# Patient Record
Sex: Male | Born: 1955 | Race: White | Hispanic: No | Smoking: Former smoker
Health system: Southern US, Community
[De-identification: ages and names within clinical notes are randomized; demographics above are authoritative.]

## PROBLEM LIST (undated history)

## (undated) DIAGNOSIS — I5022 Chronic systolic (congestive) heart failure: Secondary | ICD-10-CM

## (undated) DIAGNOSIS — J45909 Unspecified asthma, uncomplicated: Secondary | ICD-10-CM

## (undated) DIAGNOSIS — J449 Chronic obstructive pulmonary disease, unspecified: Secondary | ICD-10-CM

## (undated) DIAGNOSIS — I10 Essential (primary) hypertension: Secondary | ICD-10-CM

## (undated) DIAGNOSIS — I110 Hypertensive heart disease with heart failure: Secondary | ICD-10-CM

## (undated) DIAGNOSIS — I214 Non-ST elevation (NSTEMI) myocardial infarction: Secondary | ICD-10-CM

## (undated) HISTORY — PX: CATARACT EXTRACTION, BILATERAL: SHX1313

## (undated) HISTORY — DX: Chronic systolic (congestive) heart failure: I50.22

## (undated) HISTORY — DX: Hypertensive heart disease with heart failure: I11.0

---

## 2005-06-26 ENCOUNTER — Other Ambulatory Visit: Payer: Self-pay

## 2005-06-27 ENCOUNTER — Observation Stay: Payer: Self-pay

## 2015-06-21 ENCOUNTER — Inpatient Hospital Stay: Payer: Self-pay | Admitting: Anesthesiology

## 2015-06-21 ENCOUNTER — Encounter: Payer: Self-pay | Admitting: Intensive Care

## 2015-06-21 ENCOUNTER — Inpatient Hospital Stay
Admission: EM | Admit: 2015-06-21 | Discharge: 2015-06-23 | DRG: 378 | Disposition: A | Discharge status: Home / Self Care | Payer: Self-pay | Attending: Internal Medicine | Admitting: Internal Medicine

## 2015-06-21 ENCOUNTER — Encounter
Admission: EM | Disposition: A | Discharge status: Home / Self Care | Payer: Self-pay | Source: Home / Self Care | Attending: Internal Medicine

## 2015-06-21 ENCOUNTER — Emergency Department: Payer: Self-pay

## 2015-06-21 DIAGNOSIS — D6959 Other secondary thrombocytopenia: Secondary | ICD-10-CM | POA: Diagnosis present

## 2015-06-21 DIAGNOSIS — F1010 Alcohol abuse, uncomplicated: Secondary | ICD-10-CM

## 2015-06-21 DIAGNOSIS — K922 Gastrointestinal hemorrhage, unspecified: Secondary | ICD-10-CM | POA: Diagnosis present

## 2015-06-21 DIAGNOSIS — K2100 Gastro-esophageal reflux disease with esophagitis, without bleeding: Secondary | ICD-10-CM

## 2015-06-21 DIAGNOSIS — Z7952 Long term (current) use of systemic steroids: Secondary | ICD-10-CM

## 2015-06-21 DIAGNOSIS — D62 Acute posthemorrhagic anemia: Secondary | ICD-10-CM

## 2015-06-21 DIAGNOSIS — I10 Essential (primary) hypertension: Secondary | ICD-10-CM | POA: Diagnosis present

## 2015-06-21 DIAGNOSIS — Z87891 Personal history of nicotine dependence: Secondary | ICD-10-CM

## 2015-06-21 DIAGNOSIS — D696 Thrombocytopenia, unspecified: Secondary | ICD-10-CM

## 2015-06-21 DIAGNOSIS — Z885 Allergy status to narcotic agent status: Secondary | ICD-10-CM

## 2015-06-21 DIAGNOSIS — D500 Iron deficiency anemia secondary to blood loss (chronic): Secondary | ICD-10-CM | POA: Diagnosis present

## 2015-06-21 DIAGNOSIS — Z720 Tobacco use: Secondary | ICD-10-CM

## 2015-06-21 DIAGNOSIS — K2970 Gastritis, unspecified, without bleeding: Secondary | ICD-10-CM

## 2015-06-21 DIAGNOSIS — Z79899 Other long term (current) drug therapy: Secondary | ICD-10-CM

## 2015-06-21 DIAGNOSIS — K2971 Gastritis, unspecified, with bleeding: Secondary | ICD-10-CM | POA: Diagnosis present

## 2015-06-21 DIAGNOSIS — K264 Chronic or unspecified duodenal ulcer with hemorrhage: Principal | ICD-10-CM | POA: Diagnosis present

## 2015-06-21 DIAGNOSIS — J45909 Unspecified asthma, uncomplicated: Secondary | ICD-10-CM | POA: Diagnosis present

## 2015-06-21 DIAGNOSIS — K269 Duodenal ulcer, unspecified as acute or chronic, without hemorrhage or perforation: Secondary | ICD-10-CM

## 2015-06-21 DIAGNOSIS — D72829 Elevated white blood cell count, unspecified: Secondary | ICD-10-CM

## 2015-06-21 HISTORY — PX: ESOPHAGOGASTRODUODENOSCOPY (EGD) WITH PROPOFOL: SHX5813

## 2015-06-21 HISTORY — DX: Essential (primary) hypertension: I10

## 2015-06-21 HISTORY — DX: Unspecified asthma, uncomplicated: J45.909

## 2015-06-21 LAB — CBC
HEMATOCRIT: 37 % — AB (ref 40.0–52.0)
HEMATOCRIT: 32.7 % — AB (ref 40.0–52.0)
HEMOGLOBIN: 12.2 g/dL — AB (ref 13.0–18.0)
HEMOGLOBIN: 11 g/dL — AB (ref 13.0–18.0)
MCH: 31.1 pg (ref 26.0–34.0)
MCH: 31.4 pg (ref 26.0–34.0)
MCHC: 33.1 g/dL (ref 32.0–36.0)
MCHC: 33.6 g/dL (ref 32.0–36.0)
MCV: 94 fL (ref 80.0–100.0)
MCV: 93.4 fL (ref 80.0–100.0)
Platelets: 141 10*3/uL — ABNORMAL LOW (ref 150–440)
Platelets: 137 10*3/uL — ABNORMAL LOW (ref 150–440)
RBC: 3.94 MIL/uL — AB (ref 4.40–5.90)
RBC: 3.5 MIL/uL — ABNORMAL LOW (ref 4.40–5.90)
RDW: 14 % (ref 11.5–14.5)
RDW: 13.9 % (ref 11.5–14.5)
WBC: 10.4 10*3/uL (ref 3.8–10.6)
WBC: 8.6 10*3/uL (ref 3.8–10.6)

## 2015-06-21 LAB — COMPREHENSIVE METABOLIC PANEL
ALT: 26 U/L (ref 17–63)
AST: 50 U/L — ABNORMAL HIGH (ref 15–41)
Albumin: 3.3 g/dL — ABNORMAL LOW (ref 3.5–5.0)
Alkaline Phosphatase: 74 U/L (ref 38–126)
Anion gap: 16 — ABNORMAL HIGH (ref 5–15)
BUN: 23 mg/dL — AB (ref 6–20)
CHLORIDE: 95 mmol/L — AB (ref 101–111)
CO2: 24 mmol/L (ref 22–32)
Calcium: 8.4 mg/dL — ABNORMAL LOW (ref 8.9–10.3)
Creatinine, Ser: 0.65 mg/dL (ref 0.61–1.24)
GFR calc Af Amer: >60 mL/min (ref >60)
GFR calc non Af Amer: >60 mL/min (ref >60)
GLUCOSE: 128 mg/dL — AB (ref 65–99)
Potassium: 4.9 mmol/L (ref 3.5–5.1)
Sodium: 135 mmol/L (ref 135–145)
Total Bilirubin: 1 mg/dL (ref 0.3–1.2)
Total Protein: 6.5 g/dL (ref 6.5–8.1)

## 2015-06-21 LAB — TYPE AND SCREEN
ABO/RH(D): A NEG
ANTIBODY SCREEN: NEGATIVE

## 2015-06-21 LAB — URINALYSIS COMPLETE WITH MICROSCOPIC (ARMC ONLY)
BACTERIA UA: NONE SEEN
Bilirubin Urine: NEGATIVE
GLUCOSE, UA: NEGATIVE mg/dL
LEUKOCYTES UA: NEGATIVE
Nitrite: NEGATIVE
PH: 6 (ref 5.0–8.0)
Protein, ur: NEGATIVE mg/dL
SPECIFIC GRAVITY, URINE: 1.012 (ref 1.005–1.030)
SQUAMOUS EPITHELIAL / LPF: NONE SEEN

## 2015-06-21 LAB — PROTIME-INR
INR: 1.17
Prothrombin Time: 15.1 seconds — ABNORMAL HIGH (ref 11.4–15.0)

## 2015-06-21 LAB — CBC WITH DIFFERENTIAL/PLATELET
BASOS PCT: 0 %
Basophils Absolute: 0 10*3/uL (ref 0–0.1)
Basophils Absolute: 0 10*3/uL (ref 0–0.1)
Basophils Relative: 0 %
EOS ABS: 0 10*3/uL (ref 0–0.7)
EOS PCT: 0 %
Eosinophils Absolute: 0 10*3/uL (ref 0–0.7)
Eosinophils Relative: 0 %
HCT: 41.6 % (ref 40.0–52.0)
HEMATOCRIT: 36.6 % — AB (ref 40.0–52.0)
HEMOGLOBIN: 12.3 g/dL — AB (ref 13.0–18.0)
Hemoglobin: 13.8 g/dL (ref 13.0–18.0)
Lymphocytes Relative: 10 %
Lymphocytes Relative: 3 %
Lymphs Abs: 1.5 10*3/uL (ref 1.0–3.6)
Lymphs Abs: 0.4 10*3/uL — ABNORMAL LOW (ref 1.0–3.6)
MCH: 31.4 pg (ref 26.0–34.0)
MCH: 31.4 pg (ref 26.0–34.0)
MCHC: 33.2 g/dL (ref 32.0–36.0)
MCHC: 33.7 g/dL (ref 32.0–36.0)
MCV: 94.5 fL (ref 80.0–100.0)
MCV: 93.3 fL (ref 80.0–100.0)
MONOS PCT: 1 %
Monocytes Absolute: 0.7 10*3/uL (ref 0.2–1.0)
Monocytes Absolute: 0.2 10*3/uL (ref 0.2–1.0)
Monocytes Relative: 5 %
NEUTROS ABS: 12.2 10*3/uL — AB (ref 1.4–6.5)
NEUTROS PCT: 96 %
Neutro Abs: 12.5 10*3/uL — ABNORMAL HIGH (ref 1.4–6.5)
Neutrophils Relative %: 85 %
PLATELETS: 210 10*3/uL (ref 150–440)
Platelets: 155 10*3/uL (ref 150–440)
RBC: 4.41 MIL/uL (ref 4.40–5.90)
RBC: 3.93 MIL/uL — AB (ref 4.40–5.90)
RDW: 14.1 % (ref 11.5–14.5)
RDW: 14.2 % (ref 11.5–14.5)
WBC: 14.7 10*3/uL — ABNORMAL HIGH (ref 3.8–10.6)
WBC: 12.8 10*3/uL — AB (ref 3.8–10.6)

## 2015-06-21 LAB — MRSA PCR SCREENING: MRSA by PCR: NEGATIVE

## 2015-06-21 LAB — ETHANOL: ALCOHOL ETHYL (B): 82 mg/dL — AB (ref <5)

## 2015-06-21 LAB — ABO/RH: ABO/RH(D): A NEG

## 2015-06-21 LAB — LIPASE, BLOOD: LIPASE: 33 U/L (ref 11–51)

## 2015-06-21 LAB — GLUCOSE, CAPILLARY: GLUCOSE-CAPILLARY: 100 mg/dL — AB (ref 65–99)

## 2015-06-21 LAB — APTT: APTT: 30 s (ref 24–36)

## 2015-06-21 SURGERY — ESOPHAGOGASTRODUODENOSCOPY (EGD) WITH PROPOFOL
Anesthesia: General

## 2015-06-21 MED ORDER — ONDANSETRON HCL 4 MG/2ML IJ SOLN
INTRAMUSCULAR | Status: AC
  Administered 2015-06-21: 4 mg via INTRAVENOUS
  Filled 2015-06-21: qty 2

## 2015-06-21 MED ORDER — ALBUTEROL SULFATE (2.5 MG/3ML) 0.083% IN NEBU
3.0000 mL | INHALATION_SOLUTION | Freq: Two times a day (BID) | RESPIRATORY_TRACT | Status: DC
  Administered 2015-06-21 – 2015-06-22 (×4): 3 mL via RESPIRATORY_TRACT
  Filled 2015-06-21 (×5): qty 3

## 2015-06-21 MED ORDER — PANTOPRAZOLE SODIUM 40 MG IV SOLR
8.0000 mg/h | INTRAVENOUS | Status: DC
  Administered 2015-06-21 – 2015-06-22 (×2): 8 mg/h via INTRAVENOUS
  Filled 2015-06-21 (×3): qty 80

## 2015-06-21 MED ORDER — ONDANSETRON HCL 4 MG/2ML IJ SOLN
4.0000 mg | Freq: Once | INTRAMUSCULAR | Status: AC
  Administered 2015-06-21: 4 mg via INTRAVENOUS

## 2015-06-21 MED ORDER — SODIUM CHLORIDE 0.9 % IV BOLUS (SEPSIS)
1000.0000 mL | Freq: Once | INTRAVENOUS | Status: AC
  Administered 2015-06-21: 1000 mL via INTRAVENOUS

## 2015-06-21 MED ORDER — SODIUM CHLORIDE 0.9 % IV SOLN
8.0000 mg/h | INTRAVENOUS | Status: DC
  Filled 2015-06-21: qty 80

## 2015-06-21 MED ORDER — SODIUM CHLORIDE 0.9 % IV SOLN
80.0000 mg | Freq: Once | INTRAVENOUS | Status: AC
  Administered 2015-06-21: 80 mg via INTRAVENOUS
  Filled 2015-06-21: qty 80

## 2015-06-21 MED ORDER — FOLIC ACID 1 MG PO TABS
1.0000 mg | ORAL_TABLET | Freq: Two times a day (BID) | ORAL | Status: DC
  Administered 2015-06-21 – 2015-06-23 (×4): 1 mg via ORAL
  Filled 2015-06-21 (×4): qty 1

## 2015-06-21 MED ORDER — SODIUM CHLORIDE 0.9 % IV SOLN
INTRAVENOUS | Status: DC
  Administered 2015-06-21 – 2015-06-22 (×3): via INTRAVENOUS
  Administered 2015-06-22: 1000 mL via INTRAVENOUS
  Administered 2015-06-23: 06:00:00 via INTRAVENOUS

## 2015-06-21 MED ORDER — ONDANSETRON HCL 4 MG/2ML IJ SOLN
4.0000 mg | Freq: Once | INTRAMUSCULAR | Status: AC
  Administered 2015-06-21: 4 mg via INTRAVENOUS
  Filled 2015-06-21: qty 2

## 2015-06-21 MED ORDER — PROPOFOL 10 MG/ML IV BOLUS
INTRAVENOUS | Status: DC | PRN
  Administered 2015-06-21: 100 mg via INTRAVENOUS

## 2015-06-21 NOTE — ED Notes (Signed)
Patient [place]. Patient c/o vomiting up blood and black stools. Patient A&O X4. Patient also states chest hurts when coughing

## 2015-06-21 NOTE — ED Notes (Signed)
MD at bedside for rectal exam. Patient positive hemoccult

## 2015-06-21 NOTE — Op Note (Signed)
[facility] Gastroenterology Patient Name: Caleb Carpenter Procedure Date: 06/21/2015 1:19 PM MRN: [id number] Account #: [id number] Date of Birth: 03/07/1956 Admit Type: Inpatient Age: 60 Room: [facility] ENDO ROOM 4 Gender: Male Note Status: Finalized Procedure:            Upper GI endoscopy Indications:          Hematochezia Providers:            Midge Minium, MD Referring MD:         No Local Md, MD (Referring MD) Medicines:            Propofol per Anesthesia Complications:        No immediate complications. Procedure:            Pre-Anesthesia Assessment:                       - Prior to the procedure, a History and Physical was                        performed, and patient medications and allergies were                        reviewed. The patient's tolerance of previous                        anesthesia was also reviewed. The risks and benefits of                        the procedure and the sedation options and risks were                        discussed with the patient. All questions were                        answered, and informed consent was obtained. Prior                        Anticoagulants: The patient has taken no previous                        anticoagulant or antiplatelet agents. ASA Grade                        Assessment: II - A patient with mild systemic disease.                        After reviewing the risks and benefits, the patient was                        deemed in satisfactory condition to undergo the                        procedure.                       After obtaining informed consent, the endoscope was                        passed under direct vision. Throughout the procedure,  the patient's blood pressure, pulse, and oxygen                        saturations were monitored continuously. The Endoscope                        was introduced through the mouth, and advanced to the                        second part of  duodenum. The upper GI endoscopy was                        accomplished without difficulty. The patient tolerated                        the procedure well. Findings:      LA Grade D (one or more mucosal breaks involving at least 75% of       esophageal circumference) esophagitis with no bleeding was found in the       lower third of the esophagus.      Localized moderate inflammation characterized by erythema was found in       the gastric antrum. Biopsies were taken with a cold forceps for       histology.      Two non-bleeding cratered duodenal ulcers with no stigmata of bleeding       were found in the first portion of the duodenum. Impression:           - LA Grade D reflux esophagitis.                       - Gastritis. Biopsied.                       - Multiple non-bleeding duodenal ulcers with no                        stigmata of bleeding. Recommendation:       - Await pathology results. Procedure Code(s):    --- Professional ---                       [phone], Esophagogastroduodenoscopy, flexible, transoral;                        with biopsy, single or multiple Diagnosis Code(s):    --- Professional ---                       K92.1, Melena (includes Hematochezia)                       K21.0, Gastro-esophageal reflux disease with esophagitis                       K29.70, Gastritis, unspecified, without bleeding                       K26.9, Duodenal ulcer, unspecified as acute or chronic,                        without hemorrhage or perforation CPT copyright 2016 American Medical Association. All rights reserved. The codes documented in this  report are preliminary and upon coder review may  be revised to meet current compliance requirements. Midge Minium, MD 06/21/2015 1:33:16 PM This report has been signed electronically. Number of Addenda: 0 Note Initiated On: 06/21/2015 1:19 PM      [facility]

## 2015-06-21 NOTE — Transfer of Care (Signed)
Immediate Anesthesia Transfer of Care Note  Patient: Caleb Carpenter  Procedure(s) Performed: Procedure(s): ESOPHAGOGASTRODUODENOSCOPY (EGD) WITH PROPOFOL (N/A)  Patient Location: PACU and Endoscopy Unit  Anesthesia Type:General  Level of Consciousness: patient cooperative and lethargic  Airway & Oxygen Therapy: Patient Spontanous Breathing and Patient connected to nasal cannula oxygen  Post-op Assessment: Report given to RN and Post -op Vital signs reviewed and stable  Post vital signs: Reviewed  Last Vitals:  Filed Vitals:   06/21/15 1337 06/21/15 1338  BP: 100/65 100/65  Pulse: 125   Temp: 36.9 C 36.8 C  Resp: 20 22    Complications: No apparent anesthesia complications

## 2015-06-21 NOTE — Anesthesia Preprocedure Evaluation (Addendum)
Anesthesia Evaluation  Patient identified by MRN, date of birth, ID band Patient awake    Reviewed: Allergy & Precautions, NPO status , Patient's Chart, lab work & pertinent test results, reviewed documented beta blocker date and time   Airway Mallampati: II  TM Distance: >3 FB     Dental  (+) Chipped, Edentulous Upper, Edentulous Lower   Pulmonary asthma , former smoker,           Cardiovascular hypertension, Pt. on medications and Pt. on home beta blockers      Neuro/Psych    GI/Hepatic   Endo/Other    Renal/GU      Musculoskeletal   Abdominal   Peds  Hematology   Anesthesia Other Findings CXR shows COPD. EKG poor R wave progression. OK.  Reproductive/Obstetrics                         Anesthesia Physical Anesthesia Plan  ASA: III  Anesthesia Plan: General   Post-op Pain Management:    Induction: Intravenous  Airway Management Planned: Nasal Cannula  Additional Equipment:   Intra-op Plan:   Post-operative Plan:   Informed Consent: I have reviewed the patients History and Physical, chart, labs and discussed the procedure including the risks, benefits and alternatives for the proposed anesthesia with the patient or authorized representative who has indicated his/her understanding and acceptance.     Plan Discussed with: CRNA  Anesthesia Plan Comments:         Anesthesia Quick Evaluation

## 2015-06-21 NOTE — ED Provider Notes (Signed)
[facility] Emergency Department Provider Note  ____________________________________________  Time seen: Approximately 7:27 AM  I have reviewed the triage vital signs and the nursing notes.   HISTORY  Chief Complaint Emesis    HPI Caleb Carpenter is a 60 y.o. male hypertension and asthma who presents for evaluation of bright red hematemesis and black tarry stools today, sudden onset, constant since onset, currently moderate. Patient reports that twice this morning he has vomited up bright red blood. He denies any abdominal pain but has some chest pain with cough. He reports he has been coughing "for a long time". No fevers. No shortness of breath. He is not chronically anticoagulated. No history of GI bleed. He does drink alcohol daily but denies any history of complicated withdrawals. He last drink alcohol at dinner last night.   Past Medical History  Diagnosis Date  . Hypertension   . Asthma     There are no active problems to display for this patient.   History reviewed. No pertinent past surgical history.  No current outpatient prescriptions on file.  Allergies Codeine  History reviewed. No pertinent family history.  Social History Social History  Substance Use Topics  . Smoking status: Former Smoker    Types: Cigarettes  . Smokeless tobacco: Current User    Types: Chew  . Alcohol Use: 1.2 oz/week    2 Cans of beer per week     Comment: per day    Review of Systems Constitutional: No fever/chills Eyes: No visual changes. ENT: No sore throat. Cardiovascular: + chest pain with cough. Respiratory: Denies shortness of breath. Gastrointestinal: No abdominal pain.  No nausea, no vomiting.  No diarrhea.  No constipation. Genitourinary: Negative for dysuria. Musculoskeletal: Negative for back pain. Skin: Negative for rash. Neurological: Negative for headaches, focal weakness or numbness.  10-point ROS otherwise  negative.  ____________________________________________   PHYSICAL EXAM:  Filed Vitals:   06/21/15 0730 06/21/15 0747 06/21/15 0800 06/21/15 0830  BP: 105/93 105/93 123/91 132/93  Pulse: 137 126  113  Temp:      TempSrc:      Resp: Weight:      SpO2: 94%   96%     Constitutional: Alert and oriented. Nontoxic appearing and in no acute distress. Eyes: Conjunctivae are normal. PERRL. EOMI. Head: Atraumatic. Nose: No congestion/rhinnorhea. Mouth/Throat: Mucous membranes are moist.  Oropharynx non-erythematous. Neck: No stridor. Supple without meningismus. Cardiovascular: Tachycardic rate, regular rhythm. Grossly normal heart sounds.  Good peripheral circulation. Respiratory: Normal respiratory effort.  No retractions. Lungs CTAB. Gastrointestinal: Soft and nontender. No distention.  No CVA tenderness. Rectal: Melena in the rectal vault is strongly guaiac positive. Musculoskeletal: No lower extremity tenderness nor edema.  No joint effusions. Neurologic:  Normal speech and language. No gross focal neurologic deficits are appreciated.  Skin:  Skin is warm, dry and intact. No rash noted. Psychiatric: Mood and affect are normal. Speech and behavior are normal.  ____________________________________________   LABS (all labs ordered are listed, but only abnormal results are displayed)  Labs Reviewed  CBC WITH DIFFERENTIAL/PLATELET - Abnormal; Notable for the following:    WBC 14.7 (*)    Neutro Abs 12.5 (*)    All other components within normal limits  COMPREHENSIVE METABOLIC PANEL - Abnormal; Notable for the following:    Chloride 95 (*)    Glucose, Bld 128 (*)    BUN 23 (*)    Calcium 8.4 (*)    Albumin 3.3 (*)  AST 50 (*)    Anion gap 16 (*)    All other components within normal limits  PROTIME-INR - Abnormal; Notable for the following:    Prothrombin Time 15.1 (*)    All other components within normal limits  ETHANOL - Abnormal; Notable for the following:     Alcohol, Ethyl (B) 82 (*)    All other components within normal limits  LIPASE, BLOOD  APTT  URINALYSIS COMPLETEWITH MICROSCOPIC ([facility] ONLY)  TYPE AND SCREEN  ABO/RH   ____________________________________________  EKG  ED ECG REPORT I, Gayla Doss, the attending physician, personally viewed and interpreted this ECG.   Date: 06/21/2015  EKG Time: 07:36  Rate: 129  Rhythm: sinus tachycardia  Axis: normal  Intervals:none  ST&T Change: No acute ST elevation. Q-wave in anteroseptal leads.  ____________________________________________  RADIOLOGY  CXR IMPRESSION: COPD. No active disease.  ____________________________________________   PROCEDURES  Procedure(s) performed: None  Critical Care performed: No  ____________________________________________   INITIAL IMPRESSION / ASSESSMENT AND PLAN / ED COURSE  Pertinent labs & imaging results that were available during my care of the patient were reviewed by me and considered in my medical decision making (see chart for details).  [name] is a 60 y.o. male hypertension and asthma who presents for evaluation of bright red hematemesis and black tarry stools today. On exam, he is nontoxic appearing. Vital signs are markable for tachycardia with initial heart rate of 142 bpm. Maintaining adequate blood pressure. Afebrile. Abdomen benign. He does have melena in the rectal vault which is guaiac positive. Suspect upper GI bleed. We'll give IV fluids, antiemetics, Protonix, obtain screening labs, chest x-ray and anticipate admission.  ----------------------------------------- 8:52 AM on 06/21/2015 ----------------------------------------- Labs reviewed. White blood cell count elevated at 14.7. Hemoglobin stable at 13.8 however the patient has vomited 3-400 cc of bright red blood with clots in my presence concerning for ongoing upper GI bleed. CMP with slightly widened anion gap however suspect that may be due to  dehydration, we'll give fluids. Normal lipase. Ethanol level 82. Chest x-ray clear. Heart rate improved to 110 bpm at this time. Case discussed with the hospitalist, Dr. Hilton Sinclair, for admission.  ____________________________________________   FINAL CLINICAL IMPRESSION(S) / ED DIAGNOSES  Final diagnoses:  Acute upper GI bleed      Gayla Doss, MD 06/21/15 [phone]

## 2015-06-21 NOTE — Progress Notes (Signed)
LCSW met with patient and had a theraputic discussion. He reports he is a daily drinker but lacks the insight to understand that over 45 years of continuous drinking, the body develops weak spots in his stomack lining and that not eating also contributes. It was discussed that the patient has lived in [place] and [place] and [place]. He reports he doesn't do much eating on a regular day and may only have beer once a day. [place] with [place] so it wasn't that bad. He reports that his Grandmother was native and he is part native as well. He doesn't care to watch TV or electronics, and doesn't eat a lot.  LCSW completed assessment Elener Custodio Pauletta Browns   [phone] He is agreeable to have a list of resources and boarding house handouts. He received SSDI

## 2015-06-21 NOTE — OR Nursing (Signed)
500 cc's ns 0n arrival@100cc 'ser hour

## 2015-06-21 NOTE — Clinical Social Work Note (Signed)
Brother Clinical Social Work Assessment  Patient Details  Name: Caleb Carpenter MRN: [id number] Date of Birth: 1955/12/10  Date of referral:  06/21/15               Reason for consult:  Housing Concerns/Homelessness, Emotional/Coping/Adjustment to Illness, Discharge Planning, [employer], Substance Use/ETOH Abuse                Permission sought to share information with:  Family Supports Permission granted to share information::  Yes, Verbal Permission Granted  Name::     Breckin Savannah and Ferdinand Lango wife  Agency::  No  Relationship::  Can speak to his sister in Sports coach and brother Presenter, broadcasting Information:  Yes Pt unable to remember number  Housing/Transportation Living arrangements for the past 2 months:  No permanent address Source of Information:  Patient Patient Interpreter Needed:  None Criminal Activity/Legal Involvement Pertinent to Current Situation/Hospitalization:  No - Comment as needed Significant Relationships:  Siblings Lives with:   Tyrone Apple and sister in [place] Do you feel safe going back to the place where you live?  Yes Need for family participation in patient care:  No (Coment) patient is self reliant and reports he doesn't need anyone  Care giving concerns: Patient appears to be in denial about his ETOH issues,even though he has upper GI bleed. He is agreeable to a soft food diet and indicated he was hungry.   Social Worker assessment / plan:  LCSW met with patient. He reported he was staying with his sister until she passed away and that they were close, he said she died recently but couldn't remember when but that his niece drove him from [facility] to his brothers house . He has 4 sisters and 1 brother, other siblings have died. Before staying with sister [place] and had a [place]. Patient reports he is not worried about where he will stay as he received SSI 730.00 month. He reports he is not incontinent and  can walk but lately his knee is a little grumpy. He stated he is not homicidal or suicidal. He denies having a drinking problem and lacks insight about alcohol issues. He is agreeable to receive information on rental and boarding houses and [facility]. LCSW will engage with patient tomorrow and ensure that this patients basic life needs will be met once discharged medication, food, shelter and self care. Employment status:  Disabled (Comment on whether or not currently receiving Disability) (SSI- 730.00 month) Insurance information:  Medicaid In [place] PT Recommendations:  Not assessed at this time Information / Referral to community resources:  Other (Comment Required) (Housing and day program out patient treamtnet center)  Patient/Family's Response to care:  No contact  Patient/Family's Understanding of and Emotional Response to Diagnosis, Current Treatment, and Prognosis:  No insight but agreeable to resources  Emotional Assessment Appearance:  Appears older than stated age, Disheveled Attitude/Demeanor/Rapport:  Avoidant, Apprehensive Affect (typically observed):  Guarded, Calm, Pleasant, Quiet, Accepting, Adaptable Orientation:  Oriented to Self, Oriented to Place, Oriented to  Time, Oriented to Situation, Fluctuating Orientation (Suspected and/or reported Sundowners) Alcohol / Substance use:  Alcohol Use Psych involvement (Current and /or in the community):  No (Comment)  Discharge Needs  Concerns to be addressed:  Substance Abuse Concerns, Denies Needs/Concerns at this time, Compliance Issues Concerns, Homelessness, Basic Needs, Discharge Planning Concerns (Patient isnt concerned he may not be able to return  to his brothers house, he doesnt seem to work and repots he gets by with SSI check 730.00 month) Readmission within the last 30 days:  No Current discharge risk:  Substance Abuse Barriers to Discharge:  Active Substance Use   Joana Reamer, LCSW 06/21/2015, 5:15  PM

## 2015-06-21 NOTE — Anesthesia Postprocedure Evaluation (Signed)
Anesthesia Post Note  Patient: Caleb Carpenter  Procedure(s) Performed: Procedure(s) (LRB): ESOPHAGOGASTRODUODENOSCOPY (EGD) WITH PROPOFOL (N/A)  Patient location during evaluation: Endoscopy Anesthesia Type: General Level of consciousness: awake and alert Pain management: pain level controlled Vital Signs Assessment: post-procedure vital signs reviewed and stable Respiratory status: spontaneous breathing, nonlabored ventilation, respiratory function stable and patient connected to nasal cannula oxygen Cardiovascular status: blood pressure returned to baseline and stable Postop Assessment: no signs of nausea or vomiting Anesthetic complications: no    Last Vitals:  Filed Vitals:   06/21/15 1350 06/21/15 1400  BP: 131/76 125/80  Pulse: 120 103  Temp:    Resp: 22 14    Last Pain:  Filed Vitals:   06/21/15 1402  PainSc: 1                  [name]

## 2015-06-21 NOTE — Progress Notes (Signed)
CRITICAL VALUE ALERT  Critical value received:  5 beat run VT from central telemetry monitoring tech  Date of notification:  06/21/2015  Time of notification:  2153  Critical value read back: yes  Nurse who received alert:  Carma Lair RN  MD notified (1st page): n/a  Time of first page:  2206  MD notified (2nd page): Dr. Anne Hahn  Time of second page:  2218  Responding MD:  Dr. Anne Hahn     Time MD responded:  2221 Notified that central tele monitoring tech called to report pt had a 5 beat run of VT. Pt has been SR-ST (80s to 110s ) on the monitor. Has been coughing (nonproductive, and occasionally restless), pt is asymptomatic.  MD asked to continue monitoring patient.

## 2015-06-21 NOTE — H&P (Signed)
[facility] - [place] at [facility]   PATIENT NAME: Caleb Carpenter    MR#:  [id number]  DATE OF BIRTH:  12-10-1955  DATE OF ADMISSION:  06/21/2015  PRIMARY CARE PHYSICIAN: No PCP Per Patient   REQUESTING/REFERRING PHYSICIAN: DR/Eryka Inocencio Homes  CHIEF COMPLAINT:   Chief Complaint  Patient presents with  . Emesis    HISTORY OF PRESENT ILLNESS:  Caleb Carpenter  is a 60 y.o. male with a known history of  Htn,asthma,Alcohol abuse came in with  coffee-ground vomiting started yesterday. No epigastric pain. Denies any use  of NSAID is on regular basis. Vomited about  100 mL of coffee-ground vomiting in the ER 2 times since the this morning at 7:30. No globin stable at 13.8 but he is tachycardic up to heart rate 1 20 bpm. Some Cough present. No shortness of breath..no dizziness.  PAST MEDICAL HISTORY:   Past Medical History  Diagnosis Date  . Hypertension   . Asthma     PAST SURGICAL HISTOIRY:  History reviewed. No pertinent past surgical history. No h/o surgeries SOCIAL HISTORY:   Social History  Substance Use Topics  . Smoking status: Former Smoker    Types: Cigarettes  . Smokeless tobacco: Current User    Types: Chew  . Alcohol Use: 1.2 oz/week    2 Cans of beer per week     Comment: per day    FAMILY HISTORY:  History reviewed. No pertinent family history.  suster has diabetes, hypertension.  DRUG ALLERGIES:   Allergies  Allergen Reactions  . Codeine    codeine gives nausea, REVIEW OF SYSTEMS:  CONSTITUTIONAL: No fever, fatigue or weakness. unkempt. EYES: No blurred or double vision.  EARS, NOSE, AND THROAT: No tinnitus or ear pain.  RESPIRATORY: No cough, shortness of breath, wheezing or hemoptysis.  CARDIOVASCULAR: No chest pain, orthopnea, edema.  GASTROINTESTINAL: No nausea, is been having coffee-ground vomiting since yesterday. No epigastric pain. GENITOURINARY: No dysuria, hematuria.  ENDOCRINE: No polyuria, nocturia,  HEMATOLOGY: No  anemia, easy bruising or bleeding SKIN: No rash or lesion. MUSCULOSKELETAL: No joint pain or arthritis.   NEUROLOGIC: No tingling, numbness, weakness.  PSYCHIATRY: No anxiety or depression.   MEDICATIONS AT HOME:   Prior to Admission medications   Medication Sig Start Date End Date Taking? Authorizing Provider  albuterol (PROVENTIL HFA;VENTOLIN HFA) 108 (90 Base) MCG/ACT inhaler Inhale 2 puffs into the lungs 2 (two) times daily.   Yes Historical Provider, MD  folic acid (FOLVITE) 1 MG tablet Take 1 mg by mouth 2 (two) times daily.   Yes Historical Provider, MD  lisinopril (PRINIVIL,ZESTRIL) 20 MG tablet Take 20 mg by mouth daily.   Yes Historical Provider, MD  metoprolol tartrate (LOPRESSOR) 25 MG tablet Take 25 mg by mouth 2 (two) times daily.   Yes Historical Provider, MD      VITAL SIGNS:  Blood pressure 141/85, pulse 127, temperature 98.1 F (36.7 C), temperature source Oral, resp. rate 12, weight 43.1 kg (95 lb 0.3 oz), SpO2 97 %.  PHYSICAL EXAMINATION:  GENERAL:  60 y.o.-year-old patient lying in the bed with no acute distress. Poorly groomed and unkempt.  EYES: Pupils equal, round, reactive to light and accommodation. No scleral icterus. Extraocular muscles intact.  HEENT: Head atraumatic, normocephalic. Oropharynx and nasopharynx clear.  NECK:  Supple, no jugular venous distention. No thyroid enlargement, no tenderness.  LUNGS: Normal breath sounds bilaterally, no wheezing, rales,rhonchi or crepitation. No use of accessory muscles of respiration.  CARDIOVASCULAR: S1, S2  normal. No murmurs, rubs, or gallops.  ABDOMEN: Soft, nontender, nondistended. Bowel sounds present. No organomegaly or mass.  EXTREMITIES: No pedal edema, cyanosis, or clubbing.  NEUROLOGIC: Cranial nerves II through XII are intact. Muscle strength 5/5 in all extremities. Sensation intact. Gait not checked.  PSYCHIATRIC: The patient is alert and oriented x 3.  SKIN: No obvious rash, lesion, or ulcer.    LABORATORY PANEL:   CBC  Recent Labs Lab 06/21/15 0959  WBC 12.8*  HGB 12.3*  HCT 36.6*  PLT 155   ------------------------------------------------------------------------------------------------------------------  Chemistries   Recent Labs Lab 06/21/15 0724  NA 135  K 4.9  CL 95*  CO2 24  GLUCOSE 128*  BUN 23*  CREATININE 0.65  CALCIUM 8.4*  AST 50*  ALT 26  ALKPHOS 74  BILITOT 1.0   ------------------------------------------------------------------------------------------------------------------  Cardiac Enzymes No results for input(s): TROPONINI in the last 168 hours. ------------------------------------------------------------------------------------------------------------------  RADIOLOGY:  Dg Chest Portable 1 View  06/21/2015  CLINICAL DATA:  Vomiting of blood since this morning.  Chest pain. EXAM: PORTABLE CHEST 1 VIEW COMPARISON:  06/26/2005 FINDINGS: There is hyperinflation of the lungs compatible with COPD. Lucency over the left lung base, likely moderate hiatal hernia. Heart is normal size. No confluent airspace opacities or effusions. No acute bony abnormality. IMPRESSION: COPD.  No active disease. Electronically Signed   By: Charlett Nose M.D.   On: 06/21/2015 08:23    EKG:   Orders placed or performed during the hospital encounter of 06/21/15  . ED EKG  . ED EKG  . EKG 12-Lead  . EKG 12-Lead   tachycardic with heart rate 1 20 bpm no ST-T changes.  IMPRESSION AND PLAN:  74. 60 year old male patient with alcohol abuse comes in because of coffee-ground vomiting, melena. Likely source is upper GI bleed. Admit him to ICU stepdown, start him on Protonix drip, check CBC every 6 hours, continue aggressive hydration, GI consult.  I spoke with Dr. Darlina Rumpf. Keep him nothing by mouth. For possible upper endoscopy today.  #2 history of asthma: Patient right now has no wheezing. Use inhalers. #3 hypertension: Patient blood pressure  is soft .Because of GI  bleed hold BP medications. 4. leukocytosis likely stress induced: Monitor closely.  All the records are reviewed and case discussed with ED provider. Management plans discussed with the patient, family and they are in agreement.  CODE STATUS: full  TOTAL TIME TAKING CARE OF THIS PATIENT: 55 minutes.    Katha Hamming M.D on 06/21/2015 at 10:23 AM  Between 7am to 6pm - Pager - [phone]  After 6pm go to [email or web address] - password [email or web address] [facility]  [place] [facility]  Office  [phone]  CC: Primary care physician; No PCP Per Patient  Note: This dictation was prepared with Dragon dictation along with smaller phrase technology. Any transcriptional errors that result from this process are unintentional.

## 2015-06-22 LAB — HEMOGLOBIN: Hemoglobin: 10.4 g/dL — ABNORMAL LOW (ref 13.0–18.0)

## 2015-06-22 LAB — CBC
HEMATOCRIT: 32.2 % — AB (ref 40.0–52.0)
HEMOGLOBIN: 10.8 g/dL — AB (ref 13.0–18.0)
MCH: 31.4 pg (ref 26.0–34.0)
MCHC: 33.5 g/dL (ref 32.0–36.0)
MCV: 93.6 fL (ref 80.0–100.0)
Platelets: 118 10*3/uL — ABNORMAL LOW (ref 150–440)
RBC: 3.44 MIL/uL — AB (ref 4.40–5.90)
RDW: 14.2 % (ref 11.5–14.5)
WBC: 6.7 10*3/uL (ref 3.8–10.6)

## 2015-06-22 LAB — MAGNESIUM: Magnesium: 1.3 mg/dL — ABNORMAL LOW (ref 1.7–2.4)

## 2015-06-22 LAB — SURGICAL PATHOLOGY

## 2015-06-22 MED ORDER — PANTOPRAZOLE SODIUM 40 MG IV SOLR
40.0000 mg | Freq: Two times a day (BID) | INTRAVENOUS | Status: DC
  Administered 2015-06-22 – 2015-06-23 (×2): 40 mg via INTRAVENOUS
  Filled 2015-06-22 (×2): qty 40

## 2015-06-22 MED ORDER — ALBUTEROL SULFATE (2.5 MG/3ML) 0.083% IN NEBU: 3.0000 mL | INHALATION_SOLUTION | Freq: Two times a day (BID) | RESPIRATORY_TRACT | Status: DC

## 2015-06-22 MED ORDER — MAGNESIUM SULFATE 4 GM/100ML IV SOLN
4.0000 g | Freq: Two times a day (BID) | INTRAVENOUS | Status: AC
  Administered 2015-06-22: 4 g via INTRAVENOUS
  Filled 2015-06-22 (×2): qty 100

## 2015-06-22 NOTE — Progress Notes (Signed)
LCSW met with patient and reviewed supports and shelters and rooms/boarding houses and provided several treatment options both inpatient and out patient treatment for substance abuse ( separate envelope) Patient  relayed he can see but CANT read. He gave this worker verbal consent to speak to his brother Collier [place] or his sister in [employer]. [employer] LCSW [phone]

## 2015-06-22 NOTE — Progress Notes (Signed)
LCSW went to greet patient after his room change. He was told that I couldn't reach his brother. He reported he has kept all the information I gave him and would be fine. LCSW wishes him well and encouraged him to think and access the [facility] program once released. No further needs identified  [employer] LCSW  [phone]

## 2015-06-22 NOTE — Progress Notes (Signed)
LCSW called number [phone] and not able to get through due to network difficulties.  [employer] LCSW [phone]

## 2015-06-22 NOTE — Progress Notes (Signed)
Telephone report called to Clydie Braun, Charity fundraiser. Patient assigned to room 202.

## 2015-06-22 NOTE — Progress Notes (Signed)
Received patient from CCU, patient is alert and oriented times three, denies pain at this time. Patient has telemetry on and heart rate is within normal limits sinus rhythm. Central monitoring called and aware that patient has been transferred from the unit.

## 2015-06-22 NOTE — Progress Notes (Signed)
Central tele monitoring tech called to obtain order for tele monitoring. Called Dr. Anne Hahn to get order. Order in EPIC.

## 2015-06-22 NOTE — Progress Notes (Signed)
LCSW has created a Manufacturing engineer for patient and will deliver it shortly.  [employer] LCSW [phone]

## 2015-06-22 NOTE — Progress Notes (Signed)
[facility] - [place] at [facility]   PATIENT NAME: Caleb Carpenter    MR#:  [id number]  DATE OF BIRTH:  11/18/1955  SUBJECTIVE:  CHIEF COMPLAINT:   Chief Complaint  Patient presents with  . Emesis   Patient is 60 year old Caucasian male with past medical history significant history of alcohol abuse who presents to the hospital with complaints of melanotic stool, coffee-ground emesis. Unable to the hospital patient was noted to be tachycardic. His hemoglobin level was found to be 12.3, decreased to 10.8 by today. Continues to have black looking stool. Hemoglobin remained stable. Developed an episode of a 5 beat tachycardia, resolved now. Magnesium level was found to be low. Patient had the EGD yesterday revealing gastritis and duodenal ulcers, no bleeding. Continues to have upper abdominal discomfort, especially on palpation Review of Systems  Constitutional: Negative for fever, chills and weight loss.  HENT: Negative for congestion.   Eyes: Negative for blurred vision and double vision.  Respiratory: Negative for cough, sputum production, shortness of breath and wheezing.   Cardiovascular: Negative for chest pain, palpitations, orthopnea, leg swelling and PND.  Gastrointestinal: Positive for abdominal pain. Negative for nausea, vomiting, diarrhea, constipation and blood in stool.  Genitourinary: Negative for dysuria, urgency, frequency and hematuria.  Musculoskeletal: Negative for falls.  Neurological: Negative for dizziness, tremors, focal weakness and headaches.  Endo/Heme/Allergies: Does not bruise/bleed easily.  Psychiatric/Behavioral: Negative for depression. The patient does not have insomnia.     VITAL SIGNS: Blood pressure 129/72, pulse 76, temperature 98.9 F (37.2 C), temperature source Oral, resp. rate 18, height  (1.651 m), weight 43.1 kg (95 lb 0.3 oz), SpO2 98 %.  PHYSICAL EXAMINATION:   GENERAL:  60 y.o.-year-old patient lying in the bed with  no acute distress.  EYES: Pupils equal, round, reactive to light and accommodation. No scleral icterus. Extraocular muscles intact.  HEENT: Head atraumatic, normocephalic. Oropharynx and nasopharynx clear.  NECK:  Supple, no jugular venous distention. No thyroid enlargement, no tenderness.  LUNGS: Normal breath sounds bilaterally, no wheezing, rales,rhonchi or crepitation. No use of accessory muscles of respiration.  CARDIOVASCULAR: S1, S2 normal. No murmurs, rubs, or gallops.  ABDOMEN: Soft, tender in upper abdomen, voluntary guarding, no rebound, nondistended. Bowel sounds present. No organomegaly or mass.  EXTREMITIES: No pedal edema, cyanosis, or clubbing.  NEUROLOGIC: Cranial nerves II through XII are intact. Muscle strength 5/5 in all extremities. Sensation intact. Gait not checked.  PSYCHIATRIC: The patient is alert and oriented x 3.  SKIN: No obvious rash, lesion, or ulcer.   ORDERS/RESULTS REVIEWED:   CBC  Recent Labs Lab 06/21/15 0724 06/21/15 0959 06/21/15 1615 06/21/15 2123 06/22/15 0414  WBC 14.7* 12.8* 10.4 8.6 6.7  HGB 13.8 12.3* 12.2* 11.0* 10.8*  HCT 41.6 36.6* 37.0* 32.7* 32.2*  PLT 210 155 141* 137* 118*  MCV 94.5 93.3 94.0 93.4 93.6  MCH 31.4 31.4 31.1 31.4 31.4  MCHC 33.2 33.7 33.1 33.6 33.5  RDW 14.1 14.2 14.0 13.9 14.2  LYMPHSABS 1.5 0.4*  --   --   --   MONOABS 0.7 0.2  --   --   --   EOSABS 0.0 0.0  --   --   --   BASOSABS 0.0 0.0  --   --   --    ------------------------------------------------------------------------------------------------------------------  Chemistries   Recent Labs Lab 06/21/15 0724 06/22/15 0414  NA 135  --   K 4.9  --   CL 95*  --   CO2  24  --   GLUCOSE 128*  --   BUN 23*  --   CREATININE 0.65  --   CALCIUM 8.4*  --   MG  --  1.3*  AST 50*  --   ALT 26  --   ALKPHOS 74  --   BILITOT 1.0  --     ------------------------------------------------------------------------------------------------------------------ estimated creatinine clearance is 59.1 mL/min (by C-G formula based on Cr of 0.65). ------------------------------------------------------------------------------------------------------------------ No results for input(s): TSH, T4TOTAL, T3FREE, THYROIDAB in the last 72 hours.  Invalid input(s): FREET3  Cardiac Enzymes No results for input(s): CKMB, TROPONINI, MYOGLOBIN in the last 168 hours.  Invalid input(s): CK ------------------------------------------------------------------------------------------------------------------ Invalid input(s): POCBNP ---------------------------------------------------------------------------------------------------------------  RADIOLOGY: Dg Chest Portable 1 View  06/21/2015  CLINICAL DATA:  Vomiting of blood since this morning.  Chest pain. EXAM: PORTABLE CHEST 1 VIEW COMPARISON:  06/26/2005 FINDINGS: There is hyperinflation of the lungs compatible with COPD. Lucency over the left lung base, likely moderate hiatal hernia. Heart is normal size. No confluent airspace opacities or effusions. No acute bony abnormality. IMPRESSION: COPD.  No active disease. Electronically Signed   By: Charlett Nose M.D.   On: 06/21/2015 08:23    EKG:  Orders placed or performed during the hospital encounter of 06/21/15  . ED EKG  . ED EKG  . EKG 12-Lead  . EKG 12-Lead    ASSESSMENT AND PLAN:  Active Problems:   Upper GI bleed   Reflux esophagitis   Gastritis   Duodenal ulceration  #1. Upper GI bleed, due to gastritis and duodenal ulcers, seems to be resolved, although patient continues to have black stool, check a hemoglobin level again, continue IV fluids, nothing by mouth, PPI infusion, appreciate gastroenterology's input #2. Gastritis, duodenal ulcers, questionable alcohol-related, no nonsteroidal anti-inflammatory medications reported per patient,  continue PPIs, get H. pylori testing in stool #3 posthemorrhagic anemia, acute, continue IV fluids, follow hemoglobin level closely, transfuse as needed #4. Hypomagnesemia, supplementing it intravenously #5. Leukocytosis, resolved with therapy. #6, thrombocytopenia, likely consumption, versus related to alcohol abuse. Follow in the morning. #7, alcohol abuse, whach for withdrawal   Management plans discussed with the patient, family and they are in agreement.   DRUG ALLERGIES:  Allergies  Allergen Reactions  . Codeine     CODE STATUS:     Code Status Orders        Start     Ordered   06/21/15 0900  Full code   Continuous     06/21/15 0901    Code Status History    Date Active Date Inactive Code Status Order ID Comments User Context   This patient has a current code status but no historical code status.      TOTAL TIME TAKING CARE OF THIS PATIENT: 40 minutes.    Katharina Caper M.D on 06/22/2015 at 2:10 PM  Between 7am to 6pm - Pager - [phone]  After 6pm go to [email or web address] - password [email or web address] [facility] [place]  [place]  [facility]  Office  [phone]  CC: Primary care physician; No PCP Per Patient

## 2015-06-23 ENCOUNTER — Encounter: Payer: Self-pay | Admitting: Gastroenterology

## 2015-06-23 DIAGNOSIS — D62 Acute posthemorrhagic anemia: Secondary | ICD-10-CM

## 2015-06-23 DIAGNOSIS — D72829 Elevated white blood cell count, unspecified: Secondary | ICD-10-CM

## 2015-06-23 DIAGNOSIS — F1010 Alcohol abuse, uncomplicated: Secondary | ICD-10-CM

## 2015-06-23 DIAGNOSIS — D696 Thrombocytopenia, unspecified: Secondary | ICD-10-CM

## 2015-06-23 LAB — MAGNESIUM: Magnesium: 2 mg/dL (ref 1.7–2.4)

## 2015-06-23 MED ORDER — PANTOPRAZOLE SODIUM 40 MG PO TBEC: 40.0000 mg | DELAYED_RELEASE_TABLET | Freq: Two times a day (BID) | ORAL | Status: DC

## 2015-06-23 MED ORDER — METOPROLOL TARTRATE 25 MG PO TABS: 25.0000 mg | ORAL_TABLET | Freq: Two times a day (BID) | ORAL | Status: DC

## 2015-06-23 MED ORDER — LISINOPRIL 20 MG PO TABS: 20.0000 mg | ORAL_TABLET | Freq: Every day | ORAL | Status: DC

## 2015-06-23 MED ORDER — ALBUTEROL SULFATE (2.5 MG/3ML) 0.083% IN NEBU: 3.0000 mL | INHALATION_SOLUTION | Freq: Four times a day (QID) | RESPIRATORY_TRACT | Status: DC | PRN

## 2015-06-23 NOTE — Discharge Instructions (Signed)
Gastrointestinal Bleeding °Gastrointestinal bleeding is bleeding somewhere along the path that food travels through the body (digestive tract). This path is anywhere between the mouth and the opening of the butt (anus). You may have blood in your throw up (vomit) or in your poop (stools). If there is a lot of bleeding, you may need to stay in the hospital. °HOME CARE °· Only take medicine as told by your doctor. °· Eat foods with fiber such as whole grains, fruits, and vegetables. You can also try eating 1 to 3 prunes a day. °· Drink enough fluids to keep your pee (urine) clear or pale yellow. °GET HELP RIGHT AWAY IF:  °· Your bleeding gets worse. °· You feel dizzy, weak, or you pass out (faint). °· You have bad cramps in your back or belly (abdomen). °· You have large blood clumps (clots) in your poop. °· Your problems are getting worse. °MAKE SURE YOU:  °· Understand these instructions. °· Will watch your condition. °· Will get help right away if you are not doing well or get worse. °  °This information is not intended to replace advice given to you by your health care provider. Make sure you discuss any questions you have with your health care provider. °  °Document Released: 01/09/2008 Document Revised: 03/18/2012 Document Reviewed: 09/19/2014 °Elsevier Interactive Patient Education ©2016 [employer] ° °

## 2015-06-23 NOTE — Discharge Summary (Signed)
[facility] [place] [facility]   PATIENT NAME: Caleb Carpenter    MR#:  [id number]  DATE OF BIRTH:  Dec 06, 1955  DATE OF ADMISSION:  06/21/2015 ADMITTING PHYSICIAN: Katha Hamming, MD  DATE OF DISCHARGE: No discharge date for patient encounter.  PRIMARY CARE PHYSICIAN: No PCP Per Patient     ADMISSION DIAGNOSIS:  Acute upper GI bleed [K92.2]  DISCHARGE DIAGNOSIS:  Principal Problem:   Upper GI bleed Active Problems:   Gastritis   Duodenal ulceration   Reflux esophagitis   Acute posthemorrhagic anemia   Alcohol abuse   Thrombocytopenia (HCC)   Hypomagnesemia   Leukocytosis   SECONDARY DIAGNOSIS:   Past Medical History  Diagnosis Date  . Hypertension   . Asthma     .pro HOSPITAL COURSE:   Patient is 60 year old Caucasian male with past medical history significant history of alcohol abuse who presents to the hospital with complaints of melanotic stool, coffee-ground emesis. In ER the patient was noted to be tachycardic. His hemoglobin level was found to be 12.3, decreased to 10.4 with hydration. Bleeding stopped.  Hemoglobin remained stable, did not require transfusion. Patient had a an episode of a 5 beat tachycardia Magnesium level was found to be low, supplemented. Patient had the EGD March 8th, 2017, revealing gastritis and duodenal ulcers, no bleeding. Patient was initiated on diet which was advanced and he did well with no significant discomfort or recurrent bleeding. He was felt to be stable to be discharged home. Discussion by problem #1. Upper GI bleed, due to gastritis and duodenal ulcers,  resolved with conservative therapy, hemoglobin level remained stable and was at the lowest at 10.4 by the day of discharge with hydration, patient was advised to continue PPI , follow up with gastroenterology as outpatient in about 2 weeks #2. Gastritis, duodenal ulcers, questionable alcohol-related, no nonsteroidal anti-inflammatory medications  reported per patient, continue PPIs, H. pylori testing in stool was performed, results are pending #3 posthemorrhagic anemia, acute, stable hemoglobin level , no transfusion was indicated  #4. Hypomagnesemia, supplemented intravenously #5. Leukocytosis, resolved with therapy. #6, thrombocytopenia, likely consumption, versus related to alcohol abuse. Follow as outpatient #7, alcohol abuse, no signs or symptoms of alcohol withdrawal was noted while in the hospital DISCHARGE CONDITIONS:   Stable  CONSULTS OBTAINED:  Treatment Team:  Midge Minium, MD  DRUG ALLERGIES:   Allergies  Allergen Reactions  . Codeine     DISCHARGE MEDICATIONS:   Current Discharge Medication List    START taking these medications   Details  pantoprazole (PROTONIX) 40 MG tablet Take 1 tablet (40 mg total) by mouth 2 (two) times daily. Qty: 60 tablet, Refills: 5      CONTINUE these medications which have CHANGED   Details  lisinopril (PRINIVIL,ZESTRIL) 20 MG tablet Take 1 tablet (20 mg total) by mouth daily. Qty: 30 tablet, Refills: 5    metoprolol tartrate (LOPRESSOR) 25 MG tablet Take 1 tablet (25 mg total) by mouth 2 (two) times daily. Qty: 60 tablet, Refills: 6      CONTINUE these medications which have NOT CHANGED   Details  albuterol (PROVENTIL HFA;VENTOLIN HFA) 108 (90 Base) MCG/ACT inhaler Inhale 2 puffs into the lungs 2 (two) times daily.    folic acid (FOLVITE) 1 MG tablet Take 1 mg by mouth 2 (two) times daily.         DISCHARGE INSTRUCTIONS:    Patient is to follow-up with primary care physician, gastroenterologist as outpatient  If you experience  worsening of your admission symptoms, develop shortness of breath, life threatening emergency, suicidal or homicidal thoughts you must seek medical attention immediately by calling 911 or calling your MD immediately  if symptoms less severe.  You Must read complete instructions/literature along with all the possible adverse  reactions/side effects for all the Medicines you take and that have been prescribed to you. Take any new Medicines after you have completely understood and accept all the possible adverse reactions/side effects.   Please note  You were cared for by a hospitalist during your hospital stay. If you have any questions about your discharge medications or the care you received while you were in the hospital after you are discharged, you can call the unit and asked to speak with the hospitalist on call if the hospitalist that took care of you is not available. Once you are discharged, your primary care physician will handle any further medical issues. Please note that NO REFILLS for any discharge medications will be authorized once you are discharged, as it is imperative that you return to your primary care physician (or establish a relationship with a primary care physician if you do not have one) for your aftercare needs so that they can reassess your need for medications and monitor your lab values.    Today   CHIEF COMPLAINT:   Chief Complaint  Patient presents with  . Emesis    HISTORY OF PRESENT ILLNESS:  Caleb Carpenter  is a 60 y.o. male with a known history of alcohol abuse who presents to the hospital with complaints of melanotic stool, coffee-ground emesis. In ER the patient was noted to be tachycardic. His hemoglobin level was found to be 12.3, decreased to 10.4 with hydration. Bleeding stopped.  Hemoglobin remained stable, did not require transfusion. Patient had a an episode of a 5 beat tachycardia Magnesium level was found to be low, supplemented. Patient had the EGD March 8th, 2017, revealing gastritis and duodenal ulcers, no bleeding. Patient was initiated on diet which was advanced and he did well with no significant discomfort or recurrent bleeding. He was felt to be stable to be discharged home. Discussion by problem #1. Upper GI bleed, due to gastritis and duodenal ulcers,  resolved with  conservative therapy, hemoglobin level remained stable and was at the lowest at 10.4 by the day of discharge with hydration, patient was advised to continue PPI , follow up with gastroenterology as outpatient in about 2 weeks #2. Gastritis, duodenal ulcers, questionable alcohol-related, no nonsteroidal anti-inflammatory medications reported per patient, continue PPIs, H. pylori testing in stool was performed, results are pending #3 posthemorrhagic anemia, acute, stable hemoglobin level , no transfusion was indicated  #4. Hypomagnesemia, supplemented intravenously #5. Leukocytosis, resolved with therapy. #6, thrombocytopenia, likely consumption, versus related to alcohol abuse. Follow as outpatient #7, alcohol abuse, no signs or symptoms of alcohol withdrawal was noted while in the hospital    VITAL SIGNS:  Blood pressure 122/68, pulse 88, temperature 98.6 F (37 C), temperature source Oral, resp. rate 17, height  (1.651 m), weight 43.1 kg (95 lb 0.3 oz), SpO2 100 %.  I/O:   Intake/Output Summary (Last 24 hours) at 06/23/15 1514 Last data filed at 06/23/15 1207  Gross per 24 hour  Intake   3493 ml  Output   2125 ml  Net   1368 ml    PHYSICAL EXAMINATION:  GENERAL:  60 y.o.-year-old patient lying in the bed with no acute distress.  EYES: Pupils equal, round, reactive to  light and accommodation. No scleral icterus. Extraocular muscles intact.  HEENT: Head atraumatic, normocephalic. Oropharynx and nasopharynx clear.  NECK:  Supple, no jugular venous distention. No thyroid enlargement, no tenderness.  LUNGS: Normal breath sounds bilaterally, no wheezing, rales,rhonchi or crepitation. No use of accessory muscles of respiration.  CARDIOVASCULAR: S1, S2 normal. No murmurs, rubs, or gallops.  ABDOMEN: Soft, non-tender, non-distended. Bowel sounds present. No organomegaly or mass.  EXTREMITIES: No pedal edema, cyanosis, or clubbing.  NEUROLOGIC: Cranial nerves II through XII are intact.  Muscle strength 5/5 in all extremities. Sensation intact. Gait not checked.  PSYCHIATRIC: The patient is alert and oriented x 3.  SKIN: No obvious rash, lesion, or ulcer.   DATA REVIEW:   CBC  Recent Labs Lab 06/22/15 0414 06/22/15 1416  WBC 6.7  --   HGB 10.8* 10.4*  HCT 32.2*  --   PLT 118*  --     Chemistries   Recent Labs Lab 06/21/15 0724  06/23/15 0529  NA 135  --   --   K 4.9  --   --   CL 95*  --   --   CO2 24  --   --   GLUCOSE 128*  --   --   BUN 23*  --   --   CREATININE 0.65  --   --   CALCIUM 8.4*  --   --   MG  --   < > 2.0  AST 50*  --   --   ALT 26  --   --   ALKPHOS 74  --   --   BILITOT 1.0  --   --   < > = values in this interval not displayed.  Cardiac Enzymes No results for input(s): TROPONINI in the last 168 hours.  Microbiology Results  Results for orders placed or performed during the hospital encounter of 06/21/15  MRSA PCR Screening     Status: None   Collection Time: 06/21/15 11:28 AM  Result Value Ref Range Status   MRSA by PCR NEGATIVE NEGATIVE Final    Comment:        The GeneXpert MRSA Assay (FDA approved for NASAL specimens only), is one component of a comprehensive MRSA colonization surveillance program. It is not intended to diagnose MRSA infection nor to guide or monitor treatment for MRSA infections.     RADIOLOGY:  No results found.  EKG:   Orders placed or performed during the hospital encounter of 06/21/15  . ED EKG  . ED EKG  . EKG 12-Lead  . EKG 12-Lead      Management plans discussed with the patient, family and they are in agreement.  CODE STATUS:     Code Status Orders        Start     Ordered   06/21/15 0900  Full code   Continuous     06/21/15 0901    Code Status History    Date Active Date Inactive Code Status Order ID Comments User Context   This patient has a current code status but no historical code status.      TOTAL TIME TAKING CARE OF THIS PATIENT: 40 minutes.     Katharina Caper M.D on 06/23/2015 at 3:14 PM  Between 7am to 6pm - Pager - [phone]  After 6pm go to [email or web address] - password [email or web address]  [place] [facility]  Office  [phone]  CC: Primary care physician; No PCP Per Patient

## 2015-06-23 NOTE — Evaluation (Signed)
Physical Therapy Evaluation Patient Details Name: MADOC HOLQUIN MRN: [id number] DOB: 18-Mar-1956 Today's Date: 06/23/2015   History of Present Illness  Pt here wtih upper GI bleed, has ulcers - endoscopy reveals no fresh blood.  Clinical Impression  60 y/o male who previously lived in [place], unsure where he will be going upon discharge.  He did well with PT showing no safety issues, losses of balance or other concerns related to PT.  He has very little fatigue, has functional strength and ultimately is at or near his baseline.      Follow Up Recommendations No PT follow up    Equipment Recommendations       Recommendations for Other Services       Precautions / Restrictions Precautions Precautions: Fall (mod fall) Restrictions Weight Bearing Restrictions: No      Mobility  Bed Mobility Overal bed mobility: Independent                Transfers Overall transfer level: Independent               General transfer comment: Pt able to rise and maintain balance w/o issue  Ambulation/Gait Ambulation/Gait assistance: Independent Ambulation Distance (Feet): 200 Feet Assistive device: None       General Gait Details: Pt walks with consistent and appropriate cadence. He has no LOBs or safety concerns and overall reports to be near his baseline.   Stairs            Wheelchair Mobility    Modified Rankin (Stroke Patients Only)       Balance Overall balance assessment: No apparent balance deficits (not formally assessed)                                           Pertinent Vitals/Pain Pain Assessment: No/denies pain    Home Living Family/patient expects to be discharged to::  (pt unsure where he will go)                      Prior Function Level of Independence: Independent               Hand Dominance        Extremity/Trunk Assessment   Upper Extremity Assessment: Overall WFL for tasks  assessed;Generalized weakness (pt lacks b/l shoulder elevation >100)           Lower Extremity Assessment: Overall WFL for tasks assessed;Generalized weakness (pt lacks full knee extension on the L)         Communication   Communication: No difficulties  Cognition Arousal/Alertness: Awake/alert Behavior During Therapy: WFL for tasks assessed/performed Overall Cognitive Status: Within Functional Limits for tasks assessed                      General Comments      Exercises        Assessment/Plan    PT Assessment Patent does not need any further PT services  PT Diagnosis Difficulty walking;Generalized weakness   PT Problem List    PT Treatment Interventions     PT Goals (Current goals can be found in the Care Plan section) Acute Rehab PT Goals Patient Stated Goal: "I need to figure out where I'm going to go."    Frequency     Barriers to discharge        Co-evaluation  End of Session Equipment Utilized During Treatment: Gait belt Activity Tolerance: Patient tolerated treatment well Patient left: in chair           Time: 8016-5537 PT Time Calculation (min) (ACUTE ONLY): 15 min   Charges:   PT Evaluation $PT Eval Low Complexity: 1 Procedure     PT G Codes:       Loran Senters, PT, DPT [phone]  Malachi Pro 06/23/2015, 11:33 AM

## 2015-06-23 NOTE — Progress Notes (Signed)
MD ordered patient to be discharged home.  Discharge instructions were reviewed with the patient and he voiced understanding.  The care manager was able to help the patient with his medications.  Follow-up appointment was made.  IV was removed with catheter intact.  Patient was given a taxi voucher to go to the homeless shelter.  Patient left via wheelchair escorted by nursing.

## 2015-06-23 NOTE — Care Management (Signed)
Patient to be provided with 1 month of protonix, metoprolol, and lisinopril.  Medication will be picked up from Medication Management and delivered to patient prior to discharge.  Patient states that he does not shoes to wear at the time of discharge.  Shoes were provided to the patient from the [employer].

## 2015-06-23 NOTE — Progress Notes (Signed)
Patient is being discharged.  He ask that the bed alarm be turned off since he is discharged.  I educated him on the need for it but he refused.

## 2015-06-23 NOTE — Progress Notes (Addendum)
  [facility]  [address], [address] [place], [place] [address] Phone: [phone] Fax : [phone]   Subjective: Upper G.I. Bleed with a upper endoscopy showing non bleeding duodenal ulcers. There is no old or fresh blood in the stomach or small intestines that was examined during the EGD. The patient has had continued  Drop in his hemoglobin but reports that he is having less black stools.   Objective: Vital signs in last 24 hours: Filed Vitals:   06/22/15 0745 06/22/15 1157 06/22/15 1942 06/22/15 2036  BP:  129/72  132/77  Pulse:  76  97  Temp:  98.9 F (37.2 C)  98.4 F (36.9 C)  TempSrc:  Oral  Oral  Resp:  18  16  Height:      Weight:      SpO2: 97% 98% 97% 100%   Weight change:   Intake/Output Summary (Last 24 hours) at 06/23/15 0645 Last data filed at 06/23/15 0542  Gross per 24 hour  Intake   2230 ml  Output   1225 ml  Net   1005 ml     Exam: Heart:: Regular rate and rhythm Lungs: normal Abdomen: soft, nontender, normal bowel sounds   Lab Results: @LABTEST2 @ Micro Results: Recent Results (from the past 240 hour(s))  MRSA PCR Screening     Status: None   Collection Time: 06/21/15 11:28 AM  Result Value Ref Range Status   MRSA by PCR NEGATIVE NEGATIVE Final    Comment:        The GeneXpert MRSA Assay (FDA approved for NASAL specimens only), is one component of a comprehensive MRSA colonization surveillance program. It is not intended to diagnose MRSA infection nor to guide or monitor treatment for MRSA infections.    Studies/Results: Dg Chest Portable 1 View  06/21/2015  CLINICAL DATA:  Vomiting of blood since this morning.  Chest pain. EXAM: PORTABLE CHEST 1 VIEW COMPARISON:  06/26/2005 FINDINGS: There is hyperinflation of the lungs compatible with COPD. Lucency over the left lung base, likely moderate hiatal hernia. Heart is normal size. No confluent airspace opacities or effusions. No acute bony abnormality. IMPRESSION: COPD.   No active disease. Electronically Signed   By: Charlett Nose M.D.   On: 06/21/2015 08:23   Medications: I have reviewed the patient's current medications. Scheduled Meds: . albuterol  3 mL Inhalation BID  . folic acid  1 mg Oral BID  . magnesium sulfate 1 - 4 g bolus IVPB  4 g Intravenous BID  . pantoprazole (PROTONIX) IV  40 mg Intravenous Q12H   Continuous Infusions: . sodium chloride 100 mL/hr at 06/23/15 0542   PRN Meds:.   Assessment: Active Problems:   Upper GI bleed   Reflux esophagitis   Gastritis   Duodenal ulceration    Plan: Status post upper [name] bleed with an EGD showing healing  Duodenal ulcers. The patient's drop in hemoglobin is likely due to equilibration and hydration. Nothing further to do from a G.I. point of view at this time.   LOS: 2 days   Caleb Carpenter 06/23/2015, 6:45 AM

## 2015-07-06 ENCOUNTER — Encounter: Payer: Self-pay | Admitting: Gastroenterology

## 2015-07-06 ENCOUNTER — Ambulatory Visit (INDEPENDENT_AMBULATORY_CARE_PROVIDER_SITE_OTHER): Payer: Medicaid - Out of State | Admitting: Gastroenterology

## 2015-07-06 VITALS — BP 134/74 | HR 80 | Temp 99.3°F | Ht 65.0 in | Wt 107.0 lb

## 2015-07-06 DIAGNOSIS — K922 Gastrointestinal hemorrhage, unspecified: Secondary | ICD-10-CM | POA: Diagnosis not present

## 2015-07-06 NOTE — Progress Notes (Signed)
Primary Care Physician: No PCP Per Patient  Primary Gastroenterologist:  Dr. Midge Minium  Chief Complaint  Patient presents with  . Hospitalization Follow-up  . GI Bleeding    HPI: Caleb Carpenter is a 60 y.o. male here for follow-up after being discharged from the hospital with an upper GI bleed. The patient has had no further black stools or bloody stools. He also denies any vomiting up of any blood. He is not feeling weak or tired. He was found to have esophagitis on his upper endoscopy and duodenal ulcers. He states he has been doing well since his discharge.  Current Outpatient Prescriptions  Medication Sig Dispense Refill  . albuterol (PROVENTIL HFA;VENTOLIN HFA) 108 (90 Base) MCG/ACT inhaler Inhale 2 puffs into the lungs 2 (two) times daily.    . folic acid (FOLVITE) 1 MG tablet Take 1 mg by mouth 2 (two) times daily.    Marland Kitchen lisinopril (PRINIVIL,ZESTRIL) 20 MG tablet Take 1 tablet (20 mg total) by mouth daily. 30 tablet 5  . metoprolol tartrate (LOPRESSOR) 25 MG tablet Take 1 tablet (25 mg total) by mouth 2 (two) times daily. 60 tablet 6  . pantoprazole (PROTONIX) 40 MG tablet Take 1 tablet (40 mg total) by mouth 2 (two) times daily. 60 tablet 5   No current facility-administered medications for this visit.    Allergies as of 07/06/2015 - Review Complete 07/06/2015  Allergen Reaction Noted  . Codeine  06/21/2015    ROS:  General: Negative for anorexia, weight loss, fever, chills, fatigue, weakness. ENT: Negative for hoarseness, difficulty swallowing , nasal congestion. CV: Negative for chest pain, angina, palpitations, dyspnea on exertion, peripheral edema.  Respiratory: Negative for dyspnea at rest, dyspnea on exertion, cough, sputum, wheezing.  GI: See history of present illness. GU:  Negative for dysuria, hematuria, urinary incontinence, urinary frequency, nocturnal urination.  Endo: Negative for unusual weight change.    Physical Examination:   BP 134/74 mmHg  Pulse  80  Temp(Src) 99.3 F (37.4 C) (Oral)  Ht 5\' 5"  (1.651 m)  Wt 107 lb (48.535 kg)  BMI 17.81 kg/m2  General: Well-nourished, well-developed in no acute distress.  Eyes: No icterus. Conjunctivae pink. Mouth: Oropharyngeal mucosa moist and pink , no lesions erythema or exudate. Lungs: Clear to auscultation bilaterally. Non-labored. Heart: Regular rate and rhythm, no murmurs rubs or gallops.  Abdomen: Bowel sounds are normal, nontender, nondistended, no hepatosplenomegaly or masses, no abdominal bruits or hernia , no rebound or guarding.   Extremities: No lower extremity edema. No clubbing or deformities. Neuro: Alert and oriented x 3.  Grossly intact. Skin: Warm and dry, no jaundice.   Psych: Alert and cooperative, normal mood and affect.  Labs:    Imaging Studies: Dg Chest Portable 1 View  06/21/2015  CLINICAL DATA:  Vomiting of blood since this morning.  Chest pain. EXAM: PORTABLE CHEST 1 VIEW COMPARISON:  06/26/2005 FINDINGS: There is hyperinflation of the lungs compatible with COPD. Lucency over the left lung base, likely moderate hiatal hernia. Heart is normal size. No confluent airspace opacities or effusions. No acute bony abnormality. IMPRESSION: COPD.  No active disease. Electronically Signed   By: Charlett Nose M.D.   On: 06/21/2015 08:23    Assessment and Plan:   Caleb Carpenter is a 60 y.o. y/o male who comes for follow-up after being discharged from the hospital for an upper GI bleed. The patient has been doing well and will have his blood checked for a CBC today. The  patient has been encouraged to find a primary care provider since he has moved here from [place]. The patient will follow-up with me as needed.   Note: This dictation was prepared with Dragon dictation along with smaller phrase technology. Any transcriptional errors that result from this process are unintentional.

## 2015-07-13 ENCOUNTER — Encounter: Payer: Self-pay | Admitting: Gastroenterology

## 2015-07-19 ENCOUNTER — Telehealth: Payer: Self-pay

## 2015-07-19 NOTE — Telephone Encounter (Signed)
-----   Message from Midge Minium, MD sent at 07/18/2015  8:01 AM EDT ----- The patient know that his hemoglobin has come back up to his near normal now.

## 2015-07-19 NOTE — Telephone Encounter (Signed)
Pt's brother has been notified of pt's lab results.

## 2015-08-03 ENCOUNTER — Emergency Department
Admission: EM | Admit: 2015-08-03 | Discharge: 2015-08-04 | Disposition: A | Discharge status: Home / Self Care | Payer: Medicaid - Out of State | Attending: Emergency Medicine | Admitting: Emergency Medicine

## 2015-08-03 ENCOUNTER — Emergency Department: Payer: Medicaid - Out of State

## 2015-08-03 DIAGNOSIS — Y929 Unspecified place or not applicable: Secondary | ICD-10-CM | POA: Diagnosis not present

## 2015-08-03 DIAGNOSIS — W109XXA Fall (on) (from) unspecified stairs and steps, initial encounter: Secondary | ICD-10-CM | POA: Diagnosis not present

## 2015-08-03 DIAGNOSIS — Z87891 Personal history of nicotine dependence: Secondary | ICD-10-CM | POA: Diagnosis not present

## 2015-08-03 DIAGNOSIS — Y908 Blood alcohol level of 240 mg/100 ml or more: Secondary | ICD-10-CM | POA: Diagnosis not present

## 2015-08-03 DIAGNOSIS — S42291A Other displaced fracture of upper end of right humerus, initial encounter for closed fracture: Secondary | ICD-10-CM | POA: Insufficient documentation

## 2015-08-03 DIAGNOSIS — S42201A Unspecified fracture of upper end of right humerus, initial encounter for closed fracture: Secondary | ICD-10-CM

## 2015-08-03 DIAGNOSIS — J45909 Unspecified asthma, uncomplicated: Secondary | ICD-10-CM | POA: Diagnosis not present

## 2015-08-03 DIAGNOSIS — Z79899 Other long term (current) drug therapy: Secondary | ICD-10-CM | POA: Insufficient documentation

## 2015-08-03 DIAGNOSIS — Y999 Unspecified external cause status: Secondary | ICD-10-CM | POA: Diagnosis not present

## 2015-08-03 DIAGNOSIS — F10920 Alcohol use, unspecified with intoxication, uncomplicated: Secondary | ICD-10-CM

## 2015-08-03 DIAGNOSIS — F10129 Alcohol abuse with intoxication, unspecified: Secondary | ICD-10-CM | POA: Insufficient documentation

## 2015-08-03 DIAGNOSIS — M25511 Pain in right shoulder: Secondary | ICD-10-CM | POA: Diagnosis present

## 2015-08-03 DIAGNOSIS — I10 Essential (primary) hypertension: Secondary | ICD-10-CM | POA: Diagnosis not present

## 2015-08-03 DIAGNOSIS — Y939 Activity, unspecified: Secondary | ICD-10-CM | POA: Insufficient documentation

## 2015-08-03 LAB — CBC WITH DIFFERENTIAL/PLATELET
Basophils Absolute: 0.1 10*3/uL (ref 0–0.1)
Basophils Relative: 1 %
EOS ABS: 0.1 10*3/uL (ref 0–0.7)
Eosinophils Relative: 1 %
HCT: 38.3 % — ABNORMAL LOW (ref 40.0–52.0)
HEMOGLOBIN: 12.7 g/dL — AB (ref 13.0–18.0)
LYMPHS ABS: 3.1 10*3/uL (ref 1.0–3.6)
LYMPHS PCT: 27 %
MCH: 30.8 pg (ref 26.0–34.0)
MCHC: 33.1 g/dL (ref 32.0–36.0)
MCV: 93.1 fL (ref 80.0–100.0)
MONOS PCT: 4 %
Monocytes Absolute: 0.5 10*3/uL (ref 0.2–1.0)
NEUTROS PCT: 67 %
Neutro Abs: 7.7 10*3/uL — ABNORMAL HIGH (ref 1.4–6.5)
Platelets: 403 10*3/uL (ref 150–440)
RBC: 4.11 MIL/uL — ABNORMAL LOW (ref 4.40–5.90)
RDW: 16.2 % — ABNORMAL HIGH (ref 11.5–14.5)
WBC: 11.5 10*3/uL — ABNORMAL HIGH (ref 3.8–10.6)

## 2015-08-03 LAB — COMPREHENSIVE METABOLIC PANEL
ALK PHOS: 99 U/L (ref 38–126)
ALT: 16 U/L — AB (ref 17–63)
AST: 28 U/L (ref 15–41)
Albumin: 3.8 g/dL (ref 3.5–5.0)
Anion gap: 12 (ref 5–15)
BILIRUBIN TOTAL: 0.5 mg/dL (ref 0.3–1.2)
CALCIUM: 8.4 mg/dL — AB (ref 8.9–10.3)
CHLORIDE: 101 mmol/L (ref 101–111)
CO2: 24 mmol/L (ref 22–32)
CREATININE: 0.62 mg/dL (ref 0.61–1.24)
Glucose, Bld: 92 mg/dL (ref 65–99)
Potassium: 3.9 mmol/L (ref 3.5–5.1)
Sodium: 137 mmol/L (ref 135–145)
TOTAL PROTEIN: 7.2 g/dL (ref 6.5–8.1)

## 2015-08-03 LAB — ETHANOL: ALCOHOL ETHYL (B): 358 mg/dL — AB (ref <5)

## 2015-08-03 LAB — TROPONIN I

## 2015-08-03 MED ORDER — SODIUM CHLORIDE 0.9 % IV BOLUS (SEPSIS)
1000.0000 mL | Freq: Once | INTRAVENOUS | Status: AC
  Administered 2015-08-03: 1000 mL via INTRAVENOUS

## 2015-08-03 MED ORDER — IPRATROPIUM-ALBUTEROL 0.5-2.5 (3) MG/3ML IN SOLN
3.0000 mL | Freq: Four times a day (QID) | RESPIRATORY_TRACT | Status: DC
  Administered 2015-08-03 – 2015-08-04 (×4): 3 mL via RESPIRATORY_TRACT
  Filled 2015-08-03 (×4): qty 3

## 2015-08-03 NOTE — ED Provider Notes (Signed)
CSN: [id number]     Arrival date & time 08/03/15  2102 History   First MD Initiated Contact with Patient 08/03/15 2103     Chief Complaint  Patient presents with  . Fall     (Consider location/radiation/quality/duration/timing/severity/associated sxs/prior Treatment) The history is provided by the patient.  CRISTIAN DAVITT is a 60 y.o. male history hypertension, asthma here presenting with fall. Patient was drinking some alcohol and was on his porch and fell and hit the right shoulder. Denies any head injury or other injury. He was noted to be hypoxic 89% on room air as per EMS. He has hx of COPD and is a former smoker.    Past Medical History  Diagnosis Date  . Hypertension   . Asthma    Past Surgical History  Procedure Laterality Date  . Esophagogastroduodenoscopy (egd) with propofol N/A 06/21/2015    Procedure: ESOPHAGOGASTRODUODENOSCOPY (EGD) WITH PROPOFOL;  Surgeon: Midge Minium, MD;  Location: [facility] ENDOSCOPY;  Service: Endoscopy;  Laterality: N/A;   No family history on file. Social History  Substance Use Topics  . Smoking status: Former Smoker    Types: Cigarettes    Quit date: 06/21/2014  . Smokeless tobacco: Current User    Types: Chew  . Alcohol Use: 1.2 oz/week    2 Cans of beer per week     Comment: per day    Review of Systems  Musculoskeletal:       R arm pain   All other systems reviewed and are negative.     Allergies  Codeine  Home Medications   Prior to Admission medications   Medication Sig Start Date End Date Taking? Authorizing Provider  albuterol (PROVENTIL HFA;VENTOLIN HFA) 108 (90 Base) MCG/ACT inhaler Inhale 2 puffs into the lungs 2 (two) times daily.    Historical Provider, MD  folic acid (FOLVITE) 1 MG tablet Take 1 mg by mouth 2 (two) times daily.    Historical Provider, MD  lisinopril (PRINIVIL,ZESTRIL) 20 MG tablet Take 1 tablet (20 mg total) by mouth daily. 06/23/15   Katharina Caper, MD  metoprolol tartrate (LOPRESSOR) 25 MG tablet Take 1  tablet (25 mg total) by mouth 2 (two) times daily. 06/23/15   Katharina Caper, MD  pantoprazole (PROTONIX) 40 MG tablet Take 1 tablet (40 mg total) by mouth 2 (two) times daily. 06/23/15   Katharina Caper, MD   BP 102/66 mmHg  Pulse 95  Temp(Src) 98 F (36.7 C)  Resp 12  Ht  (1.651 m)  Wt 113 lb 15.7 oz (51.7 kg)  BMI 18.97 kg/m2  SpO2 100% Physical Exam  Constitutional: He is oriented to person, place, and time.  Intoxicated   HENT:  Head: Normocephalic.  Mouth/Throat: Oropharynx is clear and moist.  Eyes: Conjunctivae are normal. Pupils are equal, round, and reactive to light.  Neck: Normal range of motion. Neck supple.  Cardiovascular: Normal rate, regular rhythm and normal heart sounds.   Pulmonary/Chest: Effort normal.  Minimal wheezing throughout   Abdominal: Soft. Bowel sounds are normal. He exhibits no distension. There is no tenderness. There is no rebound.  Musculoskeletal:  Tenderness R proximal humerus, R shoulder doesn't appear dislocated. Pelvis stable, nl ROM bilateral hips   Neurological: He is alert and oriented to person, place, and time.  Skin: Skin is dry.  Psychiatric: He has a normal mood and affect. His behavior is normal. Thought content normal.  Nursing note and vitals reviewed.   ED Course  Procedures (including critical care time)  Labs Review Labs Reviewed  CBC WITH DIFFERENTIAL/PLATELET - Abnormal; Notable for the following:    WBC 11.5 (*)    RBC 4.11 (*)    Hemoglobin 12.7 (*)    HCT 38.3 (*)    RDW 16.2 (*)    Neutro Abs 7.7 (*)    All other components within normal limits  ETHANOL - Abnormal; Notable for the following:    Alcohol, Ethyl (B) 358 (*)    All other components within normal limits  COMPREHENSIVE METABOLIC PANEL  TROPONIN I    Imaging Review Dg Chest 1 View  08/03/2015  CLINICAL DATA:  Fall.  Asthma.  Hypertension. EXAM: CHEST 1 VIEW COMPARISON:  06/21/2015 FINDINGS: Prominent emphysema. Numerous old bilateral rib  fractures. Atherosclerotic aortic arch. No pneumothorax or pleural effusion. Heart size within normal limits. IMPRESSION: 1. Prominent emphysema. 2. Numerous bilateral old rib fractures. Electronically Signed   By: Gaylyn Rong M.D.   On: 08/03/2015 21:39   Dg Humerus Right  08/03/2015  CLINICAL DATA:  Fall down stairs.  Right shoulder pain. EXAM: RIGHT HUMERUS - 2+ VIEW COMPARISON:  None. FINDINGS: Comminuted and displaced fracture the right proximal humeral metadiaphysis. Vascular calcification. Bony demineralization. Old right upper rib fractures. The comminuted fractures may extend as high as the surgical neck. IMPRESSION: 1. Comminuted and displaced proximal humeral meta diaphyseal fractures, potentially involving the surgical neck. 2. Bony demineralization. Electronically Signed   By: Gaylyn Rong M.D.   On: 08/03/2015 21:37   I have personally reviewed and evaluated these images and lab results as part of my medical decision-making.   EKG Interpretation None      MDM   Final diagnoses:  None    LONNY BARTCH is a 60 y.o. male here with alcohol intoxication, fall. Likely has proximal humerus fracture. Has good radial pulses and neurovascular intact RUE. Will get xrays, labs, ETOH level.   11:07 PM Xray showed R proximal humerus fracture. Neurovascular intact. Consulted Dr. Martha Clan, who recommend sling and outpatient follow up. ETOH 358. Family at bedside. They state that patient doesn't live with them and will likely go home and drink more alcohol. He is willing to consider detox. Chemistry pending. Signed out to Dr. Cyril Loosen in the ED. Likely discharge or go to detox when sober.     Richardean Canal, MD 08/03/15 [phone]

## 2015-08-03 NOTE — ED Notes (Signed)
Pt from home via EMS, reports he fell down a few steps and now having shoulder pain, pt also intoxicated, reports he drank "4 or so beers" family reports he drinks about 12 a day. Pt was 89% on RA upon EMS arrival. Pt on 2L now

## 2015-08-04 MED ORDER — LORAZEPAM 2 MG PO TABS
ORAL_TABLET | ORAL | Status: AC
  Administered 2015-08-04: 2 mg via ORAL
  Filled 2015-08-04: qty 1

## 2015-08-04 MED ORDER — LORAZEPAM 2 MG PO TABS
2.0000 mg | ORAL_TABLET | Freq: Once | ORAL | Status: AC
  Administered 2015-08-04: 2 mg via ORAL

## 2015-08-04 MED ORDER — SODIUM CHLORIDE 0.9 % IV BOLUS (SEPSIS)
1000.0000 mL | Freq: Once | INTRAVENOUS | Status: AC
Start: 2015-08-04 — End: 2015-08-04
  Administered 2015-08-04: 1000 mL via INTRAVENOUS

## 2015-08-04 NOTE — BH Assessment (Signed)
Writer talked with patient about his housing options upon discharged. Patient stated he didn't know where he would go and he wasn't concerned about it. Writer discussed with him his alcohol use and options for inpatient detox and SA Treatment. Patient states he didn't have a problem with his alcohol use and wasn't interested in treatment.  Patient denies SI/HI and AV/H/  Writer informed ER MD (Dr. Shaune Pollack).

## 2015-08-04 NOTE — ED Notes (Signed)
Declined meal

## 2015-08-04 NOTE — ED Notes (Signed)
Sister-in-law, Myriam Jacobson called with concerned with patient's discharge disposition.  She states he cannot take care of himself and lost his billfold and is not eating/drinking.  She also stated patient cannot read. She would like social work to call her in regards to his disposition.  Printice Resendis can be reached at [phone].

## 2015-08-04 NOTE — ED Provider Notes (Signed)
Patient with some tremors and elevated heart rate, concerning for alcohol withdrawal. Patient was given 2 L by mouth Ativan.  Nurse notes that the patient's family member, I think this is a sister-in-law, had called requesting to speak with social work about disposition.  From what I understand, patient was not interested in alcohol detox and was waiting until sober this morning for discharge home. Social worker will help with disposition.  Governor Rooks, MD 08/04/15 [phone]

## 2015-08-04 NOTE — ED Notes (Signed)
Patient was discharged to the lobby to wait for cab. Patient will be discharged to homeless shelter as he is not interested in any treatment facilities or getting extra help.  Patient is able to ambulate, slowly. Sent with him a tray of food.  Myriam Jacobson is aware patient is being discharged to homeless shelter.  Patient does have his billfold and cell phone.

## 2015-08-04 NOTE — BHH Counselor (Signed)
Pt presenting to the ED with concerns of injuries received after a fall from his porch. Patient was drinking some alcohol and was on his porch and fell and hit the right shoulder.  Pt reportedly drinks on daily basis.  Pt's family not available to answer questions.  Pt has requested assisted with detox from alcohol.  Pt has out of state Medicaid which prevents him from receiving services with [facility].  Pt was provided with the following resources for follow up:  [facility] [place]) [facility]) [facility] [place]) [facility])

## 2015-08-04 NOTE — ED Notes (Signed)
Spoke with sister-in-law Myriam Jacobson re: patient's disposition.  Explained to her after reading the note from TTS and speaking with SW that since patient has out of state medicaid placement is not an option at this time especially since patient is not willing to go for treatment.  Explained that patient can be discharged to a homeless shelter.  Sister in law states she and no other family members are willing to take care of him due to his continued drinking.  She also reports that the man he was living with dropped off his belongings at her house after he kicked him out.

## 2015-08-04 NOTE — ED Notes (Addendum)
Informed Dr. Shaune Pollack that patient continues to be tachycardic and is not eating and drinking adequately and could possibly be dehydrated.  Patient's tremors have subsided since receiving PO Ativan. TTS helping with discharge disposition instead of SW.

## 2015-08-04 NOTE — ED Notes (Signed)
Report from Keo, RN, plan to hold pt until morning for sober discharge

## 2015-08-04 NOTE — ED Notes (Signed)
Per verbal order from Dr. Shaune Pollack, patient may discharge.

## 2015-08-04 NOTE — Discharge Instructions (Signed)
Return to emergency room for any worsening condition including any altered mental status, seizure, chest pain, weakness or numbness, or any other symptoms concerning to you. Recommend follow-up with orthopedic for your arm fracture. You may wear a sling as needed. Limited to no lifting of that arm until healed, follow-up with orthopedics in about one week.  You're also referred for primary care physician follow-up at [facility], as well as Behavioral Health/substance abuse evaluation at [facility].   Alcohol Intoxication Alcohol intoxication occurs when you drink enough alcohol that it affects your ability to function. It can be mild or very severe. Drinking a lot of alcohol in a short time is called binge drinking. This can be very harmful. Drinking alcohol can also be more dangerous if you are taking medicines or other drugs. Some of the effects caused by alcohol may include:  Loss of coordination.  Changes in mood and behavior.  Unclear thinking.  Trouble talking (slurred speech).  Throwing up (vomiting).  Confusion.  Slowed breathing.  Twitching and shaking (seizures).  Loss of consciousness. HOME CARE  Do not drive after drinking alcohol.  Drink enough water and fluids to keep your pee (urine) clear or pale yellow. Avoid caffeine.  Only take medicine as told by your doctor. GET HELP IF:  You throw up (vomit) many times.  You do not feel better after a few days.  You frequently have alcohol intoxication. Your doctor can help decide if you should see a substance use treatment counselor. GET HELP RIGHT AWAY IF:  You become shaky when you stop drinking.  You have twitching and shaking.  You throw up blood. It may look bright red or like coffee grounds.  You notice blood in your poop (bowel movements).  You become lightheaded or pass out (faint). MAKE SURE YOU:   Understand these instructions.  Will watch your condition.  Will get help right away if you are not  doing well or get worse.   This information is not intended to replace advice given to you by your health care provider. Make sure you discuss any questions you have with your health care provider.   Document Released: 09/18/2007 Document Revised: 12/02/2012 Document Reviewed: 09/04/2012 Elsevier Interactive Patient Education 2016 Elsevier Inc.   Humerus Fracture Treated With Immobilization The humerus is the large bone in the upper arm. A broken (fractured) humerus is often treated by wearing a cast, splint, or sling (immobilization). This holds the broken pieces in place so they can heal.  HOME CARE  Put ice on the injured area.  Put ice in a plastic bag.  Place a towel between your skin and the bag.  Leave the ice on for 15-20 minutes, 03-04 times a day.  If you are given a cast:  Do not scratch the skin under the cast.  Check the skin around the cast every day. You may put lotion on any red or sore areas.  Keep the cast dry and clean.  If you are given a splint:  Wear the splint as told.  Keep the splint clean and dry.  Loosen the elastic around the splint if your fingers become numb, cold, tingle, or turn blue.  If you are given a sling:  Wear the sling as told.  Do not put pressure on any part of the cast or splint until it is fully hardened.  The cast or splint must be protected with a plastic bag during bathing. Do not lower the cast or splint into water.  Only take medicine as told by your doctor.  Do exercises as told by your doctor.  Follow up as told by your doctor. GET HELP RIGHT AWAY IF:   Your skin or fingernails turn blue or gray.  Your arm feels cold or numb.  You have very bad pain in the injured arm.  You are having problems with the medicines you were given. MAKE SURE YOU:   Understand these instructions.  Will watch your condition.  Will get help right away if you are not doing well or get worse.   This information is not  intended to replace advice given to you by your health care provider. Make sure you discuss any questions you have with your health care provider.   Document Released: 09/18/2007 Document Revised: 04/22/2014 Document Reviewed: 08/24/2014 Elsevier Interactive Patient Education [employer]

## 2015-08-04 NOTE — ED Provider Notes (Signed)
Patient seen by Jerilynn Som with psychiatry, no additional placement opportunities available as patient does not want any help with his drinking.  His family does not want to be involved in terms of caring for him or picking him up. Patient will be discharged on his own.  He was given Ativan here for some signs of mild alcohol withdrawal. His heart rate is down to 105, he is not having tremors now.    Governor Rooks, MD 08/04/15 [phone]

## 2015-08-04 NOTE — ED Notes (Signed)
Patient resting quietly with eyes closed.  Patient given neb tx. Patient states he is not hungry at this time and states he hasn't been able to get much sleep today.  No tremors noticed at this time.

## 2015-09-02 ENCOUNTER — Emergency Department: Payer: Medicaid - Out of State

## 2015-09-02 ENCOUNTER — Inpatient Hospital Stay
Admission: EM | Admit: 2015-09-02 | Discharge: 2015-09-04 | DRG: 641 | Disposition: A | Discharge status: Home / Self Care | Payer: Medicaid - Out of State | Attending: Internal Medicine | Admitting: Internal Medicine

## 2015-09-02 ENCOUNTER — Encounter: Payer: Self-pay | Admitting: Emergency Medicine

## 2015-09-02 DIAGNOSIS — Z833 Family history of diabetes mellitus: Secondary | ICD-10-CM

## 2015-09-02 DIAGNOSIS — E876 Hypokalemia: Secondary | ICD-10-CM | POA: Diagnosis present

## 2015-09-02 DIAGNOSIS — E871 Hypo-osmolality and hyponatremia: Secondary | ICD-10-CM | POA: Diagnosis not present

## 2015-09-02 DIAGNOSIS — E860 Dehydration: Secondary | ICD-10-CM | POA: Diagnosis not present

## 2015-09-02 DIAGNOSIS — I10 Essential (primary) hypertension: Secondary | ICD-10-CM | POA: Diagnosis present

## 2015-09-02 DIAGNOSIS — Z8249 Family history of ischemic heart disease and other diseases of the circulatory system: Secondary | ICD-10-CM | POA: Diagnosis not present

## 2015-09-02 DIAGNOSIS — F1010 Alcohol abuse, uncomplicated: Secondary | ICD-10-CM | POA: Diagnosis present

## 2015-09-02 DIAGNOSIS — Z886 Allergy status to analgesic agent status: Secondary | ICD-10-CM | POA: Diagnosis not present

## 2015-09-02 DIAGNOSIS — F329 Major depressive disorder, single episode, unspecified: Secondary | ICD-10-CM | POA: Diagnosis present

## 2015-09-02 DIAGNOSIS — F17210 Nicotine dependence, cigarettes, uncomplicated: Secondary | ICD-10-CM | POA: Diagnosis present

## 2015-09-02 DIAGNOSIS — J45909 Unspecified asthma, uncomplicated: Secondary | ICD-10-CM | POA: Diagnosis present

## 2015-09-02 DIAGNOSIS — Z9889 Other specified postprocedural states: Secondary | ICD-10-CM | POA: Diagnosis not present

## 2015-09-02 DIAGNOSIS — Z79899 Other long term (current) drug therapy: Secondary | ICD-10-CM

## 2015-09-02 DIAGNOSIS — K219 Gastro-esophageal reflux disease without esophagitis: Secondary | ICD-10-CM | POA: Diagnosis present

## 2015-09-02 DIAGNOSIS — R531 Weakness: Secondary | ICD-10-CM

## 2015-09-02 DIAGNOSIS — J449 Chronic obstructive pulmonary disease, unspecified: Secondary | ICD-10-CM | POA: Diagnosis present

## 2015-09-02 HISTORY — DX: Chronic obstructive pulmonary disease, unspecified: J44.9

## 2015-09-02 LAB — BASIC METABOLIC PANEL
ANION GAP: 10 (ref 5–15)
BUN: 6 mg/dL (ref 6–20)
CALCIUM: 8.7 mg/dL — AB (ref 8.9–10.3)
CO2: 27 mmol/L (ref 22–32)
CREATININE: 0.75 mg/dL (ref 0.61–1.24)
Chloride: 91 mmol/L — ABNORMAL LOW (ref 101–111)
Glucose, Bld: 103 mg/dL — ABNORMAL HIGH (ref 65–99)
Potassium: 3.3 mmol/L — ABNORMAL LOW (ref 3.5–5.1)
SODIUM: 128 mmol/L — AB (ref 135–145)

## 2015-09-02 LAB — CBC
HCT: 36.7 % — ABNORMAL LOW (ref 40.0–52.0)
Hemoglobin: 12.2 g/dL — ABNORMAL LOW (ref 13.0–18.0)
MCH: 30.7 pg (ref 26.0–34.0)
MCHC: 33.4 g/dL (ref 32.0–36.0)
MCV: 92 fL (ref 80.0–100.0)
PLATELETS: 279 10*3/uL (ref 150–440)
RBC: 3.99 MIL/uL — AB (ref 4.40–5.90)
RDW: 14.7 % — ABNORMAL HIGH (ref 11.5–14.5)
WBC: 7.8 10*3/uL (ref 3.8–10.6)

## 2015-09-02 LAB — TROPONIN I

## 2015-09-02 MED ORDER — SODIUM CHLORIDE 0.9 % IV BOLUS (SEPSIS)
1000.0000 mL | Freq: Once | INTRAVENOUS | Status: AC
  Administered 2015-09-02: 1000 mL via INTRAVENOUS

## 2015-09-02 MED ORDER — IPRATROPIUM-ALBUTEROL 0.5-2.5 (3) MG/3ML IN SOLN
3.0000 mL | Freq: Once | RESPIRATORY_TRACT | Status: AC
  Administered 2015-09-02: 3 mL via RESPIRATORY_TRACT
  Filled 2015-09-02: qty 3

## 2015-09-02 NOTE — ED Notes (Signed)
Pt. Ambulated down hallway, O2 sats stayed above 96 %.

## 2015-09-02 NOTE — ED Notes (Addendum)
Pt c/o generalized weakness for about 2 weeks; "I feel like I'm going to die"; "I'm hoping y'all keep me"; pt says his legs feel shaky and weak when walking; says he can walk down to the store but doesn't have enough energy to get back; intermittent chest pain over the last 2 weeks, towards left side; pt unable to describe chest pain other than to say "it comes and goes"; pt also c/o right arm/shoulder pain; seen here on 08/03/15 following a fall while being intoxicated; xray states "Comminuted and displaced proximal humeral meta diaphyseal fractures, potentially involving the surgical neck"; pt says he was not aware he had a broken arm, "I just know it hurts"; pt does not read or write and will need explicit verbal details on any follow up; pt lives with his brother in [place] but has not help from him-was told today he would need to find a new place to live; pt says never followed up after his last visit as he has no ride anywhere;

## 2015-09-02 NOTE — ED Notes (Signed)
Rt. Arm sling put on pt. For previous injury.

## 2015-09-02 NOTE — ED Notes (Signed)
Ride information Brother Cindee Lame [phone] and [place] [phone]

## 2015-09-02 NOTE — ED Notes (Signed)
At the end of the triage process, when asked about thoughts of hurting himself, pt says sometimes he does but that was over a year ago; currently feeling depressed;

## 2015-09-02 NOTE — ED Provider Notes (Signed)
[facility] Emergency Department Provider Note    ____________________________________________  Time seen: ~2235  I have reviewed the triage vital signs and the nursing notes.   HISTORY  Chief Complaint Weakness and Depression   History limited by: Not Limited   HPI Caleb Carpenter is a 60 y.o. male who presents to the emergency department with somewhat vague complaints. It seems that the main complaint today is difficulty with walking. He says that he will walk a couple of blocks to the store by the time he gets back and feels like his legs are giving out beneath him. He also complains of hip pain. It is unclear quite how much the hip pain is playing a role of his legs going out. He additionally states that he feels like he is become much weaker recently. Furthermore he does live with his family although it sounds like that is not an ideal living situation for him. He does have a history of depression although denies any current thoughts of wanting to hurt himself. He says that he is drinking but not every day. Today he had one beer.    Past Medical History  Diagnosis Date  . Hypertension   . Asthma   . COPD (chronic obstructive pulmonary disease) [facility])     Patient Active Problem List   Diagnosis Date Noted  . Acute posthemorrhagic anemia 06/23/2015  . Hypomagnesemia 06/23/2015  . Leukocytosis 06/23/2015  . Alcohol abuse 06/23/2015  . Thrombocytopenia (HCC) 06/23/2015  . Upper GI bleed 06/21/2015  . Reflux esophagitis   . Gastritis   . Duodenal ulceration     Past Surgical History  Procedure Laterality Date  . Esophagogastroduodenoscopy (egd) with propofol N/A 06/21/2015    Procedure: ESOPHAGOGASTRODUODENOSCOPY (EGD) WITH PROPOFOL;  Surgeon: Midge Minium, MD;  Location: [facility] ENDOSCOPY;  Service: Endoscopy;  Laterality: N/A;    Current Outpatient Rx  Name  Route  Sig  Dispense  Refill  . albuterol (PROVENTIL HFA;VENTOLIN HFA) 108 (90 Base) MCG/ACT  inhaler   Inhalation   Inhale 2 puffs into the lungs 2 (two) times daily.         . folic acid (FOLVITE) 1 MG tablet   Oral   Take 1 mg by mouth 2 (two) times daily.         Marland Kitchen lisinopril (PRINIVIL,ZESTRIL) 20 MG tablet   Oral   Take 1 tablet (20 mg total) by mouth daily.   30 tablet   5   . metoprolol tartrate (LOPRESSOR) 25 MG tablet   Oral   Take 1 tablet (25 mg total) by mouth 2 (two) times daily.   60 tablet   6   . pantoprazole (PROTONIX) 40 MG tablet   Oral   Take 1 tablet (40 mg total) by mouth 2 (two) times daily.   60 tablet   5     Allergies Codeine  History reviewed. No pertinent family history.  Social History Social History  Substance Use Topics  . Smoking status: Current Some Day Smoker    Types: Cigarettes    Last Attempt to Quit: 06/21/2014  . Smokeless tobacco: Current User    Types: Chew  . Alcohol Use: 1.2 oz/week    2 Cans of beer per week     Comment: per day    Review of Systems  Constitutional: Negative for fever. Cardiovascular: Negative for chest pain. Respiratory: Negative for shortness of breath. Gastrointestinal: Negative for abdominal pain, vomiting and diarrhea. Neurological: Negative for headaches, focal  weakness or numbness.  10-point ROS otherwise negative.  ____________________________________________   PHYSICAL EXAM:  VITAL SIGNS: ED Triage Vitals  Enc Vitals Group     BP 09/02/15 2058 117/96 mmHg     Pulse Rate 09/02/15 2058 83     Resp 09/02/15 2058 18     Temp 09/02/15 2058 98.2 F (36.8 C)     Temp Source 09/02/15 2058 Oral     SpO2 09/02/15 2058 96 %     Weight 09/02/15 2058 103 lb (46.72 kg)     Height 09/02/15 2058 5\' 5"  (1.651 m)   Constitutional: Alert and oriented. Cachectic. Chronically ill appearing. Eyes: Conjunctivae are normal. PERRL. Normal extraocular movements. ENT   Head: Normocephalic and atraumatic.   Nose: No congestion/rhinnorhea.   Mouth/Throat: Mucous membranes are  moist.   Neck: No stridor. Hematological/Lymphatic/Immunilogical: No cervical lymphadenopathy. Cardiovascular: Normal rate, regular rhythm.  Diffuse expiratory wheezing. Respiratory: Normal respiratory effort without tachypnea nor retractions. Breath sounds are clear and equal bilaterally. No wheezes/rales/rhonchi. Gastrointestinal: Soft and nontender. No distention. Genitourinary: Deferred Musculoskeletal: Normal range of motion in all extremities. No joint effusions.  No lower extremity tenderness nor edema. Neurologic:  Normal speech and language. No gross focal neurologic deficits are appreciated.  Skin:  Skin is warm, dry and intact. No rash noted. Psychiatric: Mood and affect are normal. Speech and behavior are normal. Patient exhibits appropriate insight and judgment.  ____________________________________________    LABS (pertinent positives/negatives)  Labs Reviewed  BASIC METABOLIC PANEL - Abnormal; Notable for the following:    Sodium 128 (*)    Potassium 3.3 (*)    Chloride 91 (*)    Glucose, Bld 103 (*)    Calcium 8.7 (*)    All other components within normal limits  CBC - Abnormal; Notable for the following:    RBC 3.99 (*)    Hemoglobin 12.2 (*)    HCT 36.7 (*)    RDW 14.7 (*)    All other components within normal limits  TROPONIN I     ____________________________________________   EKG  I, Phineas Semen, attending physician, personally viewed and interpreted this EKG  EKG Time: 2053 Rate: 83 Rhythm: normal sinus rhythm Axis: normal Intervals: qtc 458 QRS: narrow, q waves V1-V3 ST changes: no st elevation Impression: abnormal ekg   ____________________________________________    RADIOLOGY  CXR  IMPRESSION: Emphysema and hyperinflation. Right proximal humeral fracture first seen in April 2017. No other acute abnormalities.   ____________________________________________   PROCEDURES  Procedure(s) performed: None  Critical Care  performed: No  ____________________________________________   INITIAL IMPRESSION / ASSESSMENT AND PLAN / ED COURSE  Pertinent labs & imaging results that were available during my care of the patient were reviewed by me and considered in my medical decision making (see chart for details).  Patient presented to the emergency department today because of somewhat vague complaints however primary complaint is weakness with his legs went out from under him. On exam patient is chronically ill-appearing and looks much older than his stated age. He did have diffuse wheezing and states he does have a history of COPD. Blood work shows a slight hyponatremia. Will plan on giving IV fluids. Will give DuoNeb treatment. Furthermore the patient states that he was unaware that he broke his right arm. Per previous visit notes there was a plan to put the arm in the sling.  ____________________________________________   FINAL CLINICAL IMPRESSION(S) / ED DIAGNOSES  Final diagnoses:  Hyponatremia  Weakness  Note: This dictation was prepared with Dragon dictation. Any transcriptional errors that result from this process are unintentional    Phineas Semen, MD 09/02/15 2324

## 2015-09-02 NOTE — ED Notes (Signed)
Pt. Presents into room #5 with cough and complaint of weakness.  Pt. Is not good historian on home care.  Pt. States he has had a cough for "awhile".  Pt states weakness with exhaustion.  Pt. States injury to rt. Arm about a month ago that has not improved. Pt. Brother came back to room to visit patient and states he can be called for a ride for patient.

## 2015-09-03 DIAGNOSIS — R531 Weakness: Secondary | ICD-10-CM | POA: Insufficient documentation

## 2015-09-03 LAB — BASIC METABOLIC PANEL
Anion gap: 7 (ref 5–15)
BUN: <5 mg/dL — ABNORMAL LOW (ref 6–20)
CALCIUM: 7.9 mg/dL — AB (ref 8.9–10.3)
CHLORIDE: 100 mmol/L — AB (ref 101–111)
CO2: 24 mmol/L (ref 22–32)
CREATININE: 0.47 mg/dL — AB (ref 0.61–1.24)
Glucose, Bld: 81 mg/dL (ref 65–99)
Potassium: 3.2 mmol/L — ABNORMAL LOW (ref 3.5–5.1)
SODIUM: 131 mmol/L — AB (ref 135–145)

## 2015-09-03 LAB — CBC
HCT: 31.1 % — ABNORMAL LOW (ref 40.0–52.0)
Hemoglobin: 10.7 g/dL — ABNORMAL LOW (ref 13.0–18.0)
MCH: 31.9 pg (ref 26.0–34.0)
MCHC: 34.3 g/dL (ref 32.0–36.0)
MCV: 93.2 fL (ref 80.0–100.0)
PLATELETS: 211 10*3/uL (ref 150–440)
RBC: 3.34 MIL/uL — AB (ref 4.40–5.90)
RDW: 14.7 % — ABNORMAL HIGH (ref 11.5–14.5)
WBC: 4.7 10*3/uL (ref 3.8–10.6)

## 2015-09-03 MED ORDER — FOLIC ACID 1 MG PO TABS
1.0000 mg | ORAL_TABLET | Freq: Every day | ORAL | Status: DC
  Administered 2015-09-03 – 2015-09-04 (×2): 1 mg via ORAL
  Filled 2015-09-03 (×2): qty 1

## 2015-09-03 MED ORDER — ACETAMINOPHEN 325 MG PO TABS
650.0000 mg | ORAL_TABLET | Freq: Four times a day (QID) | ORAL | Status: DC | PRN
  Administered 2015-09-03 – 2015-09-04 (×3): 650 mg via ORAL
  Filled 2015-09-03 (×4): qty 2

## 2015-09-03 MED ORDER — THIAMINE HCL 100 MG/ML IJ SOLN
100.0000 mg | Freq: Every day | INTRAMUSCULAR | Status: DC
  Filled 2015-09-03: qty 2

## 2015-09-03 MED ORDER — ADULT MULTIVITAMIN W/MINERALS CH
1.0000 | ORAL_TABLET | Freq: Every day | ORAL | Status: DC
  Administered 2015-09-03 – 2015-09-04 (×2): 1 via ORAL
  Filled 2015-09-03: qty 1

## 2015-09-03 MED ORDER — SODIUM CHLORIDE 0.9% FLUSH
3.0000 mL | Freq: Two times a day (BID) | INTRAVENOUS | Status: DC
  Administered 2015-09-04: 3 mL via INTRAVENOUS

## 2015-09-03 MED ORDER — LORAZEPAM 1 MG PO TABS: 1.0000 mg | ORAL_TABLET | Freq: Four times a day (QID) | ORAL | Status: DC | PRN

## 2015-09-03 MED ORDER — POTASSIUM CHLORIDE CRYS ER 20 MEQ PO TBCR
40.0000 meq | EXTENDED_RELEASE_TABLET | Freq: Once | ORAL | Status: AC
  Administered 2015-09-03: 40 meq via ORAL
  Filled 2015-09-03: qty 2

## 2015-09-03 MED ORDER — ONDANSETRON HCL 4 MG/2ML IJ SOLN: 4.0000 mg | Freq: Four times a day (QID) | INTRAMUSCULAR | Status: DC | PRN

## 2015-09-03 MED ORDER — VITAMIN B-1 100 MG PO TABS
100.0000 mg | ORAL_TABLET | Freq: Every day | ORAL | Status: DC
  Administered 2015-09-03 – 2015-09-04 (×2): 100 mg via ORAL
  Filled 2015-09-03 (×3): qty 1

## 2015-09-03 MED ORDER — SODIUM CHLORIDE 0.9 % IV SOLN
INTRAVENOUS | Status: DC
  Administered 2015-09-03: 02:00:00 via INTRAVENOUS

## 2015-09-03 MED ORDER — PANTOPRAZOLE SODIUM 40 MG PO TBEC
40.0000 mg | DELAYED_RELEASE_TABLET | Freq: Two times a day (BID) | ORAL | Status: DC
  Administered 2015-09-03 – 2015-09-04 (×3): 40 mg via ORAL
  Filled 2015-09-03 (×3): qty 1

## 2015-09-03 MED ORDER — ENOXAPARIN SODIUM 40 MG/0.4ML ~~LOC~~ SOLN
40.0000 mg | SUBCUTANEOUS | Status: DC
  Administered 2015-09-03: 40 mg via SUBCUTANEOUS
  Filled 2015-09-03: qty 0.4

## 2015-09-03 MED ORDER — LORAZEPAM 2 MG/ML IJ SOLN: 1.0000 mg | Freq: Four times a day (QID) | INTRAMUSCULAR | Status: DC | PRN

## 2015-09-03 MED ORDER — LORAZEPAM 2 MG PO TABS: 0.0000 mg | ORAL_TABLET | Freq: Two times a day (BID) | ORAL | Status: DC

## 2015-09-03 MED ORDER — ACETAMINOPHEN 650 MG RE SUPP
650.0000 mg | Freq: Four times a day (QID) | RECTAL | Status: DC | PRN
Start: 2015-09-03 — End: 2015-09-04

## 2015-09-03 MED ORDER — ONDANSETRON HCL 4 MG PO TABS: 4.0000 mg | ORAL_TABLET | Freq: Four times a day (QID) | ORAL | Status: DC | PRN

## 2015-09-03 MED ORDER — LORAZEPAM 2 MG PO TABS
0.0000 mg | ORAL_TABLET | Freq: Four times a day (QID) | ORAL | Status: DC
Start: 2015-09-03 — End: 2015-09-04
  Administered 2015-09-04 (×2): 2 mg via ORAL
  Filled 2015-09-03 (×2): qty 1

## 2015-09-03 MED ORDER — POTASSIUM CHLORIDE IN NACL 20-0.9 MEQ/L-% IV SOLN
INTRAVENOUS | Status: DC
  Administered 2015-09-03 (×2): via INTRAVENOUS
  Filled 2015-09-03 (×4): qty 1000

## 2015-09-03 MED ORDER — MIRTAZAPINE 15 MG PO TABS
15.0000 mg | ORAL_TABLET | Freq: Every day | ORAL | Status: DC
  Administered 2015-09-03: 15 mg via ORAL
  Filled 2015-09-03: qty 1

## 2015-09-03 NOTE — H&P (Signed)
[facility] - [place] at [facility]   PATIENT NAME: Caleb Carpenter    MR#:  [id number]  DATE OF BIRTH:  1956-02-19  DATE OF ADMISSION:  09/02/2015  PRIMARY CARE PHYSICIAN: No PCP Per Patient   REQUESTING/REFERRING PHYSICIAN: Derrill Kay, MD  CHIEF COMPLAINT:   Chief Complaint  Patient presents with  . Weakness  . Depression    HISTORY OF PRESENT ILLNESS:  Caleb Carpenter  is a 60 y.o. male who presents with Generalized weakness. Patient states that this is being present for about the past week, getting slowly but progressively worse. On evaluation in the ED today is found to be hyponatremic with a sodium of 128, down from 137 about a month ago. No other significant abnormality elicited on initial workup. Hospitalists called for admission. Of note, patient does have a significant history of alcohol use, though he states he has not been drinking much lately.  PAST MEDICAL HISTORY:   Past Medical History  Diagnosis Date  . Hypertension   . Asthma   . COPD (chronic obstructive pulmonary disease) (HCC)     PAST SURGICAL HISTORY:   Past Surgical History  Procedure Laterality Date  . Esophagogastroduodenoscopy (egd) with propofol N/A 06/21/2015    Procedure: ESOPHAGOGASTRODUODENOSCOPY (EGD) WITH PROPOFOL;  Surgeon: Midge Minium, MD;  Location: [facility] ENDOSCOPY;  Service: Endoscopy;  Laterality: N/A;    SOCIAL HISTORY:   Social History  Substance Use Topics  . Smoking status: Current Some Day Smoker    Types: Cigarettes    Last Attempt to Quit: 06/21/2014  . Smokeless tobacco: Current User    Types: Chew  . Alcohol Use: 1.2 oz/week    2 Cans of beer per week     Comment: per day    FAMILY HISTORY:   Family History  Problem Relation Age of Onset  . Diabetes    . Hypertension      DRUG ALLERGIES:   Allergies  Allergen Reactions  . Codeine     MEDICATIONS AT HOME:   Prior to Admission medications   Medication Sig Start Date End Date Taking?  Authorizing Provider  albuterol (PROVENTIL HFA;VENTOLIN HFA) 108 (90 Base) MCG/ACT inhaler Inhale 2 puffs into the lungs 2 (two) times daily.   Yes Historical Provider, MD  folic acid (FOLVITE) 1 MG tablet Take 1 mg by mouth 2 (two) times daily.   Yes Historical Provider, MD  lisinopril (PRINIVIL,ZESTRIL) 20 MG tablet Take 1 tablet (20 mg total) by mouth daily. 06/23/15  Yes Katharina Caper, MD  metoprolol tartrate (LOPRESSOR) 25 MG tablet Take 1 tablet (25 mg total) by mouth 2 (two) times daily. 06/23/15  Yes Katharina Caper, MD  pantoprazole (PROTONIX) 40 MG tablet Take 1 tablet (40 mg total) by mouth 2 (two) times daily. 06/23/15   Katharina Caper, MD    REVIEW OF SYSTEMS:  Review of Systems  Constitutional: Positive for malaise/fatigue. Negative for fever, chills and weight loss.  HENT: Negative for ear pain, hearing loss and tinnitus.   Eyes: Negative for blurred vision, double vision, pain and redness.  Respiratory: Negative for cough, hemoptysis and shortness of breath.   Cardiovascular: Negative for chest pain, palpitations, orthopnea and leg swelling.  Gastrointestinal: Negative for nausea, vomiting, abdominal pain, diarrhea and constipation.  Genitourinary: Negative for dysuria, frequency and hematuria.  Musculoskeletal: Negative for back pain, joint pain and neck pain.  Skin:       No acne, rash, or lesions  Neurological: Positive for weakness. Negative for dizziness, tremors  and focal weakness.  Endo/Heme/Allergies: Negative for polydipsia. Does not bruise/bleed easily.  Psychiatric/Behavioral: Negative for depression. The patient is not nervous/anxious and does not have insomnia.      VITAL SIGNS:   Filed Vitals:   09/02/15 2058  BP: 117/96  Pulse: 83  Temp: 98.2 F (36.8 C)  TempSrc: Oral  Resp: 18  Height:  (1.651 m)  Weight: 46.72 kg (103 lb)  SpO2: 96%   Wt Readings from Last 3 Encounters:  09/02/15 46.72 kg (103 lb)  08/03/15 51.7 kg (113 lb 15.7 oz)  07/06/15  48.535 kg (107 lb)    PHYSICAL EXAMINATION:  Physical Exam  Vitals reviewed. Constitutional: He is oriented to person, place, and time. He appears well-developed and well-nourished. No distress.  HENT:  Head: Normocephalic and atraumatic.  Mouth/Throat: Oropharynx is clear and moist.  Eyes: Conjunctivae and EOM are normal. Pupils are equal, round, and reactive to light. No scleral icterus.  Neck: Normal range of motion. Neck supple. No JVD present. No thyromegaly present.  Cardiovascular: Normal rate, regular rhythm and intact distal pulses.  Exam reveals no gallop and no friction rub.   No murmur heard. Respiratory: Effort normal and breath sounds normal. No respiratory distress. He has no wheezes. He has no rales.  GI: Soft. Bowel sounds are normal. He exhibits no distension. There is no tenderness.  Musculoskeletal: Normal range of motion. He exhibits no edema.  No arthritis, no gout  Lymphadenopathy:    He has no cervical adenopathy.  Neurological: He is alert and oriented to person, place, and time. No cranial nerve deficit.  No dysarthria, no aphasia  Skin: Skin is warm and dry. No rash noted. No erythema.  Psychiatric: He has a normal mood and affect. His behavior is normal. Judgment and thought content normal.    LABORATORY PANEL:   CBC  Recent Labs Lab 09/02/15 2115  WBC 7.8  HGB 12.2*  HCT 36.7*  PLT 279   ------------------------------------------------------------------------------------------------------------------  Chemistries   Recent Labs Lab 09/02/15 2115  NA 128*  K 3.3*  CL 91*  CO2 27  GLUCOSE 103*  BUN 6  CREATININE 0.75  CALCIUM 8.7*   ------------------------------------------------------------------------------------------------------------------  Cardiac Enzymes  Recent Labs Lab 09/02/15 2115  TROPONINI <0.03    ------------------------------------------------------------------------------------------------------------------  RADIOLOGY:  Dg Chest 2 View  09/02/2015  CLINICAL DATA:  Weakness. EXAM: CHEST  2 VIEW COMPARISON:  June 21, 2015 FINDINGS: Hyperinflation of lungs consistent with emphysema. The cardiomediastinal silhouette is stable. No pulmonary nodules, masses, or infiltrates. Healed left rib fractures are identified. There is a right proximal humeral fracture. This was first seen in April of 2017. IMPRESSION: Emphysema and hyperinflation. Right proximal humeral fracture first seen in April 2017. No other acute abnormalities. Electronically Signed   By: Gerome Sam III M.D   On: 09/02/2015 21:52    EKG:   Orders placed or performed during the hospital encounter of 09/02/15  . EKG 12-Lead  . EKG 12-Lead  . ED EKG within 10 minutes  . ED EKG within 10 minutes    IMPRESSION AND PLAN:  Principal Problem:   Generalized weakness - likely due to his hyponatremia, Which while not extremely low certainly significantly lower than his value one month ago. This is likely due to some dehydration. We will get PT to evaluate him in the morning, we will treat his other problems as below. Active Problems:   Hyponatremia - mildly low, though potentially significantly decreased for this patient. We'll start with  IV hydration with normal saline and monitor for improvement.   Alcohol abuse - CIWA protocol, the patient states he has not been drinking much lately.   HTN (hypertension) - currently borderline low, hold antihypertensives for now.   GERD (gastroesophageal reflux disease) - home dose PPI  All the records are reviewed and case discussed with ED provider. Management plans discussed with the patient and/or family.  DVT PROPHYLAXIS: SubQ lovenox  GI PROPHYLAXIS: PPI  ADMISSION STATUS: Inpatient  CODE STATUS: Full Code Status History    Date Active Date Inactive Code Status Order ID  Comments User Context   06/21/2015  9:01 AM 06/23/2015  8:05 PM Full Code [id number]  Katha Hamming, MD ED      TOTAL TIME TAKING CARE OF THIS PATIENT: 45 minutes.    Laryn Venning FIELDING 09/03/2015, 12:09 AM  [facility]  Office  [phone]  CC: Primary care physician; No PCP Per Patient

## 2015-09-03 NOTE — Progress Notes (Signed)
[facility] - [place] at [facility]   PATIENT NAME: Caleb Carpenter    MR#:  [id number]  DATE OF BIRTH:  [birth year]  SUBJECTIVE:  CHIEF COMPLAINT:   Chief Complaint  Patient presents with  . Weakness  . Depression   -Admitted for weakness. Poor by mouth intake. Feeling some stronger. -Electrolytes are low and being replaced  REVIEW OF SYSTEMS:  Review of Systems  Constitutional: Positive for weight loss. Negative for fever and chills.  HENT: Negative for ear discharge, ear pain and nosebleeds.   Eyes: Negative for blurred vision and double vision.  Respiratory: Negative for cough, shortness of breath and wheezing.   Cardiovascular: Negative for chest pain, palpitations and leg swelling.  Gastrointestinal: Negative for nausea, vomiting, abdominal pain, diarrhea and constipation.  Genitourinary: Negative for dysuria and urgency.  Musculoskeletal: Negative for myalgias.  Neurological: Positive for weakness. Negative for dizziness, speech change, focal weakness, seizures and headaches.  Psychiatric/Behavioral: Negative for depression.    DRUG ALLERGIES:   Allergies  Allergen Reactions  . Codeine     VITALS:  Blood pressure 100/56, pulse 66, temperature 98.2 F (36.8 C), temperature source Oral, resp. rate 20, height  (1.651 m), weight 49.578 kg (109 lb 4.8 oz), SpO2 100 %.  PHYSICAL EXAMINATION:  Physical Exam  GENERAL:  60 y.o.-year-old thin patient , appears malnourished, lying in the bed with no acute distress. Disheleved EYES: Pupils equal, round, reactive to light and accommodation. No scleral icterus. Extraocular muscles intact.  HEENT: Head atraumatic, normocephalic. Oropharynx and nasopharynx clear.  NECK:  Supple, no jugular venous distention. No thyroid enlargement, no tenderness.  LUNGS: Normal breath sounds bilaterally, no wheezing, rales,rhonchi or crepitation. No use of accessory muscles of respiration.  CARDIOVASCULAR: S1, S2  normal. No murmurs, rubs, or gallops.  ABDOMEN: Soft, nontender, nondistended. Bowel sounds present. No organomegaly or mass.  EXTREMITIES: No pedal edema, cyanosis, or clubbing.  NEUROLOGIC: Cranial nerves II through XII are intact. Muscle strength 5/5 in all extremities. Sensation intact. Gait not checked.  PSYCHIATRIC: The patient is alert and oriented x 3.  SKIN: No obvious rash, lesion, or ulcer.    LABORATORY PANEL:   CBC  Recent Labs Lab 09/03/15 0554  WBC 4.7  HGB 10.7*  HCT 31.1*  PLT 211   ------------------------------------------------------------------------------------------------------------------  Chemistries   Recent Labs Lab 09/03/15 0554  NA 131*  K 3.2*  CL 100*  CO2 24  GLUCOSE 81  BUN <5*  CREATININE 0.47*  CALCIUM 7.9*   ------------------------------------------------------------------------------------------------------------------  Cardiac Enzymes  Recent Labs Lab 09/02/15 2115  TROPONINI <0.03   ------------------------------------------------------------------------------------------------------------------  RADIOLOGY:  Dg Chest 2 View  09/02/2015  CLINICAL DATA:  Weakness. EXAM: CHEST  2 VIEW COMPARISON:  June 21, 2015 FINDINGS: Hyperinflation of lungs consistent with emphysema. The cardiomediastinal silhouette is stable. No pulmonary nodules, masses, or infiltrates. Healed left rib fractures are identified. There is a right proximal humeral fracture. This was first seen in April of 2017. IMPRESSION: Emphysema and hyperinflation. Right proximal humeral fracture first seen in April 2017. No other acute abnormalities. Electronically Signed   By: Gerome Sam III M.D   On: 09/02/2015 21:52    EKG:   Orders placed or performed during the hospital encounter of 09/02/15  . EKG 12-Lead  . EKG 12-Lead  . ED EKG within 10 minutes  . ED EKG within 10 minutes    ASSESSMENT AND PLAN:   60 year old male with past medical history  significant for hypertension and COPD  presented to the emergency room secondary to weakness.  #1 generalized weakness-secondary to dehydration and poor by mouth intake. -Electrolytes were low and being replaced. -Continue IV fluids and physical therapy consult today  #2 hyponatremia and hypokalemia-being replaced appropriately. Follow up in a.m.  #3 depression-no suicidal or homicidal ideation.  just feels withdrawn. -Started on Remeron. Outpatient psychiatric follow-up  #4 alcohol abuse-no recent drinking. Monitor on CIWA protocol  #5 hypertension-blood pressure is borderline low at this time. Hold his lisinopril and metoprolol.  #6 GERD-continue Protonix twice a day  #7 DVT prophylaxis-on Lovenox   Possible discharge tomorrow    All the records are reviewed and case discussed with Care Management/Social Workerr. Management plans discussed with the patient, family and they are in agreement.  CODE STATUS: Full Code  TOTAL TIME TAKING CARE OF THIS PATIENT: 37 minutes.   POSSIBLE D/C IN 1 DAY, DEPENDING ON CLINICAL CONDITION.   Enid Baas M.D on 09/03/2015 at 12:08 PM  Between 7am to 6pm - Pager - [phone]  After 6pm go to [email or web address] - password [email or web address] [facility]  [facility]  Office  [phone]  CC: Primary care physician; No PCP Per Patient

## 2015-09-03 NOTE — Evaluation (Signed)
Physical Therapy Evaluation Patient Details Name: Caleb Carpenter MRN: [id number] DOB: [birth year] Today's Date: 09/03/2015   History of Present Illness  Pt is a 60 y.o. male presenting with generalized weakness and admitted d/t hyponatremia.  Of note, pt with h/o fall (imaging showed R proximal humerus fx 08/03/15 and placed in sling).  PMH includes htn, COPD, acute posthemorrhagic anemia.  Clinical Impression  Prior to admission, pt was independent ambulating without AD.  Pt lives with his brother and sister-in-law and stays on main level of home with stairs to enter.  Currently pt is CGA with ambulation around nursing station no AD; increased antalgic gait d/t L hip pain noted with distance (no pain at rest and decreased to minimal pain end of session with rest in bed).  Pt would benefit from skilled PT to address noted impairments and functional limitations.  Recommend pt discharge to home when medically appropriate; may benefit from trial of [facility]; may benefit from OP PT for L hip pain.     Follow Up Recommendations  (Follow-up with PCP for OP PT for L hip pain)    Equipment Recommendations   (TBD)    Recommendations for Other Services       Precautions / Restrictions Precautions Precautions: Fall;Shoulder Type of Shoulder Precautions: R arm sling d/t R proximal humerus fx Shoulder Interventions: Shoulder sling/immobilizer Required Braces or Orthoses: Sling Restrictions Weight Bearing Restrictions: Yes RUE Weight Bearing: Non weight bearing      Mobility  Bed Mobility Overal bed mobility: Modified Independent             General bed mobility comments: Supine to/from sit with HOB elevated  Transfers Overall transfer level: Needs assistance Equipment used: None Transfers: Sit to/from Stand Sit to Stand: Supervision;Min guard         General transfer comment: steady without loss of balance  Ambulation/Gait Ambulation/Gait assistance: Min guard Ambulation Distance  (Feet): 200 Feet Assistive device: None Gait Pattern/deviations: Decreased stance time - left;Decreased step length - right;Antalgic Gait velocity: mildly decreased   General Gait Details: antalgic gait with increased distance ambulated  Stairs            Wheelchair Mobility    Modified Rankin (Stroke Patients Only)       Balance Overall balance assessment: Needs assistance Sitting-balance support: Single extremity supported;Feet supported Sitting balance-Leahy Scale: Normal     Standing balance support: No upper extremity supported Standing balance-Leahy Scale: Good Standing balance comment: pt able to standing to toilet without loss of balance                             Pertinent Vitals/Pain Pain Assessment: 0-10 Pain Score: 5  Pain Location: L hip with increased distance ambulated (decreased with sitting rest) Pain Descriptors / Indicators: Sore;Aching Pain Intervention(s): Limited activity within patient's tolerance;Monitored during session;Repositioned  Vitals stable and WFL throughout treatment session.    Home Living Family/patient expects to be discharged to:: Private residence Living Arrangements: Other relatives (Pt's brother and brother's wife) Available Help at Discharge: Family Type of Home: House Home Access: Stairs to enter Entrance Stairs-Rails: None Entrance Stairs-Number of Steps: 3 Home Layout: Two level;Able to live on main level with bedroom/bathroom Home Equipment: None      Prior Function Level of Independence: Independent               Hand Dominance        Extremity/Trunk Assessment  Upper Extremity Assessment: RUE deficits/detail;LUE deficits/detail RUE Deficits / Details: deferred d/t R proximal humerus fx and in sling RUE: Unable to fully assess due to immobilization   LUE Deficits / Details: L UE strength and ROM WFL   Lower Extremity Assessment: Generalized weakness (unable to perform L LE SLR d/t L  hip pain )      Cervical / Trunk Assessment: Normal  Communication   Communication: No difficulties  Cognition Arousal/Alertness: Awake/alert Behavior During Therapy: WFL for tasks assessed/performed Overall Cognitive Status: Within Functional Limits for tasks assessed                      General Comments General comments (skin integrity, edema, etc.): R UE in sling upon arrival but PT adjusted for optimal fit/comfort  Nursing cleared pt for participation in physical therapy.  Pt agreeable to PT session.    Exercises Total Joint Exercises Ankle Circles/Pumps: AROM;Strengthening;Both;10 reps;Supine Quad Sets: AROM;Strengthening;Both;10 reps;Supine Short Arc Quad: AROM;Strengthening;Both;10 reps;Supine Heel Slides: AROM;Strengthening;Both;10 reps;Supine Hip ABduction/ADduction: AROM;Strengthening;Both;10 reps;Supine      Assessment/Plan    PT Assessment Patient needs continued PT services  PT Diagnosis Difficulty walking;Abnormality of gait   PT Problem List Decreased activity tolerance;Decreased balance;Decreased mobility;Pain  PT Treatment Interventions DME instruction;Gait training;Stair training;Functional mobility training;Therapeutic activities;Therapeutic exercise;Balance training;Patient/family education   PT Goals (Current goals can be found in the Care Plan section) Acute Rehab PT Goals Patient Stated Goal: to have less L hip pain with ambulation PT Goal Formulation: With patient Time For Goal Achievement: 09/17/15 Potential to Achieve Goals: Fair    Frequency Min 2X/week   Barriers to discharge        Co-evaluation               End of Session Equipment Utilized During Treatment: Gait belt Activity Tolerance: Patient limited by pain (increasing L hip pain with distance) Patient left: in bed;with call bell/phone within reach;with bed alarm set Nurse Communication: Mobility status;Precautions (Pain with ambulation)         Time:  5051-8335 PT Time Calculation (min) (ACUTE ONLY): 18 min   Charges:   PT Evaluation $PT Eval Low Complexity: 1 Procedure PT Treatments $Therapeutic Exercise: 8-22 mins   PT G CodesHendricks Limes 10/03/15, 10:41 AM Hendricks Limes, PT [phone]

## 2015-09-03 NOTE — ED Notes (Signed)
Pt. Up and using bathroom with no difficulty.  

## 2015-09-04 LAB — BASIC METABOLIC PANEL
ANION GAP: 3 — AB (ref 5–15)
CALCIUM: 8.3 mg/dL — AB (ref 8.9–10.3)
CO2: 24 mmol/L (ref 22–32)
Chloride: 111 mmol/L (ref 101–111)
Creatinine, Ser: 0.48 mg/dL — ABNORMAL LOW (ref 0.61–1.24)
GFR calc Af Amer: >60 mL/min (ref >60)
GFR calc non Af Amer: >60 mL/min (ref >60)
GLUCOSE: 77 mg/dL (ref 65–99)
Potassium: 4.4 mmol/L (ref 3.5–5.1)
Sodium: 138 mmol/L (ref 135–145)

## 2015-09-04 MED ORDER — GUAIFENESIN-DM 100-10 MG/5ML PO SYRP
5.0000 mL | ORAL_SOLUTION | ORAL | Status: DC | PRN
  Administered 2015-09-04: 5 mL via ORAL
  Filled 2015-09-04: qty 5

## 2015-09-04 MED ORDER — MIRTAZAPINE 7.5 MG PO TABS: 7.5000 mg | ORAL_TABLET | Freq: Every day | ORAL | Status: DC

## 2015-09-04 MED ORDER — PANTOPRAZOLE SODIUM 40 MG PO TBEC: 40.0000 mg | DELAYED_RELEASE_TABLET | Freq: Two times a day (BID) | ORAL | Status: DC

## 2015-09-04 NOTE — Progress Notes (Signed)
Primary nurse was notified by telemetry clerk that pt had went into 2nd degree type 1 heart block. . Pt converted back into SR. Pt VSS. Pt asyptomatic. Dr. Doree Albee notified and orders received for a 12 lead.

## 2015-09-04 NOTE — Discharge Summary (Signed)
[facility] [place] [facility]   PATIENT NAME: Caleb Carpenter    MR#:  [id number]  DATE OF BIRTH:  1956/02/20  DATE OF ADMISSION:  09/02/2015 ADMITTING PHYSICIAN: Oralia Manis, MD  DATE OF DISCHARGE: 09/04/15  PRIMARY CARE PHYSICIAN: No PCP Per Patient    ADMISSION DIAGNOSIS:  Hyponatremia [E87.1] Weakness [R53.1]  DISCHARGE DIAGNOSIS:  Principal Problem:   Generalized weakness Active Problems:   Alcohol abuse   Hyponatremia   HTN (hypertension)   GERD (gastroesophageal reflux disease)   SECONDARY DIAGNOSIS:   Past Medical History  Diagnosis Date  . Hypertension   . Asthma   . COPD (chronic obstructive pulmonary disease) [facility])     HOSPITAL COURSE:   60 year old male with past medical history significant for hypertension and COPD presented to the emergency room secondary to weakness.  #1 generalized weakness-secondary to dehydration and poor by mouth intake. -Electrolytes were low and replaced. -Improved now, physical therapy recommended home health  #2 hyponatremia and hypokalemia- replaced appropriately.   #3 Depression-no suicidal or homicidal ideation. Improved -Started on Remeron. Outpatient psychiatric follow-up - Remeron dose decreased due to increased sleeping with 15mg   #4 alcohol abuse-no recent drinking. No withdrawals  #5 hypertension-blood pressure is borderline. Held lisinopril and metoprolol at discharge  #6 GERD-continue Protonix   Being discharged home today  DISCHARGE CONDITIONS:   Stable  CONSULTS OBTAINED:   None  DRUG ALLERGIES:   Allergies  Allergen Reactions  . Codeine     DISCHARGE MEDICATIONS:   Current Discharge Medication List    START taking these medications   Details  mirtazapine (REMERON) 7.5 MG tablet Take 1 tablet (7.5 mg total) by mouth at bedtime. Qty: 30 tablet, Refills: 2      CONTINUE these medications which have CHANGED   Details  pantoprazole (PROTONIX) 40 MG tablet  Take 1 tablet (40 mg total) by mouth 2 (two) times daily. Qty: 60 tablet, Refills: 2      CONTINUE these medications which have NOT CHANGED   Details  albuterol (PROVENTIL HFA;VENTOLIN HFA) 108 (90 Base) MCG/ACT inhaler Inhale 2 puffs into the lungs 2 (two) times daily.    folic acid (FOLVITE) 1 MG tablet Take 1 mg by mouth 2 (two) times daily.      STOP taking these medications     lisinopril (PRINIVIL,ZESTRIL) 20 MG tablet      metoprolol tartrate (LOPRESSOR) 25 MG tablet          DISCHARGE INSTRUCTIONS:   1. PCP f/u in 1-2 weeks 2. Outpatient psych follow up  If you experience worsening of your admission symptoms, develop shortness of breath, life threatening emergency, suicidal or homicidal thoughts you must seek medical attention immediately by calling 911 or calling your MD immediately  if symptoms less severe.  You Must read complete instructions/literature along with all the possible adverse reactions/side effects for all the Medicines you take and that have been prescribed to you. Take any new Medicines after you have completely understood and accept all the possible adverse reactions/side effects.   Please note  You were cared for by a hospitalist during your hospital stay. If you have any questions about your discharge medications or the care you received while you were in the hospital after you are discharged, you can call the unit and asked to speak with the hospitalist on call if the hospitalist that took care of you is not available. Once you are discharged, your primary care physician will handle any  further medical issues. Please note that NO REFILLS for any discharge medications will be authorized once you are discharged, as it is imperative that you return to your primary care physician (or establish a relationship with a primary care physician if you do not have one) for your aftercare needs so that they can reassess your need for medications and monitor your lab  values.    Today   CHIEF COMPLAINT:   Chief Complaint  Patient presents with  . Weakness  . Depression    VITAL SIGNS:  Blood pressure 134/80, pulse 61, temperature 97.9 F (36.6 C), temperature source Oral, resp. rate 20, height 5\' 5"  (1.651 m), weight 49.578 kg (109 lb 4.8 oz), SpO2 95 %.  I/O:   Intake/Output Summary (Last 24 hours) at 09/04/15 1437 Last data filed at 09/04/15 1300  Gross per 24 hour  Intake   2121 ml  Output   3000 ml  Net   -879 ml    PHYSICAL EXAMINATION:   Physical Exam  GENERAL: 60 y.o.-year-old thin patient , appears malnourished, lying in the bed with no acute distress. Disheleved EYES: Pupils equal, round, reactive to light and accommodation. No scleral icterus. Extraocular muscles intact.  HEENT: Head atraumatic, normocephalic. Oropharynx and nasopharynx clear.  NECK: Supple, no jugular venous distention. No thyroid enlargement, no tenderness.  LUNGS: Normal breath sounds bilaterally, no wheezing, rales,rhonchi or crepitation. No use of accessory muscles of respiration.  CARDIOVASCULAR: S1, S2 normal. No murmurs, rubs, or gallops.  ABDOMEN: Soft, nontender, nondistended. Bowel sounds present. No organomegaly or mass.  EXTREMITIES: No pedal edema, cyanosis, or clubbing.  NEUROLOGIC: Cranial nerves II through XII are intact. Muscle strength 5/5 in all extremities. Sensation intact. Gait not checked.  PSYCHIATRIC: The patient is alert and oriented x 3.  SKIN: No obvious rash, lesion, or ulcer.   DATA REVIEW:   CBC  Recent Labs Lab 09/03/15 0554  WBC 4.7  HGB 10.7*  HCT 31.1*  PLT 211    Chemistries   Recent Labs Lab 09/04/15 0436  NA 138  K 4.4  CL 111  CO2 24  GLUCOSE 77  BUN <5*  CREATININE 0.48*  CALCIUM 8.3*    Cardiac Enzymes  Recent Labs Lab 09/02/15 2115  TROPONINI <0.03    Microbiology Results  Results for orders placed or performed during the hospital encounter of 06/21/15  MRSA PCR  Screening     Status: None   Collection Time: 06/21/15 11:28 AM  Result Value Ref Range Status   MRSA by PCR NEGATIVE NEGATIVE Final    Comment:        The GeneXpert MRSA Assay (FDA approved for NASAL specimens only), is one component of a comprehensive MRSA colonization surveillance program. It is not intended to diagnose MRSA infection nor to guide or monitor treatment for MRSA infections.     RADIOLOGY:  Dg Chest 2 View  09/02/2015  CLINICAL DATA:  Weakness. EXAM: CHEST  2 VIEW COMPARISON:  June 21, 2015 FINDINGS: Hyperinflation of lungs consistent with emphysema. The cardiomediastinal silhouette is stable. No pulmonary nodules, masses, or infiltrates. Healed left rib fractures are identified. There is a right proximal humeral fracture. This was first seen in April of 2017. IMPRESSION: Emphysema and hyperinflation. Right proximal humeral fracture first seen in April 2017. No other acute abnormalities. Electronically Signed   By: Gerome Sam III M.D   On: 09/02/2015 21:52    EKG:   Orders placed or performed during the hospital encounter of 09/02/15  .  EKG 12-Lead  . EKG 12-Lead  . ED EKG within 10 minutes  . ED EKG within 10 minutes  . EKG 12-Lead  . EKG 12-Lead      Management plans discussed with the patient, family and they are in agreement.  CODE STATUS:     Code Status Orders        Start     Ordered   09/03/15 0117  Full code   Continuous     09/03/15 0116    Code Status History    Date Active Date Inactive Code Status Order ID Comments User Context   06/21/2015  9:01 AM 06/23/2015  8:05 PM Full Code [id number]  Katha Hamming, MD ED      TOTAL TIME TAKING CARE OF THIS PATIENT: 37 minutes.    Enid Baas M.D on 09/04/2015 at 2:37 PM  Between 7am to 6pm - Pager - [phone]  After 6pm go to [email or web address] - password [email or web address] [facility]  [facility]  Office  [phone]  CC: Primary care physician; No PCP Per  Patient

## 2015-09-04 NOTE — Progress Notes (Signed)
Pt d/c to home today.  IV removed intact.  Rx's given to pt w/all questions and concerns addressed.  D/C paperwork reviewed and education provided with all questions and concerns addressed.  Pt brother at bedside for home transport.  Volunteer services contacted for transportation from room to exit.

## 2015-09-26 ENCOUNTER — Encounter: Payer: Self-pay | Admitting: Emergency Medicine

## 2015-09-26 ENCOUNTER — Emergency Department
Admission: EM | Admit: 2015-09-26 | Discharge: 2015-09-26 | Disposition: A | Discharge status: Home / Self Care | Payer: Medicaid Other | Attending: Emergency Medicine | Admitting: Emergency Medicine

## 2015-09-26 ENCOUNTER — Emergency Department: Payer: Medicaid Other

## 2015-09-26 DIAGNOSIS — Z8719 Personal history of other diseases of the digestive system: Secondary | ICD-10-CM | POA: Insufficient documentation

## 2015-09-26 DIAGNOSIS — R52 Pain, unspecified: Secondary | ICD-10-CM

## 2015-09-26 DIAGNOSIS — M25552 Pain in left hip: Secondary | ICD-10-CM | POA: Insufficient documentation

## 2015-09-26 DIAGNOSIS — I10 Essential (primary) hypertension: Secondary | ICD-10-CM | POA: Diagnosis not present

## 2015-09-26 DIAGNOSIS — F17220 Nicotine dependence, chewing tobacco, uncomplicated: Secondary | ICD-10-CM | POA: Diagnosis not present

## 2015-09-26 DIAGNOSIS — J449 Chronic obstructive pulmonary disease, unspecified: Secondary | ICD-10-CM | POA: Insufficient documentation

## 2015-09-26 DIAGNOSIS — S42201D Unspecified fracture of upper end of right humerus, subsequent encounter for fracture with routine healing: Secondary | ICD-10-CM | POA: Insufficient documentation

## 2015-09-26 DIAGNOSIS — J45909 Unspecified asthma, uncomplicated: Secondary | ICD-10-CM | POA: Insufficient documentation

## 2015-09-26 DIAGNOSIS — S42301D Unspecified fracture of shaft of humerus, right arm, subsequent encounter for fracture with routine healing: Secondary | ICD-10-CM

## 2015-09-26 DIAGNOSIS — G8929 Other chronic pain: Secondary | ICD-10-CM | POA: Diagnosis not present

## 2015-09-26 DIAGNOSIS — F17210 Nicotine dependence, cigarettes, uncomplicated: Secondary | ICD-10-CM | POA: Diagnosis not present

## 2015-09-26 DIAGNOSIS — X58XXXD Exposure to other specified factors, subsequent encounter: Secondary | ICD-10-CM | POA: Diagnosis not present

## 2015-09-26 DIAGNOSIS — M25511 Pain in right shoulder: Secondary | ICD-10-CM | POA: Diagnosis present

## 2015-09-26 DIAGNOSIS — Z79899 Other long term (current) drug therapy: Secondary | ICD-10-CM | POA: Insufficient documentation

## 2015-09-26 MED ORDER — TRAMADOL HCL 50 MG PO TABS: 50.0000 mg | ORAL_TABLET | Freq: Four times a day (QID) | ORAL | Status: DC | PRN

## 2015-09-26 MED ORDER — TRAMADOL HCL 50 MG PO TABS
50.0000 mg | ORAL_TABLET | Freq: Once | ORAL | Status: AC
  Administered 2015-09-26: 50 mg via ORAL
  Filled 2015-09-26: qty 1

## 2015-09-26 MED ORDER — MELOXICAM 7.5 MG PO TABS: 7.5000 mg | ORAL_TABLET | Freq: Every day | ORAL | Status: DC

## 2015-09-26 NOTE — ED Notes (Signed)
Patient transported to X-ray 

## 2015-09-26 NOTE — ED Provider Notes (Signed)
[facility] Emergency Department Provider Note ____________________________________________  Time seen: Approximately 3:36 PM  I have reviewed the triage vital signs and the nursing notes.   HISTORY  Chief Complaint Shoulder Pain and Hip Pain    HPI Caleb Carpenter is a 60 y.o. male presents to the emergency department for evaluation of right shoulder pain and left hip pain. He states that the shoulder pain has been present for over a month and the left hip pain has been present for several months but has worsened over the last few weeks. He states that he was evaluated here for this shoulder pain and was given a shoulder immobilizer, which had helped but it got very dirty and he did not want to put it back on. He states that while wearing it, it did help with the shoulder pain. He was supposed to follow-up with orthopedics but did not.   Past Medical History  Diagnosis Date  . Hypertension   . Asthma   . COPD (chronic obstructive pulmonary disease) [facility])     Patient Active Problem List   Diagnosis Date Noted  . General weakness 09/03/2015  . Generalized weakness 09/02/2015  . Hyponatremia 09/02/2015  . HTN (hypertension) 09/02/2015  . GERD (gastroesophageal reflux disease) 09/02/2015  . Acute posthemorrhagic anemia 06/23/2015  . Hypomagnesemia 06/23/2015  . Leukocytosis 06/23/2015  . Alcohol abuse 06/23/2015  . Thrombocytopenia (HCC) 06/23/2015  . Upper GI bleed 06/21/2015  . Reflux esophagitis   . Gastritis   . Duodenal ulceration     Past Surgical History  Procedure Laterality Date  . Esophagogastroduodenoscopy (egd) with propofol N/A 06/21/2015    Procedure: ESOPHAGOGASTRODUODENOSCOPY (EGD) WITH PROPOFOL;  Surgeon: Midge Minium, MD;  Location: [facility] ENDOSCOPY;  Service: Endoscopy;  Laterality: N/A;    Current Outpatient Rx  Name  Route  Sig  Dispense  Refill  . albuterol (PROVENTIL HFA;VENTOLIN HFA) 108 (90 Base) MCG/ACT inhaler   Inhalation  Inhale 2 puffs into the lungs 2 (two) times daily.         . folic acid (FOLVITE) 1 MG tablet   Oral   Take 1 mg by mouth 2 (two) times daily.         . meloxicam (MOBIC) 7.5 MG tablet   Oral   Take 1 tablet (7.5 mg total) by mouth daily.   30 tablet   2   . mirtazapine (REMERON) 7.5 MG tablet   Oral   Take 1 tablet (7.5 mg total) by mouth at bedtime.   30 tablet   2   . pantoprazole (PROTONIX) 40 MG tablet   Oral   Take 1 tablet (40 mg total) by mouth 2 (two) times daily.   60 tablet   2   . traMADol (ULTRAM) 50 MG tablet   Oral   Take 1 tablet (50 mg total) by mouth every 6 (six) hours as needed.   12 tablet   0     Allergies Codeine  Family History  Problem Relation Age of Onset  . Diabetes    . Hypertension      Social History Social History  Substance Use Topics  . Smoking status: Current Some Day Smoker    Types: Cigarettes    Last Attempt to Quit: 06/21/2014  . Smokeless tobacco: Current User    Types: Chew  . Alcohol Use: 1.2 oz/week    2 Cans of beer per week     Comment: per day    Review of Systems  Constitutional: No recent illness. Cardiovascular: Denies chest pain or palpitations. Respiratory: Denies shortness of breath. Musculoskeletal: Pain in Right shoulder and left hip Skin: Negative for rash, wound, lesion. Neurological: Negative for focal weakness or numbness.  ____________________________________________   PHYSICAL EXAM:  VITAL SIGNS: ED Triage Vitals  Enc Vitals Group     BP 09/26/15 1510 103/56 mmHg     Pulse Rate 09/26/15 1510 104     Resp 09/26/15 1510 16     Temp 09/26/15 1510 98.5 F (36.9 C)     Temp Source 09/26/15 1510 Oral     SpO2 09/26/15 1510 94 %     Weight 09/26/15 1510 115 lb (52.164 kg)     Height 09/26/15 1510  (1.651 m)     Head Cir --      Peak Flow --      Pain Score 09/26/15 1510 10     Pain Loc --      Pain Edu? --      Excl. in GC? --     Constitutional: Alert and oriented. Well  appearing and in no acute distress. Eyes: Conjunctivae are normal. EOMI. Head: Atraumatic. Neck: No stridor.  Respiratory: Normal respiratory effort.   Musculoskeletal: Very limited range of motion due to pain of the right shoulder. Full range of motion of the left hip without reproduction of the pain. No increase in pain with internal rotation. Neurologic:  Normal speech and language. No gross focal neurologic deficits are appreciated. Speech is normal. No gait instability. Skin:  Skin is warm, dry and intact. Atraumatic. Psychiatric: Mood and affect are normal. Speech and behavior are normal.  ____________________________________________   LABS (all labs ordered are listed, but only abnormal results are displayed)  Labs Reviewed - No data to display ____________________________________________  RADIOLOGY  Healing fracture deformity in the proximal right humerus without new fracture or subluxation per radiology. Left hip shows no acute bony abnormality or arthropathy. ____________________________________________   PROCEDURES  Procedure(s) performed: None    MDM/Plan:  Shoulder immobilizer applied by ER tech prior to discharge. Patient was encouraged to follow up with the orthopedist. He was advised to call today or in the morning to schedule an appointment. He is to return to the ER for symptoms that change or worsen if unable to schedule an appointment. He was given prescriptions for meloxicam and tramadol. ____________________________________________   FINAL CLINICAL IMPRESSION(S) / ED DIAGNOSES  Final diagnoses:  Pain aggravated by walking  Humerus fracture, right, with routine healing, subsequent encounter  Chronic hip pain, left       Chinita Pester, FNP 09/26/15 2257  Jennye Moccasin, MD 09/26/15 2318

## 2015-09-26 NOTE — ED Notes (Signed)
Pt here via [facility] with c/o chronic right shoulder and left hip pain. Pt seen here about 1 month ago with same pain and sent home with sling but did not follow up.

## 2016-08-07 ENCOUNTER — Emergency Department: Payer: Medicaid Other

## 2016-08-07 ENCOUNTER — Inpatient Hospital Stay
Admission: EM | Admit: 2016-08-07 | Discharge: 2016-08-13 | DRG: 199 | Disposition: A | Discharge status: Home / Self Care | Payer: Medicaid Other | Attending: Internal Medicine | Admitting: Internal Medicine

## 2016-08-07 ENCOUNTER — Encounter: Payer: Self-pay | Admitting: Emergency Medicine

## 2016-08-07 DIAGNOSIS — Z9119 Patient's noncompliance with other medical treatment and regimen: Secondary | ICD-10-CM | POA: Diagnosis not present

## 2016-08-07 DIAGNOSIS — F419 Anxiety disorder, unspecified: Secondary | ICD-10-CM | POA: Diagnosis present

## 2016-08-07 DIAGNOSIS — E222 Syndrome of inappropriate secretion of antidiuretic hormone: Secondary | ICD-10-CM | POA: Diagnosis present

## 2016-08-07 DIAGNOSIS — S2242XA Multiple fractures of ribs, left side, initial encounter for closed fracture: Secondary | ICD-10-CM | POA: Diagnosis present

## 2016-08-07 DIAGNOSIS — S270XXA Traumatic pneumothorax, initial encounter: Secondary | ICD-10-CM

## 2016-08-07 DIAGNOSIS — S270XXD Traumatic pneumothorax, subsequent encounter: Secondary | ICD-10-CM | POA: Diagnosis not present

## 2016-08-07 DIAGNOSIS — J939 Pneumothorax, unspecified: Secondary | ICD-10-CM

## 2016-08-07 DIAGNOSIS — R079 Chest pain, unspecified: Secondary | ICD-10-CM | POA: Diagnosis present

## 2016-08-07 DIAGNOSIS — Z833 Family history of diabetes mellitus: Secondary | ICD-10-CM

## 2016-08-07 DIAGNOSIS — S272XXA Traumatic hemopneumothorax, initial encounter: Secondary | ICD-10-CM | POA: Diagnosis present

## 2016-08-07 DIAGNOSIS — F10231 Alcohol dependence with withdrawal delirium: Secondary | ICD-10-CM | POA: Diagnosis present

## 2016-08-07 DIAGNOSIS — F17220 Nicotine dependence, chewing tobacco, uncomplicated: Secondary | ICD-10-CM | POA: Diagnosis present

## 2016-08-07 DIAGNOSIS — Z8249 Family history of ischemic heart disease and other diseases of the circulatory system: Secondary | ICD-10-CM

## 2016-08-07 DIAGNOSIS — Y93E1 Activity, personal bathing and showering: Secondary | ICD-10-CM

## 2016-08-07 DIAGNOSIS — J9383 Other pneumothorax: Secondary | ICD-10-CM

## 2016-08-07 DIAGNOSIS — I10 Essential (primary) hypertension: Secondary | ICD-10-CM | POA: Diagnosis present

## 2016-08-07 DIAGNOSIS — W182XXA Fall in (into) shower or empty bathtub, initial encounter: Secondary | ICD-10-CM | POA: Diagnosis present

## 2016-08-07 DIAGNOSIS — F17210 Nicotine dependence, cigarettes, uncomplicated: Secondary | ICD-10-CM | POA: Diagnosis present

## 2016-08-07 DIAGNOSIS — J449 Chronic obstructive pulmonary disease, unspecified: Secondary | ICD-10-CM | POA: Diagnosis not present

## 2016-08-07 DIAGNOSIS — E43 Unspecified severe protein-calorie malnutrition: Secondary | ICD-10-CM | POA: Diagnosis present

## 2016-08-07 DIAGNOSIS — E871 Hypo-osmolality and hyponatremia: Secondary | ICD-10-CM

## 2016-08-07 DIAGNOSIS — Z09 Encounter for follow-up examination after completed treatment for conditions other than malignant neoplasm: Secondary | ICD-10-CM

## 2016-08-07 DIAGNOSIS — S20229A Contusion of unspecified back wall of thorax, initial encounter: Secondary | ICD-10-CM | POA: Diagnosis present

## 2016-08-07 DIAGNOSIS — Z681 Body mass index (BMI) 19 or less, adult: Secondary | ICD-10-CM

## 2016-08-07 DIAGNOSIS — K219 Gastro-esophageal reflux disease without esophagitis: Secondary | ICD-10-CM | POA: Diagnosis present

## 2016-08-07 DIAGNOSIS — Y92002 Bathroom of unspecified non-institutional (private) residence single-family (private) house as the place of occurrence of the external cause: Secondary | ICD-10-CM

## 2016-08-07 DIAGNOSIS — F329 Major depressive disorder, single episode, unspecified: Secondary | ICD-10-CM | POA: Diagnosis present

## 2016-08-07 DIAGNOSIS — R410 Disorientation, unspecified: Secondary | ICD-10-CM | POA: Diagnosis not present

## 2016-08-07 DIAGNOSIS — R55 Syncope and collapse: Secondary | ICD-10-CM

## 2016-08-07 DIAGNOSIS — J942 Hemothorax: Secondary | ICD-10-CM

## 2016-08-07 LAB — CBC
HCT: 37 % — ABNORMAL LOW (ref 40.0–52.0)
Hemoglobin: 12.9 g/dL — ABNORMAL LOW (ref 13.0–18.0)
MCH: 34.1 pg — ABNORMAL HIGH (ref 26.0–34.0)
MCHC: 34.7 g/dL (ref 32.0–36.0)
MCV: 98.2 fL (ref 80.0–100.0)
PLATELETS: 172 10*3/uL (ref 150–440)
RBC: 3.77 MIL/uL — ABNORMAL LOW (ref 4.40–5.90)
RDW: 15.1 % — ABNORMAL HIGH (ref 11.5–14.5)
WBC: 8.7 10*3/uL (ref 3.8–10.6)

## 2016-08-07 LAB — BASIC METABOLIC PANEL
Anion gap: 11 (ref 5–15)
BUN: 5 mg/dL — AB (ref 6–20)
CALCIUM: 8.9 mg/dL (ref 8.9–10.3)
CHLORIDE: 88 mmol/L — AB (ref 101–111)
CO2: 27 mmol/L (ref 22–32)
CREATININE: 0.65 mg/dL (ref 0.61–1.24)
GFR calc Af Amer: >60 mL/min (ref >60)
GFR calc non Af Amer: >60 mL/min (ref >60)
GLUCOSE: 113 mg/dL — AB (ref 65–99)
Potassium: 3.9 mmol/L (ref 3.5–5.1)
Sodium: 126 mmol/L — ABNORMAL LOW (ref 135–145)

## 2016-08-07 LAB — TROPONIN I

## 2016-08-07 LAB — APTT: APTT: 32 s (ref 24–36)

## 2016-08-07 LAB — PROTIME-INR
INR: 1.09
Prothrombin Time: 14.1 seconds (ref 11.4–15.2)

## 2016-08-07 MED ORDER — HYDROCODONE-ACETAMINOPHEN 5-325 MG PO TABS
1.0000 | ORAL_TABLET | ORAL | Status: DC | PRN
Start: 2016-08-07 — End: 2016-08-13
  Administered 2016-08-12: 2 via ORAL
  Filled 2016-08-07 (×2): qty 2

## 2016-08-07 MED ORDER — ACETAMINOPHEN 325 MG PO TABS
650.0000 mg | ORAL_TABLET | Freq: Four times a day (QID) | ORAL | Status: DC | PRN
  Administered 2016-08-08: 650 mg via ORAL
  Filled 2016-08-07: qty 2

## 2016-08-07 MED ORDER — FLUTICASONE PROPIONATE 50 MCG/ACT NA SUSP
2.0000 | Freq: Every day | NASAL | Status: DC | PRN
  Filled 2016-08-07: qty 16

## 2016-08-07 MED ORDER — LORAZEPAM 1 MG PO TABS: 0.0000 mg | ORAL_TABLET | Freq: Four times a day (QID) | ORAL | Status: AC

## 2016-08-07 MED ORDER — ALBUTEROL SULFATE (2.5 MG/3ML) 0.083% IN NEBU
2.5000 mg | INHALATION_SOLUTION | Freq: Two times a day (BID) | RESPIRATORY_TRACT | Status: DC | PRN
  Administered 2016-08-09: 2.5 mg via RESPIRATORY_TRACT
  Filled 2016-08-07: qty 3

## 2016-08-07 MED ORDER — ACETAMINOPHEN 650 MG RE SUPP: 650.0000 mg | Freq: Four times a day (QID) | RECTAL | Status: DC | PRN

## 2016-08-07 MED ORDER — FOLIC ACID 0.5 MG HALF TAB
500.0000 ug | ORAL_TABLET | Freq: Every day | ORAL | Status: DC
  Filled 2016-08-07: qty 1

## 2016-08-07 MED ORDER — ONDANSETRON HCL 4 MG PO TABS: 4.0000 mg | ORAL_TABLET | Freq: Four times a day (QID) | ORAL | Status: DC | PRN

## 2016-08-07 MED ORDER — PANTOPRAZOLE SODIUM 40 MG PO TBEC
40.0000 mg | DELAYED_RELEASE_TABLET | Freq: Two times a day (BID) | ORAL | Status: DC
  Administered 2016-08-07 – 2016-08-13 (×11): 40 mg via ORAL
  Filled 2016-08-07 (×12): qty 1

## 2016-08-07 MED ORDER — MIRTAZAPINE 15 MG PO TABS
7.5000 mg | ORAL_TABLET | Freq: Every day | ORAL | Status: DC
  Administered 2016-08-07 – 2016-08-12 (×6): 7.5 mg via ORAL
  Filled 2016-08-07 (×6): qty 1

## 2016-08-07 MED ORDER — IOPAMIDOL (ISOVUE-370) INJECTION 76%: 80.0000 mL | Freq: Once | INTRAVENOUS | Status: DC | PRN

## 2016-08-07 MED ORDER — VITAMIN D 1000 UNITS PO TABS
5000.0000 [IU] | ORAL_TABLET | Freq: Every day | ORAL | Status: DC
  Administered 2016-08-07 – 2016-08-13 (×7): 5000 [IU] via ORAL
  Filled 2016-08-07 (×7): qty 5

## 2016-08-07 MED ORDER — BISACODYL 5 MG PO TBEC: 5.0000 mg | DELAYED_RELEASE_TABLET | Freq: Every day | ORAL | Status: DC | PRN

## 2016-08-07 MED ORDER — SENNOSIDES-DOCUSATE SODIUM 8.6-50 MG PO TABS: 1.0000 | ORAL_TABLET | Freq: Every evening | ORAL | Status: DC | PRN

## 2016-08-07 MED ORDER — ENOXAPARIN SODIUM 40 MG/0.4ML ~~LOC~~ SOLN
40.0000 mg | SUBCUTANEOUS | Status: DC
  Administered 2016-08-07 – 2016-08-12 (×6): 40 mg via SUBCUTANEOUS
  Filled 2016-08-07 (×6): qty 0.4

## 2016-08-07 MED ORDER — IOPAMIDOL (ISOVUE-300) INJECTION 61%
60.0000 mL | Freq: Once | INTRAVENOUS | Status: AC | PRN
  Administered 2016-08-07: 60 mL via INTRAVENOUS

## 2016-08-07 MED ORDER — THIAMINE HCL 100 MG/ML IJ SOLN: 100.0000 mg | Freq: Every day | INTRAMUSCULAR | Status: DC

## 2016-08-07 MED ORDER — ADULT MULTIVITAMIN W/MINERALS CH
1.0000 | ORAL_TABLET | Freq: Every day | ORAL | Status: DC
  Administered 2016-08-07 – 2016-08-13 (×7): 1 via ORAL
  Filled 2016-08-07 (×7): qty 1

## 2016-08-07 MED ORDER — LORAZEPAM 2 MG/ML IJ SOLN
1.0000 mg | Freq: Four times a day (QID) | INTRAMUSCULAR | Status: AC | PRN
  Administered 2016-08-10 (×2): 1 mg via INTRAVENOUS
  Filled 2016-08-07 (×2): qty 1

## 2016-08-07 MED ORDER — FOLIC ACID 1 MG PO TABS
1.0000 mg | ORAL_TABLET | Freq: Every day | ORAL | Status: DC
  Administered 2016-08-07 – 2016-08-13 (×7): 1 mg via ORAL
  Filled 2016-08-07 (×7): qty 1

## 2016-08-07 MED ORDER — POTASSIUM CHLORIDE IN NACL 40-0.9 MEQ/L-% IV SOLN
INTRAVENOUS | Status: DC
  Administered 2016-08-07: 75 mL/h via INTRAVENOUS
  Filled 2016-08-07 (×2): qty 1000

## 2016-08-07 MED ORDER — MORPHINE SULFATE (PF) 2 MG/ML IV SOLN
2.0000 mg | Freq: Once | INTRAVENOUS | Status: AC
  Administered 2016-08-07: 2 mg via INTRAVENOUS
  Filled 2016-08-07: qty 1

## 2016-08-07 MED ORDER — LORAZEPAM 1 MG PO TABS
1.0000 mg | ORAL_TABLET | Freq: Four times a day (QID) | ORAL | Status: AC | PRN
  Administered 2016-08-10: 1 mg via ORAL
  Filled 2016-08-07: qty 1

## 2016-08-07 MED ORDER — HYDRALAZINE HCL 20 MG/ML IJ SOLN: 10.0000 mg | Freq: Four times a day (QID) | INTRAMUSCULAR | Status: DC | PRN

## 2016-08-07 MED ORDER — METOPROLOL TARTRATE 25 MG PO TABS
25.0000 mg | ORAL_TABLET | Freq: Two times a day (BID) | ORAL | Status: DC
  Administered 2016-08-07 – 2016-08-13 (×10): 25 mg via ORAL
  Filled 2016-08-07 (×11): qty 1

## 2016-08-07 MED ORDER — DONEPEZIL HCL 5 MG PO TABS
5.0000 mg | ORAL_TABLET | Freq: Every day | ORAL | Status: DC
  Administered 2016-08-07 – 2016-08-12 (×6): 5 mg via ORAL
  Filled 2016-08-07 (×6): qty 1

## 2016-08-07 MED ORDER — ONDANSETRON HCL 4 MG/2ML IJ SOLN: 4.0000 mg | Freq: Four times a day (QID) | INTRAMUSCULAR | Status: DC | PRN

## 2016-08-07 MED ORDER — LISINOPRIL 20 MG PO TABS
20.0000 mg | ORAL_TABLET | Freq: Every day | ORAL | Status: DC
  Administered 2016-08-07 – 2016-08-13 (×7): 20 mg via ORAL
  Filled 2016-08-07 (×6): qty 1

## 2016-08-07 MED ORDER — VITAMIN B-1 100 MG PO TABS
100.0000 mg | ORAL_TABLET | Freq: Every day | ORAL | Status: DC
  Administered 2016-08-07 – 2016-08-13 (×7): 100 mg via ORAL
  Filled 2016-08-07 (×7): qty 1

## 2016-08-07 MED ORDER — BUPROPION HCL ER (XL) 150 MG PO TB24
150.0000 mg | ORAL_TABLET | Freq: Every day | ORAL | Status: DC
  Administered 2016-08-07 – 2016-08-13 (×7): 150 mg via ORAL
  Filled 2016-08-07 (×7): qty 1

## 2016-08-07 MED ORDER — LORAZEPAM 1 MG PO TABS
0.0000 mg | ORAL_TABLET | Freq: Two times a day (BID) | ORAL | Status: AC
  Administered 2016-08-10: 2 mg via ORAL
  Filled 2016-08-07: qty 2

## 2016-08-07 NOTE — Consult Note (Signed)
Patient ID: Caleb Carpenter, male   DOB: Jan 05, 1956, 61 y.o.   MRN: [id number]  HPI Caleb Carpenter is a 61 y.o. male asked to see in consultation. Caleb Carpenter post a fall last night apparently was in the shower and he had a call intoxication does not remember and he failed to the ground. Now is complaining of left-sided chest pain. The pain is intermittent and moderate in intensity and is sharp in nature worsening when taking a deep breath. Patient is known COPD or and is noncompliant. He was supposed to be on oxygen and inhalers but he is noncompliant. He also smokes and chews tobacco daily. Has a history of COPD. CT scan of the chest personal review there is evidence about 25% pneumothorax with bilateral emphysematous changes. There is a thoracic atherosclerotic plaque but no evidence of dissection. 3 rib fractures and left side. A CT scan of the brain no evidence of any hemorrhage or acute traumatic injuries. Hb stable, creat ok, low NA.  HPI  Past Medical History:  Diagnosis Date  . Asthma   . COPD (chronic obstructive pulmonary disease) ([place])   . Hypertension     Past Surgical History:  Procedure Laterality Date  . ESOPHAGOGASTRODUODENOSCOPY (EGD) WITH PROPOFOL N/A 06/21/2015   Procedure: ESOPHAGOGASTRODUODENOSCOPY (EGD) WITH PROPOFOL;  Surgeon: Caleb Lame, MD;  Location: [facility] ENDOSCOPY;  Service: Endoscopy;  Laterality: N/A;    Family History  Problem Relation Age of Onset  . Diabetes    . Hypertension      Social History Social History  Substance Use Topics  . Smoking status: Current Some Day Smoker    Types: Cigarettes    Last attempt to quit: 06/21/2014  . Smokeless tobacco: Current User    Types: Chew  . Alcohol use 1.2 oz/week    2 Cans of beer per week     Comment: per day    Allergies  Allergen Reactions  . Codeine     Current Facility-Administered Medications  Medication Dose Route Frequency Provider Last Rate Last Dose  . iopamidol (ISOVUE-370) 76 % injection 80 mL  80  mL Intravenous Once PRN Caleb Polio, MD       Current Outpatient Prescriptions  Medication Sig Dispense Refill  . albuterol (PROVENTIL HFA;VENTOLIN HFA) 108 (90 Base) MCG/ACT inhaler Inhale 2 puffs into the lungs 2 (two) times daily.    Marland Kitchen alendronate (FOSAMAX) 70 MG tablet Take 70 mg by mouth once a week. Take with a full glass of water on an empty stomach.    Marland Kitchen buPROPion (WELLBUTRIN XL) 150 MG 24 hr tablet Take 150 mg by mouth daily.    . Cholecalciferol (D 5000 PO) Take 5,000 Units by mouth daily.    Marland Kitchen donepezil (ARICEPT) 5 MG tablet Take 5 mg by mouth at bedtime.    . fluticasone (FLONASE) 50 MCG/ACT nasal spray Place 2 sprays into both nostrils daily.    . folic acid (FOLVITE) 1 MG tablet Take 1 mg by mouth 2 (two) times daily.    . folic acid (FOLVITE) 956 MCG tablet Take 400 mcg by mouth daily.    Marland Kitchen lisinopril (PRINIVIL,ZESTRIL) 20 MG tablet Take 20 mg by mouth daily.    . meloxicam (MOBIC) 7.5 MG tablet Take 1 tablet (7.5 mg total) by mouth daily. 30 tablet 2  . metoprolol tartrate (LOPRESSOR) 25 MG tablet Take 25 mg by mouth 2 (two) times daily.    . mirtazapine (REMERON) 7.5 MG tablet Take 1 tablet (7.5 mg  total) by mouth at bedtime. 30 tablet 2  . pantoprazole (PROTONIX) 40 MG tablet Take 1 tablet (40 mg total) by mouth 2 (two) times daily. 60 tablet 2  . thiamine (VITAMIN B-1) 100 MG tablet Take 100 mg by mouth daily.    . traMADol (ULTRAM) 50 MG tablet Take 1 tablet (50 mg total) by mouth every 6 (six) hours as needed. (Patient not taking: Reported on 08/07/2016) 12 tablet 0     Review of Systems Full ROS  was asked and was negative except for the information on the HPI  Physical Exam Blood pressure (!) 150/109, pulse (!) 103, temperature 98.2 F (36.8 C), temperature source Oral, resp. rate (!) 21, height 5' 5" (1.651 m), weight 43.1 kg (95 lb), SpO2 94 %. CONSTITUTIONAL: malnourished male in NAD. EYES: Pupils are equal, round, and reactive to light, Sclera are  non-icteric. EARS, NOSE, MOUTH AND THROAT: The oropharynx is clear. The oral mucosa is pink and moist. Hearing is intact to voice. LYMPH NODES:  Lymph nodes in the neck are normal. RESPIRATORY:  Lungs decrease BS left side, no evidence of tension PTX. Left sided chest wall tendernessr. There is normal respiratory effort,and without pathologic use of accessory muscles. CARDIOVASCULAR: Heart is regular without murmurs, gallops, or rubs. GI: The abdomen is soft, nontender, and nondistended. There are no palpable masses. There is no hepatosplenomegaly. There are normal bowel sounds in all quadrants. GU: Rectal deferred.   MUSCULOSKELETAL: Normal muscle strength and tone. No cyanosis or edema.   SKIN: Turgor is good and there are no pathologic skin lesions or ulcers. NEUROLOGIC: Motor and sensation is grossly normal. Cranial nerves are grossly intact. PSYCH:  Oriented to person, place and time. Affect is normal.  Data Reviewed I have personally reviewed the patient's imaging, laboratory findings and medical records.    Assessment/   Plan   60-year-old male with, caution, left pneumothorax and multiple 3 rib fractures. He did have a syncopal episode and has multiple comorbidities including alcohol dependence and malnutrition. His sodium is very low as well. I have discussed with patient in detail with the brother and give my thoughts about the need for urgent chest tube. He understands and agrees consent was obtained. Also discussed with hospitalist service and Caleb Carpenter from the ER and he will be admitted to Hospital service and we'll follow him. No need for any major surgical intervention or other than the chest tube.  Procedure note 1. Placement of left chest tube 14 FR size 2. Intercostal nerve blocks 4,5,6th bundles on the left side  Anesthesia: 20 cc lidocaine 1% w epi  Complications: none  EBL: minimal  After informed consent was obtained at the left chest was prepped and draped in  the sterile fashion and lidocaine 1% was used to perform intercostal nerve blocks in the standard fashion. Using the pneumothorax kit we proceeded to insert a needle guide and with a modified Seldinger technique we were able to place a 14 French chest tube over a guidewire after performing dilation of the chest wall. There was a gush of air. The chest was secured to the chest wall and pleura was connected. There was a small air leak with expiration CXR to follow   Diego Pabon, MD FACS General Surgeon 08/07/2016, 7:13 PM   

## 2016-08-07 NOTE — ED Provider Notes (Addendum)
[facility] Emergency Department Provider Note   ____________________________________________   First MD Initiated Contact with Patient 08/07/16 1603     (approximate)  I have reviewed the triage vital signs and the nursing notes.   HISTORY  Chief Complaint Chest Pain    HPI Caleb Carpenter is a 61 y.o. male who reports he was taking a shower yesterday when he passed out and fell as he was getting out of the shower. He woke up on the floor. He has bruises on his back from this. He says now the left side of his chest hurts when he breathes. It really doesn't hurt as much if you palpated. He has not had chest pain like this before. The pain is moderate in nature sharp and again made worse with movement or deep breathing. He has not walked around a lot today's been laying in the bed resting.   Past Medical History:  Diagnosis Date  . Asthma   . COPD (chronic obstructive pulmonary disease) (HCC)   . Hypertension     Patient Active Problem List   Diagnosis Date Noted  . Pneumothorax 08/07/2016  . General weakness 09/03/2015  . Generalized weakness 09/02/2015  . Hyponatremia 09/02/2015  . HTN (hypertension) 09/02/2015  . GERD (gastroesophageal reflux disease) 09/02/2015  . Acute posthemorrhagic anemia 06/23/2015  . Hypomagnesemia 06/23/2015  . Leukocytosis 06/23/2015  . Alcohol abuse 06/23/2015  . Thrombocytopenia (HCC) 06/23/2015  . Upper GI bleed 06/21/2015  . Reflux esophagitis   . Gastritis   . Duodenal ulceration     Past Surgical History:  Procedure Laterality Date  . ESOPHAGOGASTRODUODENOSCOPY (EGD) WITH PROPOFOL N/A 06/21/2015   Procedure: ESOPHAGOGASTRODUODENOSCOPY (EGD) WITH PROPOFOL;  Surgeon: Midge Minium, MD;  Location: [facility] ENDOSCOPY;  Service: Endoscopy;  Laterality: N/A;    Prior to Admission medications   Medication Sig Start Date End Date Taking? Authorizing Provider  albuterol (PROVENTIL HFA;VENTOLIN HFA) 108 (90 Base) MCG/ACT  inhaler Inhale 2 puffs into the lungs 2 (two) times daily.   Yes Historical Provider, MD  alendronate (FOSAMAX) 70 MG tablet Take 70 mg by mouth once a week. Take with a full glass of water on an empty stomach.   Yes Historical Provider, MD  buPROPion (WELLBUTRIN XL) 150 MG 24 hr tablet Take 150 mg by mouth daily.   Yes Historical Provider, MD  Cholecalciferol (D 5000 PO) Take 5,000 Units by mouth daily.   Yes Historical Provider, MD  donepezil (ARICEPT) 5 MG tablet Take 5 mg by mouth at bedtime.   Yes Historical Provider, MD  fluticasone (FLONASE) 50 MCG/ACT nasal spray Place 2 sprays into both nostrils daily.   Yes Historical Provider, MD  folic acid (FOLVITE) 1 MG tablet Take 1 mg by mouth 2 (two) times daily.   Yes Historical Provider, MD  folic acid (FOLVITE) 400 MCG tablet Take 400 mcg by mouth daily.   Yes Historical Provider, MD  lisinopril (PRINIVIL,ZESTRIL) 20 MG tablet Take 20 mg by mouth daily.   Yes Historical Provider, MD  meloxicam (MOBIC) 7.5 MG tablet Take 1 tablet (7.5 mg total) by mouth daily. 09/26/15 09/25/16 Yes Cari B Triplett, FNP  metoprolol tartrate (LOPRESSOR) 25 MG tablet Take 25 mg by mouth 2 (two) times daily.   Yes Historical Provider, MD  mirtazapine (REMERON) 7.5 MG tablet Take 1 tablet (7.5 mg total) by mouth at bedtime. 09/04/15  Yes Enid Baas, MD  pantoprazole (PROTONIX) 40 MG tablet Take 1 tablet (40 mg total) by mouth 2 (  two) times daily. 09/04/15  Yes Enid Baas, MD  thiamine (VITAMIN B-1) 100 MG tablet Take 100 mg by mouth daily.   Yes Historical Provider, MD  traMADol (ULTRAM) 50 MG tablet Take 1 tablet (50 mg total) by mouth every 6 (six) hours as needed. Patient not taking: Reported on 08/07/2016 09/26/15   Chinita Pester, FNP    Allergies Codeine  Family History  Problem Relation Age of Onset  . Diabetes    . Hypertension      Social History Social History  Substance Use Topics  . Smoking status: Current Some Day Smoker    Types:  Cigarettes    Last attempt to quit: 06/21/2014  . Smokeless tobacco: Current User    Types: Chew  . Alcohol use 1.2 oz/week    2 Cans of beer per week     Comment: per day    Review of Systems  Constitutional: No fever/chills Eyes: No visual changes. ENT: No sore throat. Cardiovascular: See history of present illness Respiratory: Denies unusual shortness of breath. Gastrointestinal: No abdominal pain.  No nausea, no vomiting.  No diarrhea.  No constipation. Genitourinary: Negative for dysuria. Musculoskeletal: Negative for back pain. Skin: Negative for rash. Neurological: Negative for headaches, focal weakness or numbness.   ____________________________________________   PHYSICAL EXAM:  VITAL SIGNS: ED Triage Vitals  Enc Vitals Group     BP 08/07/16 1608 (!) 181/107     Pulse Rate 08/07/16 1608 83     Resp 08/07/16 1608 16     Temp 08/07/16 1608 98.2 F (36.8 C)     Temp Source 08/07/16 1608 Oral     SpO2 08/07/16 1608 93 %     Weight 08/07/16 1604 95 lb (43.1 kg)     Height 08/07/16 1604 5\' 5"  (1.651 m)     Head Circumference --      Peak Flow --      Pain Score 08/07/16 1603 5     Pain Loc --      Pain Edu? --      Excl. in GC? --    Constitutional: Alert and oriented. Well appearing and in no acute distress. Eyes: Conjunctivae are normal. PERRL. EOMI. Head: Atraumatic. Nose: No congestion/rhinnorhea. Mouth/Throat: Mucous membranes are moist.  Oropharynx non-erythematous. Neck: No stridor.   Cardiovascular: Normal rate, regular rhythm. Grossly normal heart sounds.  Good peripheral circulation. Respiratory: Normal respiratory effort.  No retractions. Lungs CTAB. Chest: Some tenderness on palpation of the left chest. There is bruising on the back of the left chest under the scapula. Also pain with deep breathing. Gastrointestinal: Soft and nontender. No distention. No abdominal bruits. No CVA tenderness. Musculoskeletal: No lower extremity tenderness nor edema.   No joint effusions. Neurologic:  Normal speech and language. No gross focal neurologic deficits are appreciated.  Skin:  Skin is warm, dry and intact. No rash noted.   ____________________________________________   LABS (all labs ordered are listed, but only abnormal results are displayed)  Labs Reviewed  BASIC METABOLIC PANEL - Abnormal; Notable for the following:       Result Value   Sodium 126 (*)    Chloride 88 (*)    Glucose, Bld 113 (*)    BUN 5 (*)    All other components within normal limits  CBC - Abnormal; Notable for the following:    RBC 3.77 (*)    Hemoglobin 12.9 (*)    HCT 37.0 (*)    MCH 34.1 (*)  RDW 15.1 (*)    All other components within normal limits  TROPONIN I  PROTIME-INR  APTT   ____________________________________________  EKG  EKG read and interpreted by me shows normal sinus rhythm rate of 86 normal axis somewhat decreased R-wave progression depletion decreased amplitude in the precordial leads one PVC ____________________________________________  RADIOLOGY  Study Result   CLINICAL DATA:  Initial evaluation for acute left-sided chest pain.  EXAM: CHEST  2 VIEW  COMPARISON:  Prior radiograph from 09/02/2015.  FINDINGS: Cardiac and mediastinal silhouettes are stable in size and contour, and remain within normal limits. Trace left-to-right mediastinal shift.  Lungs are hyperinflated with severe emphysematous changes. There is an acute left-sided pneumothorax. Overall volume estimated approximate lean 25%. Associated left basilar atelectasis. No focal infiltrates. No pulmonary edema. There is a small layering left pleural effusion.  There are acute fractures of the left posterior sixth and seventh ribs, new from prior. Additional remotely healed old left-sided posterior rib fractures noted.  Compression deformity within the lower thoracic spine is grossly stable from previous. 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 No other definite acute osseous abnormality.  IMPRESSION: 1. Acute left pneumothorax. Estimated volume approximately 25%. Trace left-to-right mediastinal shift. Associated small left pleural effusion (hydropneumothorax). 2. New/acute fractures of the left posterior sixth and seventh ribs. Query recent trauma. 3. Emphysema. 4. 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 Critical Value/emergent results were called by telephone at the time of interpretation on 08/07/2016 at 5:14 pm to Dr. Dorothea Glassman , who verbally acknowledged these results.   Electronically Signed   By: Rise Mu M.D.   On: 08/07/2016 17:20    Study Result   CLINICAL DATA:  Recent syncopal episode with fall, chest pain and known pneumothorax on the left.  EXAM: CT CHEST WITH CONTRAST  TECHNIQUE: Multidetector CT imaging of the chest was performed during intravenous contrast administration.  CONTRAST:  60mL ISOVUE-300 IOPAMIDOL (ISOVUE-300) INJECTION 61%  COMPARISON:  Plain film from earlier in the same day.  FINDINGS: Cardiovascular: Atherosclerotic calcifications of the aorta and its branches are noted. The ascending aorta measures 3.4 cm in greatest dimension. Descending thoracic aorta demonstrates mural thrombus/ soft atherosclerotic plaque The pulmonary artery as visualized is within normal limits. Coronary calcifications are seen. No cardiac enlargement is noted.  Mediastinum/Nodes: Thoracic inlet is within normal limits. No significant hilar or mediastinal adenopathy is noted. Mild hilar calcifications are seen consistent with prior granulomatous disease. The esophagus as visualize is within normal limits.  Lungs/Pleura: There again noted changes consistent with the small left pneumothorax. The largest component of this lies laterally at the base. No chest tube is noted at this time.  Diffuse emphysematous changes are seen. No focal infiltrate or sizable effusion is noted. Minimal bibasilar atelectasis is seen.  Upper Abdomen: Visualized upper abdomen is within normal limits with the exception of the mural thrombus/ soft atherosclerotic plaque in the proximal aorta. Some plaque is also noted in the origin of the superior mesenteric artery.  Musculoskeletal: Degenerative changes of the thoracic spine are noted. Multiple left rib fractures are noted posteriorly involving the seventh eighth and ninth ribs posteriorly. Healed fractures of the left tenth and eleventh ribs are seen.  IMPRESSION: Multiple acute rib fractures on the left with associated pneumothorax. The predominance of the pneumothorax is in the base laterally.  Diffuse soft atherosclerotic plaque versus mural thrombus within the descending thoracic aorta and abdominal aorta. No aneurysmal dilatation is  seen. 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   Electronically Signed   By: Alcide Clever M.D.   On: 08/07/2016 18:10     ____________________________________________   PROCEDURES  Procedure(s) performed:   Procedures  Critical Care performed:   ____________________________________________   INITIAL IMPRESSION / ASSESSMENT AND PLAN / ED COURSE  Pertinent labs & imaging results that were available during my care of the patient were reviewed by me and considered in my medical decision making (see chart for details).   Discussed with surgery he wants a CT chest we will do that and he will put chest tube and we'll admit to medicine for syncope and hyponatremia he will consult for the chest tubes     ____________________________________________   FINAL CLINICAL IMPRESSION(S) / ED DIAGNOSES  Final diagnoses:  Syncope, unspecified syncope type  Hyponatremia  Traumatic pneumothorax, initial encounter  Hemothorax on left      NEW MEDICATIONS  STARTED DURING THIS VISIT:  New Prescriptions   No medications on file     Note:  This document was prepared using Dragon voice recognition software and may include unintentional dictation errors.    Arnaldo Natal, MD 08/07/16 1733    Arnaldo Natal, MD 08/07/16 1910

## 2016-08-07 NOTE — ED Notes (Signed)
Dr. Everlene Farrier at bedside to place chest tube.  Consent reviewed by MD and signed by patient.

## 2016-08-07 NOTE — H&P (Signed)
[facility] [place] at [facility]   PATIENT NAME: Caleb Carpenter    MR#:  [id number]  DATE OF BIRTH:  01-Oct-1955  DATE OF ADMISSION:  08/07/2016  PRIMARY CARE PHYSICIAN: Dr Lacie Scotts   REQUESTING/REFERRING PHYSICIAN: dr Darnelle Catalan  CHIEF COMPLAINT:   Fall and pain left side HISTORY OF PRESENT ILLNESS:  [name]  is a 61 y.o. male with a known history of EtOH and tobacco dependence, hypertension and COPD who presents above complaint. Patient reports that he was getting out of the shower and he fell. He denies loss of consciousness. Brother bedside says that patient has been drinking a lot of alcohol and he was drunk. Patient comes in today due to left-sided chest pain. CT scan shows pneumothorax. Surgical consult was placed and Dr. Nadeen Landau has placed a chest tube.  Hospitalist was asked to admit patient due to hyponatremia. PAST MEDICAL HISTORY:   Past Medical History:  Diagnosis Date  . Asthma   . COPD (chronic obstructive pulmonary disease) (HCC)   . Hypertension     PAST SURGICAL HISTORY:   Past Surgical History:  Procedure Laterality Date  . ESOPHAGOGASTRODUODENOSCOPY (EGD) WITH PROPOFOL N/A 06/21/2015   Procedure: ESOPHAGOGASTRODUODENOSCOPY (EGD) WITH PROPOFOL;  Surgeon: Midge Minium, MD;  Location: [facility] ENDOSCOPY;  Service: Endoscopy;  Laterality: N/A;    SOCIAL HISTORY:   Social History  Substance Use Topics  . Smoking status: Current Some Day Smoker    Types: Cigarettes    Last attempt to quit: 06/21/2014  . Smokeless tobacco: Current User    Types: Chew  . Alcohol use 1.2 oz/week     4 Cans of beer per day     Comment: per day    FAMILY HISTORY:   Family History  Problem Relation Age of Onset  . Diabetes    . Hypertension      DRUG ALLERGIES:   Allergies  Allergen Reactions  . Codeine     REVIEW OF SYSTEMS:   Review of Systems  Constitutional: Negative.  Negative for chills, fever and malaise/fatigue.  HENT: Negative.  Negative for ear  discharge, ear pain, hearing loss, nosebleeds and sore throat.   Eyes: Negative.  Negative for blurred vision and pain.  Respiratory: Negative.  Negative for cough, hemoptysis, shortness of breath and wheezing.   Cardiovascular: Negative.  Negative for chest pain, palpitations and leg swelling.       Left sided rib pain  Gastrointestinal: Negative.  Negative for abdominal pain, blood in stool, diarrhea, nausea and vomiting.  Genitourinary: Negative.  Negative for dysuria.  Musculoskeletal: Positive for falls. Negative for back pain.  Skin: Negative.   Neurological: Negative for dizziness, tremors, speech change, focal weakness, seizures and headaches.  Endo/Heme/Allergies: Negative.  Does not bruise/bleed easily.  Psychiatric/Behavioral: Negative.  Negative for depression, hallucinations and suicidal ideas.    MEDICATIONS AT HOME:   Prior to Admission medications   Medication Sig Start Date End Date Taking? Authorizing Provider  albuterol (PROVENTIL HFA;VENTOLIN HFA) 108 (90 Base) MCG/ACT inhaler Inhale 2 puffs into the lungs 2 (two) times daily.   Yes Historical Provider, MD  alendronate (FOSAMAX) 70 MG tablet Take 70 mg by mouth once a week. Take with a full glass of water on an empty stomach.   Yes Historical Provider, MD  buPROPion (WELLBUTRIN XL) 150 MG 24 hr tablet Take 150 mg by mouth daily.   Yes Historical Provider, MD  Cholecalciferol (D 5000 PO) Take 5,000 Units by mouth daily.   Yes  Historical Provider, MD  donepezil (ARICEPT) 5 MG tablet Take 5 mg by mouth at bedtime.   Yes Historical Provider, MD  fluticasone (FLONASE) 50 MCG/ACT nasal spray Place 2 sprays into both nostrils daily.   Yes Historical Provider, MD  folic acid (FOLVITE) 1 MG tablet Take 1 mg by mouth 2 (two) times daily.   Yes Historical Provider, MD  folic acid (FOLVITE) 400 MCG tablet Take 400 mcg by mouth daily.   Yes Historical Provider, MD  lisinopril (PRINIVIL,ZESTRIL) 20 MG tablet Take 20 mg by mouth daily.    Yes Historical Provider, MD  meloxicam (MOBIC) 7.5 MG tablet Take 1 tablet (7.5 mg total) by mouth daily. 09/26/15 09/25/16 Yes Cari B Triplett, FNP  metoprolol tartrate (LOPRESSOR) 25 MG tablet Take 25 mg by mouth 2 (two) times daily.   Yes Historical Provider, MD  mirtazapine (REMERON) 7.5 MG tablet Take 1 tablet (7.5 mg total) by mouth at bedtime. 09/04/15  Yes Enid Baas, MD  pantoprazole (PROTONIX) 40 MG tablet Take 1 tablet (40 mg total) by mouth 2 (two) times daily. 09/04/15  Yes Enid Baas, MD  thiamine (VITAMIN B-1) 100 MG tablet Take 100 mg by mouth daily.   Yes Historical Provider, MD  traMADol (ULTRAM) 50 MG tablet Take 1 tablet (50 mg total) by mouth every 6 (six) hours as needed. Patient not taking: Reported on 08/07/2016 09/26/15   Chinita Pester, FNP      VITAL SIGNS:  Blood pressure (!) 150/109, pulse (!) 103, temperature 98.2 F (36.8 C), temperature source Oral, resp. rate (!) 21, height 5\' 5"  (1.651 m), weight 43.1 kg (95 lb), SpO2 94 %.  PHYSICAL EXAMINATION:   Physical Exam  Constitutional: He is oriented to person, place, and time and well-developed, well-nourished, and in no distress. No distress.  Thin disheveled  HENT:  Head: Normocephalic.  Eyes: No scleral icterus.  Neck: Normal range of motion. Neck supple. No JVD present. No tracheal deviation present.  Cardiovascular: Normal rate, regular rhythm and normal heart sounds.  Exam reveals no gallop and no friction rub.   No murmur heard. Pulmonary/Chest: Effort normal and breath sounds normal. No respiratory distress. He has no wheezes. He has no rales. He exhibits no tenderness.  Chest tube  Abdominal: Soft. Bowel sounds are normal. He exhibits no distension and no mass. There is no tenderness. There is no rebound and no guarding.  Musculoskeletal: Normal range of motion. He exhibits no edema.  Neurological: He is alert and oriented to person, place, and time.  Skin: Skin is warm. No rash noted. No  erythema.  Psychiatric: Affect and judgment normal.      LABORATORY PANEL:   CBC  Recent Labs Lab 08/07/16 1609  WBC 8.7  HGB 12.9*  HCT 37.0*  PLT 172   ------------------------------------------------------------------------------------------------------------------  Chemistries   Recent Labs Lab 08/07/16 1609  NA 126*  K 3.9  CL 88*  CO2 27  GLUCOSE 113*  BUN 5*  CREATININE 0.65  CALCIUM 8.9   ------------------------------------------------------------------------------------------------------------------  Cardiac Enzymes  Recent Labs Lab 08/07/16 1609  TROPONINI <0.03   ------------------------------------------------------------------------------------------------------------------  RADIOLOGY:  Dg Chest 2 View  Result Date: 08/07/2016 CLINICAL DATA:  Initial evaluation for acute left-sided chest pain. EXAM: CHEST  2 VIEW COMPARISON:  Prior radiograph from 09/02/2015. FINDINGS: Cardiac and mediastinal silhouettes are stable in size and contour, and remain within normal limits. Trace left-to-right mediastinal shift. Lungs are hyperinflated with severe emphysematous changes. There is an acute left-sided pneumothorax. Overall volume  estimated approximate lean 25%. Associated left basilar atelectasis. No focal infiltrates. No pulmonary edema. There is a small layering left pleural effusion. There are acute fractures of the left posterior sixth and seventh ribs, new from prior. Additional remotely healed old left-sided posterior rib fractures noted. Compression deformity within the lower thoracic spine is grossly stable from previous. 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 No other definite acute osseous abnormality. IMPRESSION: 1. Acute left pneumothorax. Estimated volume approximately 25%. Trace left-to-right mediastinal shift. Associated small left pleural effusion (hydropneumothorax). 2. New/acute fractures of the left  posterior sixth and seventh ribs. Query recent trauma. 3. Emphysema. 4. 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 Critical Value/emergent results were called by telephone at the time of interpretation on 08/07/2016 at 5:14 pm to Dr. Dorothea Glassman , who verbally acknowledged these results. Electronically Signed   By: Rise Mu M.D.   On: 08/07/2016 17:20   Ct Head Wo Contrast  Result Date: 08/07/2016 CLINICAL DATA:  Syncope next inguinal 06/26/2005 EXAM: CT HEAD WITHOUT CONTRAST TECHNIQUE: Contiguous axial images were obtained from the base of the skull through the vertex without intravenous contrast. COMPARISON:  06/26/2005 FINDINGS: Brain: Mild diffuse cerebral atrophy. No acute intracranial abnormality. Specifically, no hemorrhage, hydrocephalus, mass lesion, acute infarction, or significant intracranial injury. Vascular: No hyperdense vessel or unexpected calcification. Skull: No acute calvarial abnormality. Sinuses/Orbits: Mucosal thickening in the right sphenoid sinus and right maxillary sinus. No air-fluid levels. Mastoid air cells are clear. Orbital soft tissues unremarkable. Other: None IMPRESSION: No acute intracranial abnormality. Mild right chronic sinusitis. Electronically Signed   By: Charlett Nose M.D.   On: 08/07/2016 17:31   Ct Chest W Contrast  Result Date: 08/07/2016 CLINICAL DATA:  Recent syncopal episode with fall, chest pain and known pneumothorax on the left. EXAM: CT CHEST WITH CONTRAST TECHNIQUE: Multidetector CT imaging of the chest was performed during intravenous contrast administration. CONTRAST:  60mL ISOVUE-300 IOPAMIDOL (ISOVUE-300) INJECTION 61% COMPARISON:  Plain film from earlier in the same day. FINDINGS: Cardiovascular: Atherosclerotic calcifications of the aorta and its branches are noted. The ascending aorta measures 3.4 cm in greatest dimension. Descending thoracic aorta demonstrates mural thrombus/ soft atherosclerotic  plaque The pulmonary artery as visualized is within normal limits. Coronary calcifications are seen. No cardiac enlargement is noted. Mediastinum/Nodes: Thoracic inlet is within normal limits. No significant hilar or mediastinal adenopathy is noted. Mild hilar calcifications are seen consistent with prior granulomatous disease. The esophagus as visualize is within normal limits. Lungs/Pleura: There again noted changes consistent with the small left pneumothorax. The largest component of this lies laterally at the base. No chest tube is noted at this time. Diffuse emphysematous changes are seen. No focal infiltrate or sizable effusion is noted. Minimal bibasilar atelectasis is seen. Upper Abdomen: Visualized upper abdomen is within normal limits with the exception of the mural thrombus/ soft atherosclerotic plaque in the proximal aorta. Some plaque is also noted in the origin of the superior mesenteric artery. Musculoskeletal: Degenerative changes of the thoracic spine are noted. Multiple left rib fractures are noted posteriorly involving the seventh eighth and ninth ribs posteriorly. Healed fractures of the left tenth and eleventh ribs are seen. IMPRESSION: Multiple acute rib fractures on the left with associated pneumothorax. The predominance of the pneumothorax is in the base laterally. Diffuse soft atherosclerotic plaque versus mural thrombus within the descending thoracic aorta and abdominal aorta. No aneurysmal dilatation is seen. 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 Electronically Signed   By: Alcide Clever M.D.   On: 08/07/2016 18:10    EKG:   Sinus rhythm with some PVCs  IMPRESSION AND PLAN:   61 year old male status post accidental fall who has suffered multiple acute rib fractures on the left with associated pneumothorax.  1. Pneumothorax status post chest tube Follow surgical recommendations  2. Acute left-sided rib fractures: Pain control PT consult  3.  Hyponatremia from poor by mouth intake due to EtOH abuse Start IV fluids and repeat BMP in a.m.   4. EtOH dependence: CIWA protocol  5. Essential hypertension: Continue lisinopril and metoprolol  6. GERD: Continue PPI  7. Depression: Continue Remeron and Wellbutrin  8. COPD without exacerbation  9.Tobacco dependence: Patient is encouraged to quit smoking. Counseling was provided for 4 minutes.   All the records are reviewed and case discussed with ED provider. Management plans discussed with the patient and he is in agreement  CODE STATUS: full  TOTAL TIME TAKING CARE OF THIS PATIENT: 45 minutes.    Marycatherine Maniscalco M.D on 08/07/2016 at 7:06 PM  Between 7am to 6pm - Pager - [phone]  After 6pm go to [email or web address] - password [employer]  [facility] [place]  Office  [phone]  CC: Primary care physician; dr Lacie Scotts

## 2016-08-07 NOTE — ED Triage Notes (Signed)
Pt to ED via EMS from home c/o left chest pain since last night.  Pt states fell out of shower last night.  States pain is worse with breathing and palpation.  States has productive cough white sputum.  EMS states wheezing right side and diminished.  Hx COPD, non compliant with medications.  EMS gave 1 duoneb and 125mg  solumedrol en route.  EMS vitals 98.2 temp, 150/80 BP, 102 HR, 95 CBG.

## 2016-08-08 ENCOUNTER — Inpatient Hospital Stay: Payer: Medicaid Other

## 2016-08-08 LAB — BASIC METABOLIC PANEL
Anion gap: 7 (ref 5–15)
BUN: 9 mg/dL (ref 6–20)
CHLORIDE: 93 mmol/L — AB (ref 101–111)
CO2: 27 mmol/L (ref 22–32)
CREATININE: 0.62 mg/dL (ref 0.61–1.24)
Calcium: 8.3 mg/dL — ABNORMAL LOW (ref 8.9–10.3)
GFR calc Af Amer: >60 mL/min (ref >60)
GFR calc non Af Amer: >60 mL/min (ref >60)
Glucose, Bld: 260 mg/dL — ABNORMAL HIGH (ref 65–99)
POTASSIUM: 4.5 mmol/L (ref 3.5–5.1)
SODIUM: 127 mmol/L — AB (ref 135–145)

## 2016-08-08 LAB — CBC
HEMATOCRIT: 31.8 % — AB (ref 40.0–52.0)
Hemoglobin: 11.1 g/dL — ABNORMAL LOW (ref 13.0–18.0)
MCH: 34.2 pg — AB (ref 26.0–34.0)
MCHC: 34.9 g/dL (ref 32.0–36.0)
MCV: 98 fL (ref 80.0–100.0)
PLATELETS: 148 10*3/uL — AB (ref 150–440)
RBC: 3.24 MIL/uL — ABNORMAL LOW (ref 4.40–5.90)
RDW: 15.1 % — ABNORMAL HIGH (ref 11.5–14.5)
WBC: 4.6 10*3/uL (ref 3.8–10.6)

## 2016-08-08 MED ORDER — NICOTINE 14 MG/24HR TD PT24
14.0000 mg | MEDICATED_PATCH | Freq: Every day | TRANSDERMAL | Status: DC
  Administered 2016-08-08 – 2016-08-13 (×6): 14 mg via TRANSDERMAL
  Filled 2016-08-08 (×6): qty 1

## 2016-08-08 MED ORDER — SODIUM CHLORIDE 0.9 % IV SOLN
INTRAVENOUS | Status: DC
  Administered 2016-08-08 (×2): via INTRAVENOUS

## 2016-08-08 MED ORDER — BENZONATATE 100 MG PO CAPS
100.0000 mg | ORAL_CAPSULE | Freq: Three times a day (TID) | ORAL | Status: DC
Start: 2016-08-08 — End: 2016-08-13
  Administered 2016-08-08 – 2016-08-13 (×13): 100 mg via ORAL
  Filled 2016-08-08 (×13): qty 1

## 2016-08-08 MED ORDER — ENSURE ENLIVE PO LIQD
237.0000 mL | Freq: Two times a day (BID) | ORAL | Status: DC
  Administered 2016-08-09 – 2016-08-13 (×7): 237 mL via ORAL

## 2016-08-08 NOTE — Progress Notes (Signed)
Inpatient Diabetes Program Recommendations  AACE/ADA: New Consensus Statement on Inpatient Glycemic Control (2015)  Target Ranges:  Prepandial:   less than 140 mg/dL      Peak postprandial:   less than 180 mg/dL (1-2 hours)      Critically ill patients:  140 - 180 mg/dL   Results for Caleb Carpenter, Caleb Carpenter (MRN [id number]) as of 08/08/2016 13:37  Ref. Range 08/07/2016 16:09 08/08/2016 04:58  Glucose Latest Ref Range: 65 - 99 mg/dL 157 (H) 262 (H)   Review of Glycemic Control  Diabetes history:NO Outpatient Diabetes medications: NA Current orders for Inpatient glycemic control: None  Inpatient Diabetes Program Recommendations: Correction (SSI): Lab glucose 260 mg/dl this morning at 0:35 am. MD may want to consider ordering CBGs with Novolog correction scale if needed. HgbA1C: Please consider ordering an A1C to evaluate glycemic contro over the past 2-3 months.  Thanks, Orlando Penner, RN, MSN, CDE Diabetes Coordinator Inpatient Diabetes Program [phone] (Team Pager from 8am to 5pm)

## 2016-08-08 NOTE — Progress Notes (Signed)
CC: PTC rib fxs Subjective: Doing well No complaints, no sob CXR personally reviewed good lung expansion ct in place no PTC  Objective: Vital signs in last 24 hours: Temp:  [97.8 F (36.6 C)-98.2 F (36.8 C)] 97.8 F (36.6 C) (04/26 0902) Pulse Rate:  [58-104] 60 (04/26 0902) Resp:  [14-21] 20 (04/26 0902) BP: (100-181)/(63-109) 110/65 (04/26 0902) SpO2:  [93 %-100 %] 100 % (04/26 0902) Weight:  [43.1 kg (95 lb)-46.7 kg (102 lb 14.4 oz)] 46.7 kg (102 lb 14.4 oz) (04/25 2150)    Intake/Output from previous day: 04/25 0701 - 04/26 0700 In: 484 [I.V.:484] Out: 175 [Urine:175] Intake/Output this shift: Total I/O In: 360 [P.O.:360] Out: 175 [Urine:175]  Physical exam: NAD, awake and alert Chest: CTA, NSR, CT in place, there is a very small air leak on valsalva, no air leak when tube is place on water seal Abd: soft, NT, no peritonitis Ext: no edema and well perfused  Lab Results: CBC   Recent Labs  08/07/16 1609 08/08/16 0458  WBC 8.7 4.6  HGB 12.9* 11.1*  HCT 37.0* 31.8*  PLT 172 148*   BMET  Recent Labs  08/07/16 1609 08/08/16 0458  NA 126* 127*  K 3.9 4.5  CL 88* 93*  CO2 27 27  GLUCOSE 113* 260*  BUN 5* 9  CREATININE 0.65 0.62  CALCIUM 8.9 8.3*   PT/INR  Recent Labs  08/07/16 1823  LABPROT 14.1  INR 1.09   ABG No results for input(s): PHART, HCO3 in the last 72 hours.  Invalid input(s): PCO2, PO2  Studies/Results: Dg Chest 2 View  Result Date: 08/07/2016 CLINICAL DATA:  Initial evaluation for acute left-sided chest pain. EXAM: CHEST  2 VIEW COMPARISON:  Prior radiograph from 09/02/2015. FINDINGS: Cardiac and mediastinal silhouettes are stable in size and contour, and remain within normal limits. Trace left-to-right mediastinal shift. Lungs are hyperinflated with severe emphysematous changes. There is an acute left-sided pneumothorax. Overall volume estimated approximate lean 25%. Associated left basilar atelectasis. No focal infiltrates. No  pulmonary edema. There is a small layering left pleural effusion. There are acute fractures of the left posterior sixth and seventh ribs, new from prior. Additional remotely healed old left-sided posterior rib fractures noted. Compression deformity within the lower thoracic spine is grossly stable from previous. 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 No other definite acute osseous abnormality. IMPRESSION: 1. Acute left pneumothorax. Estimated volume approximately 25%. Trace left-to-right mediastinal shift. Associated small left pleural effusion (hydropneumothorax). 2. New/acute fractures of the left posterior sixth and seventh ribs. Query recent trauma. 3. Emphysema. 4. 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 Critical Value/emergent results were called by telephone at the time of interpretation on 08/07/2016 at 5:14 pm to Dr. Conni Slipper , who verbally acknowledged these results. Electronically Signed   By: Jeannine Boga M.D.   On: 08/07/2016 17:20   Ct Head Wo Contrast  Result Date: 08/07/2016 CLINICAL DATA:  Syncope next inguinal 06/26/2005 EXAM: CT HEAD WITHOUT CONTRAST TECHNIQUE: Contiguous axial images were obtained from the base of the skull through the vertex without intravenous contrast. COMPARISON:  06/26/2005 FINDINGS: Brain: Mild diffuse cerebral atrophy. No acute intracranial abnormality. Specifically, no hemorrhage, hydrocephalus, mass lesion, acute infarction, or significant intracranial injury. Vascular: No hyperdense vessel or unexpected calcification. Skull: No acute calvarial abnormality. Sinuses/Orbits: Mucosal thickening in the right sphenoid sinus and right maxillary sinus. No air-fluid levels. Mastoid air cells are clear. Orbital soft tissues unremarkable.  Other: None IMPRESSION: No acute intracranial abnormality. Mild right chronic sinusitis. Electronically Signed   By: Rolm Baptise M.D.   On:  08/07/2016 17:31   Ct Chest W Contrast  Result Date: 08/07/2016 CLINICAL DATA:  Recent syncopal episode with fall, chest pain and known pneumothorax on the left. EXAM: CT CHEST WITH CONTRAST TECHNIQUE: Multidetector CT imaging of the chest was performed during intravenous contrast administration. CONTRAST:  22m ISOVUE-300 IOPAMIDOL (ISOVUE-300) INJECTION 61% COMPARISON:  Plain film from earlier in the same day. FINDINGS: Cardiovascular: Atherosclerotic calcifications of the aorta and its branches are noted. The ascending aorta measures 3.4 cm in greatest dimension. Descending thoracic aorta demonstrates mural thrombus/ soft atherosclerotic plaque The pulmonary artery as visualized is within normal limits. Coronary calcifications are seen. No cardiac enlargement is noted. Mediastinum/Nodes: Thoracic inlet is within normal limits. No significant hilar or mediastinal adenopathy is noted. Mild hilar calcifications are seen consistent with prior granulomatous disease. The esophagus as visualize is within normal limits. Lungs/Pleura: There again noted changes consistent with the small left pneumothorax. The largest component of this lies laterally at the base. No chest tube is noted at this time. Diffuse emphysematous changes are seen. No focal infiltrate or sizable effusion is noted. Minimal bibasilar atelectasis is seen. Upper Abdomen: Visualized upper abdomen is within normal limits with the exception of the mural thrombus/ soft atherosclerotic plaque in the proximal aorta. Some plaque is also noted in the origin of the superior mesenteric artery. Musculoskeletal: Degenerative changes of the thoracic spine are noted. Multiple left rib fractures are noted posteriorly involving the seventh eighth and ninth ribs posteriorly. Healed fractures of the left tenth and eleventh ribs are seen. IMPRESSION: Multiple acute rib fractures on the left with associated pneumothorax. The predominance of the pneumothorax is in the  base laterally. Diffuse soft atherosclerotic plaque versus mural thrombus within the descending thoracic aorta and abdominal aorta. No aneurysmal dilatation is seen. 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 Electronically Signed   By: MInez CatalinaM.D.   On: 08/07/2016 18:10   Dg Chest Port 1 View  Result Date: 08/08/2016 CLINICAL DATA:  61year old male with pneumothorax. EXAM: PORTABLE CHEST 1 VIEW COMPARISON:  Chest radiograph dated 08/07/2016 FINDINGS: A left-sided chest tube remains in stable position. There is emphysematous changes of the lungs. Left lung base densities likely represent atelectatic changes. There is no focal consolidation. No significant pleural effusion. No pneumothorax identified. The cardiac silhouette is within normal limits. There is atherosclerotic calcification of the thoracic aorta. There is osteopenia. Multiple old healed left posterior rib fracture. Acute or subacute appearing fractures of the left posterior seventh and eighth ribs noted. There is an old fracture of the right humeral neck with nonunion. IMPRESSION: 1. Stable positioning of the left-sided chest tube. No residual pneumothorax identified. 2. Multiple left posterior rib fractures. 3. 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 Electronically Signed   By: AAnner CreteM.D.   On: 08/08/2016 06:33   Dg Chest Portable 1 View  Result Date: 08/07/2016 CLINICAL DATA:  Chest tube placement. EXAM: PORTABLE CHEST 1 VIEW COMPARISON:  CT chest from the same day. FINDINGS: A left-sided chest tube is now place. No significant residual pneumothorax is present. Multiple left-sided rib fractures are again seen. The nonunion right humerus fracture is again seen. Centrilobular emphysema is noted. IMPRESSION: 1. Interval placement of left-sided chest tube without residual pneumothorax. 2. Multiple left-sided rib fractures. 3. Nonunion right humerus fracture. Electronically Signed   By:  San Morelle M.D.   On: 08/07/2016 19:28    Anti-infectives: Anti-infectives    None      Assessment/Plan: Left PTX Small persistent air leak, no recurrent PTX on CXR continue 20 cms suction I/S No surgical intervention  Caroleen Hamman, MD, FACS  08/08/2016

## 2016-08-08 NOTE — Progress Notes (Signed)
Initial Nutrition Assessment  DOCUMENTATION CODES:   Severe malnutrition in context of chronic illness, Underweight  INTERVENTION:  Provide Ensure Enlive po BID, each supplement provides 350 kcal and 20 grams of protein.   Continue MVI, folate 1 mg, thiamine 100 mg daily.   Monitor magnesium, potassium, and phosphorus daily for at least 3 days, MD to replete as needed, as pt is at risk for refeeding syndrome given severe malnutrition, history of EtOH abuse.  Reviewed "High-Calorie, High-Protein Nutrition Therapy" handout from the Academy of Nutrition and Dietetics. Encouraged intake of calorie- and protein-dense snacks throughout the day at home in addition to meal prepared by brother-in-law. Discussed goal of weight gain as patient is underweight.   NUTRITION DIAGNOSIS:   Malnutrition (Severe) related to chronic illness (COPD, EtOH abuse) as evidenced by severe depletion of body fat, severe depletion of muscle mass.  GOAL:   Patient will meet greater than or equal to 90% of their needs, Weight gain  MONITOR:   PO intake, Supplement acceptance, Labs, Weight trends, I & O's  REASON FOR ASSESSMENT:   Other (Comment) (Low BMI)    ASSESSMENT:   61 year old male with PMHx of HTN, asthma, COPD, alcohol abuse, tobacco use disorder who presented after a fall and left-sided chest pain noted to have pneumothorax now s/p chest tube placement.   Spoke with patient at bedside. He reports his appetite is good here and he is eating well. At home it is "so-so." He reports the only meal he eats every day is prepared by his brother-in-law (usually chicken or another meat with sides). He may snack on candy or cookies during the day. He reports he does take an MVI.  UBW was 150 lbs, which he reports he last weighed this only 6-7 months ago and lately his weight has been fluctuating. No weight in chart where patient was 150 lbs. In the past year it has fluctuated between 95-115 lbs.  Meal  Completion: 100% of breakfast and lunch today (1449 kcal, 46 grams of protein so far today)  Medications reviewed and include: Vitamin D 3734 units daily, folic acid 1 mg daily, Ativan per CIWA (parameters not met so far), Remeron 7.5 mg daily, MVI daily, pantoprazole, thiamine 100 mg daily, NS @ 75 ml/hr.  Labs reviewed: Sodium 127, Chloride 93.   Nutrition-Focused physical exam completed. Findings are severe fat depletion, severe muscle depletion, and no edema. Poor dentition. Hair seemed dry/dull, but could be related to hygiene as his fingernails were dirty.  Diet Order:  Diet regular Room service appropriate? Yes; Fluid consistency: Thin  Skin:  Reviewed, no issues  Last BM:  Unknown  Height:   Ht Readings from Last 1 Encounters:  08/07/16 '5\' 5"'  (1.651 m)    Weight:   Wt Readings from Last 1 Encounters:  08/07/16 102 lb 14.4 oz (46.7 kg)    Ideal Body Weight:  61.8 kg  BMI:  Body mass index is 17.12 kg/m.  Estimated Nutritional Needs:   Kcal:  1560-1800 (MSJ x 1.3-1.5)  Protein:  70-85 grams (1.5-1.8 grams/kg)  Fluid:  1.5-1.8 L/day  EDUCATION NEEDS:   Education needs addressed  Willey Blade, MS, RD, LDN Pager: [phone] After Hours Pager: [phone]

## 2016-08-08 NOTE — Progress Notes (Signed)
[facility] - [place] [facility]   PATIENT NAME: Caleb Carpenter    MR#:  [id number]  DATE OF BIRTH:  04/21/1955  SUBJECTIVE:  CHIEF COMPLAINT:   Chief Complaint  Patient presents with  . Chest Pain   - Breathing is much better today. Has a left sided chest tube with no drainage, has minimal air leak present.  REVIEW OF SYSTEMS:  Review of Systems  Constitutional: Negative for chills, fever and malaise/fatigue.  HENT: Negative for congestion, ear discharge, hearing loss and nosebleeds.   Eyes: Negative for blurred vision and double vision.  Respiratory: Negative for cough, shortness of breath and wheezing.   Cardiovascular: Negative for chest pain, palpitations and leg swelling.  Gastrointestinal: Negative for abdominal pain, constipation, diarrhea, nausea and vomiting.  Genitourinary: Negative for dysuria.  Musculoskeletal: Negative for myalgias.  Neurological: Negative for dizziness, speech change, focal weakness, seizures and headaches.  Psychiatric/Behavioral: Negative for depression.    DRUG ALLERGIES:   Allergies  Allergen Reactions  . Codeine Other (See Comments)    Nasal drip    VITALS:  Blood pressure 110/65, pulse 60, temperature 97.8 F (36.6 C), temperature source Oral, resp. rate 20, height 5\' 5"  (1.651 m), weight 46.7 kg (102 lb 14.4 oz), SpO2 100 %.  PHYSICAL EXAMINATION:  Physical Exam  GENERAL:  61 y.o.-year-old patient lying in the bed with no acute distress.  EYES: Pupils equal, round, reactive to light and accommodation. No scleral icterus. Extraocular muscles intact.  HEENT: Head atraumatic, normocephalic. Oropharynx and nasopharynx clear.  NECK:  Supple, no jugular venous distention. No thyroid enlargement, no tenderness.  LUNGS: Normal breath sounds bilaterally, no wheezing, rales,rhonchi or crepitation. No use of accessory muscles of respiration. Decreased left basilar breath sounds CARDIOVASCULAR: S1, S2 normal. No murmurs,  rubs, or gallops.  ABDOMEN: Soft, nontender, nondistended. Bowel sounds present. No organomegaly or mass.  EXTREMITIES: No pedal edema, cyanosis, or clubbing.  NEUROLOGIC: Cranial nerves II through XII are intact. Muscle strength 5/5 in all extremities. Sensation intact. Gait not checked.  PSYCHIATRIC: The patient is alert and oriented x 3.  SKIN: No obvious rash, lesion, or ulcer.    LABORATORY PANEL:   CBC  Recent Labs Lab 08/08/16 0458  WBC 4.6  HGB 11.1*  HCT 31.8*  PLT 148*   ------------------------------------------------------------------------------------------------------------------  Chemistries   Recent Labs Lab 08/08/16 0458  NA 127*  K 4.5  CL 93*  CO2 27  GLUCOSE 260*  BUN 9  CREATININE 0.62  CALCIUM 8.3*   ------------------------------------------------------------------------------------------------------------------  Cardiac Enzymes  Recent Labs Lab 08/07/16 1609  TROPONINI <0.03   ------------------------------------------------------------------------------------------------------------------  RADIOLOGY:  Dg Chest 2 View  Result Date: 08/07/2016 CLINICAL DATA:  Initial evaluation for acute left-sided chest pain. EXAM: CHEST  2 VIEW COMPARISON:  Prior radiograph from 09/02/2015. FINDINGS: Cardiac and mediastinal silhouettes are stable in size and contour, and remain within normal limits. Trace left-to-right mediastinal shift. Lungs are hyperinflated with severe emphysematous changes. There is an acute left-sided pneumothorax. Overall volume estimated approximate lean 25%. Associated left basilar atelectasis. No focal infiltrates. No pulmonary edema. There is a small layering left pleural effusion. There are acute fractures of the left posterior sixth and seventh ribs, new from prior. Additional remotely healed old left-sided posterior rib fractures noted. Compression deformity within the lower thoracic spine is grossly stable from previous.  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 No other definite acute osseous abnormality. IMPRESSION: 1. Acute left pneumothorax. Estimated volume approximately  25%. Trace left-to-right mediastinal shift. Associated small left pleural effusion (hydropneumothorax). 2. New/acute fractures of the left posterior sixth and seventh ribs. Query recent trauma. 3. Emphysema. 4. 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 Critical Value/emergent results were called by telephone at the time of interpretation on 08/07/2016 at 5:14 pm to Dr. Dorothea Glassman , who verbally acknowledged these results. Electronically Signed   By: Rise Mu M.D.   On: 08/07/2016 17:20   Ct Head Wo Contrast  Result Date: 08/07/2016 CLINICAL DATA:  Syncope next inguinal 06/26/2005 EXAM: CT HEAD WITHOUT CONTRAST TECHNIQUE: Contiguous axial images were obtained from the base of the skull through the vertex without intravenous contrast. COMPARISON:  06/26/2005 FINDINGS: Brain: Mild diffuse cerebral atrophy. No acute intracranial abnormality. Specifically, no hemorrhage, hydrocephalus, mass lesion, acute infarction, or significant intracranial injury. Vascular: No hyperdense vessel or unexpected calcification. Skull: No acute calvarial abnormality. Sinuses/Orbits: Mucosal thickening in the right sphenoid sinus and right maxillary sinus. No air-fluid levels. Mastoid air cells are clear. Orbital soft tissues unremarkable. Other: None IMPRESSION: No acute intracranial abnormality. Mild right chronic sinusitis. Electronically Signed   By: Charlett Nose M.D.   On: 08/07/2016 17:31   Ct Chest W Contrast  Result Date: 08/07/2016 CLINICAL DATA:  Recent syncopal episode with fall, chest pain and known pneumothorax on the left. EXAM: CT CHEST WITH CONTRAST TECHNIQUE: Multidetector CT imaging of the chest was performed during intravenous contrast administration. CONTRAST:   60mL ISOVUE-300 IOPAMIDOL (ISOVUE-300) INJECTION 61% COMPARISON:  Plain film from earlier in the same day. FINDINGS: Cardiovascular: Atherosclerotic calcifications of the aorta and its branches are noted. The ascending aorta measures 3.4 cm in greatest dimension. Descending thoracic aorta demonstrates mural thrombus/ soft atherosclerotic plaque The pulmonary artery as visualized is within normal limits. Coronary calcifications are seen. No cardiac enlargement is noted. Mediastinum/Nodes: Thoracic inlet is within normal limits. No significant hilar or mediastinal adenopathy is noted. Mild hilar calcifications are seen consistent with prior granulomatous disease. The esophagus as visualize is within normal limits. Lungs/Pleura: There again noted changes consistent with the small left pneumothorax. The largest component of this lies laterally at the base. No chest tube is noted at this time. Diffuse emphysematous changes are seen. No focal infiltrate or sizable effusion is noted. Minimal bibasilar atelectasis is seen. Upper Abdomen: Visualized upper abdomen is within normal limits with the exception of the mural thrombus/ soft atherosclerotic plaque in the proximal aorta. Some plaque is also noted in the origin of the superior mesenteric artery. Musculoskeletal: Degenerative changes of the thoracic spine are noted. Multiple left rib fractures are noted posteriorly involving the seventh eighth and ninth ribs posteriorly. Healed fractures of the left tenth and eleventh ribs are seen. IMPRESSION: Multiple acute rib fractures on the left with associated pneumothorax. The predominance of the pneumothorax is in the base laterally. Diffuse soft atherosclerotic plaque versus mural thrombus within the descending thoracic aorta and abdominal aorta. No aneurysmal dilatation is seen. 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 Electronically Signed   By: Alcide Clever M.D.   On: 08/07/2016 18:10   Dg Chest  Port 1 View  Result Date: 08/08/2016 CLINICAL DATA:  61 year old male with pneumothorax. EXAM: PORTABLE CHEST 1 VIEW COMPARISON:  Chest radiograph dated 08/07/2016 FINDINGS: A left-sided chest tube remains in stable position. There is emphysematous changes of the lungs. Left lung base densities likely represent atelectatic changes. There is no focal consolidation. No significant pleural effusion. No pneumothorax identified.  The cardiac silhouette is within normal limits. There is atherosclerotic calcification of the thoracic aorta. There is osteopenia. Multiple old healed left posterior rib fracture. Acute or subacute appearing fractures of the left posterior seventh and eighth ribs noted. There is an old fracture of the right humeral neck with nonunion. IMPRESSION: 1. Stable positioning of the left-sided chest tube. No residual pneumothorax identified. 2. Multiple left posterior rib fractures. 3. 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 Electronically Signed   By: Elgie Collard M.D.   On: 08/08/2016 06:33   Dg Chest Portable 1 View  Result Date: 08/07/2016 CLINICAL DATA:  Chest tube placement. EXAM: PORTABLE CHEST 1 VIEW COMPARISON:  CT chest from the same day. FINDINGS: A left-sided chest tube is now place. No significant residual pneumothorax is present. Multiple left-sided rib fractures are again seen. The nonunion right humerus fracture is again seen. Centrilobular emphysema is noted. IMPRESSION: 1. Interval placement of left-sided chest tube without residual pneumothorax. 2. Multiple left-sided rib fractures. 3. Nonunion right humerus fracture. Electronically Signed   By: Marin Roberts M.D.   On: 08/07/2016 19:28    EKG:   Orders placed or performed during the hospital encounter of 08/07/16  . ED EKG within 10 minutes  . ED EKG within 10 minutes    ASSESSMENT AND PLAN:   61 year old male with past medical history significant for ongoing smoking, tobacco chewing, COPD not on  home oxygen, hypertension presents to hospital after a fall and left-sided chest pain and noted to have pneumothorax.  #1 left-sided pneumothorax- appreciate surgical consult Chest tube placed, CXR this AM showing resolution, minimal air leak Will await surgical input to see when chest tube can be removed  #2 chest pain-secondary to rib fractures on the left side. -Continue pain medications as needed.  #3 hyponatremia- poor oral intake, hypovolemic hyponatremia. Continue IV fluids for today.  #4 tobacco use disorder-strongly counseled. Will add nicotine patch  #5, alcohol abuse-CIWA protocol. No withdrawal symptoms noted  #6 depression and anxiety-continue home medications. Patient on Remeron, Wellbutrin  #7 DVT prophylaxis- lovenox  Anticipate discharge tomorrow     All the records are reviewed and case discussed with Care Management/Social Workerr. Management plans discussed with the patient, family and they are in agreement.  CODE STATUS: Full code  TOTAL TIME TAKING CARE OF THIS PATIENT: 38 minutes.   POSSIBLE D/C IN 1-2DAYS, DEPENDING ON CLINICAL CONDITION.   Enid Baas M.D on 08/08/2016 at 9:38 AM  Between 7am to 6pm - Pager - [phone]  After 6pm go to [email or web address] - Social research officer, government  [facility]  Office  [phone]  CC: Primary care physician; No PCP Per Patient

## 2016-08-09 ENCOUNTER — Inpatient Hospital Stay: Payer: Medicaid Other

## 2016-08-09 LAB — PHOSPHORUS: PHOSPHORUS: 2 mg/dL — AB (ref 2.5–4.6)

## 2016-08-09 LAB — BASIC METABOLIC PANEL
Anion gap: 4 — ABNORMAL LOW (ref 5–15)
BUN: 8 mg/dL (ref 6–20)
CALCIUM: 7.9 mg/dL — AB (ref 8.9–10.3)
CO2: 27 mmol/L (ref 22–32)
CREATININE: 0.56 mg/dL — AB (ref 0.61–1.24)
Chloride: 97 mmol/L — ABNORMAL LOW (ref 101–111)
Glucose, Bld: 96 mg/dL (ref 65–99)
Potassium: 3.5 mmol/L (ref 3.5–5.1)
SODIUM: 128 mmol/L — AB (ref 135–145)

## 2016-08-09 LAB — HIV ANTIBODY (ROUTINE TESTING W REFLEX): HIV Screen 4th Generation wRfx: NONREACTIVE

## 2016-08-09 LAB — MAGNESIUM: MAGNESIUM: 1.4 mg/dL — AB (ref 1.7–2.4)

## 2016-08-09 MED ORDER — POTASSIUM & SODIUM PHOSPHATES 280-160-250 MG PO PACK
1.0000 | PACK | Freq: Three times a day (TID) | ORAL | Status: AC
  Administered 2016-08-09 (×2): 1 via ORAL
  Filled 2016-08-09 (×3): qty 1

## 2016-08-09 MED ORDER — MAGNESIUM SULFATE 2 GM/50ML IV SOLN
2.0000 g | Freq: Once | INTRAVENOUS | Status: AC
  Administered 2016-08-09: 2 g via INTRAVENOUS
  Filled 2016-08-09: qty 50

## 2016-08-09 NOTE — Progress Notes (Signed)
08/09/2016  Subjective: No acute events overnight.  Patient denies any significant left sided chest pain. Chest tube in place to suction.  Vital signs: Temp:  [97.8 F (36.6 C)-98.6 F (37 C)] 98 F (36.7 C) (04/27 1144) Pulse Rate:  [65-92] 68 (04/27 1144) Resp:  [17-20] 20 (04/27 1144) BP: (109-131)/(63-84) 109/63 (04/27 1144) SpO2:  [95 %-96 %] 96 % (04/27 1144)   Intake/Output: 04/26 0701 - 04/27 0700 In: 1185 [P.O.:720; I.V.:465] Out: 1435 [Urine:1435]    Physical Exam: Constitutional: No acute distress Pulm:  Small air-leak in pleuravac this morning, particularly with coughing.  Chest tube in place with good seals between connections.  No respiratory distress and clear lungs.  Labs:   Recent Labs  08/07/16 1609 08/08/16 0458  WBC 8.7 4.6  HGB 12.9* 11.1*  HCT 37.0* 31.8*  PLT 172 148*    Recent Labs  08/08/16 0458 08/09/16 0515  NA 127* 128*  K 4.5 3.5  CL 93* 97*  CO2 27 27  GLUCOSE 260* 96  BUN 9 8  CREATININE 0.62 0.56*  CALCIUM 8.3* 7.9*    Recent Labs  08/07/16 1823  LABPROT 14.1  INR 1.09    Imaging: Dg Chest 1 View  Result Date: 08/09/2016 CLINICAL DATA:  Pneumothorax. EXAM: CHEST 1 VIEW COMPARISON:  08/08/2016 FINDINGS: Pigtail pleural drainage catheter is again noted on the left. No visible pneumothorax. Underline COPD. Left rib fractures are again noted. Minimal left base atelectasis and small left effusion. IMPRESSION: Left chest tube remains in place.  No visible pneumothorax. COPD. Left base atelectasis.  Small left effusion. Electronically Signed   By: Charlett Nose M.D.   On: 08/09/2016 08:12    Assessment/Plan: 61 yo male with left pneumothorax and rib fractures.  --I have personally viewed the patient's CXR this morning.  There is no residual pneumothorax on CXR, but he still has a small airleak today on exam.  Will leave chest tube to suction today for an additional 24 hrs.  Tomorrow, could possibly change to waterseal depending  on CXR and leak. --patient may ambulate with chest tube to waterseal, but needs to be placed back to suction afterwards.   Howie Ill, MD [facility]

## 2016-08-09 NOTE — Progress Notes (Signed)
[facility] - [place] at [facility]   PATIENT NAME: Caleb Carpenter    MR#:  [id number]  DATE OF BIRTH:  18-Nov-1955  SUBJECTIVE:  CHIEF COMPLAINT:   Chief Complaint  Patient presents with  . Chest Pain   - No complaints. Has some cough. Able to eat and drink well.  REVIEW OF SYSTEMS:  Review of Systems  Constitutional: Negative for chills, fever and malaise/fatigue.  HENT: Negative for congestion, ear discharge, hearing loss and nosebleeds.   Eyes: Negative for blurred vision and double vision.  Respiratory: Positive for cough. Negative for shortness of breath and wheezing.   Cardiovascular: Negative for chest pain, palpitations and leg swelling.  Gastrointestinal: Negative for abdominal pain, constipation, diarrhea, nausea and vomiting.  Genitourinary: Negative for dysuria.  Musculoskeletal: Negative for myalgias.  Neurological: Negative for dizziness, speech change, focal weakness, seizures and headaches.  Psychiatric/Behavioral: Negative for depression.    DRUG ALLERGIES:   Allergies  Allergen Reactions  . Codeine Other (See Comments)    Nasal drip    VITALS:  Blood pressure 128/77, pulse 77, temperature 98.6 F (37 C), temperature source Oral, resp. rate 20, height 5\' 5"  (1.651 m), weight 46.7 kg (102 lb 14.4 oz), SpO2 96 %.  PHYSICAL EXAMINATION:  Physical Exam  GENERAL:  61 y.o.-year-old patient lying in the bed with no acute distress.  EYES: Pupils equal, round, reactive to light and accommodation. No scleral icterus. Extraocular muscles intact.  HEENT: Head atraumatic, normocephalic. Oropharynx and nasopharynx clear.  NECK:  Supple, no jugular venous distention. No thyroid enlargement, no tenderness.  LUNGS: Normal breath sounds bilaterally, no wheezing, rales,rhonchi or crepitation. No use of accessory muscles of respiration. Decreased left basilar breath sounds CARDIOVASCULAR: S1, S2 normal. No murmurs, rubs, or gallops.  ABDOMEN: Soft,  nontender, nondistended. Bowel sounds present. No organomegaly or mass.  EXTREMITIES: No pedal edema, cyanosis, or clubbing.  NEUROLOGIC: Cranial nerves II through XII are intact. Muscle strength 5/5 in all extremities. Sensation intact. Gait not checked.  PSYCHIATRIC: The patient is alert and oriented x 3.  SKIN: No obvious rash, lesion, or ulcer.    LABORATORY PANEL:   CBC  Recent Labs Lab 08/08/16 0458  WBC 4.6  HGB 11.1*  HCT 31.8*  PLT 148*   ------------------------------------------------------------------------------------------------------------------  Chemistries   Recent Labs Lab 08/09/16 0515  NA 128*  K 3.5  CL 97*  CO2 27  GLUCOSE 96  BUN 8  CREATININE 0.56*  CALCIUM 7.9*  MG 1.4*   ------------------------------------------------------------------------------------------------------------------  Cardiac Enzymes  Recent Labs Lab 08/07/16 1609  TROPONINI <0.03   ------------------------------------------------------------------------------------------------------------------  RADIOLOGY:  Dg Chest 1 View  Result Date: 08/09/2016 CLINICAL DATA:  Pneumothorax. EXAM: CHEST 1 VIEW COMPARISON:  08/08/2016 FINDINGS: Pigtail pleural drainage catheter is again noted on the left. No visible pneumothorax. Underline COPD. Left rib fractures are again noted. Minimal left base atelectasis and small left effusion. IMPRESSION: Left chest tube remains in place.  No visible pneumothorax. COPD. Left base atelectasis.  Small left effusion. Electronically Signed   By: Charlett Nose M.D.   On: 08/09/2016 08:12   Dg Chest 2 View  Result Date: 08/07/2016 CLINICAL DATA:  Initial evaluation for acute left-sided chest pain. EXAM: CHEST  2 VIEW COMPARISON:  Prior radiograph from 09/02/2015. FINDINGS: Cardiac and mediastinal silhouettes are stable in size and contour, and remain within normal limits. Trace left-to-right mediastinal shift. Lungs are hyperinflated with severe  emphysematous changes. There is an acute left-sided pneumothorax. Overall volume estimated approximate  lean 25%. Associated left basilar atelectasis. No focal infiltrates. No pulmonary edema. There is a small layering left pleural effusion. There are acute fractures of the left posterior sixth and seventh ribs, new from prior. Additional remotely healed old left-sided posterior rib fractures noted. Compression deformity within the lower thoracic spine is grossly stable from previous. 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 No other definite acute osseous abnormality. IMPRESSION: 1. Acute left pneumothorax. Estimated volume approximately 25%. Trace left-to-right mediastinal shift. Associated small left pleural effusion (hydropneumothorax). 2. New/acute fractures of the left posterior sixth and seventh ribs. Query recent trauma. 3. Emphysema. 4. 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 Critical Value/emergent results were called by telephone at the time of interpretation on 08/07/2016 at 5:14 pm to Dr. Dorothea Glassman , who verbally acknowledged these results. Electronically Signed   By: Rise Mu M.D.   On: 08/07/2016 17:20   Ct Head Wo Contrast  Result Date: 08/07/2016 CLINICAL DATA:  Syncope next inguinal 06/26/2005 EXAM: CT HEAD WITHOUT CONTRAST TECHNIQUE: Contiguous axial images were obtained from the base of the skull through the vertex without intravenous contrast. COMPARISON:  06/26/2005 FINDINGS: Brain: Mild diffuse cerebral atrophy. No acute intracranial abnormality. Specifically, no hemorrhage, hydrocephalus, mass lesion, acute infarction, or significant intracranial injury. Vascular: No hyperdense vessel or unexpected calcification. Skull: No acute calvarial abnormality. Sinuses/Orbits: Mucosal thickening in the right sphenoid sinus and right maxillary sinus. No air-fluid levels. Mastoid air cells are clear.  Orbital soft tissues unremarkable. Other: None IMPRESSION: No acute intracranial abnormality. Mild right chronic sinusitis. Electronically Signed   By: Charlett Nose M.D.   On: 08/07/2016 17:31   Ct Chest W Contrast  Result Date: 08/07/2016 CLINICAL DATA:  Recent syncopal episode with fall, chest pain and known pneumothorax on the left. EXAM: CT CHEST WITH CONTRAST TECHNIQUE: Multidetector CT imaging of the chest was performed during intravenous contrast administration. CONTRAST:  60mL ISOVUE-300 IOPAMIDOL (ISOVUE-300) INJECTION 61% COMPARISON:  Plain film from earlier in the same day. FINDINGS: Cardiovascular: Atherosclerotic calcifications of the aorta and its branches are noted. The ascending aorta measures 3.4 cm in greatest dimension. Descending thoracic aorta demonstrates mural thrombus/ soft atherosclerotic plaque The pulmonary artery as visualized is within normal limits. Coronary calcifications are seen. No cardiac enlargement is noted. Mediastinum/Nodes: Thoracic inlet is within normal limits. No significant hilar or mediastinal adenopathy is noted. Mild hilar calcifications are seen consistent with prior granulomatous disease. The esophagus as visualize is within normal limits. Lungs/Pleura: There again noted changes consistent with the small left pneumothorax. The largest component of this lies laterally at the base. No chest tube is noted at this time. Diffuse emphysematous changes are seen. No focal infiltrate or sizable effusion is noted. Minimal bibasilar atelectasis is seen. Upper Abdomen: Visualized upper abdomen is within normal limits with the exception of the mural thrombus/ soft atherosclerotic plaque in the proximal aorta. Some plaque is also noted in the origin of the superior mesenteric artery. Musculoskeletal: Degenerative changes of the thoracic spine are noted. Multiple left rib fractures are noted posteriorly involving the seventh eighth and ninth ribs posteriorly. Healed fractures of  the left tenth and eleventh ribs are seen. IMPRESSION: Multiple acute rib fractures on the left with associated pneumothorax. The predominance of the pneumothorax is in the base laterally. Diffuse soft atherosclerotic plaque versus mural thrombus within the descending thoracic aorta and abdominal aorta. No aneurysmal dilatation is seen. 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 Electronically Signed   By: Alcide Clever M.D.   On: 08/07/2016 18:10   Dg Chest Port 1 View  Result Date: 08/08/2016 CLINICAL DATA:  61 year old male with pneumothorax. EXAM: PORTABLE CHEST 1 VIEW COMPARISON:  Chest radiograph dated 08/07/2016 FINDINGS: A left-sided chest tube remains in stable position. There is emphysematous changes of the lungs. Left lung base densities likely represent atelectatic changes. There is no focal consolidation. No significant pleural effusion. No pneumothorax identified. The cardiac silhouette is within normal limits. There is atherosclerotic calcification of the thoracic aorta. There is osteopenia. Multiple old healed left posterior rib fracture. Acute or subacute appearing fractures of the left posterior seventh and eighth ribs noted. There is an old fracture of the right humeral neck with nonunion. IMPRESSION: 1. Stable positioning of the left-sided chest tube. No residual pneumothorax identified. 2. Multiple left posterior rib fractures. 3. 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 Electronically Signed   By: Elgie Collard M.D.   On: 08/08/2016 06:33   Dg Chest Portable 1 View  Result Date: 08/07/2016 CLINICAL DATA:  Chest tube placement. EXAM: PORTABLE CHEST 1 VIEW COMPARISON:  CT chest from the same day. FINDINGS: A left-sided chest tube is now place. No significant residual pneumothorax is present. Multiple left-sided rib fractures are again seen. The nonunion right humerus fracture is again seen. Centrilobular emphysema is noted. IMPRESSION: 1. Interval placement  of left-sided chest tube without residual pneumothorax. 2. Multiple left-sided rib fractures. 3. Nonunion right humerus fracture. Electronically Signed   By: Marin Roberts M.D.   On: 08/07/2016 19:28    EKG:   Orders placed or performed during the hospital encounter of 08/07/16  . ED EKG within 10 minutes  . ED EKG within 10 minutes    ASSESSMENT AND PLAN:   61 year old male with past medical history significant for ongoing smoking, tobacco chewing, COPD not on home oxygen, hypertension presents to hospital after a fall and left-sided chest pain and noted to have pneumothorax.  #1 left-sided pneumothorax- appreciate surgical consult Chest tube placed, CXR showing resolution, minimal air leak Chest tube will stay in today and follow up tomorrow with surgery. Added cough medications.  #2 chest pain-secondary to rib fractures on the left side. -Continue pain medications as needed.Much improved today  #3 hyponatremia- poor oral intake, hypovolemic hyponatremia. Improving with fluids. Likely has some chronic hyponatremia from alcohol abuse. -We'll encourage oral intake. Discontinue fluids today  #4 tobacco use disorder-strongly counseled. on nicotine patch  #5, alcohol abuse-CIWA protocol. No withdrawal symptoms noted  #6 depression and anxiety-continue home medications. Patient on Remeron, Wellbutrin  #7 DVT prophylaxis- lovenox  Anticipate discharge tomorrow- Please discuss with the surgical team.     All the records are reviewed and case discussed with Care Management/Social Workerr. Management plans discussed with the patient, family and they are in agreement.  CODE STATUS: Full code  TOTAL TIME TAKING CARE OF THIS PATIENT: 38 minutes.   POSSIBLE D/C TOMORROW, DEPENDING ON CLINICAL CONDITION.   Enid Baas M.D on 08/09/2016 at 10:07 AM  Between 7am to 6pm - Pager - [phone]  After 6pm go to [email or web address] - Social research officer, government  [facility] [place]  [facility]  Office  [phone]  CC: Primary care physician; No PCP Per Patient

## 2016-08-10 ENCOUNTER — Inpatient Hospital Stay: Payer: Medicaid Other

## 2016-08-10 DIAGNOSIS — R410 Disorientation, unspecified: Secondary | ICD-10-CM

## 2016-08-10 LAB — BASIC METABOLIC PANEL
ANION GAP: 6 (ref 5–15)
BUN: 12 mg/dL (ref 6–20)
CHLORIDE: 89 mmol/L — AB (ref 101–111)
CO2: 32 mmol/L (ref 22–32)
Calcium: 8.4 mg/dL — ABNORMAL LOW (ref 8.9–10.3)
Creatinine, Ser: 0.64 mg/dL (ref 0.61–1.24)
GFR calc non Af Amer: >60 mL/min (ref >60)
Glucose, Bld: 89 mg/dL (ref 65–99)
Potassium: 4.2 mmol/L (ref 3.5–5.1)
Sodium: 127 mmol/L — ABNORMAL LOW (ref 135–145)

## 2016-08-10 MED ORDER — LORAZEPAM 2 MG/ML IJ SOLN
2.0000 mg | Freq: Once | INTRAMUSCULAR | Status: AC
  Administered 2016-08-10: 2 mg via INTRAVENOUS
  Filled 2016-08-10: qty 1

## 2016-08-10 MED ORDER — SODIUM CHLORIDE 0.9 % IV SOLN
INTRAVENOUS | Status: DC
  Administered 2016-08-10 – 2016-08-11 (×3): via INTRAVENOUS

## 2016-08-10 MED ORDER — LORAZEPAM 2 MG/ML IJ SOLN
1.0000 mg | Freq: Once | INTRAMUSCULAR | Status: AC
  Administered 2016-08-10: 1 mg via INTRAVENOUS
  Filled 2016-08-10: qty 1

## 2016-08-10 NOTE — Progress Notes (Signed)
Dr Tobi Bastos notified of pt actively withdrawing from etoh, orders given for a one time  ativan dose then follow ciwa scale

## 2016-08-10 NOTE — Progress Notes (Signed)
CC:  PTX  Subjective: Now w delirium T. No resp distress Unable to perform ROS due to inability for him to communicate Cxr personally reviewed, CT in place lungs well aerated no pTX  Objective: Vital signs in last 24 hours: Temp:  [97.6 F (36.4 C)-98.4 F (36.9 C)] 97.6 F (36.4 C) (04/28 0330) Pulse Rate:  [68-81] 69 (04/28 0330) Resp:  [20] 20 (04/28 0330) BP: (109-154)/(63-97) 154/85 (04/28 0330) SpO2:  [95 %-97 %] 95 % (04/28 0330)    Intake/Output from previous day: 04/27 0701 - 04/28 0700 In: 600 [P.O.:600] Out: 1061 [Urine:1060; Stool:1] Intake/Output this shift: Total I/O In: 120 [P.O.:120] Out: -   Physical exam: NAD, alert but disoriented Chest: CTA, CT in place w tiny airleak during expiration and deep breaths only Abd: soft, NT Ext: well perfused and no edema  Lab Results: CBC   Recent Labs  08/07/16 1609 08/08/16 0458  WBC 8.7 4.6  HGB 12.9* 11.1*  HCT 37.0* 31.8*  PLT 172 148*   BMET  Recent Labs  08/09/16 0515 08/10/16 0338  NA 128* 127*  K 3.5 4.2  CL 97* 89*  CO2 27 32  GLUCOSE 96 89  BUN 8 12  CREATININE 0.56* 0.64  CALCIUM 7.9* 8.4*   PT/INR  Recent Labs  08/07/16 1823  LABPROT 14.1  INR 1.09   ABG No results for input(s): PHART, HCO3 in the last 72 hours.  Invalid input(s): PCO2, PO2  Studies/Results: Dg Chest 1 View  Result Date: 08/09/2016 CLINICAL DATA:  Pneumothorax. EXAM: CHEST 1 VIEW COMPARISON:  08/08/2016 FINDINGS: Pigtail pleural drainage catheter is again noted on the left. No visible pneumothorax. Underline COPD. Left rib fractures are again noted. Minimal left base atelectasis and small left effusion. IMPRESSION: Left chest tube remains in place.  No visible pneumothorax. COPD. Left base atelectasis.  Small left effusion. Electronically Signed   By: Charlett Nose M.D.   On: 08/09/2016 08:12   Dg Chest Port 1 View  Result Date: 08/10/2016 CLINICAL DATA:  Left-sided chest tube. EXAM: PORTABLE CHEST 1 VIEW  COMPARISON:  08/09/2016 FINDINGS: Left-sided chest tube projecting over the mediastinum. No pneumothorax. The lungs are hyperinflated likely secondary to COPD. No pleural effusion. Left basilar scarring. Stable cardiomediastinal silhouette. No acute osseous abnormality. IMPRESSION: 1. Left-sided chest tube without a pneumothorax. 2. Lungs are hyperinflated likely secondary to COPD. Electronically Signed   By: Elige Ko   On: 08/10/2016 08:51    Anti-infectives: Anti-infectives    None      Assessment/Plan: Place CT to water seal , small leak likely improve after being on water seal CXR in am ordered May ambulate if mentation allows pulm toilet No surgical intervention  Sterling Big, MD, [facility]  08/10/2016

## 2016-08-10 NOTE — Progress Notes (Signed)
Called Dr. Juliene Pina patient more agitated and trying to get up out of bed; sitter at bedside; redirect patient several times; order to give one time dose of Ativan now; Will continue to monitor.

## 2016-08-10 NOTE — Progress Notes (Signed)
[employer] - [place] at [facility]   PATIENT NAME: Caleb Carpenter    MR#:  [id number]  DATE OF BIRTH:  Sep 03, 1955  SUBJECTIVE:   Patient received Ativan this morning for EtOH withdrawal and is now sedated  REVIEW OF SYSTEMS:    Patient sedated from Ativan    Tolerating Diet: yes      DRUG ALLERGIES:   Allergies  Allergen Reactions  . Codeine Other (See Comments)    Nasal drip    VITALS:  Blood pressure (!) 154/85, pulse 69, temperature 97.6 F (36.4 C), temperature source Oral, resp. rate 20, height 5\' 5"  (1.651 m), weight 46.7 kg (102 lb 14.4 oz), SpO2 95 %.  PHYSICAL EXAMINATION:  Constitutional: Appears chronically ill appearing and very frail  Eyes: no scleral icterus.  Neck: Neck supple. No JVD. No tracheal deviation. CVS: RRR, S1/S2 +, no murmurs, no gallops, no carotid bruit.  Pulmonary: Effort and breath sounds normal, no stridor, rhonchi, wheezes, rales. Chest chest tube in place Abdominal: Soft. BS +,  no distension, tenderness, rebound or guarding.  Musculoskeletal: No edema and no tenderness.  Neuro: Sedated from Ativan Skin: Skin is warm and dry. No rash noted. Psychiatric: Sedated from Ativan     LABORATORY PANEL:   CBC  Recent Labs Lab 08/08/16 0458  WBC 4.6  HGB 11.1*  HCT 31.8*  PLT 148*   ------------------------------------------------------------------------------------------------------------------  Chemistries   Recent Labs Lab 08/09/16 0515 08/10/16 0338  NA 128* 127*  K 3.5 4.2  CL 97* 89*  CO2 27 32  GLUCOSE 96 89  BUN 8 12  CREATININE 0.56* 0.64  CALCIUM 7.9* 8.4*  MG 1.4*  --    ------------------------------------------------------------------------------------------------------------------  Cardiac Enzymes  Recent Labs Lab 08/07/16 1609  TROPONINI <0.03   ------------------------------------------------------------------------------------------------------------------  RADIOLOGY:  Dg  Chest 1 View  Result Date: 08/09/2016 CLINICAL DATA:  Pneumothorax. EXAM: CHEST 1 VIEW COMPARISON:  08/08/2016 FINDINGS: Pigtail pleural drainage catheter is again noted on the left. No visible pneumothorax. Underline COPD. Left rib fractures are again noted. Minimal left base atelectasis and small left effusion. IMPRESSION: Left chest tube remains in place.  No visible pneumothorax. COPD. Left base atelectasis.  Small left effusion. Electronically Signed   By: Charlett Nose M.D.   On: 08/09/2016 08:12   Dg Chest Port 1 View  Result Date: 08/10/2016 CLINICAL DATA:  Left-sided chest tube. EXAM: PORTABLE CHEST 1 VIEW COMPARISON:  08/09/2016 FINDINGS: Left-sided chest tube projecting over the mediastinum. No pneumothorax. The lungs are hyperinflated likely secondary to COPD. No pleural effusion. Left basilar scarring. Stable cardiomediastinal silhouette. No acute osseous abnormality. IMPRESSION: 1. Left-sided chest tube without a pneumothorax. 2. Lungs are hyperinflated likely secondary to COPD. Electronically Signed   By: Elige Ko   On: 08/10/2016 08:51     ASSESSMENT AND PLAN:   61 year old male with past medical history significant for ongoing smoking, tobacco chewing, COPD not on home oxygen, hypertension presents to hospital after a fall and left-sided chest pain and noted to have pneumothorax  1. Left-sided pneumothorax after mechanical fall and rib fractures Appreciate surgical consultation. Chest tube to waterseal as per surgery recommendations Chest x-ray in a.m.  2. Active EtOH withdrawal: Continue CIWA protocol  3. Rib fractures with chest pain: Continue when necessary pain medications   4. Hyponatremia from poor by mouth intake with ongoing EtOH abuse Sodium level has remained stable and I suspect that patient has chronic hyponatremia from chronic EtOH.   5. Depression: Continue Remeron  and Wellbutrin   Patient has sitter  CODE STATUS: FULL  TOTAL TIME TAKING CARE OF THIS  PATIENT: 25 minutes.     POSSIBLE D/C 1-2 days, DEPENDING ON CLINICAL CONDITION.   Garth Diffley M.D on 08/10/2016 at 10:43 AM  Between 7am to 6pm - Pager - [phone] After 6pm go to [email or web address] - Social research officer, government  [facility] [place] [facility]  Office  [phone]  CC: Primary care physician; No PCP Per Patient  Note: This dictation was prepared with Dragon dictation along with smaller phrase technology. Any transcriptional errors that result from this process are unintentional.

## 2016-08-11 ENCOUNTER — Inpatient Hospital Stay: Payer: Medicaid Other

## 2016-08-11 LAB — BASIC METABOLIC PANEL
ANION GAP: 6 (ref 5–15)
BUN: 6 mg/dL (ref 6–20)
CO2: 30 mmol/L (ref 22–32)
Calcium: 8.3 mg/dL — ABNORMAL LOW (ref 8.9–10.3)
Chloride: 90 mmol/L — ABNORMAL LOW (ref 101–111)
Creatinine, Ser: 0.56 mg/dL — ABNORMAL LOW (ref 0.61–1.24)
GFR calc Af Amer: >60 mL/min (ref >60)
Glucose, Bld: 89 mg/dL (ref 65–99)
POTASSIUM: 3.3 mmol/L — AB (ref 3.5–5.1)
SODIUM: 126 mmol/L — AB (ref 135–145)

## 2016-08-11 LAB — URIC ACID: URIC ACID, SERUM: 2.7 mg/dL — AB (ref 4.4–7.6)

## 2016-08-11 MED ORDER — LORAZEPAM 2 MG/ML IJ SOLN
1.0000 mg | Freq: Four times a day (QID) | INTRAMUSCULAR | Status: DC
  Administered 2016-08-11 (×4): 1 mg via INTRAVENOUS
  Filled 2016-08-11 (×4): qty 1

## 2016-08-11 NOTE — Plan of Care (Signed)
Problem: Education: Goal: Knowledge of [facility] Education information/materials will improve Outcome: Not Progressing Patient is detoxing and needs assistance.  Problem: Health Behavior/Discharge Planning: Goal: Ability to manage health-related needs will improve Outcome: Not Progressing Patient needs assistance.

## 2016-08-11 NOTE — Progress Notes (Signed)
[facility] - [facility]   PATIENT NAME: Caleb Carpenter    MR#:  [id number]  DATE OF BIRTH:  September 27, 1955  SUBJECTIVE:   Patient Was up this morning as per sitter at bedside. He is sleeping soundly at this time. REVIEW OF SYSTEMS:    Patient sedated from Ativan    Tolerating Diet: yes      DRUG ALLERGIES:   Allergies  Allergen Reactions  . Codeine Other (See Comments)    Nasal drip    VITALS:  Blood pressure (!) 130/97, pulse 62, temperature 97.5 F (36.4 C), temperature source Oral, resp. rate 18, height 5\' 5"  (1.651 m), weight 46.7 kg (102 lb 14.4 oz), SpO2 98 %.  PHYSICAL EXAMINATION:  Constitutional: Appears chronically ill appearing and very frail  Eyes: no scleral icterus.  Neck: Neck supple. No JVD. No tracheal deviation. CVS: RRR, S1/S2 +, no murmurs, no gallops, no carotid bruit.  Pulmonary: Effort and breath sounds normal, no stridor,Few scattered rhonchi, no wheezes, rales. Chest chest tube in place Abdominal: Soft. BS +,  no distension, tenderness, rebound or guarding.  Musculoskeletal: No edema and no tenderness.  Neuro: Sleeping  Skin: Skin is warm and dry. No rash noted. Psychiatric:  sleeping     LABORATORY PANEL:   CBC  Recent Labs Lab 08/08/16 0458  WBC 4.6  HGB 11.1*  HCT 31.8*  PLT 148*   ------------------------------------------------------------------------------------------------------------------  Chemistries   Recent Labs Lab 08/09/16 0515  08/11/16 0704  NA 128*  < > 126*  K 3.5  < > 3.3*  CL 97*  < > 90*  CO2 27  < > 30  GLUCOSE 96  < > 89  BUN 8  < > 6  CREATININE 0.56*  < > 0.56*  CALCIUM 7.9*  < > 8.3*  MG 1.4*  --   --   < > = values in this interval not displayed. ------------------------------------------------------------------------------------------------------------------  Cardiac Enzymes  Recent Labs Lab 08/07/16 1609  TROPONINI <0.03    ------------------------------------------------------------------------------------------------------------------  RADIOLOGY:  Dg Chest Port 1 View  Result Date: 08/11/2016 CLINICAL DATA:  Recent pneumothorax EXAM: PORTABLE CHEST 1 VIEW COMPARISON:  August 10, 2016 FINDINGS: Chest tube remains on the left without appreciable change in position. There is slight soft tissue air in the lateral left hemithorax. There is a skin fold on the left but no evident pneumothorax. There is a small left pleural effusion. There is slight bibasilar atelectasis. There is no edema or consolidation. The heart size and pulmonary vascularity are within normal limits. There is aortic atherosclerosis. There is old rib trauma on the left, unchanged. IMPRESSION: No change in chest tube position. Slight soft tissue air in the left hemithorax laterally but no evident pneumothorax. Small left pleural effusion with mild bibasilar atelectasis. No edema or consolidation. Stable cardiac silhouette. There is aortic atherosclerosis. Electronically Signed   By: Bretta Bang III M.D.   On: 08/11/2016 08:50   Dg Chest Port 1 View  Result Date: 08/10/2016 CLINICAL DATA:  Left-sided chest tube. EXAM: PORTABLE CHEST 1 VIEW COMPARISON:  08/09/2016 FINDINGS: Left-sided chest tube projecting over the mediastinum. No pneumothorax. The lungs are hyperinflated likely secondary to COPD. No pleural effusion. Left basilar scarring. Stable cardiomediastinal silhouette. No acute osseous abnormality. IMPRESSION: 1. Left-sided chest tube without a pneumothorax. 2. Lungs are hyperinflated likely secondary to COPD. Electronically Signed   By: Elige Ko   On: 08/10/2016 08:51     ASSESSMENT AND PLAN:   61 year old  male with past medical history significant for ongoing smoking, tobacco chewing, COPD not on home oxygen, hypertension presents to hospital after a fall and left-sided chest pain and noted to have pneumothorax  1. Left-sided  pneumothorax after mechanical fall and rib fractures Appreciate surgical consultation. Chest tube to waterseal as per surgery recommendations Chest x-ray Resolution of pneumothorax.   2. Active EtOH withdrawal: Continue CIWA protocol Continue sitter 3. Rib fractures with chest pain: Continue when necessary pain medications   4. Hyponatremia from poor by mouth intake with ongoing EtOH abuse  sodium level this morning after IV fluids has decreased.  Nephrology consult  Check uric acid  Stop IV fluids  Recheck BMP in a.m.  5. Depression: Continue Remeron and Wellbutrin   Patient has sitter  CODE STATUS: FULL  TOTAL TIME TAKING CARE OF THIS PATIENT: 21 minutes.     POSSIBLE D/C 1-2 days, DEPENDING ON CLINICAL CONDITION.   Stavros Cail M.D on 08/11/2016 at 10:26 AM  Between 7am to 6pm - Pager - [phone] After 6pm go to [email or web address] - Social research officer, government  [name]  Office  [phone]  CC: Primary care physician; No PCP Per Patient  Note: This dictation was prepared with Dragon dictation along with smaller phrase technology. Any transcriptional errors that result from this process are unintentional.

## 2016-08-11 NOTE — Progress Notes (Signed)
Called Dr. Tobi Bastos regarding order for ativan due to agitation.  Appropriate orders were placed.  [name] N  08/11/2016   1:29 AM

## 2016-08-11 NOTE — Progress Notes (Signed)
Patient sleeping

## 2016-08-11 NOTE — Progress Notes (Signed)
CC: PTX Subjective: Delirium main issue Apparently CT still on suction No resp issues CXR p. Reviewed no PTX CT in place  Objective: Vital signs in last 24 hours: Temp:  [97.5 F (36.4 C)-98.4 F (36.9 C)] 97.5 F (36.4 C) (04/29 0756) Pulse Rate:  [62-88] 62 (04/29 0756) Resp:  [18-20] 18 (04/29 0756) BP: (126-156)/(74-97) 126/74 (04/29 1200) SpO2:  [91 %-98 %] 98 % (04/29 0756) Last BM Date: 08/10/16  Intake/Output from previous day: 04/28 0701 - 04/29 0700 In: 905 [P.O.:240; I.V.:665] Out: 130 [Chest Tube:130] Intake/Output this shift: Total I/O In: 1620 [P.O.:240; I.V.:1380] Out: -   Physical exam: Sleeping comfortably, in NAD Chest CTA, CT in place, no airleak  Abd Soft , nt, no peritonitis Ext: well perfused, no edema  Lab Results: CBC  No results for input(s): WBC, HGB, HCT, PLT in the last 72 hours. BMET  Recent Labs  08/10/16 0338 08/11/16 0704  NA 127* 126*  K 4.2 3.3*  CL 89* 90*  CO2 32 30  GLUCOSE 89 89  BUN 12 6  CREATININE 0.64 0.56*  CALCIUM 8.4* 8.3*   PT/INR No results for input(s): LABPROT, INR in the last 72 hours. ABG No results for input(s): PHART, HCO3 in the last 72 hours.  Invalid input(s): PCO2, PO2  Studies/Results: Dg Chest Port 1 View  Result Date: 08/11/2016 CLINICAL DATA:  Recent pneumothorax EXAM: PORTABLE CHEST 1 VIEW COMPARISON:  August 10, 2016 FINDINGS: Chest tube remains on the left without appreciable change in position. There is slight soft tissue air in the lateral left hemithorax. There is a skin fold on the left but no evident pneumothorax. There is a small left pleural effusion. There is slight bibasilar atelectasis. There is no edema or consolidation. The heart size and pulmonary vascularity are within normal limits. There is aortic atherosclerosis. There is old rib trauma on the left, unchanged. IMPRESSION: No change in chest tube position. Slight soft tissue air in the left hemithorax laterally but no evident  pneumothorax. Small left pleural effusion with mild bibasilar atelectasis. No edema or consolidation. Stable cardiac silhouette. There is aortic atherosclerosis. Electronically Signed   By: Bretta Bang III M.D.   On: 08/11/2016 08:50   Dg Chest Port 1 View  Result Date: 08/10/2016 CLINICAL DATA:  Left-sided chest tube. EXAM: PORTABLE CHEST 1 VIEW COMPARISON:  08/09/2016 FINDINGS: Left-sided chest tube projecting over the mediastinum. No pneumothorax. The lungs are hyperinflated likely secondary to COPD. No pleural effusion. Left basilar scarring. Stable cardiomediastinal silhouette. No acute osseous abnormality. IMPRESSION: 1. Left-sided chest tube without a pneumothorax. 2. Lungs are hyperinflated likely secondary to COPD. Electronically Signed   By: Elige Ko   On: 08/10/2016 08:51    Anti-infectives: Anti-infectives    None      Assessment/Plan: Left PTX, CT to water seal  no surgical intervention Continue to follow CXR in am  Sterling Big, MD, [facility]  08/11/2016

## 2016-08-12 ENCOUNTER — Inpatient Hospital Stay: Payer: Medicaid Other

## 2016-08-12 DIAGNOSIS — J449 Chronic obstructive pulmonary disease, unspecified: Secondary | ICD-10-CM

## 2016-08-12 DIAGNOSIS — S270XXD Traumatic pneumothorax, subsequent encounter: Secondary | ICD-10-CM

## 2016-08-12 LAB — BASIC METABOLIC PANEL
Anion gap: 6 (ref 5–15)
BUN: 8 mg/dL (ref 6–20)
CALCIUM: 8.4 mg/dL — AB (ref 8.9–10.3)
CO2: 30 mmol/L (ref 22–32)
Chloride: 94 mmol/L — ABNORMAL LOW (ref 101–111)
Creatinine, Ser: 0.6 mg/dL — ABNORMAL LOW (ref 0.61–1.24)
GFR calc Af Amer: >60 mL/min (ref >60)
GLUCOSE: 91 mg/dL (ref 65–99)
Potassium: 3.8 mmol/L (ref 3.5–5.1)
SODIUM: 130 mmol/L — AB (ref 135–145)

## 2016-08-12 LAB — MAGNESIUM: MAGNESIUM: 1.5 mg/dL — AB (ref 1.7–2.4)

## 2016-08-12 LAB — PHOSPHORUS: Phosphorus: 3.9 mg/dL (ref 2.5–4.6)

## 2016-08-12 MED ORDER — MAGNESIUM SULFATE 4 GM/100ML IV SOLN
4.0000 g | Freq: Once | INTRAVENOUS | Status: AC
  Administered 2016-08-12: 4 g via INTRAVENOUS
  Filled 2016-08-12: qty 100

## 2016-08-12 NOTE — Evaluation (Signed)
Physical Therapy Evaluation Patient Details Name: Caleb Carpenter MRN: [id number] DOB: [birth year] Today's Date: 08/12/2016   History of Present Illness  Pt is a 61 y.o. male presenting with pneumothorax secondary to fractures of L ribs 7 and 8 after pt fell in the shower on 08/07/16. Pt had a chest tube placed on 08/07/16. 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 Pt has a known history of ETOH and tobacco dependence and PMH of HTN, COPD, and asthma.   Clinical Impression  Pt was sitting in bed watching TV with sitter at bedside upon entry, and pt was agreeable to PT evaluation. Pt lives on the lower level of a house with 3 STE with his brother and sister-in-law. Pt presents with slight weakness and decreased activity tolerance. PT evaluation limited by low BP (see vitals; RN notified). Pt was modified independent with bed mobility, min assist with transfers and ambulation, and tolerated exercises well. Pt will benefit from skilled PT to increase strength, activity tolerance, and ambulation distance once BP improves. Recommend HHPT at d/c to address deficits mentioned above and for pt to return to PLOF.    Follow Up Recommendations Home health PT    Equipment Recommendations       Recommendations for Other Services       Precautions / Restrictions Precautions Precautions: Fall Restrictions Weight Bearing Restrictions: No Other Position/Activity Restrictions: Chest tube      Mobility  Bed Mobility Overal bed mobility: Needs Assistance Bed Mobility: Supine to Sit     Supine to sit: Modified independent (Device/Increase time);HOB elevated;Supervision     General bed mobility comments: supervision for chest tube  Transfers Overall transfer level: Needs assistance   Transfers: Sit to/from Stand Sit to Stand: Min assist         General transfer comment: Pt required min assist to stand and stated he felt mildly  dizzy  Ambulation/Gait Ambulation/Gait assistance: Min assist Ambulation Distance (Feet): 3 Feet Assistive device: 1 person hand held assist     Gait velocity interpretation: Below normal speed for age/gender General Gait Details: Pt ambulated 3 ft with a shuffling gait and B knees slightly bent; v/c's provided to stand up tall; pt required min assist to keep balance w/ HHA and was slightly unsteady on his feet  Stairs            Wheelchair Mobility    Modified Rankin (Stroke Patients Only)       Balance Overall balance assessment: Needs assistance Sitting-balance support: No upper extremity supported;Feet supported Sitting balance-Leahy Scale: Good     Standing balance support: No upper extremity supported Standing balance-Leahy Scale: Fair Standing balance comment: pt reported feeling slightly dizzy in standing and required min assist for balance                             Pertinent Vitals/Pain Pain Assessment: No/denies pain; pt c/o L rib pain with coughing and educated on using a pillow to decrease pain when coughing BP sitting EOB: 92/63; BP after ambulating 3 ft to chair: 89/61; BP at end of eval: 94/62    Home Living Family/patient expects to be discharged to:: Private residence Living Arrangements: Other relatives (pt lives with his brother and sister-in-law) Available Help at Discharge: Family;Available PRN/intermittently Type of Home: House Home Access: Stairs to enter Entrance Stairs-Rails: None Entrance Stairs-Number of Steps: 3 Home Layout: Two level;Able to live on main  level with bedroom/bathroom Home Equipment: None      Prior Function Level of Independence: Independent               Hand Dominance        Extremity/Trunk Assessment   Upper Extremity Assessment Upper Extremity Assessment: Overall WFL for tasks assessed (B UE strength 5/5 for shoulder flexion and elbow flexion/extension; good grip strength)    Lower  Extremity Assessment Lower Extremity Assessment: Generalized weakness (B LE strength 4/5 hip flexion and 4+/5 knee flexion/extension)    Cervical / Trunk Assessment Cervical / Trunk Assessment: Normal  Communication   Communication: No difficulties  Cognition Arousal/Alertness: Awake/alert Behavior During Therapy: WFL for tasks assessed/performed Overall Cognitive Status: Within Functional Limits for tasks assessed                                        General Comments      Exercises General Exercises - Lower Extremity Long Arc Quad: AROM;Both;10 reps;Seated;Strengthening Heel Slides: AROM;Both;10 reps;Seated;Strengthening Hip ABduction/ADduction: AROM;Both;10 reps;Seated;Strengthening Straight Leg Raises: AROM;Left;AAROM;Right;10 reps;Seated;Strengthening Hip Flexion/Marching: AROM;Both;10 reps;Seated;Strengthening   Assessment/Plan    PT Assessment Patient needs continued PT services  PT Problem List Decreased strength;Decreased mobility;Decreased activity tolerance;Decreased balance;Pain;Cardiopulmonary status limiting activity       PT Treatment Interventions DME instruction;Therapeutic activities;Gait training;Therapeutic exercise;Patient/family education;Stair training;Balance training;Functional mobility training    PT Goals (Current goals can be found in the Care Plan section)  Acute Rehab PT Goals Patient Stated Goal: to go home PT Goal Formulation: With patient Time For Goal Achievement: 08/26/16 Potential to Achieve Goals: Good    Frequency Min 2X/week   Barriers to discharge        Co-evaluation               AM-PAC PT "6 Clicks" Daily Activity  Outcome Measure Difficulty turning over in bed (including adjusting bedclothes, sheets and blankets)?: None Difficulty moving from lying on back to sitting on the side of the bed? : None Difficulty sitting down on and standing up from a chair with arms (e.g., wheelchair, bedside commode,  etc,.)?: A Little Help needed moving to and from a bed to chair (including a wheelchair)?: A Little Help needed walking in hospital room?: A Little Help needed climbing 3-5 steps with a railing? : A Lot 6 Click Score: 19    End of Session Equipment Utilized During Treatment: Gait belt Activity Tolerance: Patient tolerated treatment well (Pt limited by dizziness and low BP (see vitals)) Patient left: in chair;with call bell/phone within reach;with nursing/sitter in room (sitter in room) Nurse Communication: Mobility status (low BP) PT Visit Diagnosis: Unsteadiness on feet (R26.81);Muscle weakness (generalized) (M62.81);History of falling (Z91.81);Pain;Dizziness and giddiness (R42) Pain - Right/Left: Left Pain - part of body:  (ribs)    Time: 1442-1510 PT Time Calculation (min) (ACUTE ONLY): 28 min   Charges:   PT Evaluation $PT Eval Low Complexity: 1 Procedure     PT G Codes:         Amarien Carne, SPT 08/12/2016, 4:46 PM

## 2016-08-12 NOTE — Progress Notes (Signed)
MEDICATION RELATED CONSULT NOTE - INITIAL   Pharmacy Consult for Electrolyte Monitoring Indication: hypomagnesemia, hypokalemia  Allergies  Allergen Reactions  . Codeine Other (See Comments)    Nasal drip    Patient Measurements: Height: 5\' 5"  (165.1 cm) Weight: 102 lb 14.4 oz (46.7 kg) IBW/kg (Calculated) : 61.5 Adjusted Body Weight:    Vital Signs: BP: 96/57 (04/30 1500) Pulse Rate: 73 (04/30 1500) Intake/Output from previous day: 04/29 0701 - 04/30 0700 In: 1620 [P.O.:240; I.V.:1380] Out: -  Intake/Output from this shift: Total I/O In: -  Out: 200 [Urine:200]  Labs:  Recent Labs  08/10/16 0338 08/11/16 0704 08/12/16 0318  CREATININE 0.64 0.56* 0.60*  MG  --   --  1.5*  PHOS  --   --  3.9   Lab Results  Component Value Date   K 3.8 08/12/2016   Estimated Creatinine Clearance: 63.2 mL/min (A) (by C-G formula based on SCr of 0.6 mg/dL (L)).   Microbiology: No results found for this or any previous visit (from the past 720 hour(s)).  Medical History: Past Medical History:  Diagnosis Date  . Asthma   . COPD (chronic obstructive pulmonary disease) (HCC)   . Hypertension      Assessment: 61 yo M with Pneumothorax s/p fall, ETOH.  K 3.8,  Mag 1.5,  Phos 3.9.  Scr 0.6  Plan:  Will give Magnesium 4 gram IV x 1. Will f/u labs in am.   Halston Kintz A 08/12/2016,3:59 PM

## 2016-08-12 NOTE — Progress Notes (Signed)
SURGICAL PROGRESS NOTE (cpt [phone])  Hospital Day(s): 5.   Post op day(s):  Marland Kitchen   Interval History: Patient seen and examined, no acute events or new complaints overnight, reports overall well-controlled Left-sided rib fracture-associated chest pain and denies pain at the site of the Left-sided thoracostomy tube. Otherwise, patient denies SOB or fever/chills. Chest tube was to water seal when patient was examined this morning.  Review of Systems:  Constitutional: denies fever, chills  HEENT: denies cough or congestion  Respiratory: denies any shortness of breath  Cardiovascular: chest pain as per interval history, denies chest pressure or palpitations  Gastrointestinal: denies abdominal pain, N/V, or diarrhea Genitourinary: denies burning with urination or urinary frequency Musculoskeletal: denies pain, decreased motor or sensation Integumentary: denies any other rashes or skin discolorations Neurological: denies HA or vision/hearing changes   Vital signs in last 24 hours: [min-max] current  Temp:  [97.5 F (36.4 C)-99.3 F (37.4 C)] 99.3 F (37.4 C) (04/29 2240) Pulse Rate:  [62-95] 95 (04/29 2240) Resp:  [18] 18 (04/29 2240) BP: (110-130)/(74-97) 110/78 (04/29 2240) SpO2:  [95 %-98 %] 95 % (04/29 2240)     Height: 5\' 5"  (165.1 cm) Weight: 102 lb 14.4 oz (46.7 kg) BMI (Calculated): 17.2   Intake/Output this shift:  No intake/output data recorded.   Intake/Output last 2 shifts:  @IOLAST2SHIFTS @   Physical Exam:  Constitutional: alert, cooperative and no distress  HENT: normocephalic without obvious abnormality  Eyes: PERRL, EOM's grossly intact and symmetric  Neuro: CN II - XII grossly intact and symmetric without deficit  Respiratory: breathing non-labored at rest, mild Left-sided chest and peri-thoracostomy tube pain to palpation Cardiovascular: regular rate and sinus rhythm  Gastrointestinal: soft, non-tender, and non-distended  Musculoskeletal: UE and LE FROM, no edema  or wounds, motor and sensation grossly intact, NT   Labs:  CBC:  Lab Results  Component Value Date   WBC 4.6 08/08/2016   RBC 3.24 (L) 08/08/2016   BMP:  Lab Results  Component Value Date   GLUCOSE 91 08/12/2016   CO2 30 08/12/2016   BUN 8 08/12/2016   CREATININE 0.60 (L) 08/12/2016   CALCIUM 8.4 (L) 08/12/2016     Imaging studies:  Chest x-ray (08/12/2016) - personally reviewed, compared to prior imaging study, and discussed with patient and medical team Left side chest tube remains in place. No pneumothorax identified. Pulmonary hyperinflation. Stable lung volumes. No pulmonary edema. No definite pleural effusion or acute pulmonary opacity. Stable cardiac size and mediastinal contours. Stable visualized osseous structures. Chronic proximal right humerus deformity. Acute and chronic left rib fractures.  Chest x-ray (08/11/2016) Chest tube remains on the left without appreciable change in position. There is slight soft tissue air in the lateral left hemithorax. There is a skin fold on the left but no evident pneumothorax. There is a small left pleural effusion. There is slight bibasilar atelectasis. There is no edema or consolidation. The heart size and pulmonary vascularity are within normal limits. There is aortic atherosclerosis. There is old rib trauma on the left, unchanged.  Assessment/Plan: (ICD-10's: S50XXD) 61 y.o. male with trauma-associated Left-sided pneumothorax without recurrence on water seal s/p fall from standing in shower, complicated by pertinent comorbidities including chronic alcohol dependence/abuse, COPD, and HTN.   - discussed with Dr. Thelma Barge, recommendations appreciated   - follow-up morning repeat chest x-ray, will likely remove chest tube tomorrow   - medical management and disposition as per primary medical team   All of the above findings and recommendations were  discussed with the patient, patient's RN, and the medical team, and all of patient's questions  were answered to his expressed satisfaction.  -- Scherrie Gerlach Earlene Plater, MD, RPVI [place]: [facility] General Surgery - Partnering for exceptional care. Office: [phone]

## 2016-08-12 NOTE — Consult Note (Signed)
[facility]  CONSULT NOTE    Date: 08/12/2016                  Patient Name:  Caleb Carpenter  MRN: [id number]  DOB: [birth year]  Age / Sex: 61 y.o., male         PCP: No PCP Per Patient                 Service Requesting Consult: Dr. Juliene Pina                 Reason for Consult: Hyponatremia            History of Present Illness: Caleb Carpenter is a 61 y.o. white male with COPD/asthma, depression,  Hypertension, ETOH abuse, who was admitted to [facility] on 08/07/2016 for Hyponatremia [E87.1] Hemothorax on left [J94.2] Traumatic pneumothorax, initial encounter [S27.0XXA] Syncope, unspecified syncope type [R55]  Nephrology consulted for hyponatremia Patient with sitter. Pleasantly confused.   Hospital course complicated by left pneumothorax which has required a chest tube placed secondary to COPD.   Medications: Outpatient medications: Prescriptions Prior to Admission  Medication Sig Dispense Refill Last Dose  . albuterol (PROVENTIL HFA;VENTOLIN HFA) 108 (90 Base) MCG/ACT inhaler Inhale 2 puffs into the lungs 2 (two) times daily.   PRN at PRN  . alendronate (FOSAMAX) 70 MG tablet Take 70 mg by mouth once a week. Take with a full glass of water on an empty stomach.   Past Week at Unknown time  . buPROPion (WELLBUTRIN XL) 150 MG 24 hr tablet Take 150 mg by mouth daily.   08/06/2016 at 2000  . Cholecalciferol (D 5000 PO) Take 5,000 Units by mouth daily.   UNKNOWN at UNKNOWN  . donepezil (ARICEPT) 5 MG tablet Take 5 mg by mouth at bedtime.   08/06/2016 at 2000  . fluticasone (FLONASE) 50 MCG/ACT nasal spray Place 2 sprays into both nostrils daily.   PRN at PRN  . folic acid (FOLVITE) 1 MG tablet Take 1 mg by mouth 2 (two) times daily.   08/06/2016 at 2000  . folic acid (FOLVITE) 400 MCG tablet Take 400 mcg by mouth daily.   UNKNOWN at UNKNOWN  . lisinopril (PRINIVIL,ZESTRIL) 20 MG tablet Take 20 mg by mouth daily.   Past Week at Unknown time  . meloxicam (MOBIC) 7.5 MG  tablet Take 1 tablet (7.5 mg total) by mouth daily. 30 tablet 2 Past Week at Unknown time  . metoprolol tartrate (LOPRESSOR) 25 MG tablet Take 25 mg by mouth 2 (two) times daily.   Past Month at Unknown time  . mirtazapine (REMERON) 7.5 MG tablet Take 1 tablet (7.5 mg total) by mouth at bedtime. 30 tablet 2 Past Week at 2000  . pantoprazole (PROTONIX) 40 MG tablet Take 1 tablet (40 mg total) by mouth 2 (two) times daily. 60 tablet 2 Past Week at Unknown time  . thiamine (VITAMIN B-1) 100 MG tablet Take 100 mg by mouth daily.   UNKNOWN at UNKNOWN  . traMADol (ULTRAM) 50 MG tablet Take 1 tablet (50 mg total) by mouth every 6 (six) hours as needed. (Patient not taking: Reported on 08/07/2016) 12 tablet 0 Completed Course at Unknown time    Current medications: Current Facility-Administered Medications  Medication Dose Route Frequency Provider Last Rate Last Dose  . acetaminophen (TYLENOL) tablet 650 mg  650 mg Oral Q6H PRN Adrian Saran, MD   650 mg at 08/08/16 2202   Or  . acetaminophen (  TYLENOL) suppository 650 mg  650 mg Rectal Q6H PRN Adrian Saran, MD      . albuterol (PROVENTIL) (2.5 MG/3ML) 0.083% nebulizer solution 2.5 mg  2.5 mg Inhalation BID PRN Adrian Saran, MD   2.5 mg at 08/09/16 2038  . benzonatate (TESSALON) capsule 100 mg  100 mg Oral TID Enid Baas, MD   100 mg at 08/12/16 1016  . bisacodyl (DULCOLAX) EC tablet 5 mg  5 mg Oral Daily PRN Adrian Saran, MD      . buPROPion (WELLBUTRIN XL) 24 hr tablet 150 mg  150 mg Oral Daily Sital Mody, MD   150 mg at 08/12/16 1016  . cholecalciferol (VITAMIN D) tablet 5,000 Units  5,000 Units Oral Daily Adrian Saran, MD   5,000 Units at 08/12/16 1015  . donepezil (ARICEPT) tablet 5 mg  5 mg Oral QHS Adrian Saran, MD   5 mg at 08/11/16 2235  . enoxaparin (LOVENOX) injection 40 mg  40 mg Subcutaneous Q24H Adrian Saran, MD   40 mg at 08/11/16 2234  . feeding supplement (ENSURE ENLIVE) (ENSURE ENLIVE) liquid 237 mL  237 mL Oral BID BM Enid Baas, MD    237 mL at 08/12/16 1000  . fluticasone (FLONASE) 50 MCG/ACT nasal spray 2 spray  2 spray Each Nare Daily PRN Adrian Saran, MD      . folic acid (FOLVITE) tablet 1 mg  1 mg Oral Daily Sital Mody, MD   1 mg at 08/12/16 1016  . hydrALAZINE (APRESOLINE) injection 10 mg  10 mg Intravenous Q6H PRN Adrian Saran, MD      . HYDROcodone-acetaminophen (NORCO/VICODIN) 5-325 MG per tablet 1-2 tablet  1-2 tablet Oral Q4H PRN Adrian Saran, MD   2 tablet at 08/12/16 0839  . iopamidol (ISOVUE-370) 76 % injection 80 mL  80 mL Intravenous Once PRN Arnaldo Natal, MD      . lisinopril (PRINIVIL,ZESTRIL) tablet 20 mg  20 mg Oral Daily Adrian Saran, MD   20 mg at 08/12/16 1016  . LORazepam (ATIVAN) injection 1 mg  1 mg Intravenous Q6H Pavan Pyreddy, MD   1 mg at 08/11/16 1952  . metoprolol tartrate (LOPRESSOR) tablet 25 mg  25 mg Oral BID Adrian Saran, MD   25 mg at 08/12/16 1016  . mirtazapine (REMERON) tablet 7.5 mg  7.5 mg Oral QHS Sital Mody, MD   7.5 mg at 08/11/16 2235  . multivitamin with minerals tablet 1 tablet  1 tablet Oral Daily Adrian Saran, MD   1 tablet at 08/12/16 1016  . nicotine (NICODERM CQ - dosed in mg/24 hours) patch 14 mg  14 mg Transdermal Daily Enid Baas, MD   14 mg at 08/12/16 1018  . ondansetron (ZOFRAN) tablet 4 mg  4 mg Oral Q6H PRN Adrian Saran, MD       Or  . ondansetron (ZOFRAN) injection 4 mg  4 mg Intravenous Q6H PRN Sital Mody, MD      . pantoprazole (PROTONIX) EC tablet 40 mg  40 mg Oral BID Adrian Saran, MD   40 mg at 08/12/16 1016  . senna-docusate (Senokot-S) tablet 1 tablet  1 tablet Oral QHS PRN Adrian Saran, MD      . thiamine (VITAMIN B-1) tablet 100 mg  100 mg Oral Daily Sital Mody, MD   100 mg at 08/12/16 1016   Or  . thiamine (B-1) injection 100 mg  100 mg Intravenous Daily Adrian Saran, MD          Allergies:  Allergies  Allergen Reactions  . Codeine Other (See Comments)    Nasal drip      Past Medical History: Past Medical History:  Diagnosis Date  . Asthma   . COPD  (chronic obstructive pulmonary disease) (HCC)   . Hypertension      Past Surgical History: Past Surgical History:  Procedure Laterality Date  . ESOPHAGOGASTRODUODENOSCOPY (EGD) WITH PROPOFOL N/A 06/21/2015   Procedure: ESOPHAGOGASTRODUODENOSCOPY (EGD) WITH PROPOFOL;  Surgeon: Midge Minium, MD;  Location: [facility] ENDOSCOPY;  Service: Endoscopy;  Laterality: N/A;     Family History: Family History  Problem Relation Age of Onset  . Diabetes    . Hypertension       Social History: Social History   Social History  . Marital status: Legally Separated    Spouse name: N/A  . Number of children: N/A  . Years of education: N/A   Occupational History  . Not on file.   Social History Main Topics  . Smoking status: Current Some Day Smoker    Types: Cigarettes    Last attempt to quit: 06/21/2014  . Smokeless tobacco: Current User    Types: Chew  . Alcohol use 1.2 oz/week    2 Cans of beer per week     Comment: per day  . Drug use: No  . Sexual activity: No   Other Topics Concern  . Not on file   Social History Narrative  . No narrative on file     Review of Systems: Review of Systems  Unable to perform ROS: Dementia    Vital Signs: Blood pressure 110/78, pulse 95, temperature 99.3 F (37.4 C), temperature source Oral, resp. rate 18, height 5\' 5"  (1.651 m), weight 46.7 kg (102 lb 14.4 oz), SpO2 95 %.  Weight trends: Filed Weights   08/07/16 1604 08/07/16 2150  Weight: 43.1 kg (95 lb) 46.7 kg (102 lb 14.4 oz)    Physical Exam: General: NAD, laying in bed. Disheveled.   Head: Normocephalic, atraumatic. Moist oral mucosal membranes  Eyes: Anicteric, PERRL  Neck: Supple, trachea midline  Lungs:  Clear to auscultation  Heart: Regular rate and rhythm  Abdomen:  Soft, nontender,   Extremities: no peripheral edema.  Neurologic: Nonfocal, moving all four extremities  Skin: No lesions        Lab results: Basic Metabolic Panel:  Recent Labs Lab 08/09/16 0515  08/10/16 0338 08/11/16 0704 08/12/16 0318  NA 128* 127* 126* 130*  K 3.5 4.2 3.3* 3.8  CL 97* 89* 90* 94*  CO2 27 32 30 30  GLUCOSE 96 89 89 91  BUN 8 12 6 8   CREATININE 0.56* 0.64 0.56* 0.60*  CALCIUM 7.9* 8.4* 8.3* 8.4*  MG 1.4*  --   --   --   PHOS 2.0*  --   --   --     Liver Function Tests: No results for input(s): AST, ALT, ALKPHOS, BILITOT, PROT, ALBUMIN in the last 168 hours. No results for input(s): LIPASE, AMYLASE in the last 168 hours. No results for input(s): AMMONIA in the last 168 hours.  CBC:  Recent Labs Lab 08/07/16 1609 08/08/16 0458  WBC 8.7 4.6  HGB 12.9* 11.1*  HCT 37.0* 31.8*  MCV 98.2 98.0  PLT 172 148*    Cardiac Enzymes:  Recent Labs Lab 08/07/16 1609  TROPONINI <0.03    BNP: Invalid input(s): POCBNP  CBG: No results for input(s): GLUCAP in the last 168 hours.  Microbiology: Results for orders placed or performed during  the hospital encounter of 06/21/15  MRSA PCR Screening     Status: None   Collection Time: 06/21/15 11:28 AM  Result Value Ref Range Status   MRSA by PCR NEGATIVE NEGATIVE Final    Comment:        The GeneXpert MRSA Assay (FDA approved for NASAL specimens only), is one component of a comprehensive MRSA colonization surveillance program. It is not intended to diagnose MRSA infection nor to guide or monitor treatment for MRSA infections.     Coagulation Studies: No results for input(s): LABPROT, INR in the last 72 hours.  Urinalysis: No results for input(s): COLORURINE, LABSPEC, PHURINE, GLUCOSEU, HGBUR, BILIRUBINUR, KETONESUR, PROTEINUR, UROBILINOGEN, NITRITE, LEUKOCYTESUR in the last 72 hours.  Invalid input(s): APPERANCEUR    Imaging: Dg Chest Port 1 View  Result Date: 08/12/2016 CLINICAL DATA:  61 year old male admitted after syncopal episode and fall getting out of shower. Multiple left side posterior rib fractures with left pneumothorax. EXAM: PORTABLE CHEST 1 VIEW COMPARISON:  08/11/2016 and  earlier. FINDINGS: Portable AP upright view at 0717 hours. Left side chest tube remains in place. No pneumothorax identified. Pulmonary hyperinflation. Stable lung volumes. No pulmonary edema. No definite pleural effusion or acute pulmonary opacity. Stable cardiac size and mediastinal contours. Stable visualized osseous structures. Chronic proximal right humerus deformity. Acute and chronic left rib fractures. IMPRESSION: 1. Stable left chest tube.  No pneumothorax. 2. Pulmonary hyperinflation.  No new cardiopulmonary abnormality. 3.  Calcified aortic atherosclerosis. Electronically Signed   By: Odessa Fleming M.D.   On: 08/12/2016 07:39   Dg Chest Port 1 View  Result Date: 08/11/2016 CLINICAL DATA:  Recent pneumothorax EXAM: PORTABLE CHEST 1 VIEW COMPARISON:  August 10, 2016 FINDINGS: Chest tube remains on the left without appreciable change in position. There is slight soft tissue air in the lateral left hemithorax. There is a skin fold on the left but no evident pneumothorax. There is a small left pleural effusion. There is slight bibasilar atelectasis. There is no edema or consolidation. The heart size and pulmonary vascularity are within normal limits. There is aortic atherosclerosis. There is old rib trauma on the left, unchanged. IMPRESSION: No change in chest tube position. Slight soft tissue air in the left hemithorax laterally but no evident pneumothorax. Small left pleural effusion with mild bibasilar atelectasis. No edema or consolidation. Stable cardiac silhouette. There is aortic atherosclerosis. Electronically Signed   By: Bretta Bang III M.D.   On: 08/11/2016 08:50      Assessment & Plan: Caleb Carpenter is a 61 y.o. white male with COPD/asthma, depression,  Hypertension, ETOH abuse, who was admitted to [facility] on 08/07/2016 for Hyponatremia [E87.1] Hemothorax on left [J94.2] Traumatic pneumothorax, initial encounter [S27.0XXA] Syncope, unspecified syncope type [R55]  1. Hypontremia: with  ongoing EtOH abuse.  - hold IV fluids - Continue fluid restriction .   2. Hypertension: blood pressure at goal.  - lisinopril and metoprolol.   LOS: 5 Terin Dierolf 4/30/20181:59 PM

## 2016-08-12 NOTE — Progress Notes (Signed)
[facility] [place]   PATIENT NAME: Caleb Carpenter    MR#:  [id number]  DATE OF BIRTH:  [birth year]  SUBJECTIVE:   Patient up this morning no acute events overnight still has chest tube in which will be taken out tomorrow  REVIEW OF SYSTEMS:    Review of Systems  Constitutional: Negative.  Negative for chills, fever and malaise/fatigue.  HENT: Negative.  Negative for ear discharge, ear pain, hearing loss, nosebleeds and sore throat.   Eyes: Negative.  Negative for blurred vision and pain.  Respiratory: Negative.  Negative for cough, hemoptysis, shortness of breath and wheezing.   Cardiovascular: Negative.  Negative for chest pain, palpitations and leg swelling.       Left sided rib pain  Gastrointestinal: Negative.  Negative for abdominal pain, blood in stool, diarrhea, nausea and vomiting.  Genitourinary: Negative.  Negative for dysuria.  Musculoskeletal:. Negative for back pain.  Skin: Negative.   Neurological: Negative for dizziness, tremors, speech change, focal weakness, seizures and headaches.  Endo/Heme/Allergies: Negative.  Does not bruise/bleed easily.  Psychiatric/Behavioral: Negative.  Negative for depression, hallucinations and suicidal ideas.     Tolerating Diet: yes      DRUG ALLERGIES:   Allergies  Allergen Reactions  . Codeine Other (See Comments)    Nasal drip    VITALS:  Blood pressure 110/78, pulse 95, temperature 99.3 F (37.4 C), temperature source Oral, resp. rate 18, height 5\' 5"  (1.651 m), weight 46.7 kg (102 lb 14.4 oz), SpO2 95 %.  PHYSICAL EXAMINATION:  Constitutional: Appears chronically ill appearing and very frail  Eyes: no scleral icterus.  Neck: Neck supple. No JVD. No tracheal deviation. CVS: RRR, S1/S2 +, no murmurs, no gallops, no carotid bruit.  Pulmonary: Effort and breath sounds normal, no stridor,No rhonchi, no wheezes, rales. Chest chest tube in place Abdominal: Soft. BS +,  no distension,  tenderness, rebound or guarding.  Musculoskeletal: No edema and no tenderness.  Neuro: No focal deficits no tremors  Skin: Skin is warm and dry. No rash noted. Psychiatric:  Alert and oriented   LABORATORY PANEL:   CBC  Recent Labs Lab 08/08/16 0458  WBC 4.6  HGB 11.1*  HCT 31.8*  PLT 148*   ------------------------------------------------------------------------------------------------------------------  Chemistries   Recent Labs Lab 08/09/16 0515  08/12/16 0318  NA 128*  < > 130*  K 3.5  < > 3.8  CL 97*  < > 94*  CO2 27  < > 30  GLUCOSE 96  < > 91  BUN 8  < > 8  CREATININE 0.56*  < > 0.60*  CALCIUM 7.9*  < > 8.4*  MG 1.4*  --   --   < > = values in this interval not displayed. ------------------------------------------------------------------------------------------------------------------  Cardiac Enzymes  Recent Labs Lab 08/07/16 1609  TROPONINI <0.03   ------------------------------------------------------------------------------------------------------------------  RADIOLOGY:  Dg Chest Port 1 View  Result Date: 08/12/2016 CLINICAL DATA:  61 year old male admitted after syncopal episode and fall getting out of shower. Multiple left side posterior rib fractures with left pneumothorax. EXAM: PORTABLE CHEST 1 VIEW COMPARISON:  08/11/2016 and earlier. FINDINGS: Portable AP upright view at 0717 hours. Left side chest tube remains in place. No pneumothorax identified. Pulmonary hyperinflation. Stable lung volumes. No pulmonary edema. No definite pleural effusion or acute pulmonary opacity. Stable cardiac size and mediastinal contours. Stable visualized osseous structures. Chronic proximal right humerus deformity. Acute and chronic left rib fractures. IMPRESSION: 1. Stable left chest tube.  No pneumothorax. 2. Pulmonary hyperinflation.  No new cardiopulmonary abnormality. 3.  Calcified aortic atherosclerosis. Electronically Signed   By: Odessa Fleming M.D.   On: 08/12/2016  07:39   Dg Chest Port 1 View  Result Date: 08/11/2016 CLINICAL DATA:  Recent pneumothorax EXAM: PORTABLE CHEST 1 VIEW COMPARISON:  August 10, 2016 FINDINGS: Chest tube remains on the left without appreciable change in position. There is slight soft tissue air in the lateral left hemithorax. There is a skin fold on the left but no evident pneumothorax. There is a small left pleural effusion. There is slight bibasilar atelectasis. There is no edema or consolidation. The heart size and pulmonary vascularity are within normal limits. There is aortic atherosclerosis. There is old rib trauma on the left, unchanged. IMPRESSION: No change in chest tube position. Slight soft tissue air in the left hemithorax laterally but no evident pneumothorax. Small left pleural effusion with mild bibasilar atelectasis. No edema or consolidation. Stable cardiac silhouette. There is aortic atherosclerosis. Electronically Signed   By: Bretta Bang III M.D.   On: 08/11/2016 08:50     ASSESSMENT AND PLAN:   61 year old male with past medical history significant for ongoing smoking, tobacco chewing, COPD not on home oxygen, hypertension presents to hospital after a fall and left-sided chest pain and noted to have pneumothorax  1. Left-sided pneumothorax after mechanical fall and rib fractures Appreciate surgical consultation. Chest tube to waterseal as per surgery recommendations Chest x-ray showing Resolution of pneumothorax.  Plan to discontinue chest tube in a.m.  2. Active EtOH withdrawal: Continue CIWA protocol DC sitter  3. Rib fractures with chest pain: Continue when necessary pain medications   4. Hyponatremia from poor by mouth intake with ongoing EtOH abuse Sodium level did decrease after IV fluids. 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 Patient sodium level is 130 this morning without IV fluids. Patient has chronic hyponatremia from EtOH abuse as well.    5. Depression: Continue Remeron and  Wellbutrin   Patient will be able to be discharged tomorrow. Physical therapy consultation pending. Case discussed with surgical services. CODE STATUS: FULL  TOTAL TIME TAKING CARE OF THIS PATIENT: 21 minutes.       Arlo Butt M.D on 08/12/2016 at 11:56 AM  Between 7am to 6pm - Pager - [phone] After 6pm go to [email or web address] - Social research officer, government  [facility]  Office  [phone]  CC: Primary care physician; No PCP Per Patient  Note: This dictation was prepared with Dragon dictation along with smaller phrase technology. Any transcriptional errors that result from this process are unintentional.

## 2016-08-12 NOTE — Progress Notes (Signed)
  Patient ID: RONIN FARB, male   DOB: 1955/05/17, 61 y.o.   MRN: [id number]  HISTORY: Doing better today.  Less back pain with medications.  Not short of breath.  Placed to water seal.     Vitals:   08/11/16 1200 08/11/16 2240  BP: 126/74 110/78  Pulse:  95  Resp:  18  Temp:  99.3 F (37.4 C)     EXAM:    Resp: Lungs are very distant bilaterally.  No respiratory distress, normal effort. Heart:  Regular without murmurs Abd:  Abdomen is soft, non distended and non tender. No masses are palpable.  There is no rebound and no guarding.  Neurological: Alert and oriented to person, place, and time.  Skin: Skin is warm and dry. No rash noted. No diaphoretic. No erythema. No pallor.  Psychiatric: Normal mood and affect. Normal behavior. Judgment and thought content normal.   No air leak with vigorous cough   ASSESSMENT: Traumatic pneumothorax in setting of severe COPD   PLAN:   Leave chest tube to water seal Repeat CXRay in the morning.  If no pneumothorax, will remove chest tube tomorrow and patient will be ready for discharge from that standpoint.     Hulda Marin, MD

## 2016-08-12 NOTE — Progress Notes (Signed)
Nutrition Follow-up  DOCUMENTATION CODES:   Severe malnutrition in context of chronic illness, Underweight  INTERVENTION:  Since assessment patient was changed from regular diet to soft diet. This is a GI diet that is low in fiber and will not help patient with chewing. Will change back to regular diet with comment to provide chopped meats with gravy.  Continue Ensure Enlive po BID, each supplement provides 350 kcal and 20 grams of protein.   Patient was identified to be at risk for refeeding syndrome due to severe malnutrition, EtOH abuse. Per labs on 4/27, he was likely refeeding. With refeeding syndrome, recommend monitoring labs for at least 3 days, as depletion of potassium, phosphorus, magnesium will continue to occur.  Continue daily MVI, thiamine 100 mg, folate 1 mg.  NUTRITION DIAGNOSIS:   Malnutrition (Severe) related to chronic illness (COPD, EtOH abuse) as evidenced by severe depletion of body fat, severe depletion of muscle mass.  Ongoing.  GOAL:   Patient will meet greater than or equal to 90% of their needs, Weight gain  Progressing.  MONITOR:   PO intake, Supplement acceptance, Labs, Weight trends, I & O's  REASON FOR ASSESSMENT:   Other (Comment) (Low BMI)    ASSESSMENT:   61 year old male with PMHx of HTN, asthma, COPD, alcohol abuse, tobacco use disorder who presented after a fall and left-sided chest pain noted to have pneumothorax now s/p chest tube placement.  Spoke with patient at bedside. He reports his appetite is good and he is able to finish most of his meals. Denies any N/V or abdominal pain. Reports he had one BM yesterday. Sitter in room reports patient has trouble with chewing some of the meats that come on trays. Discussed with patient option of changing diet to dysphagia diet, but he reports he does not want to be limited in food options. He is okay with having meats chopped. At time of assessment patient was eating his breakfast (eggs, bagel,  bacon) and drinking an Ensure. He is able to chew bacon.   Meal Completion 4/29: 0% of breakfast, 25% of lunch, meal completion not recorded for dinner yesterday On 4/26-4/28 meal completion was mainly 100%  Medications reviewed and include: Vitamin D 5000 units daily, folic acid 1 mg daily, Ativan, MVI daily, pantoprazole, thiamine 100 mg daily.  Labs reviewed: Sodium 130, Chloride 94, Creatinine 0.6.  Of note, on day after initial assessment Potassium dropped from 4.5 to 3.5, Phosphorus was low at 2, Magnesium was low at 1.4. Patient was likely refeeding. Received repletion of potassium, phosphorus, and magnesium on 4/27. Phosphorus and magnesium have not been checked since.   No weight since admission to trend.  Discussed with RN.  Diet Order:  DIET SOFT Room service appropriate? Yes; Fluid consistency: Thin  Skin:  Reviewed, no issues  Last BM:  08/10/2016 - type 7 per chart  Height:   Ht Readings from Last 1 Encounters:  08/07/16 5\' 5"  (1.651 m)    Weight:   Wt Readings from Last 1 Encounters:  08/07/16 102 lb 14.4 oz (46.7 kg)    Ideal Body Weight:  61.8 kg  BMI:  Body mass index is 17.12 kg/m.  Estimated Nutritional Needs:   Kcal:  1560-1800 (MSJ x 1.3-1.5)  Protein:  70-85 grams (1.5-1.8 grams/kg)  Fluid:  1.5-1.8 L/day  EDUCATION NEEDS:   Education needs addressed  Helane Rima, MS, RD, LDN Pager: [phone] After Hours Pager: [phone]

## 2016-08-13 ENCOUNTER — Inpatient Hospital Stay: Payer: Medicaid Other

## 2016-08-13 LAB — BASIC METABOLIC PANEL
Anion gap: 7 (ref 5–15)
BUN: 12 mg/dL (ref 6–20)
CO2: 30 mmol/L (ref 22–32)
CREATININE: 0.66 mg/dL (ref 0.61–1.24)
Calcium: 8.6 mg/dL — ABNORMAL LOW (ref 8.9–10.3)
Chloride: 93 mmol/L — ABNORMAL LOW (ref 101–111)
Glucose, Bld: 112 mg/dL — ABNORMAL HIGH (ref 65–99)
POTASSIUM: 3.8 mmol/L (ref 3.5–5.1)
Sodium: 130 mmol/L — ABNORMAL LOW (ref 135–145)

## 2016-08-13 LAB — MAGNESIUM: MAGNESIUM: 2 mg/dL (ref 1.7–2.4)

## 2016-08-13 MED ORDER — LISINOPRIL 20 MG PO TABS: 20.0000 mg | ORAL_TABLET | Freq: Every day | ORAL | 0 refills | Status: DC

## 2016-08-13 MED ORDER — ENSURE ENLIVE PO LIQD: 237.0000 mL | Freq: Two times a day (BID) | ORAL | 12 refills | Status: DC

## 2016-08-13 MED ORDER — PANTOPRAZOLE SODIUM 40 MG PO TBEC: 40.0000 mg | DELAYED_RELEASE_TABLET | Freq: Two times a day (BID) | ORAL | 0 refills | Status: DC

## 2016-08-13 MED ORDER — MIRTAZAPINE 7.5 MG PO TABS: 7.5000 mg | ORAL_TABLET | Freq: Every day | ORAL | 0 refills | Status: DC

## 2016-08-13 MED ORDER — NICOTINE 14 MG/24HR TD PT24: 14.0000 mg | MEDICATED_PATCH | Freq: Every day | TRANSDERMAL | 0 refills | Status: DC

## 2016-08-13 NOTE — Discharge Summary (Addendum)
[facility] [place] [facility]   PATIENT NAME: Caleb Carpenter    MR#:  [id number]  DATE OF BIRTH:  1955-11-21  DATE OF ADMISSION:  08/07/2016 ADMITTING PHYSICIAN: Adrian Saran, MD  DATE OF DISCHARGE: 08/13/2016  PRIMARY CARE PHYSICIAN: No PCP Per Patient    ADMISSION DIAGNOSIS:  Hyponatremia [E87.1] Hemothorax on left [J94.2] Traumatic pneumothorax, initial encounter [S27.0XXA] Syncope, unspecified syncope type [R55]  DISCHARGE DIAGNOSIS:  Active Problems:   Pneumothorax   SECONDARY DIAGNOSIS:   Past Medical History:  Diagnosis Date  . Asthma   . COPD (chronic obstructive pulmonary disease) (HCC)   . Hypertension     HOSPITAL COURSE:  61 year old male with past medical history significant for ongoing smoking, tobacco chewing, COPD not on home oxygen, hypertension presents to hospital after a fall and left-sided chest pain and noted to have pneumothorax  1. Left-sided pneumothorax after mechanical fall and rib fractures Patient had chest tube placed on admission by surgery. His latest Chest x-ray showing Resolution of pneumothorax.  Chest tube was discontinued.  2. Active EtOH withdrawal: Patient had EtOH which I'll. He was on CIWA protocol and initially had sitter. He is done with EtOH withdrawal. He is encouraged to stop drinking EtOH.   3. Rib fractures with chest pain: Patient is not complaining of any pain at this time.  4. Hyponatremia from poor by mouth intake with ongoing EtOH abuse Sodium level did decrease after IV fluids. Patient had a component of SIADH. Patient sodium level is stable at 130 this morning without IV fluids. Patient has chronic hyponatremia from EtOH abuse as well.    5. Depression: Continue Remeron and Wellbutrin    DISCHARGE CONDITIONS AND DIET:   Stable for discharge on regular diet  CONSULTS OBTAINED:  Treatment Team:  Leafy Ro, MD Munsoor Cherylann Ratel, MD  DRUG ALLERGIES:   Allergies  Allergen Reactions   . Codeine Other (See Comments)    Nasal drip    DISCHARGE MEDICATIONS:   Current Discharge Medication List    START taking these medications   Details  feeding supplement, ENSURE ENLIVE, (ENSURE ENLIVE) LIQD Take 237 mLs by mouth 2 (two) times daily between meals. Qty: 237 mL, Refills: 12    nicotine (NICODERM CQ - DOSED IN MG/24 HOURS) 14 mg/24hr patch Place 1 patch (14 mg total) onto the skin daily. Qty: 28 patch, Refills: 0      CONTINUE these medications which have CHANGED   Details  lisinopril (PRINIVIL,ZESTRIL) 20 MG tablet Take 1 tablet (20 mg total) by mouth daily. Qty: 30 tablet, Refills: 0    mirtazapine (REMERON) 7.5 MG tablet Take 1 tablet (7.5 mg total) by mouth at bedtime. Qty: 30 tablet, Refills: 0    pantoprazole (PROTONIX) 40 MG tablet Take 1 tablet (40 mg total) by mouth 2 (two) times daily. Qty: 60 tablet, Refills: 0      CONTINUE these medications which have NOT CHANGED   Details  albuterol (PROVENTIL HFA;VENTOLIN HFA) 108 (90 Base) MCG/ACT inhaler Inhale 2 puffs into the lungs 2 (two) times daily.    alendronate (FOSAMAX) 70 MG tablet Take 70 mg by mouth once a week. Take with a full glass of water on an empty stomach.    buPROPion (WELLBUTRIN XL) 150 MG 24 hr tablet Take 150 mg by mouth daily.    Cholecalciferol (D 5000 PO) Take 5,000 Units by mouth daily.    donepezil (ARICEPT) 5 MG tablet Take 5 mg by mouth at bedtime.  fluticasone (FLONASE) 50 MCG/ACT nasal spray Place 2 sprays into both nostrils daily.    !! folic acid (FOLVITE) 1 MG tablet Take 1 mg by mouth 2 (two) times daily.    !! folic acid (FOLVITE) 400 MCG tablet Take 400 mcg by mouth daily.    metoprolol tartrate (LOPRESSOR) 25 MG tablet Take 25 mg by mouth 2 (two) times daily.    thiamine (VITAMIN B-1) 100 MG tablet Take 100 mg by mouth daily.     !! - Potential duplicate medications found. Please discuss with provider.    STOP taking these medications     meloxicam  (MOBIC) 7.5 MG tablet      traMADol (ULTRAM) 50 MG tablet           Today   CHIEF COMPLAINT:  No acute events overnight   VITAL SIGNS:  Blood pressure (!) 91/58, pulse 74, temperature 98.7 F (37.1 C), temperature source Oral, resp. rate 16, height 5\' 5"  (1.651 m), weight 46.7 kg (102 lb 14.4 oz), SpO2 91 %.   REVIEW OF SYSTEMS:  Review of Systems  Constitutional: Negative.  Negative for chills, fever and malaise/fatigue.  HENT: Negative.  Negative for ear discharge, ear pain, hearing loss, nosebleeds and sore throat.   Eyes: Negative.  Negative for blurred vision and pain.  Respiratory: Negative.  Negative for cough, hemoptysis, shortness of breath and wheezing.   Cardiovascular: Negative.  Negative for chest pain, palpitations and leg swelling.  Gastrointestinal: Negative.  Negative for abdominal pain, blood in stool, diarrhea, nausea and vomiting.  Genitourinary: Negative.  Negative for dysuria.  Musculoskeletal: Negative.  Negative for back pain.  Skin: Negative.   Neurological: Negative for dizziness, tremors, speech change, focal weakness, seizures and headaches.  Endo/Heme/Allergies: Negative.  Does not bruise/bleed easily.  Psychiatric/Behavioral: Positive for substance abuse. Negative for depression, hallucinations and suicidal ideas.     PHYSICAL EXAMINATION:  GENERAL:  61 y.o.-year-old patient lying in the bed with no acute distress. Thin and frail NECK:  Supple, no jugular venous distention. No thyroid enlargement, no tenderness.  LUNGS: Normal breath sounds bilaterally, no wheezing, rales,rhonchi  No use of accessory muscles of respiration.  CARDIOVASCULAR: S1, S2 normal. No murmurs, rubs, or gallops.  ABDOMEN: Soft, non-tender, non-distended. Bowel sounds present. No organomegaly or mass.  EXTREMITIES: No pedal edema, cyanosis, or clubbing.  PSYCHIATRIC: The patient is alert and oriented x 3.  SKIN: No obvious rash, lesion, or ulcer.   DATA REVIEW:    CBC  Recent Labs Lab 08/08/16 0458  WBC 4.6  HGB 11.1*  HCT 31.8*  PLT 148*    Chemistries   Recent Labs Lab 08/13/16 0524  NA 130*  K 3.8  CL 93*  CO2 30  GLUCOSE 112*  BUN 12  CREATININE 0.66  CALCIUM 8.6*  MG 2.0    Cardiac Enzymes  Recent Labs Lab 08/07/16 1609  TROPONINI <0.03    Microbiology Results  @MICRORSLT48 @  RADIOLOGY:  Dg Chest Port 1 View  Result Date: 08/12/2016 CLINICAL DATA:  61 year old male admitted after syncopal episode and fall getting out of shower. Multiple left side posterior rib fractures with left pneumothorax. EXAM: PORTABLE CHEST 1 VIEW COMPARISON:  08/11/2016 and earlier. FINDINGS: Portable AP upright view at 0717 hours. Left side chest tube remains in place. No pneumothorax identified. Pulmonary hyperinflation. Stable lung volumes. No pulmonary edema. No definite pleural effusion or acute pulmonary opacity. Stable cardiac size and mediastinal contours. Stable visualized osseous structures. Chronic proximal right humerus  deformity. Acute and chronic left rib fractures. IMPRESSION: 1. Stable left chest tube.  No pneumothorax. 2. Pulmonary hyperinflation.  No new cardiopulmonary abnormality. 3.  Calcified aortic atherosclerosis. Electronically Signed   By: Odessa Fleming M.D.   On: 08/12/2016 07:39      Current Discharge Medication List    START taking these medications   Details  feeding supplement, ENSURE ENLIVE, (ENSURE ENLIVE) LIQD Take 237 mLs by mouth 2 (two) times daily between meals. Qty: 237 mL, Refills: 12    nicotine (NICODERM CQ - DOSED IN MG/24 HOURS) 14 mg/24hr patch Place 1 patch (14 mg total) onto the skin daily. Qty: 28 patch, Refills: 0      CONTINUE these medications which have CHANGED   Details  lisinopril (PRINIVIL,ZESTRIL) 20 MG tablet Take 1 tablet (20 mg total) by mouth daily. Qty: 30 tablet, Refills: 0    mirtazapine (REMERON) 7.5 MG tablet Take 1 tablet (7.5 mg total) by mouth at bedtime. Qty: 30  tablet, Refills: 0    pantoprazole (PROTONIX) 40 MG tablet Take 1 tablet (40 mg total) by mouth 2 (two) times daily. Qty: 60 tablet, Refills: 0      CONTINUE these medications which have NOT CHANGED   Details  albuterol (PROVENTIL HFA;VENTOLIN HFA) 108 (90 Base) MCG/ACT inhaler Inhale 2 puffs into the lungs 2 (two) times daily.    alendronate (FOSAMAX) 70 MG tablet Take 70 mg by mouth once a week. Take with a full glass of water on an empty stomach.    buPROPion (WELLBUTRIN XL) 150 MG 24 hr tablet Take 150 mg by mouth daily.    Cholecalciferol (D 5000 PO) Take 5,000 Units by mouth daily.    donepezil (ARICEPT) 5 MG tablet Take 5 mg by mouth at bedtime.    fluticasone (FLONASE) 50 MCG/ACT nasal spray Place 2 sprays into both nostrils daily.    !! folic acid (FOLVITE) 1 MG tablet Take 1 mg by mouth 2 (two) times daily.    !! folic acid (FOLVITE) 400 MCG tablet Take 400 mcg by mouth daily.    metoprolol tartrate (LOPRESSOR) 25 MG tablet Take 25 mg by mouth 2 (two) times daily.    thiamine (VITAMIN B-1) 100 MG tablet Take 100 mg by mouth daily.     !! - Potential duplicate medications found. Please discuss with provider.    STOP taking these medications     meloxicam (MOBIC) 7.5 MG tablet      traMADol (ULTRAM) 50 MG tablet           D/w family   Management plans discussed with the patient and he is in agreement. Stable for discharge home  Patient should follow up with pcp  CODE STATUS:     Code Status Orders        Start     Ordered   08/07/16 2145  Full code  Continuous     08/07/16 2145    Code Status History    Date Active Date Inactive Code Status Order ID Comments User Context   09/03/2015  1:16 AM 09/04/2015  7:49 PM Full Code [id number]  Oralia Manis, MD Inpatient   06/21/2015  9:01 AM 06/23/2015  8:05 PM Full Code [id number]  Katha Hamming, MD ED      TOTAL TIME TAKING CARE OF THIS PATIENT: 37 minutes.    Note: This dictation was prepared with  Dragon dictation along with smaller phrase technology. Any transcriptional errors that result from this process are unintentional.  Terryn Redner M.D on 08/13/2016 at 10:04 AM  Between 7am to 6pm - Pager - [phone] After 6pm go to [email or web address] - Social research officer, government  [facility]  Office  [phone]  CC: Primary care physician; No PCP Per Patient

## 2016-08-13 NOTE — Care Management (Signed)
Patient admitted for [facility] for pneumothorax status post fall. Patient with history of ETOH abuse.  Patient to have chest tube removed today, and repeat chest xray.  If stable patient to discharge today.  Initially patient wanted to discharge to nursing facility for convalescent care. Patient and his brother are now both in agreement for the patient to discharge home with his brother.  PT has assessed patient and recommended home health PT.  Patient has Medicaid and does not qualify for home health PT services.  Patient and brother are aware of this patient was provided with information and outpatient referral form to [facility] clinic. RW was delivered to room by [facility] with [facility].  No RNCM needs identified.  RNCM signing off.

## 2016-08-13 NOTE — Clinical Social Work Note (Signed)
Patient's nurse's aid informed CSW that patient was able to ambulate independently in his room and go to the bathroom and return to the bed without assistance. Patient has improved since he walked 3 feet with PT. RN CM has made arrangements for patient to return home. York Spaniel MSW,LCSW [phone]

## 2016-08-13 NOTE — Progress Notes (Signed)
08/13/2016  BP 108/63 (BP Location: Left Arm)   Pulse 71   Temp 98.4 F (36.9 C) (Oral)   Resp 16   Ht 5\' 5"  (1.651 m)   Wt 46.7 kg (102 lb 14.4 oz)   SpO2 97%   BMI 17.12 kg/m  Patient discharged per MD orders. Discharge instructions reviewed with patient and patient verbalized understanding. IV removed per policy. Prescriptions discussed and given to patient. Discharged via wheelchair escorted by auxilary.  Ron Parker, RN

## 2016-08-13 NOTE — Clinical Social Work Note (Signed)
Clinical Social Work Assessment  Patient Details  Name: Caleb Carpenter MRN: [id number] Date of Birth: 1955-09-05  Date of referral:  08/13/16               Reason for consult:  Facility Placement                Permission sought to share information with:  Oceanographer granted to share information::  Yes, Verbal Permission Granted  Name::        Agency::     Relationship::     Contact Information:     Housing/Transportation Living arrangements for the past 2 months:  Single Family Home Source of Information:  Patient Patient Interpreter Needed:  None Criminal Activity/Legal Involvement Pertinent to Current Situation/Hospitalization:  No - Comment as needed Significant Relationships:  Other Family Members Lives with:  Self Do you feel safe going back to the place where you live?  Yes Need for family participation in patient care:  Yes (Comment)  Care giving concerns:  Patient resides alone.   Social Worker assessment / plan:  CSW informed by patient's nurse that patient is not ambulating and that patient's family arrived last evening and expressed concern about him going home. CSW reviewed patient's electronic medical record and discovered that PT had evaluated patient and recommended home health and patient ambulated 3 feet. CSW informed by nursing that patient also has ETOH abuse and that he was intoxicated when he fell in his shower which is what led to his hospital admission.  CSW went to speak with patient this morning. Patient was alert and oriented X4. Patient confirmed that he was drinking alcohol when he fell in his shower. Patient states that he is not that interested in alcohol rehab programs at this time but is interested in going to a nursing facility for strengthening and convalescent care. Patient reports to CSW that he lives alone and that he has not been able to walk well since he was admitted. Patient reports that he was ambulating  independently prior to this hospital admission. CSW explained the placement process and how his medicaid would function in that process. Patient stated that he will not have anyone to assist him at home and that he would be willing to stay 30 days in a facility, to give up his monthly check and to go out of county if he needed to. CSW provided emotional support and encouragement.  CSW has initiated a bed search.  Employment status:  Disabled (Comment on whether or not currently receiving Disability) Insurance information:  Medicaid [place] PT Recommendations:  Home with Home Health Information / Referral to community resources:     Patient/Family's Response to care:  Patient expressed appreciation for CSW assistance.  Patient/Family's Understanding of and Emotional Response to Diagnosis, Current Treatment, and Prognosis:  Patient states he would prefer to go home but understands his current limitations and is in agreement for a bed search.  Emotional Assessment Appearance:  Appears older than stated age Attitude/Demeanor/Rapport:   (cooperative) Affect (typically observed):  Accepting, Adaptable, Calm Orientation:  Oriented to Self, Oriented to Place, Oriented to  Time, Oriented to Situation Alcohol / Substance use:  Not Applicable Psych involvement (Current and /or in the community):  No (Comment)  Discharge Needs  Concerns to be addressed:  Care Coordination Readmission within the last 30 days:  No Current discharge risk:  Substance Abuse Barriers to Discharge:  No Barriers Identified   York Spaniel, LCSW 08/13/2016, 11:34  AM

## 2016-08-13 NOTE — Progress Notes (Signed)
SURGICAL PROGRESS NOTE (cpt [phone])  Hospital Day(s): 6.   Post op day(s):  Marland Kitchen   Interval History: Patient seen and examined, no acute events or new complaints overnight. Patient reports mild pain around Left chest thoracostomy tube, denies CP, SOB, fever/chills, palpitations, or N/V.  Review of Systems:  Constitutional: denies fever, chills  HEENT: denies cough or congestion  Respiratory: denies any shortness of breath  Cardiovascular: denies chest pain or palpitations  Gastrointestinal: denies abdominal pain, N/V, or diarrhea Genitourinary: denies burning with urination or urinary frequency Musculoskeletal: denies pain, decreased motor or sensation Integumentary: denies any other rashes or skin discolorations except Left chest thoracostomy tube as per HPI Neurological: denies HA or vision/hearing changes   Vital signs in last 24 hours: [min-max] current  Temp:  [98.7 F (37.1 C)-98.9 F (37.2 C)] 98.7 F (37.1 C) (05/01 0446) Pulse Rate:  [73-79] 74 (05/01 0446) Resp:  [16] 16 (05/01 0446) BP: (91-103)/(58-62) 91/58 (05/01 0446) SpO2:  [91 %-93 %] 91 % (05/01 0446)     Height: 5\' 5"  (165.1 cm) Weight: 102 lb 14.4 oz (46.7 kg) BMI (Calculated): 17.2   Intake/Output this shift:  No intake/output data recorded.   Intake/Output last 2 shifts:  @IOLAST2SHIFTS @   Physical Exam:  Constitutional: alert, cooperative and no distress  HENT: normocephalic without obvious abnormality  Eyes: PERRL, EOM's grossly intact and symmetric  Neuro: CN II - XII grossly intact and symmetric without deficit  Respiratory: Left chest thoracostomy tube well-secured without surrounding erythema or drainage + mild surrounding tenderness, breathing non-labored at rest, no air leak Cardiovascular: regular rate and sinus rhythm  Gastrointestinal: soft, non-tender, and non-distended  Musculoskeletal: UE and LE FROM, no edema or wounds, motor and sensation grossly intact, NT   Labs:  CBC:  Lab  Results  Component Value Date   WBC 4.6 08/08/2016   RBC 3.24 (L) 08/08/2016     Imaging studies:  Chest x-ray (08/13/2016) - personally reviewed, compared to prior imaging study, and discussed with patient Left pleural catheter is unchanged in position. No pneumothorax visualized. Multiple left rib fractures again noted. Changes of COPD are again demonstrated. No evidence of pulmonary infiltrate or pleural effusion. Stable scarring in posterior left lung base.  Chest x-ray (08/12/2016) Left side chest tube remains in place. No pneumothorax identified. Pulmonary hyperinflation. Stable lung volumes. No pulmonary edema. No definite pleural effusion or acute pulmonary opacity. Stable cardiac size and mediastinal contours. Stable visualized osseous structures. Chronic proximal right humerus deformity. Acute and chronic left rib fractures.  Assessment/Plan: (ICD-10's: S77XXD) 61 y.o. male with trauma-associated Left-sided pneumothorax without recurrence on water seal s/p fall from standing in shower, complicated by pertinent comorbidities including chronic alcohol dependence/abuse, COPD, and HTN.              - discussed with Dr. Thelma Barge, recommendations appreciated              - Left chest thoracostomy tube removed uneventfully, will follow-up repeat chest x-ray in 3-4 hours              - DT prophylaxis and medical management as per primary medical team  - okay with discharge as per medical team if no recurrent PTX  - continued abstinence from alcohol encouraged  All of the above findings and recommendations were discussed with the patient, patient's RN, and the medical team, and all of patient's questions were answered to his expressed satisfaction.  Thank you for the opportunity to participate in this patient's care.  --  Marilynne Drivers. Rosana Hoes, MD, [place]: [facility] - Partnering for exceptional care. Office: [phone]

## 2016-08-13 NOTE — Progress Notes (Signed)
[facility]  ROUNDING NOTE   Subjective:   More awake and cooperative.  Na 130  Objective:  Vital signs in last 24 hours:  Temp:  [98.7 F (37.1 C)-98.9 F (37.2 C)] 98.7 F (37.1 C) (05/01 0446) Pulse Rate:  [73-79] 74 (05/01 0446) Resp:  [16] 16 (05/01 0446) BP: (91-103)/(58-62) 91/58 (05/01 0446) SpO2:  [91 %-93 %] 91 % (05/01 0446)  Weight change:  Filed Weights   08/07/16 1604 08/07/16 2150  Weight: 43.1 kg (95 lb) 46.7 kg (102 lb 14.4 oz)    Intake/Output: I/O last 3 completed shifts: In: 480 [P.O.:480] Out: 1230 [Urine:1200; Chest Tube:30]   Intake/Output this shift:  Total I/O In: -  Out: 425 [Urine:425]  Physical Exam: General: NAD, cachectic  Head: Normocephalic, atraumatic. Moist oral mucosal membranes  Eyes: Anicteric, PERRL  Neck: Supple, trachea midline  Lungs:  Clear to auscultation  Heart: Regular rate and rhythm  Abdomen:  Soft, nontender,   Extremities: no peripheral edema.  Neurologic: Nonfocal, moving all four extremities  Skin: No lesions       Basic Metabolic Panel:  Recent Labs Lab 08/09/16 0515 08/10/16 0338 08/11/16 0704 08/12/16 0318 08/13/16 0524  NA 128* 127* 126* 130* 130*  K 3.5 4.2 3.3* 3.8 3.8  CL 97* 89* 90* 94* 93*  CO2 27 32 30 30 30   GLUCOSE 96 89 89 91 112*  BUN 8 12 6 8 12   CREATININE 0.56* 0.64 0.56* 0.60* 0.66  CALCIUM 7.9* 8.4* 8.3* 8.4* 8.6*  MG 1.4*  --   --  1.5* 2.0  PHOS 2.0*  --   --  3.9  --     Liver Function Tests: No results for input(s): AST, ALT, ALKPHOS, BILITOT, PROT, ALBUMIN in the last 168 hours. No results for input(s): LIPASE, AMYLASE in the last 168 hours. No results for input(s): AMMONIA in the last 168 hours.  CBC:  Recent Labs Lab 08/07/16 1609 08/08/16 0458  WBC 8.7 4.6  HGB 12.9* 11.1*  HCT 37.0* 31.8*  MCV 98.2 98.0  PLT 172 148*    Cardiac Enzymes:  Recent Labs Lab 08/07/16 1609  TROPONINI <0.03    BNP: Invalid input(s): POCBNP  CBG: No  results for input(s): GLUCAP in the last 168 hours.  Microbiology: Results for orders placed or performed during the hospital encounter of 06/21/15  MRSA PCR Screening     Status: None   Collection Time: 06/21/15 11:28 AM  Result Value Ref Range Status   MRSA by PCR NEGATIVE NEGATIVE Final    Comment:        The GeneXpert MRSA Assay (FDA approved for NASAL specimens only), is one component of a comprehensive MRSA colonization surveillance program. It is not intended to diagnose MRSA infection nor to guide or monitor treatment for MRSA infections.     Coagulation Studies: No results for input(s): LABPROT, INR in the last 72 hours.  Urinalysis: No results for input(s): COLORURINE, LABSPEC, PHURINE, GLUCOSEU, HGBUR, BILIRUBINUR, KETONESUR, PROTEINUR, UROBILINOGEN, NITRITE, LEUKOCYTESUR in the last 72 hours.  Invalid input(s): APPERANCEUR    Imaging: Dg Chest 2 View  Result Date: 08/13/2016 CLINICAL DATA:  Followup traumatic left pneumothorax.  COPD. EXAM: CHEST  2 VIEW COMPARISON:  08/12/2016 FINDINGS: Left pleural catheter is unchanged in position. No pneumothorax visualized. Multiple left rib fractures again noted. Changes of COPD are again demonstrated. No evidence of pulmonary infiltrate or pleural effusion. Stable scarring in posterior left lung base. IMPRESSION: Left pleural catheter remains in  place.  No pneumothorax visualized. COPD. Electronically Signed   By: Myles Rosenthal M.D.   On: 08/13/2016 10:04   Dg Chest Port 1 View  Result Date: 08/12/2016 CLINICAL DATA:  61 year old male admitted after syncopal episode and fall getting out of shower. Multiple left side posterior rib fractures with left pneumothorax. EXAM: PORTABLE CHEST 1 VIEW COMPARISON:  08/11/2016 and earlier. FINDINGS: Portable AP upright view at 0717 hours. Left side chest tube remains in place. No pneumothorax identified. Pulmonary hyperinflation. Stable lung volumes. No pulmonary edema. No definite pleural  effusion or acute pulmonary opacity. Stable cardiac size and mediastinal contours. Stable visualized osseous structures. Chronic proximal right humerus deformity. Acute and chronic left rib fractures. IMPRESSION: 1. Stable left chest tube.  No pneumothorax. 2. Pulmonary hyperinflation.  No new cardiopulmonary abnormality. 3.  Calcified aortic atherosclerosis. Electronically Signed   By: Odessa Fleming M.D.   On: 08/12/2016 07:39     Medications:    . benzonatate  100 mg Oral TID  . buPROPion  150 mg Oral Daily  . cholecalciferol  5,000 Units Oral Daily  . donepezil  5 mg Oral QHS  . enoxaparin (LOVENOX) injection  40 mg Subcutaneous Q24H  . feeding supplement (ENSURE ENLIVE)  237 mL Oral BID BM  . folic acid  1 mg Oral Daily  . lisinopril  20 mg Oral Daily  . LORazepam  1 mg Intravenous Q6H  . metoprolol tartrate  25 mg Oral BID  . mirtazapine  7.5 mg Oral QHS  . multivitamin with minerals  1 tablet Oral Daily  . nicotine  14 mg Transdermal Daily  . pantoprazole  40 mg Oral BID  . thiamine  100 mg Oral Daily   Or  . thiamine  100 mg Intravenous Daily   acetaminophen **OR** acetaminophen, albuterol, bisacodyl, fluticasone, hydrALAZINE, HYDROcodone-acetaminophen, iopamidol, ondansetron **OR** ondansetron (ZOFRAN) IV, senna-docusate  Assessment/ Plan:  [name] is a 61 y.o. white male with COPD/asthma, depression,  Hypertension, ETOH abuse, who was admitted to [facility] on 08/07/2016 for Hyponatremia   1. Hypontremia: with ongoing EtOH abuse.  - hold IV fluids - Continue fluid restriction .   2. Hypertension: blood pressure at goal.  - lisinopril and metoprolol.    LOS: 6 Caleb Carpenter 5/1/201811:32 AM

## 2016-08-13 NOTE — Progress Notes (Signed)
MEDICATION RELATED CONSULT NOTE   Pharmacy Consult for Electrolyte Monitoring Indication: hypomagnesemia, hypokalemia  Allergies  Allergen Reactions  . Codeine Other (See Comments)    Nasal drip    Patient Measurements: Height: 5\' 5"  (165.1 cm) Weight: 102 lb 14.4 oz (46.7 kg) IBW/kg (Calculated) : 61.5 Adjusted Body Weight:    Vital Signs: Temp: 98.7 F (37.1 C) (05/01 0446) Temp Source: Oral (05/01 0446) BP: 91/58 (05/01 0446) Pulse Rate: 74 (05/01 0446) Intake/Output from previous day: 04/30 0701 - 05/01 0700 In: 480 [P.O.:480] Out: 1230 [Urine:1200; Chest Tube:30] Intake/Output from this shift: No intake/output data recorded.  Labs:  Recent Labs  08/11/16 0704 08/12/16 0318 08/13/16 0524  CREATININE 0.56* 0.60* 0.66  MG  --  1.5* 2.0  PHOS  --  3.9  --    Lab Results  Component Value Date   K 3.8 08/13/2016   Estimated Creatinine Clearance: 63.2 mL/min (by C-G formula based on SCr of 0.66 mg/dL).   Microbiology: No results found for this or any previous visit (from the past 720 hour(s)).  Medical History: Past Medical History:  Diagnosis Date  . Asthma   . COPD (chronic obstructive pulmonary disease) (HCC)   . Hypertension      Assessment: 61 yo M with Pneumothorax s/p fall, ETOH.  K 3.8,  Mag 2.0,  Scr 0.66   4/30:  Phos 3.9.  Plan:  Will f/u labs in am.   Jewett Mcgann A 08/13/2016,7:39 AM

## 2016-08-13 NOTE — Progress Notes (Signed)
[facility] [place] [facility]   PATIENT NAME: Caleb Carpenter    MR#:  [id number]  DATE OF BIRTH:  1955-10-25  SUBJECTIVE:  No acute events overnight  Not complaining of any pain  REVIEW OF SYSTEMS:    Review of Systems  Constitutional: Negative.  Negative for chills, fever and malaise/fatigue.  HENT: Negative.  Negative for ear discharge, ear pain, hearing loss, nosebleeds and sore throat.   Eyes: Negative.  Negative for blurred vision and pain.  Respiratory: Negative.  Negative for cough, hemoptysis, shortness of breath and wheezing.   Cardiovascular: Negative.  Negative for chest pain, palpitations and leg swelling.   Gastrointestinal: Negative.  Negative for abdominal pain, blood in stool, diarrhea, nausea and vomiting.  Genitourinary: Negative.  Negative for dysuria.  Musculoskeletal:. Negative for back pain.  Skin: Negative.   Neurological: Negative for dizziness, tremors, speech change, focal weakness, seizures and headaches.  Endo/Heme/Allergies: Negative.  Does not bruise/bleed easily.  Psychiatric/Behavioral: Negative.  Negative for depression, hallucinations and suicidal ideas.     Tolerating Diet: yes      DRUG ALLERGIES:   Allergies  Allergen Reactions  . Codeine Other (See Comments)    Nasal drip    VITALS:  Blood pressure (!) 91/58, pulse 74, temperature 98.7 F (37.1 C), temperature source Oral, resp. rate 16, height 5\' 5"  (1.651 m), weight 46.7 kg (102 lb 14.4 oz), SpO2 91 %.  PHYSICAL EXAMINATION:  Constitutional: Appears chronically ill appearing and very frail  Eyes: no scleral icterus.  Neck: Neck supple. No JVD. No tracheal deviation. CVS: RRR, S1/S2 +, no murmurs, no gallops, no carotid bruit.  Pulmonary: Effort and breath sounds normal, no stridor,No rhonchi, no wheezes, rales. Chest chest tube in place Abdominal: Soft. BS +,  no distension, tenderness, rebound or guarding.  Musculoskeletal: No edema and no tenderness.   Neuro: No focal deficits no tremors  Skin: Skin is warm and dry. No rash noted. Psychiatric:  Alert and oriented   LABORATORY PANEL:   CBC  Recent Labs Lab 08/08/16 0458  WBC 4.6  HGB 11.1*  HCT 31.8*  PLT 148*   ------------------------------------------------------------------------------------------------------------------  Chemistries   Recent Labs Lab 08/13/16 0524  NA 130*  K 3.8  CL 93*  CO2 30  GLUCOSE 112*  BUN 12  CREATININE 0.66  CALCIUM 8.6*  MG 2.0   ------------------------------------------------------------------------------------------------------------------  Cardiac Enzymes  Recent Labs Lab 08/07/16 1609  TROPONINI <0.03   ------------------------------------------------------------------------------------------------------------------  RADIOLOGY:  Dg Chest 2 View  Result Date: 08/13/2016 CLINICAL DATA:  Followup traumatic left pneumothorax.  COPD. EXAM: CHEST  2 VIEW COMPARISON:  08/12/2016 FINDINGS: Left pleural catheter is unchanged in position. No pneumothorax visualized. Multiple left rib fractures again noted. Changes of COPD are again demonstrated. No evidence of pulmonary infiltrate or pleural effusion. Stable scarring in posterior left lung base. IMPRESSION: Left pleural catheter remains in place.  No pneumothorax visualized. COPD. Electronically Signed   By: Myles Rosenthal M.D.   On: 08/13/2016 10:04   Dg Chest Port 1 View  Result Date: 08/12/2016 CLINICAL DATA:  61 year old male admitted after syncopal episode and fall getting out of shower. Multiple left side posterior rib fractures with left pneumothorax. EXAM: PORTABLE CHEST 1 VIEW COMPARISON:  08/11/2016 and earlier. FINDINGS: Portable AP upright view at 0717 hours. Left side chest tube remains in place. No pneumothorax identified. Pulmonary hyperinflation. Stable lung volumes. No pulmonary edema. No definite pleural effusion or acute pulmonary opacity. Stable cardiac size and  mediastinal contours. Stable visualized osseous structures. Chronic proximal right humerus deformity. Acute and chronic left rib fractures. IMPRESSION: 1. Stable left chest tube.  No pneumothorax. 2. Pulmonary hyperinflation.  No new cardiopulmonary abnormality. 3.  Calcified aortic atherosclerosis. Electronically Signed   By: Odessa Fleming M.D.   On: 08/12/2016 07:39     ASSESSMENT AND PLAN:   61 year old male with past medical history significant for ongoing smoking, tobacco chewing, COPD not on home oxygen, hypertension presents to hospital after a fall and left-sided chest pain and noted to have pneumothorax  1. Left-sided pneumothorax after mechanical fall and rib fractures Appreciate surgical consultation. Chest tube to waterseal as per surgery recommendations Chest x-ray showing Resolution of pneumothorax.  Plan to discontinue chest tube Today with repeat chest x-ray afterwards.  2. Active EtOH withdrawal: Patient has gone through withdrawal.  3. Rib fractures with chest pain: Continue when necessary pain medications   4. Hyponatremia from poor by mouth intake with ongoing EtOH abuse Sodium level did decrease after IV fluids. 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 Patient sodium level is 130 this morning without IV fluids. Patient has chronic hyponatremia from EtOH abuse as well.    5. Depression: Continue Remeron and Wellbutrin   As per clinical social worker patient will need to go to long-term facility.  CODE STATUS: FULL  TOTAL TIME TAKING CARE OF THIS PATIENT: 21 minutes.       Jaylynn Siefert M.D on 08/13/2016 at 12:26 PM  Between 7am to 6pm - Pager - [phone] After 6pm go to [email or web address] - Social research officer, government  [facility]  Office  [phone]  CC: Primary care physician; No PCP Per Patient  Note: This dictation was prepared with Dragon dictation along with smaller phrase technology. Any transcriptional errors that result from this process  are unintentional.

## 2016-08-13 NOTE — NC FL2 (Signed)
[place] MEDICAID FL2 LEVEL OF CARE SCREENING TOOL     IDENTIFICATION  Patient Name: Caleb Carpenter Birthdate: 10-15-1955 Sex: male Admission Date (Current Location): 08/07/2016  [facility] and [place] Number:  Chiropodist and Address:  [facility], [address], [place], [place] [address]      Provider Number: [id number]  Attending Physician Name and Address:  Adrian Saran, MD  Relative Name and Phone Number:       Current Level of Care: Hospital Recommended Level of Care: Skilled Nursing Facility Prior Approval Number:    Date Approved/Denied:   PASRR Number:    Discharge Plan: SNF    Current Diagnoses: Patient Active Problem List   Diagnosis Date Noted  . Pneumothorax 08/07/2016  . General weakness 09/03/2015  . Generalized weakness 09/02/2015  . Hyponatremia 09/02/2015  . HTN (hypertension) 09/02/2015  . GERD (gastroesophageal reflux disease) 09/02/2015  . Acute posthemorrhagic anemia 06/23/2015  . Hypomagnesemia 06/23/2015  . Leukocytosis 06/23/2015  . Alcohol abuse 06/23/2015  . Thrombocytopenia (HCC) 06/23/2015  . Upper GI bleed 06/21/2015  . Reflux esophagitis   . Gastritis   . Duodenal ulceration     Orientation RESPIRATION BLADDER Height & Weight     Self, Time, Situation, Place  Normal Incontinent Weight: 102 lb 14.4 oz (46.7 kg) Height:  5\' 5"  (165.1 cm)  BEHAVIORAL SYMPTOMS/MOOD NEUROLOGICAL BOWEL NUTRITION STATUS   (none)  (none) Continent Diet (regular)  AMBULATORY STATUS COMMUNICATION OF NEEDS Skin   Extensive Assist Verbally Normal                       Personal Care Assistance Level of Assistance  Bathing, Dressing Bathing Assistance: Limited assistance   Dressing Assistance: Limited assistance     Functional Limitations Info   (no issues)          SPECIAL CARE FACTORS FREQUENCY  PT (By licensed PT)                    Contractures Contractures Info: Not present    Additional  Factors Info  Code Status, Allergies Code Status Info: full Allergies Info: codeine           Current Medications (08/13/2016):  This is the current hospital active medication list Current Facility-Administered Medications  Medication Dose Route Frequency Provider Last Rate Last Dose  . acetaminophen (TYLENOL) tablet 650 mg  650 mg Oral Q6H PRN Adrian Saran, MD   650 mg at 08/08/16 2202   Or  . acetaminophen (TYLENOL) suppository 650 mg  650 mg Rectal Q6H PRN Adrian Saran, MD      . albuterol (PROVENTIL) (2.5 MG/3ML) 0.083% nebulizer solution 2.5 mg  2.5 mg Inhalation BID PRN Adrian Saran, MD   2.5 mg at 08/09/16 2038  . benzonatate (TESSALON) capsule 100 mg  100 mg Oral TID Enid Baas, MD   100 mg at 08/13/16 1015  . bisacodyl (DULCOLAX) EC tablet 5 mg  5 mg Oral Daily PRN Adrian Saran, MD      . buPROPion (WELLBUTRIN XL) 24 hr tablet 150 mg  150 mg Oral Daily Adrian Saran, MD   150 mg at 08/13/16 1015  . cholecalciferol (VITAMIN D) tablet 5,000 Units  5,000 Units Oral Daily Adrian Saran, MD   5,000 Units at 08/13/16 1015  . donepezil (ARICEPT) tablet 5 mg  5 mg Oral QHS Adrian Saran, MD   5 mg at 08/12/16 2057  . enoxaparin (LOVENOX) injection  40 mg  40 mg Subcutaneous Q24H Adrian Saran, MD   40 mg at 08/12/16 2057  . feeding supplement (ENSURE ENLIVE) (ENSURE ENLIVE) liquid 237 mL  237 mL Oral BID BM Enid Baas, MD   237 mL at 08/13/16 1015  . fluticasone (FLONASE) 50 MCG/ACT nasal spray 2 spray  2 spray Each Nare Daily PRN Adrian Saran, MD      . folic acid (FOLVITE) tablet 1 mg  1 mg Oral Daily Sital Mody, MD   1 mg at 08/13/16 1015  . hydrALAZINE (APRESOLINE) injection 10 mg  10 mg Intravenous Q6H PRN Adrian Saran, MD      . HYDROcodone-acetaminophen (NORCO/VICODIN) 5-325 MG per tablet 1-2 tablet  1-2 tablet Oral Q4H PRN Adrian Saran, MD   2 tablet at 08/12/16 0839  . iopamidol (ISOVUE-370) 76 % injection 80 mL  80 mL Intravenous Once PRN Arnaldo Natal, MD      . lisinopril (PRINIVIL,ZESTRIL)  tablet 20 mg  20 mg Oral Daily Adrian Saran, MD   20 mg at 08/13/16 1015  . LORazepam (ATIVAN) injection 1 mg  1 mg Intravenous Q6H Pavan Pyreddy, MD   1 mg at 08/11/16 1952  . metoprolol tartrate (LOPRESSOR) tablet 25 mg  25 mg Oral BID Adrian Saran, MD   25 mg at 08/13/16 1015  . mirtazapine (REMERON) tablet 7.5 mg  7.5 mg Oral QHS Sital Mody, MD   7.5 mg at 08/12/16 2057  . multivitamin with minerals tablet 1 tablet  1 tablet Oral Daily Adrian Saran, MD   1 tablet at 08/13/16 1015  . nicotine (NICODERM CQ - dosed in mg/24 hours) patch 14 mg  14 mg Transdermal Daily Enid Baas, MD   14 mg at 08/13/16 1000  . ondansetron (ZOFRAN) tablet 4 mg  4 mg Oral Q6H PRN Adrian Saran, MD       Or  . ondansetron (ZOFRAN) injection 4 mg  4 mg Intravenous Q6H PRN Sital Mody, MD      . pantoprazole (PROTONIX) EC tablet 40 mg  40 mg Oral BID Adrian Saran, MD   40 mg at 08/13/16 1015  . senna-docusate (Senokot-S) tablet 1 tablet  1 tablet Oral QHS PRN Adrian Saran, MD      . thiamine (VITAMIN B-1) tablet 100 mg  100 mg Oral Daily Sital Mody, MD   100 mg at 08/13/16 1015   Or  . thiamine (B-1) injection 100 mg  100 mg Intravenous Daily Adrian Saran, MD         Discharge Medications: Please see discharge summary for a list of discharge medications.  Relevant Imaging Results:  Relevant Lab Results:   Additional Information ss: [id number]  York Spaniel, LCSW

## 2016-08-27 ENCOUNTER — Encounter: Payer: Self-pay | Admitting: Emergency Medicine

## 2016-08-27 ENCOUNTER — Emergency Department: Payer: Medicaid Other

## 2016-08-27 ENCOUNTER — Observation Stay
Admission: EM | Admit: 2016-08-27 | Discharge: 2016-08-28 | Disposition: A | Discharge status: Home / Self Care | Payer: Medicaid Other | Attending: Internal Medicine | Admitting: Internal Medicine

## 2016-08-27 DIAGNOSIS — I951 Orthostatic hypotension: Secondary | ICD-10-CM | POA: Diagnosis not present

## 2016-08-27 DIAGNOSIS — R52 Pain, unspecified: Secondary | ICD-10-CM

## 2016-08-27 DIAGNOSIS — R531 Weakness: Principal | ICD-10-CM | POA: Insufficient documentation

## 2016-08-27 DIAGNOSIS — R42 Dizziness and giddiness: Secondary | ICD-10-CM | POA: Diagnosis present

## 2016-08-27 DIAGNOSIS — E860 Dehydration: Secondary | ICD-10-CM | POA: Diagnosis not present

## 2016-08-27 DIAGNOSIS — J439 Emphysema, unspecified: Secondary | ICD-10-CM | POA: Insufficient documentation

## 2016-08-27 DIAGNOSIS — E871 Hypo-osmolality and hyponatremia: Secondary | ICD-10-CM | POA: Insufficient documentation

## 2016-08-27 DIAGNOSIS — Z79899 Other long term (current) drug therapy: Secondary | ICD-10-CM | POA: Diagnosis not present

## 2016-08-27 DIAGNOSIS — Z7983 Long term (current) use of bisphosphonates: Secondary | ICD-10-CM | POA: Diagnosis not present

## 2016-08-27 DIAGNOSIS — Z7951 Long term (current) use of inhaled steroids: Secondary | ICD-10-CM | POA: Insufficient documentation

## 2016-08-27 DIAGNOSIS — K219 Gastro-esophageal reflux disease without esophagitis: Secondary | ICD-10-CM | POA: Diagnosis not present

## 2016-08-27 DIAGNOSIS — F17210 Nicotine dependence, cigarettes, uncomplicated: Secondary | ICD-10-CM | POA: Diagnosis not present

## 2016-08-27 DIAGNOSIS — E43 Unspecified severe protein-calorie malnutrition: Secondary | ICD-10-CM | POA: Insufficient documentation

## 2016-08-27 DIAGNOSIS — I10 Essential (primary) hypertension: Secondary | ICD-10-CM | POA: Insufficient documentation

## 2016-08-27 DIAGNOSIS — M79605 Pain in left leg: Secondary | ICD-10-CM

## 2016-08-27 LAB — BASIC METABOLIC PANEL
Anion gap: 7 (ref 5–15)
CO2: 23 mmol/L (ref 22–32)
Calcium: 8.8 mg/dL — ABNORMAL LOW (ref 8.9–10.3)
Chloride: 99 mmol/L — ABNORMAL LOW (ref 101–111)
Creatinine, Ser: 0.75 mg/dL (ref 0.61–1.24)
GFR calc Af Amer: >60 mL/min (ref >60)
GFR calc non Af Amer: >60 mL/min (ref >60)
GLUCOSE: 89 mg/dL (ref 65–99)
POTASSIUM: 3.8 mmol/L (ref 3.5–5.1)
Sodium: 129 mmol/L — ABNORMAL LOW (ref 135–145)

## 2016-08-27 LAB — URINALYSIS, COMPLETE (UACMP) WITH MICROSCOPIC
Bacteria, UA: NONE SEEN
Bilirubin Urine: NEGATIVE
GLUCOSE, UA: NEGATIVE mg/dL
HGB URINE DIPSTICK: NEGATIVE
Ketones, ur: NEGATIVE mg/dL
Leukocytes, UA: NEGATIVE
Nitrite: NEGATIVE
PH: 6 (ref 5.0–8.0)
Protein, ur: NEGATIVE mg/dL
SPECIFIC GRAVITY, URINE: 1.002 — AB (ref 1.005–1.030)
SQUAMOUS EPITHELIAL / LPF: NONE SEEN

## 2016-08-27 LAB — CBC
HCT: 33.4 % — ABNORMAL LOW (ref 40.0–52.0)
Hemoglobin: 11.4 g/dL — ABNORMAL LOW (ref 13.0–18.0)
MCH: 33.3 pg (ref 26.0–34.0)
MCHC: 34.1 g/dL (ref 32.0–36.0)
MCV: 97.5 fL (ref 80.0–100.0)
PLATELETS: 365 10*3/uL (ref 150–440)
RBC: 3.43 MIL/uL — AB (ref 4.40–5.90)
RDW: 14 % (ref 11.5–14.5)
WBC: 6.4 10*3/uL (ref 3.8–10.6)

## 2016-08-27 MED ORDER — SODIUM CHLORIDE 0.9 % IV BOLUS (SEPSIS)
1000.0000 mL | Freq: Once | INTRAVENOUS | Status: AC
  Administered 2016-08-27: 1000 mL via INTRAVENOUS

## 2016-08-27 NOTE — ED Triage Notes (Signed)
Patient states he feels bad "all of the time".  C/O left leg and hip pain and weakness x "weeks".  Patient states he feels like he is going to fall, feels dizzy when standing up.  States "I just feel like I am going to die".    Patient states "I would like a little supper".

## 2016-08-27 NOTE — ED Provider Notes (Signed)
[facility] Emergency Department Provider Note  ____________________________________________   I have reviewed the triage vital signs and the nursing notes.   HISTORY  Chief Complaint Dizziness   History limited by: Not Limited   HPI Caleb Carpenter is a 61 y.o. male who presents to the emergency department today because he has not been feeling well. Sounds like he has not been feeling well for while. He does have a somewhat hard time describing exactly what he means. Does state that that he feels weak and dizzy. Patient denies any chest pain. He does state that he has been having some left leg pain. This also appears to be a somewhat chronic issue. He denies any fevers, chest pain or recent illness.   Past Medical History:  Diagnosis Date  . Asthma   . COPD (chronic obstructive pulmonary disease) (HCC)   . Hypertension     Patient Active Problem List   Diagnosis Date Noted  . Pneumothorax 08/07/2016  . General weakness 09/03/2015  . Generalized weakness 09/02/2015  . Hyponatremia 09/02/2015  . HTN (hypertension) 09/02/2015  . GERD (gastroesophageal reflux disease) 09/02/2015  . Acute posthemorrhagic anemia 06/23/2015  . Hypomagnesemia 06/23/2015  . Leukocytosis 06/23/2015  . Alcohol abuse 06/23/2015  . Thrombocytopenia (HCC) 06/23/2015  . Upper GI bleed 06/21/2015  . Reflux esophagitis   . Gastritis   . Duodenal ulceration     Past Surgical History:  Procedure Laterality Date  . ESOPHAGOGASTRODUODENOSCOPY (EGD) WITH PROPOFOL N/A 06/21/2015   Procedure: ESOPHAGOGASTRODUODENOSCOPY (EGD) WITH PROPOFOL;  Surgeon: Midge Minium, MD;  Location: [facility] ENDOSCOPY;  Service: Endoscopy;  Laterality: N/A;    Prior to Admission medications   Medication Sig Start Date End Date Taking? Authorizing Provider  albuterol (PROVENTIL HFA;VENTOLIN HFA) 108 (90 Base) MCG/ACT inhaler Inhale 2 puffs into the lungs 2 (two) times daily.    [provider]   alendronate (FOSAMAX) 70 MG tablet Take 70 mg by mouth once a week. Take with a full glass of water on an empty stomach.    [provider]  buPROPion (WELLBUTRIN XL) 150 MG 24 hr tablet Take 150 mg by mouth daily.    [provider]  Cholecalciferol (D 5000 PO) Take 5,000 Units by mouth daily.    [provider]  donepezil (ARICEPT) 5 MG tablet Take 5 mg by mouth at bedtime.    [provider]  feeding supplement, ENSURE ENLIVE, (ENSURE ENLIVE) LIQD Take 237 mLs by mouth 2 (two) times daily between meals. 08/13/16   Adrian Saran, MD  fluticasone (FLONASE) 50 MCG/ACT nasal spray Place 2 sprays into both nostrils daily.    [provider]  folic acid (FOLVITE) 1 MG tablet Take 1 mg by mouth 2 (two) times daily.    [provider]  folic acid (FOLVITE) 400 MCG tablet Take 400 mcg by mouth daily.    [provider]  lisinopril (PRINIVIL,ZESTRIL) 20 MG tablet Take 1 tablet (20 mg total) by mouth daily. 08/13/16 09/13/16  Adrian Saran, MD  metoprolol tartrate (LOPRESSOR) 25 MG tablet Take 25 mg by mouth 2 (two) times daily.    [provider]  mirtazapine (REMERON) 7.5 MG tablet Take 1 tablet (7.5 mg total) by mouth at bedtime. 08/13/16 09/13/16  Adrian Saran, MD  nicotine (NICODERM CQ - DOSED IN MG/24 HOURS) 14 mg/24hr patch Place 1 patch (14 mg total) onto the skin daily. 08/13/16   Adrian Saran, MD  pantoprazole (PROTONIX) 40 MG tablet Take 1 tablet (  40 mg total) by mouth 2 (two) times daily. 08/13/16   Adrian Saran, MD  thiamine (VITAMIN B-1) 100 MG tablet Take 100 mg by mouth daily.    [provider]    Allergies Codeine  Family History  Problem Relation Age of Onset  . Diabetes Unknown   . Hypertension Unknown     Social History Social History  Substance Use Topics  . Smoking status: Current Some Day Smoker    Types: Cigarettes    Last attempt to quit: 06/21/2014  . Smokeless tobacco: Current User    Types: Chew  .  Alcohol use 1.2 oz/week    2 Cans of beer per week     Comment: per day    Review of Systems Constitutional: No fever/chills Eyes: No visual changes. ENT: No sore throat. Cardiovascular: Denies chest pain. Respiratory: Denies shortness of breath. Gastrointestinal: No abdominal pain.  No nausea, no vomiting.  No diarrhea.   Genitourinary: Negative for dysuria. Musculoskeletal: Positive for left leg pain. Skin: Negative for rash. Neurological: Negative for headaches, focal weakness or numbness.  ____________________________________________   PHYSICAL EXAM:  VITAL SIGNS: ED Triage Vitals [08/27/16 1726]  Enc Vitals Group     BP (!) 87/58     Pulse Rate 96     Resp 16     Temp 98.2 F (36.8 C)     Temp Source Oral     SpO2 96 %     Weight 95 lb (43.1 kg)     Height 5\' 5"  (1.651 m)     Head Circumference      Peak Flow      Pain Score 10   Constitutional: Alert and oriented. Cachectic appearing.  Eyes: Conjunctivae are normal.  ENT   Head: Normocephalic and atraumatic.   Nose: No congestion/rhinnorhea.   Mouth/Throat: Mucous membranes are moist.   Neck: No stridor. Hematological/Lymphatic/Immunilogical: No cervical lymphadenopathy. Cardiovascular: Normal rate, regular rhythm.  No murmurs, rubs, or gallops.  Respiratory: Normal respiratory effort without tachypnea nor retractions. Breath sounds are clear and equal bilaterally. No wheezes/rales/rhonchi. Gastrointestinal: Soft and non tender. No rebound. No guarding.  Genitourinary: Deferred Musculoskeletal: Normal range of motion in all extremities. No lower extremity edema. Neurologic:  Normal speech and language. No gross focal neurologic deficits are appreciated.  Skin:  Skin is warm, dry and intact. No rash noted. Psychiatric: Mood and affect are normal. Speech and behavior are normal. Patient exhibits appropriate insight and judgment.  ____________________________________________    LABS (pertinent  positives/negatives)  Labs Reviewed  BASIC METABOLIC PANEL - Abnormal; Notable for the following:       Result Value   Sodium 129 (*)    Chloride 99 (*)    BUN <5 (*)    Calcium 8.8 (*)    All other components within normal limits  CBC - Abnormal; Notable for the following:    RBC 3.43 (*)    Hemoglobin 11.4 (*)    HCT 33.4 (*)    All other components within normal limits  URINALYSIS, COMPLETE (UACMP) WITH MICROSCOPIC - Abnormal; Notable for the following:    Color, Urine STRAW (*)    APPearance CLEAR (*)    Specific Gravity, Urine 1.002 (*)    All other components within normal limits  CBG MONITORING, ED     ____________________________________________   EKG  I, Phineas Semen, attending physician, personally viewed and interpreted this EKG  EKG Time: 1742 Rate: 75 Rhythm: normal sinus rhythm Axis: normal Intervals: qtc  431 QRS: q waves V1, V2 ST changes: no st elevation Impression: abnormal ekg  ____________________________________________    RADIOLOGY  Left femur pending   ____________________________________________   PROCEDURES  Procedures  ____________________________________________   INITIAL IMPRESSION / ASSESSMENT AND PLAN / ED COURSE  Pertinent labs & imaging results that were available during my care of the patient were reviewed by me and considered in my medical decision making (see chart for details).  Patient presented to the emergency department today because of concerns for weakness and feeling bad. His blood work shows a hyponatremia which is somewhat baseline. He however remained orthostatic after fluids. Will plan on admission for further hydration.  ____________________________________________   FINAL CLINICAL IMPRESSION(S) / ED DIAGNOSES  Final diagnoses:  Dehydration  Weakness  Left leg pain     Note: This dictation was prepared with Dragon dictation. Any transcriptional errors that result from this process are  unintentional     Phineas Semen, MD 08/27/16 2330

## 2016-08-27 NOTE — ED Notes (Signed)
Patient currently in xray ?

## 2016-08-28 ENCOUNTER — Encounter: Payer: Self-pay | Admitting: Internal Medicine

## 2016-08-28 DIAGNOSIS — E860 Dehydration: Secondary | ICD-10-CM | POA: Diagnosis present

## 2016-08-28 LAB — CBC
HCT: 34.7 % — ABNORMAL LOW (ref 40.0–52.0)
Hemoglobin: 11.8 g/dL — ABNORMAL LOW (ref 13.0–18.0)
MCH: 33.9 pg (ref 26.0–34.0)
MCHC: 34.1 g/dL (ref 32.0–36.0)
MCV: 99.5 fL (ref 80.0–100.0)
Platelets: 316 10*3/uL (ref 150–440)
RBC: 3.49 MIL/uL — AB (ref 4.40–5.90)
RDW: 13.9 % (ref 11.5–14.5)
WBC: 4.5 10*3/uL (ref 3.8–10.6)

## 2016-08-28 LAB — BASIC METABOLIC PANEL
Anion gap: 5 (ref 5–15)
BUN: 7 mg/dL (ref 6–20)
CALCIUM: 8.9 mg/dL (ref 8.9–10.3)
CO2: 26 mmol/L (ref 22–32)
CREATININE: 0.53 mg/dL — AB (ref 0.61–1.24)
Chloride: 104 mmol/L (ref 101–111)
GFR calc non Af Amer: >60 mL/min (ref >60)
GLUCOSE: 87 mg/dL (ref 65–99)
Potassium: 4.8 mmol/L (ref 3.5–5.1)
Sodium: 135 mmol/L (ref 135–145)

## 2016-08-28 LAB — PHOSPHORUS: Phosphorus: 3.8 mg/dL (ref 2.5–4.6)

## 2016-08-28 LAB — MAGNESIUM: Magnesium: 1.5 mg/dL — ABNORMAL LOW (ref 1.7–2.4)

## 2016-08-28 MED ORDER — VITAMIN D 1000 UNITS PO TABS: 5000.0000 [IU] | ORAL_TABLET | Freq: Every day | ORAL | Status: DC

## 2016-08-28 MED ORDER — ALBUTEROL SULFATE (2.5 MG/3ML) 0.083% IN NEBU: 2.5000 mg | INHALATION_SOLUTION | Freq: Four times a day (QID) | RESPIRATORY_TRACT | Status: DC | PRN

## 2016-08-28 MED ORDER — IPRATROPIUM BROMIDE 0.02 % IN SOLN: 0.5000 mg | Freq: Four times a day (QID) | RESPIRATORY_TRACT | Status: DC | PRN

## 2016-08-28 MED ORDER — ONDANSETRON HCL 4 MG/2ML IJ SOLN: 4.0000 mg | Freq: Four times a day (QID) | INTRAMUSCULAR | Status: DC | PRN

## 2016-08-28 MED ORDER — PANTOPRAZOLE SODIUM 40 MG PO TBEC
40.0000 mg | DELAYED_RELEASE_TABLET | Freq: Two times a day (BID) | ORAL | Status: DC
  Administered 2016-08-28: 13:00:00 40 mg via ORAL
  Filled 2016-08-28: qty 1

## 2016-08-28 MED ORDER — SENNOSIDES-DOCUSATE SODIUM 8.6-50 MG PO TABS: 1.0000 | ORAL_TABLET | Freq: Every evening | ORAL | Status: DC | PRN

## 2016-08-28 MED ORDER — DONEPEZIL HCL 5 MG PO TABS: 5.0000 mg | ORAL_TABLET | Freq: Every day | ORAL | Status: DC

## 2016-08-28 MED ORDER — ALBUTEROL SULFATE HFA 108 (90 BASE) MCG/ACT IN AERS: 2.0000 | INHALATION_SPRAY | Freq: Two times a day (BID) | RESPIRATORY_TRACT | Status: DC

## 2016-08-28 MED ORDER — ACETAMINOPHEN 325 MG PO TABS: 650.0000 mg | ORAL_TABLET | Freq: Four times a day (QID) | ORAL | Status: DC | PRN

## 2016-08-28 MED ORDER — ONDANSETRON HCL 4 MG PO TABS: 4.0000 mg | ORAL_TABLET | Freq: Four times a day (QID) | ORAL | Status: DC | PRN

## 2016-08-28 MED ORDER — BISACODYL 5 MG PO TBEC: 5.0000 mg | DELAYED_RELEASE_TABLET | Freq: Every day | ORAL | Status: DC | PRN

## 2016-08-28 MED ORDER — ENOXAPARIN SODIUM 40 MG/0.4ML ~~LOC~~ SOLN: 40.0000 mg | SUBCUTANEOUS | Status: DC

## 2016-08-28 MED ORDER — VITAMIN B-1 100 MG PO TABS
100.0000 mg | ORAL_TABLET | Freq: Every day | ORAL | Status: DC
  Administered 2016-08-28: 100 mg via ORAL
  Filled 2016-08-28: qty 1

## 2016-08-28 MED ORDER — BUPROPION HCL ER (XL) 150 MG PO TB24
150.0000 mg | ORAL_TABLET | Freq: Every day | ORAL | Status: DC
  Administered 2016-08-28: 13:00:00 150 mg via ORAL
  Filled 2016-08-28: qty 1

## 2016-08-28 MED ORDER — SODIUM CHLORIDE 0.9 % IV SOLN
INTRAVENOUS | Status: DC
  Administered 2016-08-28: 03:00:00 via INTRAVENOUS

## 2016-08-28 MED ORDER — ENSURE ENLIVE PO LIQD: 237.0000 mL | Freq: Two times a day (BID) | ORAL | Status: DC

## 2016-08-28 MED ORDER — DILTIAZEM HCL ER COATED BEADS 120 MG PO CP24
120.0000 mg | ORAL_CAPSULE | Freq: Every day | ORAL | Status: DC
  Administered 2016-08-28: 120 mg via ORAL
  Filled 2016-08-28: qty 1

## 2016-08-28 MED ORDER — MAGNESIUM SULFATE 4 GM/100ML IV SOLN
4.0000 g | Freq: Once | INTRAVENOUS | Status: AC
  Administered 2016-08-28: 4 g via INTRAVENOUS
  Filled 2016-08-28: qty 100

## 2016-08-28 MED ORDER — ACETAMINOPHEN 650 MG RE SUPP: 650.0000 mg | Freq: Four times a day (QID) | RECTAL | Status: DC | PRN

## 2016-08-28 MED ORDER — MIRTAZAPINE 15 MG PO TABS: 7.5000 mg | ORAL_TABLET | Freq: Every day | ORAL | Status: DC

## 2016-08-28 MED ORDER — MAGNESIUM CITRATE PO SOLN
1.0000 | Freq: Once | ORAL | Status: DC | PRN
  Filled 2016-08-28: qty 296

## 2016-08-28 MED ORDER — ZOLPIDEM TARTRATE 5 MG PO TABS: 5.0000 mg | ORAL_TABLET | Freq: Every evening | ORAL | Status: DC | PRN

## 2016-08-28 MED ORDER — FOLIC ACID 1 MG PO TABS
1.0000 mg | ORAL_TABLET | Freq: Every day | ORAL | Status: DC
  Administered 2016-08-28: 13:00:00 1 mg via ORAL
  Filled 2016-08-28: qty 1

## 2016-08-28 NOTE — Progress Notes (Signed)
With patient's permission RN called Myriam Jacobson (sister in law)  to update on patient's status. Per Myriam Jacobson patient cannot return to her home and needs to seek other means of living. SW consult placed.

## 2016-08-28 NOTE — Progress Notes (Signed)
Initial Nutrition Assessment  DOCUMENTATION CODES:   Severe malnutrition in context of chronic illness, Underweight  INTERVENTION:  Recommend liberalizing diet from Heart Healthy to Regular. Patient will need chopped meats.  Provide Ensure Enlive po BID, each supplement provides 350 kcal and 20 grams of protein.   Recommend daily multivitamin with minerals, thiamin 100 mg, folic acid 1 mg with history of EtOH abuse and severe malnutrition.  Encouraged patient to continue high-calorie, high-protein snacks at home. This RD had reviewed handout with patient on previous admission. Encouraged him to drink an oral nutrition supplement at home to promote ongoing weight gain.  NUTRITION DIAGNOSIS:   Malnutrition (Severe) related to chronic illness (COPD, EtOH abuse) as evidenced by severe depletion of body fat, severe depletion of muscle mass.  GOAL:   Patient will meet greater than or equal to 90% of their needs  MONITOR:   PO intake, Supplement acceptance, Labs, Weight trends  REASON FOR ASSESSMENT:   Malnutrition Screening Tool    ASSESSMENT:   61 year old male with PMHx of HTN, asthma, COPD, EtOH abuse presents with weakness and dizziness found to have dehydration, orthostatic hypotension, hyponatremia.   Spoke with patient at bedside. He is known to this RD from previous admission. Patient reports he ate very well during last admission. He enjoyed the Ensure and also reports he ate graham crackers with peanut butter between meals for snacks. He reports at home he continued to eat the same he always had. He eats one meal per day at home that his brother prepares (usually a meat or pinto beans with potatoes). He has started to add some snacks into his day such as peanut butter or candy bars. He has not been drinking oral nutrition supplements at home. Continues to take MVI daily.  Patient had previously reported UBW was 150 lbs many years ago. Today reporting it was recently 115 lbs  before he lost some weight, but he is unsure when he lost from the 115. Since last admission patient has gained 5.4 lbs back.  Medications reviewed and include: NS @ 75 ml/hr, magnesium sulfate 4 grams IV once today.  Labs reviewed: Creatinine 0.53, Magnesium 1.5. Potassium and Phosphorus WNL.  Nutrition-Focused physical exam completed. Findings are severe fat depletion, severe muscle depletion, and no edema. Patient is edentulous. He reports he does not have his dentures here but does not like to use them anyway. He reports he is still able to eat most foods except for "crusty" meat. Does not want his diet downgraded.  Discussed with RN.  Diet Order:  Diet Heart Room service appropriate? Yes; Fluid consistency: Thin  Skin:  Reviewed, no issues  Last BM:  08/27/2016  Height:   Ht Readings from Last 1 Encounters:  08/28/16 5\' 5"  (1.651 m)    Weight:   Wt Readings from Last 1 Encounters:  08/28/16 108 lb 4.8 oz (49.1 kg)    Ideal Body Weight:  61.8 kg  BMI:  Body mass index is 18.02 kg/m.  Estimated Nutritional Needs:   Kcal:  1590-1830 (MSJ x 1.3-1.5)  Protein:  74-90 grams (1.5-1.8 grams/kg)  Fluid:  1.5-1.8 L/day  EDUCATION NEEDS:   No education needs identified at this time  Helane Rima, MS, RD, LDN Pager: [phone] After Hours Pager: [phone]

## 2016-08-28 NOTE — Discharge Instructions (Signed)
Patient advised to stop smoking and stop drinking alcohol

## 2016-08-28 NOTE — H&P (Signed)
[facility] -  at [facility]   PATIENT NAME: Caleb Carpenter    MR#:  [id number]  DATE OF BIRTH:  1956/01/07  DATE OF ADMISSION:  08/27/2016  PRIMARY CARE PHYSICIAN: Evelene Croon, MD   REQUESTING/REFERRING PHYSICIAN:   CHIEF COMPLAINT:   Chief Complaint  Patient presents with  . Dizziness    HISTORY OF PRESENT ILLNESS: Caleb Carpenter  is a 61 y.o. male with a known history of COPD, hypertension presented to the emergency room with generalized weakness. Patient also felt dizzy for the last 1 day. He was evaluated in the emergency room his blood pressure was low and he appeared dry and dehydrated. He also had low sodium level and he was given IV fluids in the emergency room. No complaints of any chest pain. No history of any palpitations. No fever or chills. He also complained of for left lower extremity pain and he was evaluated with x-ray of left hip which showed osteoarthrosis. Hospitalist service was consulted for further care of the patient.  PAST MEDICAL HISTORY:   Past Medical History:  Diagnosis Date  . Asthma   . COPD (chronic obstructive pulmonary disease) (HCC)   . Hypertension     PAST SURGICAL HISTORY: Past Surgical History:  Procedure Laterality Date  . ESOPHAGOGASTRODUODENOSCOPY (EGD) WITH PROPOFOL N/A 06/21/2015   Procedure: ESOPHAGOGASTRODUODENOSCOPY (EGD) WITH PROPOFOL;  Surgeon: Midge Minium, MD;  Location: [facility] ENDOSCOPY;  Service: Endoscopy;  Laterality: N/A;    SOCIAL HISTORY:  Social History  Substance Use Topics  . Smoking status: Current Some Day Smoker    Types: Cigarettes    Last attempt to quit: 06/21/2014  . Smokeless tobacco: Current User    Types: Chew  . Alcohol use 1.2 oz/week    2 Cans of beer per week     Comment: per day    FAMILY HISTORY:  Family History  Problem Relation Age of Onset  . Diabetes Unknown   . Hypertension Unknown     DRUG ALLERGIES:  Allergies  Allergen Reactions  . Codeine Other (See  Comments)    Nasal drip    REVIEW OF SYSTEMS:   CONSTITUTIONAL: No fever, had weakness.  EYES: No blurred or double vision.  EARS, NOSE, AND THROAT: No tinnitus or ear pain.  RESPIRATORY: No cough, shortness of breath, wheezing or hemoptysis.  CARDIOVASCULAR: No chest pain, orthopnea, edema.  GASTROINTESTINAL: No nausea, vomiting, diarrhea or abdominal pain.  GENITOURINARY: No dysuria, hematuria.  ENDOCRINE: No polyuria, nocturia,  HEMATOLOGY: No anemia, easy bruising or bleeding SKIN: No rash or lesion. MUSCULOSKELETAL: left hip discomfort NEUROLOGIC: No tingling, numbness, weakness.  PSYCHIATRY: No anxiety or depression.   MEDICATIONS AT HOME:  Prior to Admission medications   Medication Sig Start Date End Date Taking? Authorizing Provider  albuterol (PROVENTIL HFA;VENTOLIN HFA) 108 (90 Base) MCG/ACT inhaler Inhale 2 puffs into the lungs 2 (two) times daily.    [provider]  alendronate (FOSAMAX) 70 MG tablet Take 70 mg by mouth once a week. Take with a full glass of water on an empty stomach.    [provider]  buPROPion (WELLBUTRIN XL) 150 MG 24 hr tablet Take 150 mg by mouth daily.    [provider]  Cholecalciferol (D 5000 PO) Take 5,000 Units by mouth daily.    [provider]  donepezil (ARICEPT) 5 MG tablet Take 5 mg by mouth at bedtime.    [provider]  feeding supplement, ENSURE ENLIVE, (ENSURE ENLIVE) LIQD Take  237 mLs by mouth 2 (two) times daily between meals. 08/13/16   Adrian Saran, MD  fluticasone (FLONASE) 50 MCG/ACT nasal spray Place 2 sprays into both nostrils daily.    [provider]  folic acid (FOLVITE) 1 MG tablet Take 1 mg by mouth 2 (two) times daily.    [provider]  folic acid (FOLVITE) 400 MCG tablet Take 400 mcg by mouth daily.    [provider]  lisinopril (PRINIVIL,ZESTRIL) 20 MG tablet Take 1 tablet (20 mg total) by mouth daily. 08/13/16 09/13/16  Adrian Saran, MD  metoprolol  tartrate (LOPRESSOR) 25 MG tablet Take 25 mg by mouth 2 (two) times daily.    [provider]  mirtazapine (REMERON) 7.5 MG tablet Take 1 tablet (7.5 mg total) by mouth at bedtime. 08/13/16 09/13/16  Adrian Saran, MD  nicotine (NICODERM CQ - DOSED IN MG/24 HOURS) 14 mg/24hr patch Place 1 patch (14 mg total) onto the skin daily. 08/13/16   Adrian Saran, MD  pantoprazole (PROTONIX) 40 MG tablet Take 1 tablet (40 mg total) by mouth 2 (two) times daily. 08/13/16   Adrian Saran, MD  thiamine (VITAMIN B-1) 100 MG tablet Take 100 mg by mouth daily.    [provider]      PHYSICAL EXAMINATION:   VITAL SIGNS: Blood pressure 111/86, pulse (!) 113, temperature 98.2 F (36.8 C), temperature source Oral, resp. rate 20, height 5\' 5"  (1.651 m), weight 49.1 kg (108 lb 4.8 oz), SpO2 97 %.  GENERAL:  61 y.o.-year-old patient lying in the bed with no acute distress.  EYES: Pupils equal, round, reactive to light and accommodation. No scleral icterus. Extraocular muscles intact.  HEENT: Head atraumatic, normocephalic. Oropharynx dry and nasopharynx clear.  NECK:  Supple, no jugular venous distention. No thyroid enlargement, no tenderness.  LUNGS: Normal breath sounds bilaterally, no wheezing, rales,rhonchi or crepitation. No use of accessory muscles of respiration.  CARDIOVASCULAR: S1, S2 normal. No murmurs, rubs, or gallops.  ABDOMEN: Soft, nontender, nondistended. Bowel sounds present. No organomegaly or mass.  EXTREMITIES: No pedal edema, cyanosis, or clubbing.  NEUROLOGIC: Cranial nerves II through XII are intact. Muscle strength 5/5 in all extremities. Sensation intact. Gait not checked.  PSYCHIATRIC: The patient is alert and oriented x 3.  SKIN: No obvious rash, lesion, or ulcer.   LABORATORY PANEL:   CBC  Recent Labs Lab 08/27/16 1736  WBC 6.4  HGB 11.4*  HCT 33.4*  PLT 365  MCV 97.5  MCH 33.3  MCHC 34.1  RDW 14.0    ------------------------------------------------------------------------------------------------------------------  Chemistries   Recent Labs Lab 08/27/16 1736  NA 129*  K 3.8  CL 99*  CO2 23  GLUCOSE 89  BUN <5*  CREATININE 0.75  CALCIUM 8.8*   ------------------------------------------------------------------------------------------------------------------ estimated creatinine clearance is 66.5 mL/min (by C-G formula based on SCr of 0.75 mg/dL). ------------------------------------------------------------------------------------------------------------------ No results for input(s): TSH, T4TOTAL, T3FREE, THYROIDAB in the last 72 hours.  Invalid input(s): FREET3   Coagulation profile No results for input(s): INR, PROTIME in the last 168 hours. ------------------------------------------------------------------------------------------------------------------- No results for input(s): DDIMER in the last 72 hours. -------------------------------------------------------------------------------------------------------------------  Cardiac Enzymes No results for input(s): CKMB, TROPONINI, MYOGLOBIN in the last 168 hours.  Invalid input(s): CK ------------------------------------------------------------------------------------------------------------------ Invalid input(s): POCBNP  ---------------------------------------------------------------------------------------------------------------  Urinalysis    Component Value Date/Time   COLORURINE STRAW (A) 08/27/2016 2059   APPEARANCEUR CLEAR (A) 08/27/2016 2059   LABSPEC 1.002 (L) 08/27/2016 2059   PHURINE 6.0 08/27/2016 2059   GLUCOSEU NEGATIVE 08/27/2016 2059  HGBUR NEGATIVE 08/27/2016 2059   BILIRUBINUR NEGATIVE 08/27/2016 2059   KETONESUR NEGATIVE 08/27/2016 2059   PROTEINUR NEGATIVE 08/27/2016 2059   NITRITE NEGATIVE 08/27/2016 2059   LEUKOCYTESUR NEGATIVE 08/27/2016 2059     RADIOLOGY: Dg Chest 2  View  Result Date: 08/27/2016 CLINICAL DATA:  Dizziness EXAM: CHEST  2 VIEW COMPARISON:  08/13/2016 FINDINGS: Severe hyperinflation with emphysematous disease. Peripherally attenuated parenchymal markings pot no definitive pleural line to suggest a pneumothorax. Normal heart size. Aortic atherosclerosis. Multiple bilateral old appearing rib fractures. Chronic wedging of lower thoracic/ upper lumbar vertebra. Ununited old right proximal humerus fracture. IMPRESSION: 1. Severe hyperinflation with emphysematous disease. No acute infiltrate or edema. Electronically Signed   By: Jasmine Pang M.D.   On: 08/27/2016 20:37   Dg Femur Min 2 Views Left  Result Date: 08/27/2016 CLINICAL DATA:  Hip pain EXAM: LEFT FEMUR 2 VIEWS COMPARISON:  None. FINDINGS: The bones are osteopenic. There are extensive vascular calcifications. No fracture or dislocation of the left hip. No femoral fracture. The hip and knee are approximated with mild osteoarthrosis. IMPRESSION: Mild left hip osteoarthrosis without acute abnormality. Electronically Signed   By: Deatra Robinson M.D.   On: 08/27/2016 23:45    EKG: Orders placed or performed during the hospital encounter of 08/27/16  . ED EKG  . ED EKG  . EKG 12-Lead    IMPRESSION AND PLAN: 61 year old male patient with history of COPD, hypertension presented to the emergency room with weakness and dizziness. Admitting diagnosis 1. Dehydration 2. Orthostatic hypotension 3. Hyponatremia 4. Emphysema Treatment plan Admit patient to medical floor observation bed IV fluid hydration Follow up sodium level DVT prophylaxis subcutaneous Lovenox 40 MG daily Supportive care.  All the records are reviewed and case discussed with ED provider. Management plans discussed with the patient, family and they are in agreement.  CODE STATUS:FULL CODE    Code Status Orders        Start     Ordered   08/28/16 0301  Full code  Continuous     08/28/16 0300    Code Status History     Date Active Date Inactive Code Status Order ID Comments User Context   08/07/2016  9:45 PM 08/13/2016  8:32 PM Full Code [id number]  Adrian Saran, MD Inpatient   09/03/2015  1:16 AM 09/04/2015  7:49 PM Full Code [id number]  Oralia Manis, MD Inpatient   06/21/2015  9:01 AM 06/23/2015  8:05 PM Full Code [id number]  Katha Hamming, MD ED       TOTAL TIME TAKING CARE OF THIS PATIENT: 50 minutes.    Ihor Austin M.D on 08/28/2016 at 4:50 AM  Between 7am to 6pm - Pager - [phone]  After 6pm go to [email or web address] - password [email or web address] [facility]  [facility]  Office  [phone]  CC: Primary care physician; Evelene Croon, MD

## 2016-08-28 NOTE — Evaluation (Signed)
Physical Therapy Evaluation Patient Details Name: Caleb Carpenter MRN: [id number] DOB: Mar 07, 1956 Today's Date: 08/28/2016   History of Present Illness  Pt is a 61 y.o. male with a known history of COPD, hypertension presented to the emergency room with generalized weakness. Patient also felt dizzy for the last 1 day. He was evaluated in the emergency room his blood pressure was low and he appeared dry and dehydrated. He also had low sodium level and he was given IV fluids in the emergency room. No complaints of any chest pain. No history of any palpitations. No fever or chills. He also complained of for left lower extremity pain and he was evaluated with x-ray of left hip which showed osteoarthrosis. Hospitalist service was consulted for further care of the patient.    Clinical Impression  Pt presents with slight deficit in strength but primarily limited by deficits in activity tolerance.  Pt education provided regarding physiological benefits of activity, principles of activity progression, and energy conservation related to COPD.  Pt Ind with bed mobility tasks and transfers.  Pt able to Amb 30' with SBA with a w/c follow.  SpO2 remained >/= 94% on room air during session with HR increasing from 95 bpm to 112 bpm after bed mobility/transfer and to 120 bpm after ascending/descending 3 steps with one rail.  HR returned to lower 100s after sitting < 60 sec.  Pt will benefit from HHPT services to safely address above deficits for decreased caregiver assistance and improved community access upon discharge from acute care.      Follow Up Recommendations Home health PT    Equipment Recommendations  None recommended by PT    Recommendations for Other Services       Precautions / Restrictions Precautions Precautions: Fall Restrictions Weight Bearing Restrictions: No      Mobility  Bed Mobility Overal bed mobility: Independent             General bed mobility comments: Good effort with  all bed mobility tasks with no extra time and effort required  Transfers Overall transfer level: Independent Equipment used: None Transfers: Sit to/from Stand Sit to Stand: Independent         General transfer comment: Good effort and stability during sit to/from stand  Ambulation/Gait Ambulation/Gait assistance: Supervision Ambulation Distance (Feet): 30 Feet Assistive device: None Gait Pattern/deviations: WFL(Within Functional Limits)   Gait velocity interpretation: Below normal speed for age/gender General Gait Details: SpO2 remained >/= 94% on room air during session with HR increasing from 95 bpm to 112 bpm after bed mobility/transfer and to 120 after ascending/descending 3 steps with one rail.  HR returned to lower 100s after sitting < 60 sec.   Stairs Stairs: Yes Stairs assistance: Supervision Stair Management: One rail Left Number of Stairs: 3 General stair comments: Steady up/down steps without LOB  Wheelchair Mobility    Modified Rankin (Stroke Patients Only)       Balance Overall balance assessment: Needs assistance Sitting-balance support: No upper extremity supported;Feet supported Sitting balance-Leahy Scale: Normal     Standing balance support: No upper extremity supported Standing balance-Leahy Scale: Good                               Pertinent Vitals/Pain Pain Assessment: No/denies pain    Home Living Family/patient expects to be discharged to:: Private residence Living Arrangements: Other relatives (Pt lives with brother and sister-in-law) Available Help at Discharge: Family;Available 24  hours/day Type of Home: House Home Access: Stairs to enter Entrance Stairs-Rails: Left Entrance Stairs-Number of Steps: 2 Home Layout: Two level;Able to live on main level with bedroom/bathroom Home Equipment: Dan Humphreys - 2 wheels      Prior Function Level of Independence: Independent         Comments: Ind Amb without AD limited community  distances with one fall in the last 12 months, Ind with ADLs     Hand Dominance   Dominant Hand: Right    Extremity/Trunk Assessment   Upper Extremity Assessment Upper Extremity Assessment: Overall WFL for tasks assessed    Lower Extremity Assessment Lower Extremity Assessment: Overall WFL for tasks assessed       Communication   Communication: No difficulties  Cognition Arousal/Alertness: Awake/alert Behavior During Therapy: WFL for tasks assessed/performed Overall Cognitive Status: Within Functional Limits for tasks assessed                                        General Comments      Exercises     Assessment/Plan    PT Assessment Patient needs continued PT services  PT Problem List Decreased activity tolerance       PT Treatment Interventions Therapeutic activities;Therapeutic exercise;Patient/family education    PT Goals (Current goals can be found in the Care Plan section)  Acute Rehab PT Goals Patient Stated Goal: To get stronger and to be able to walk longer distances without feeling fatigued PT Goal Formulation: With patient Time For Goal Achievement: 09/10/16 Potential to Achieve Goals: Good    Frequency Min 2X/week   Barriers to discharge        Co-evaluation               AM-PAC PT "6 Clicks" Daily Activity  Outcome Measure Difficulty turning over in bed (including adjusting bedclothes, sheets and blankets)?: None Difficulty moving from lying on back to sitting on the side of the bed? : None Difficulty sitting down on and standing up from a chair with arms (e.g., wheelchair, bedside commode, etc,.)?: None Help needed moving to and from a bed to chair (including a wheelchair)?: None Help needed walking in hospital room?: A Little Help needed climbing 3-5 steps with a railing? : A Little 6 Click Score: 22    End of Session Equipment Utilized During Treatment: Gait belt Activity Tolerance: Patient limited by  fatigue Patient left: in bed;with bed alarm set;with call bell/phone within reach Nurse Communication: Mobility status PT Visit Diagnosis: Difficulty in walking, not elsewhere classified (R26.2)    Time: 1340-1408 PT Time Calculation (min) (ACUTE ONLY): 28 min   Charges:   PT Evaluation $PT Eval Low Complexity: 1 Procedure PT Treatments $Therapeutic Activity: 8-22 mins   PT G Codes:   PT G-Codes **NOT FOR INPATIENT CLASS** Functional Assessment Tool Used: AM-PAC 6 Clicks Basic Mobility Functional Limitation: Mobility: Walking and moving around Mobility: Walking and Moving Around Current Status (R0413): At least 20 percent but less than 40 percent impaired, limited or restricted Mobility: Walking and Moving Around Goal Status [phone]): At least 1 percent but less than 20 percent impaired, limited or restricted    D. Scott Nakoa Ganus PT, DPT 08/28/16, 4:08 PM

## 2016-08-28 NOTE — Discharge Summary (Addendum)
[facility] [place] [facility]   PATIENT NAME: Caleb Carpenter    MR#:  [id number]  DATE OF BIRTH:  [birth year]  DATE OF ADMISSION:  08/27/2016 ADMITTING PHYSICIAN: Tonye Royalty, DO  DATE OF DISCHARGE: 08/28/2016  PRIMARY CARE PHYSICIAN: Evelene Croon, MD    ADMISSION DIAGNOSIS:  Dehydration [E86.0] Weakness [R53.1] Pain [R52] Left leg pain [M79.605]  DISCHARGE DIAGNOSIS:  Generalized weakness secondary to mild hyponatremia from dehydration Severe protein calorie malnutrition SECONDARY DIAGNOSIS:   Past Medical History:  Diagnosis Date  . Asthma   . COPD (chronic obstructive pulmonary disease) (HCC)   . Hypertension     HOSPITAL COURSE:  Caleb Carpenter  is a 61 y.o. male with a known history of COPD, hypertension presented to the emergency room with generalized weakness. Patient also felt dizzy for the last 1 day  1. Dehydration/mild hyponatremia resolved after IV hydration -Patient eating and drinking well.  2. History of hypertension -Patient on quite a few medications I will just continue Cardizem at present  3. Hyponatremia -Resolved -Patient advised not to drink alcohol. He reports drinking one to 2 cans of beer  4. Emphysema-advised smoking cessation. Sats 98% on room air  5. Severe protein calorie malnutrition patient recommended to follow dietitian recommendations which were given to him  Per RN patient is ambulating well in the room  Discharge home.  CONSULTS OBTAINED:    DRUG ALLERGIES:   Allergies  Allergen Reactions  . Codeine Other (See Comments)    Nasal drip    DISCHARGE MEDICATIONS:   Current Discharge Medication List    CONTINUE these medications which have NOT CHANGED   Details  albuterol (PROVENTIL HFA;VENTOLIN HFA) 108 (90 Base) MCG/ACT inhaler Inhale 2 puffs into the lungs 2 (two) times daily.    alendronate (FOSAMAX) 70 MG tablet Take 70 mg by mouth once a week. Take with a full glass of water on  an empty stomach.    buPROPion (WELLBUTRIN XL) 150 MG 24 hr tablet Take 150 mg by mouth daily.    diltiazem (CARDIZEM CD) 120 MG 24 hr capsule Take 120 mg by mouth daily.    donepezil (ARICEPT) 5 MG tablet Take 5 mg by mouth at bedtime.    fluticasone (FLOVENT HFA) 220 MCG/ACT inhaler Inhale 2 puffs into the lungs 2 (two) times daily.    Fluticasone-Salmeterol (ADVAIR) 250-50 MCG/DOSE AEPB Inhale 1 puff into the lungs 2 (two) times daily.    folic acid (FOLVITE) 1 MG tablet Take 1 mg by mouth daily.     mirtazapine (REMERON) 7.5 MG tablet Take 1 tablet (7.5 mg total) by mouth at bedtime. Qty: 30 tablet, Refills: 0    nicotine (NICODERM CQ - DOSED IN MG/24 HOURS) 14 mg/24hr patch Place 1 patch (14 mg total) onto the skin daily. Qty: 28 patch, Refills: 0    pantoprazole (PROTONIX) 40 MG tablet Take 1 tablet (40 mg total) by mouth 2 (two) times daily. Qty: 60 tablet, Refills: 0    thiamine (VITAMIN B-1) 100 MG tablet Take 100 mg by mouth daily.    Tiotropium Bromide-Olodaterol (STIOLTO RESPIMAT) 2.5-2.5 MCG/ACT AERS Inhale 2 puffs into the lungs daily.    Cholecalciferol (D 5000 PO) Take 5,000 Units by mouth daily.    feeding supplement, ENSURE ENLIVE, (ENSURE ENLIVE) LIQD Take 237 mLs by mouth 2 (two) times daily between meals. Qty: 237 mL, Refills: 12        If you experience worsening of your admission symptoms, develop  shortness of breath, life threatening emergency, suicidal or homicidal thoughts you must seek medical attention immediately by calling 911 or calling your MD immediately  if symptoms less severe.  You Must read complete instructions/literature along with all the possible adverse reactions/side effects for all the Medicines you take and that have been prescribed to you. Take any new Medicines after you have completely understood and accept all the possible adverse reactions/side effects.   Please note  You were cared for by a hospitalist during your hospital  stay. If you have any questions about your discharge medications or the care you received while you were in the hospital after you are discharged, you can call the unit and asked to speak with the hospitalist on call if the hospitalist that took care of you is not available. Once you are discharged, your primary care physician will handle any further medical issues. Please note that NO REFILLS for any discharge medications will be authorized once you are discharged, as it is imperative that you return to your primary care physician (or establish a relationship with a primary care physician if you do not have one) for your aftercare needs so that they can reassess your need for medications and monitor your lab values. Today   SUBJECTIVE  Physical therapy. And bleeding in the room by himself.   VITAL SIGNS:  Blood pressure 118/80, pulse 88, temperature 98.1 F (36.7 C), temperature source Oral, resp. rate 20, height 5\' 5"  (1.651 m), weight 49.1 kg (108 lb 4.8 oz), SpO2 98 %.  I/O:   Intake/Output Summary (Last 24 hours) at 08/28/16 1123 Last data filed at 08/28/16 0911  Gross per 24 hour  Intake              376 ml  Output              400 ml  Net              -24 ml    PHYSICAL EXAMINATION:  GENERAL:  61 y.o.-year-old patient lying in the bed with no acute distress. Thin cachectic EYES: Pupils equal, round, reactive to light and accommodation. No scleral icterus. Extraocular muscles intact.  HEENT: Head atraumatic, normocephalic. Oropharynx and nasopharynx clear.  NECK:  Supple, no jugular venous distention. No thyroid enlargement, no tenderness.  LUNGS: Normal breath sounds bilaterally, no wheezing, rales,rhonchi or crepitation. No use of accessory muscles of respiration.  CARDIOVASCULAR: S1, S2 normal. No murmurs, rubs, or gallops.  ABDOMEN: Soft, non-tender, non-distended. Bowel sounds present. No organomegaly or mass.  EXTREMITIES: No pedal edema, cyanosis, or clubbing.  NEUROLOGIC:  Cranial nerves II through XII are intact. Muscle strength 5/5 in all extremities. Sensation intact. Gait not checked.  PSYCHIATRIC: The patient is alert and oriented x 3.  SKIN: No obvious rash, lesion, or ulcer.   DATA REVIEW:   CBC   Recent Labs Lab 08/28/16 0334  WBC 4.5  HGB 11.8*  HCT 34.7*  PLT 316    Chemistries   Recent Labs Lab 08/28/16 0334  NA 135  K 4.8  CL 104  CO2 26  GLUCOSE 87  BUN 7  CREATININE 0.53*  CALCIUM 8.9  MG 1.5*    Microbiology Results   No results found for this or any previous visit (from the past 240 hour(s)).  RADIOLOGY:  Dg Chest 2 View  Result Date: 08/27/2016 CLINICAL DATA:  Dizziness EXAM: CHEST  2 VIEW COMPARISON:  08/13/2016 FINDINGS: Severe hyperinflation with emphysematous disease. Peripherally attenuated parenchymal  markings pot no definitive pleural line to suggest a pneumothorax. Normal heart size. Aortic atherosclerosis. Multiple bilateral old appearing rib fractures. Chronic wedging of lower thoracic/ upper lumbar vertebra. Ununited old right proximal humerus fracture. IMPRESSION: 1. Severe hyperinflation with emphysematous disease. No acute infiltrate or edema. Electronically Signed   By: Jasmine Pang M.D.   On: 08/27/2016 20:37   Dg Femur Min 2 Views Left  Result Date: 08/27/2016 CLINICAL DATA:  Hip pain EXAM: LEFT FEMUR 2 VIEWS COMPARISON:  None. FINDINGS: The bones are osteopenic. There are extensive vascular calcifications. No fracture or dislocation of the left hip. No femoral fracture. The hip and knee are approximated with mild osteoarthrosis. IMPRESSION: Mild left hip osteoarthrosis without acute abnormality. Electronically Signed   By: Deatra Robinson M.D.   On: 08/27/2016 23:45     Management plans discussed with the patient, family and they are in agreement.  CODE STATUS:     Code Status Orders        Start     Ordered   08/28/16 0301  Full code  Continuous     08/28/16 0300    Code Status History     Date Active Date Inactive Code Status Order ID Comments User Context   08/07/2016  9:45 PM 08/13/2016  8:32 PM Full Code [id number]  Adrian Saran, MD Inpatient   09/03/2015  1:16 AM 09/04/2015  7:49 PM Full Code [id number]  Oralia Manis, MD Inpatient   06/21/2015  9:01 AM 06/23/2015  8:05 PM Full Code [id number]  Katha Hamming, MD ED      TOTAL TIME TAKING CARE OF THIS PATIENT: *40* minutes.    Emmerson Taddei M.D on 08/28/2016 at 11:23 AM  Between 7am to 6pm - Pager - [phone] After 6pm go to [email or web address] - Social research officer, government  [facility]  Office  [phone]  CC: Primary care physician; Evelene Croon, MD

## 2016-08-28 NOTE — Clinical Social Work Note (Signed)
Pt is ready for discharge today. CSW consulted for affordable housing options. However, per RN pt is unable to return home as he lives with brother and sister in law. CSW was then consulted for homelessness. CSW met with and provided resources. Pt is agreeable to going to the local shelter. CSW left 3 messages with [employer] about pt coming for an assessment. Pt Pt has limited literacy. Pt is unable to secure a ride and would have difficultly navigating the bus system. CSW provided a taxi voucher for transportation needs. Pt is in agreement with discharge plan. RN is aware. CSW is signing off as no further needs identified.   Darden Dates, MSW, LCSW Clinical Social Worker  [phone]

## 2016-08-28 NOTE — Progress Notes (Signed)
Patient discharged to homeless shelter via cab. Discharge instructions reviewed with patient. Patient will contact family to obtain belongings. IV removed without complications.

## 2016-08-28 NOTE — Plan of Care (Signed)
Problem: Education: Goal: Knowledge of [facility] Education information/materials will improve Outcome: Progressing Pt oriented to unit, including FPP, call bell.  VS WDL, free of falls during shift.  Denies pain.  No needs since arrival to unit.  Bed in low position, call bell within reach.  WCTM.

## 2016-09-28 DIAGNOSIS — J441 Chronic obstructive pulmonary disease with (acute) exacerbation: Secondary | ICD-10-CM | POA: Insufficient documentation

## 2016-09-28 DIAGNOSIS — R0602 Shortness of breath: Secondary | ICD-10-CM | POA: Diagnosis present

## 2016-09-28 DIAGNOSIS — I10 Essential (primary) hypertension: Secondary | ICD-10-CM | POA: Diagnosis not present

## 2016-09-28 DIAGNOSIS — Z79899 Other long term (current) drug therapy: Secondary | ICD-10-CM | POA: Diagnosis not present

## 2016-09-28 DIAGNOSIS — Z7951 Long term (current) use of inhaled steroids: Secondary | ICD-10-CM | POA: Insufficient documentation

## 2016-09-28 DIAGNOSIS — J45909 Unspecified asthma, uncomplicated: Secondary | ICD-10-CM | POA: Diagnosis not present

## 2016-09-28 DIAGNOSIS — Z87891 Personal history of nicotine dependence: Secondary | ICD-10-CM | POA: Insufficient documentation

## 2016-09-29 ENCOUNTER — Emergency Department
Admission: EM | Admit: 2016-09-29 | Discharge: 2016-09-29 | Disposition: A | Discharge status: Home / Self Care | Payer: Medicaid Other | Attending: Emergency Medicine | Admitting: Emergency Medicine

## 2016-09-29 ENCOUNTER — Emergency Department: Payer: Medicaid Other

## 2016-09-29 DIAGNOSIS — J441 Chronic obstructive pulmonary disease with (acute) exacerbation: Secondary | ICD-10-CM

## 2016-09-29 DIAGNOSIS — R059 Cough, unspecified: Secondary | ICD-10-CM

## 2016-09-29 DIAGNOSIS — R05 Cough: Secondary | ICD-10-CM

## 2016-09-29 LAB — BASIC METABOLIC PANEL
ANION GAP: 12 (ref 5–15)
BUN: <5 mg/dL — ABNORMAL LOW (ref 6–20)
CHLORIDE: 102 mmol/L (ref 101–111)
CO2: 24 mmol/L (ref 22–32)
Calcium: 8.9 mg/dL (ref 8.9–10.3)
Creatinine, Ser: 0.75 mg/dL (ref 0.61–1.24)
GFR calc non Af Amer: >60 mL/min (ref >60)
Glucose, Bld: 82 mg/dL (ref 65–99)
POTASSIUM: 3.9 mmol/L (ref 3.5–5.1)
Sodium: 138 mmol/L (ref 135–145)

## 2016-09-29 LAB — CBC
HEMATOCRIT: 38.8 % — AB (ref 40.0–52.0)
HEMOGLOBIN: 13.2 g/dL (ref 13.0–18.0)
MCH: 33.4 pg (ref 26.0–34.0)
MCHC: 34 g/dL (ref 32.0–36.0)
MCV: 98.3 fL (ref 80.0–100.0)
Platelets: 290 10*3/uL (ref 150–440)
RBC: 3.94 MIL/uL — AB (ref 4.40–5.90)
RDW: 14.9 % — ABNORMAL HIGH (ref 11.5–14.5)
WBC: 10.1 10*3/uL (ref 3.8–10.6)

## 2016-09-29 LAB — TROPONIN I: Troponin I: <0.03 ng/mL (ref <0.03)

## 2016-09-29 MED ORDER — HYDROCOD POLST-CPM POLST ER 10-8 MG/5ML PO SUER: 5.0000 mL | Freq: Two times a day (BID) | ORAL | 0 refills | Status: DC

## 2016-09-29 MED ORDER — PREDNISONE 20 MG PO TABS: ORAL_TABLET | ORAL | 0 refills | Status: DC

## 2016-09-29 MED ORDER — HYDROCOD POLST-CPM POLST ER 10-8 MG/5ML PO SUER
5.0000 mL | Freq: Once | ORAL | Status: AC
  Administered 2016-09-29: 5 mL via ORAL
  Filled 2016-09-29: qty 5

## 2016-09-29 MED ORDER — IPRATROPIUM-ALBUTEROL 0.5-2.5 (3) MG/3ML IN SOLN
3.0000 mL | Freq: Once | RESPIRATORY_TRACT | Status: AC
  Administered 2016-09-29: 3 mL via RESPIRATORY_TRACT
  Filled 2016-09-29: qty 3

## 2016-09-29 MED ORDER — METHYLPREDNISOLONE SODIUM SUCC 125 MG IJ SOLR
125.0000 mg | Freq: Once | INTRAMUSCULAR | Status: AC
  Administered 2016-09-29: 125 mg via INTRAVENOUS
  Filled 2016-09-29: qty 2

## 2016-09-29 NOTE — ED Notes (Signed)
Patient transported to X-ray 

## 2016-09-29 NOTE — ED Notes (Signed)
AAOx3.  Skin warm and dry.  NAD 

## 2016-09-29 NOTE — Discharge Instructions (Signed)
1. Take steroid as prescribed (prednisone 60 mg daily 4 days). 2. Continue nebulizer treatments every 4 hours as needed for wheezing. 3. You may take cough medicine as needed (Tussionex). 4. Return to the ER for worsening symptoms, persistent vomiting, difficulty breathing or other concerns.

## 2016-09-29 NOTE — ED Triage Notes (Signed)
Pt arrived via EMS from home with c/o shortness of breath; pt quit smoking about 2 weeks ago, )smoked 1/2 ppd x 46 years), and currently lives with heavy smokers; pt was on 2L oxygen via [place], sats 99-100%; EMS reports pt does have home nebulizer machine but has not used as he doesn't know how; pt talking in complete coherent sentences; reports nonproductive cough; pt maintaining sats 95-96% on room air in triage

## 2016-09-29 NOTE — ED Provider Notes (Signed)
[facility] Emergency Department Provider Note   ____________________________________________   First MD Initiated Contact with Patient 09/29/16 [phone]     (approximate)  I have reviewed the triage vital signs and the nursing notes.   HISTORY  Chief Complaint Cough and Shortness of Breath    HPI Caleb Carpenter is a 61 y.o. male brought to the ED from home via EMS with a of cough and shortness of breath. Patient has a history of COPD, alcohol abuse who states he quit smoking 2 weeks ago. Has noticed increase in cough since. Denies associated fever, chills, chest pain, abdominal pain, nausea, vomiting, diarrhea. Denies recent travel or trauma. Nothing makes his symptoms better or worse.   Past Medical History:  Diagnosis Date  . Asthma   . COPD (chronic obstructive pulmonary disease) (HCC)   . Hypertension     Patient Active Problem List   Diagnosis Date Noted  . Dehydration 08/28/2016  . Pneumothorax 08/07/2016  . General weakness 09/03/2015  . Generalized weakness 09/02/2015  . Hyponatremia 09/02/2015  . HTN (hypertension) 09/02/2015  . GERD (gastroesophageal reflux disease) 09/02/2015  . Acute posthemorrhagic anemia 06/23/2015  . Hypomagnesemia 06/23/2015  . Leukocytosis 06/23/2015  . Alcohol abuse 06/23/2015  . Thrombocytopenia (HCC) 06/23/2015  . Upper GI bleed 06/21/2015  . Reflux esophagitis   . Gastritis   . Duodenal ulceration     Past Surgical History:  Procedure Laterality Date  . ESOPHAGOGASTRODUODENOSCOPY (EGD) WITH PROPOFOL N/A 06/21/2015   Procedure: ESOPHAGOGASTRODUODENOSCOPY (EGD) WITH PROPOFOL;  Surgeon: Midge Minium, MD;  Location: [facility] ENDOSCOPY;  Service: Endoscopy;  Laterality: N/A;    Prior to Admission medications   Medication Sig Start Date End Date Taking? Authorizing Provider  albuterol (PROVENTIL HFA;VENTOLIN HFA) 108 (90 Base) MCG/ACT inhaler Inhale 2 puffs into the lungs 2 (two) times daily.    [provider]  alendronate (FOSAMAX) 70 MG tablet Take 70 mg by mouth once a week. Take with a full glass of water on an empty stomach.    [provider]  buPROPion (WELLBUTRIN XL) 150 MG 24 hr tablet Take 150 mg by mouth daily.    [provider]  Cholecalciferol (D 5000 PO) Take 5,000 Units by mouth daily.    [provider]  diltiazem (CARDIZEM CD) 120 MG 24 hr capsule Take 120 mg by mouth daily.    [provider]  donepezil (ARICEPT) 5 MG tablet Take 5 mg by mouth at bedtime.    [provider]  feeding supplement, ENSURE ENLIVE, (ENSURE ENLIVE) LIQD Take 237 mLs by mouth 2 (two) times daily between meals. 08/13/16   Adrian Saran, MD  fluticasone (FLOVENT HFA) 220 MCG/ACT inhaler Inhale 2 puffs into the lungs 2 (two) times daily.    [provider]  Fluticasone-Salmeterol (ADVAIR) 250-50 MCG/DOSE AEPB Inhale 1 puff into the lungs 2 (two) times daily.    [provider]  folic acid (FOLVITE) 1 MG tablet Take 1 mg by mouth daily.     [provider]  mirtazapine (REMERON) 7.5 MG tablet Take 1 tablet (7.5 mg total) by mouth at bedtime. 08/13/16 09/13/16  Adrian Saran, MD  nicotine (NICODERM CQ - DOSED IN MG/24 HOURS) 14 mg/24hr patch Place 1 patch (14 mg total) onto the skin daily. 08/13/16   Adrian Saran, MD  pantoprazole (PROTONIX) 40 MG tablet Take 1 tablet (40 mg total) by mouth 2 (two) times daily. 08/13/16   Adrian Saran, MD  thiamine (VITAMIN B-1)  100 MG tablet Take 100 mg by mouth daily.    [provider]  Tiotropium Bromide-Olodaterol (STIOLTO RESPIMAT) 2.5-2.5 MCG/ACT AERS Inhale 2 puffs into the lungs daily.    [provider]    Allergies Codeine  Family History  Problem Relation Age of Onset  . Diabetes Unknown   . Hypertension Unknown     Social History Social History  Substance Use Topics  . Smoking status: Current Some Day Smoker    Types: Cigarettes    Last attempt to quit: 06/21/2014  .  Smokeless tobacco: Current User    Types: Chew  . Alcohol use 1.2 oz/week    2 Cans of beer per week     Comment: per day    Review of Systems  Constitutional: No fever/chills. Eyes: No visual changes. ENT: No sore throat. Cardiovascular: Denies chest pain. Respiratory: Positive for nonproductive cough and shortness of breath. Gastrointestinal: No abdominal pain.  No nausea, no vomiting.  No diarrhea.  No constipation. Genitourinary: Negative for dysuria. Musculoskeletal: Negative for back pain. Skin: Negative for rash. Neurological: Negative for headaches, focal weakness or numbness.   ____________________________________________   PHYSICAL EXAM:  VITAL SIGNS: ED Triage Vitals  Enc Vitals Group     BP 09/28/16 2354 (!) 151/98     Pulse Rate 09/28/16 2354 93     Resp 09/28/16 2354 20     Temp 09/28/16 2354 98.1 F (36.7 C)     Temp Source 09/28/16 2354 Oral     SpO2 09/28/16 2354 96 %     Weight 09/28/16 2355 90 lb (40.8 kg)     Height 09/28/16 2355 5\' 5"  (1.651 m)     Head Circumference --      Peak Flow --      Pain Score 09/28/16 2354 0     Pain Loc --      Pain Edu? --      Excl. in GC? --     Constitutional: Alert and oriented. Cachectic, disheveled appearing and in no acute distress. Eyes: Conjunctivae are normal. PERRL. EOMI. Head: Atraumatic. Nose: No congestion/rhinnorhea. Mouth/Throat: Mucous membranes are moist.  Oropharynx non-erythematous. Neck: No stridor.  Supple neck without meningismus. Cardiovascular: Normal rate, regular rhythm. Grossly normal heart sounds.  Good peripheral circulation. Respiratory: Normal respiratory effort.  No retractions. Lungs diminished bilaterally. Active dry cough. Gastrointestinal: Soft and nontender. No distention. No abdominal bruits. No CVA tenderness. Musculoskeletal: No lower extremity tenderness nor edema.  No joint effusions. Neurologic:  Normal speech and language. No gross focal neurologic deficits are  appreciated. No gait instability. Skin:  Skin is warm, dry and intact. No rash noted. Psychiatric: Mood and affect are normal. Speech and behavior are normal.  ____________________________________________   LABS (all labs ordered are listed, but only abnormal results are displayed)  Labs Reviewed  BASIC METABOLIC PANEL - Abnormal; Notable for the following:       Result Value   BUN <5 (*)    All other components within normal limits  CBC - Abnormal; Notable for the following:    RBC 3.94 (*)    HCT 38.8 (*)    RDW 14.9 (*)    All other components within normal limits  TROPONIN I   ____________________________________________  EKG  ED ECG REPORT I, SUNG,JADE J, the attending physician, personally viewed and interpreted this ECG.   Date: 09/29/2016  EKG Time: 0003  Rate: 91  Rhythm: normal EKG, normal sinus rhythm  Axis: Normal  Intervals:none  ST&T Change: Nonspecific  ____________________________________________  RADIOLOGY  Dg Chest 2 View  Result Date: 09/29/2016 CLINICAL DATA:  Acute onset of shortness of breath. Initial encounter. EXAM: CHEST  2 VIEW COMPARISON:  Chest radiograph performed 08/27/2016 FINDINGS: The lungs are hyperexpanded, with flattening of the hemidiaphragms, compatible with COPD. Peribronchial thickening is noted. Small bilateral pleural effusions are suggested. There is no evidence of pneumothorax. The heart is normal in size; the mediastinal contour is within normal limits. No acute osseous abnormalities are seen. There is a chronic non healed fracture of the right humeral neck. IMPRESSION: 1. Findings of COPD. 2. Suggestion of small bilateral pleural effusions. Lungs otherwise grossly clear. 3. Chronic non healed fracture of the right humeral neck again noted. Electronically Signed   By: Roanna Raider M.D.   On: 09/29/2016 01:33    ____________________________________________   PROCEDURES  Procedure(s) performed:  None  Procedures  Critical Care performed: No  ____________________________________________   INITIAL IMPRESSION / ASSESSMENT AND PLAN / ED COURSE  Pertinent labs & imaging results that were available during my care of the patient were reviewed by me and considered in my medical decision making (see chart for details).  61 year old male who presents with COPD exacerbation. Laboratory and imaging results are unremarkable. Will initiate IV Solu-Medrol, DuoNeb, Tussionex and reassess. Room air saturations are currently 95%.  Clinical Course as of Sep 29 628  Sun Sep 29, 2016  0516 Aeration improved. Room air saturations 95%. Patient resting. Will reassess.  [JS]  0624 Good aeration. No wheezing. Room air saturations 94-95%. Discussed with patient; will discharge home with prednisone and he will follow up closely with his PCP next week. Strict return precautions given. Patient verbalizes understanding and agrees with plan of care.  [JS]    Clinical Course User Index [JS] Irean Hong, MD     ____________________________________________   FINAL CLINICAL IMPRESSION(S) / ED DIAGNOSES  Final diagnoses:  COPD exacerbation (HCC)  Cough      NEW MEDICATIONS STARTED DURING THIS VISIT:  New Prescriptions   No medications on file     Note:  This document was prepared using Dragon voice recognition software and may include unintentional dictation errors.    Irean Hong, MD 09/29/16 [phone]

## 2016-10-10 ENCOUNTER — Emergency Department: Payer: Medicaid Other

## 2016-10-10 ENCOUNTER — Inpatient Hospital Stay
Admission: EM | Admit: 2016-10-10 | Discharge: 2016-10-13 | DRG: 190 | Disposition: A | Discharge status: Home Health | Payer: Medicaid Other | Attending: Internal Medicine | Admitting: Internal Medicine

## 2016-10-10 DIAGNOSIS — F17220 Nicotine dependence, chewing tobacco, uncomplicated: Secondary | ICD-10-CM | POA: Diagnosis present

## 2016-10-10 DIAGNOSIS — S0181XA Laceration without foreign body of other part of head, initial encounter: Secondary | ICD-10-CM

## 2016-10-10 DIAGNOSIS — F17210 Nicotine dependence, cigarettes, uncomplicated: Secondary | ICD-10-CM | POA: Diagnosis present

## 2016-10-10 DIAGNOSIS — Z681 Body mass index (BMI) 19 or less, adult: Secondary | ICD-10-CM | POA: Diagnosis not present

## 2016-10-10 DIAGNOSIS — I10 Essential (primary) hypertension: Secondary | ICD-10-CM | POA: Diagnosis present

## 2016-10-10 DIAGNOSIS — E876 Hypokalemia: Secondary | ICD-10-CM | POA: Diagnosis present

## 2016-10-10 DIAGNOSIS — Z7983 Long term (current) use of bisphosphonates: Secondary | ICD-10-CM

## 2016-10-10 DIAGNOSIS — Z7951 Long term (current) use of inhaled steroids: Secondary | ICD-10-CM | POA: Diagnosis not present

## 2016-10-10 DIAGNOSIS — E871 Hypo-osmolality and hyponatremia: Secondary | ICD-10-CM | POA: Diagnosis present

## 2016-10-10 DIAGNOSIS — R0602 Shortness of breath: Secondary | ICD-10-CM

## 2016-10-10 DIAGNOSIS — F411 Generalized anxiety disorder: Secondary | ICD-10-CM | POA: Diagnosis present

## 2016-10-10 DIAGNOSIS — I429 Cardiomyopathy, unspecified: Secondary | ICD-10-CM | POA: Diagnosis present

## 2016-10-10 DIAGNOSIS — R7989 Other specified abnormal findings of blood chemistry: Secondary | ICD-10-CM

## 2016-10-10 DIAGNOSIS — I5031 Acute diastolic (congestive) heart failure: Secondary | ICD-10-CM

## 2016-10-10 DIAGNOSIS — I5021 Acute systolic (congestive) heart failure: Secondary | ICD-10-CM

## 2016-10-10 DIAGNOSIS — R55 Syncope and collapse: Secondary | ICD-10-CM | POA: Diagnosis present

## 2016-10-10 DIAGNOSIS — E43 Unspecified severe protein-calorie malnutrition: Secondary | ICD-10-CM | POA: Diagnosis present

## 2016-10-10 DIAGNOSIS — F1010 Alcohol abuse, uncomplicated: Secondary | ICD-10-CM | POA: Diagnosis present

## 2016-10-10 DIAGNOSIS — J189 Pneumonia, unspecified organism: Secondary | ICD-10-CM | POA: Diagnosis present

## 2016-10-10 DIAGNOSIS — W19XXXA Unspecified fall, initial encounter: Secondary | ICD-10-CM | POA: Diagnosis present

## 2016-10-10 DIAGNOSIS — J440 Chronic obstructive pulmonary disease with acute lower respiratory infection: Secondary | ICD-10-CM | POA: Diagnosis present

## 2016-10-10 DIAGNOSIS — Z885 Allergy status to narcotic agent status: Secondary | ICD-10-CM

## 2016-10-10 DIAGNOSIS — R778 Other specified abnormalities of plasma proteins: Secondary | ICD-10-CM | POA: Diagnosis present

## 2016-10-10 DIAGNOSIS — Z79899 Other long term (current) drug therapy: Secondary | ICD-10-CM | POA: Diagnosis not present

## 2016-10-10 LAB — COMPREHENSIVE METABOLIC PANEL
ALBUMIN: 2.4 g/dL — AB (ref 3.5–5.0)
ALT: 7 U/L — ABNORMAL LOW (ref 17–63)
ANION GAP: 8 (ref 5–15)
AST: 18 U/L (ref 15–41)
Alkaline Phosphatase: 62 U/L (ref 38–126)
BILIRUBIN TOTAL: 0.5 mg/dL (ref 0.3–1.2)
BUN: 7 mg/dL (ref 6–20)
CHLORIDE: 92 mmol/L — AB (ref 101–111)
CO2: 28 mmol/L (ref 22–32)
Calcium: 7.8 mg/dL — ABNORMAL LOW (ref 8.9–10.3)
Creatinine, Ser: 0.55 mg/dL — ABNORMAL LOW (ref 0.61–1.24)
GFR calc Af Amer: >60 mL/min (ref >60)
Glucose, Bld: 107 mg/dL — ABNORMAL HIGH (ref 65–99)
Potassium: 3 mmol/L — ABNORMAL LOW (ref 3.5–5.1)
Sodium: 128 mmol/L — ABNORMAL LOW (ref 135–145)
TOTAL PROTEIN: 5.2 g/dL — AB (ref 6.5–8.1)

## 2016-10-10 LAB — URINALYSIS, COMPLETE (UACMP) WITH MICROSCOPIC
BACTERIA UA: NONE SEEN
BILIRUBIN URINE: NEGATIVE
GLUCOSE, UA: NEGATIVE mg/dL
Hgb urine dipstick: NEGATIVE
KETONES UR: NEGATIVE mg/dL
LEUKOCYTES UA: NEGATIVE
NITRITE: NEGATIVE
PH: 6 (ref 5.0–8.0)
PROTEIN: NEGATIVE mg/dL
Specific Gravity, Urine: 1.004 — ABNORMAL LOW (ref 1.005–1.030)
Squamous Epithelial / LPF: NONE SEEN

## 2016-10-10 LAB — CBC WITH DIFFERENTIAL/PLATELET
BASOS ABS: 0.1 10*3/uL (ref 0–0.1)
Basophils Relative: 1 %
Eosinophils Absolute: 0 10*3/uL (ref 0–0.7)
Eosinophils Relative: 1 %
HEMATOCRIT: 31.2 % — AB (ref 40.0–52.0)
HEMOGLOBIN: 10.6 g/dL — AB (ref 13.0–18.0)
LYMPHS PCT: 27 %
Lymphs Abs: 1.3 10*3/uL (ref 1.0–3.6)
MCH: 32.8 pg (ref 26.0–34.0)
MCHC: 33.8 g/dL (ref 32.0–36.0)
MCV: 96.9 fL (ref 80.0–100.0)
Monocytes Absolute: 0.4 10*3/uL (ref 0.2–1.0)
Monocytes Relative: 8 %
NEUTROS ABS: 3.1 10*3/uL (ref 1.4–6.5)
Neutrophils Relative %: 63 %
Platelets: 234 10*3/uL (ref 150–440)
RBC: 3.22 MIL/uL — AB (ref 4.40–5.90)
RDW: 14.4 % (ref 11.5–14.5)
WBC: 4.8 10*3/uL (ref 3.8–10.6)

## 2016-10-10 LAB — TROPONIN I
TROPONIN I: 0.05 ng/mL — AB (ref <0.03)
TROPONIN I: 0.05 ng/mL — AB (ref <0.03)
Troponin I: 0.05 ng/mL (ref <0.03)

## 2016-10-10 LAB — LACTIC ACID, PLASMA: LACTIC ACID, VENOUS: 1.5 mmol/L (ref 0.5–1.9)

## 2016-10-10 LAB — ETHANOL: Alcohol, Ethyl (B): 174 mg/dL — ABNORMAL HIGH (ref <5)

## 2016-10-10 MED ORDER — SODIUM CHLORIDE 0.9 % IV BOLUS (SEPSIS)
1000.0000 mL | Freq: Once | INTRAVENOUS | Status: AC
  Administered 2016-10-10: 1000 mL via INTRAVENOUS

## 2016-10-10 MED ORDER — POTASSIUM CHLORIDE CRYS ER 20 MEQ PO TBCR
40.0000 meq | EXTENDED_RELEASE_TABLET | Freq: Once | ORAL | Status: AC
  Administered 2016-10-10: 40 meq via ORAL
  Filled 2016-10-10: qty 2

## 2016-10-10 MED ORDER — LORAZEPAM 2 MG/ML IJ SOLN
1.0000 mg | Freq: Four times a day (QID) | INTRAMUSCULAR | Status: DC | PRN
  Filled 2016-10-10: qty 1

## 2016-10-10 MED ORDER — FOLIC ACID 1 MG PO TABS
1.0000 mg | ORAL_TABLET | Freq: Every day | ORAL | Status: DC
  Administered 2016-10-10 – 2016-10-13 (×4): 1 mg via ORAL
  Filled 2016-10-10 (×4): qty 1

## 2016-10-10 MED ORDER — FLUTICASONE PROPIONATE 50 MCG/ACT NA SUSP
1.0000 | Freq: Every day | NASAL | Status: DC
  Administered 2016-10-11 – 2016-10-13 (×3): 1 via NASAL
  Filled 2016-10-10: qty 16

## 2016-10-10 MED ORDER — ASPIRIN 325 MG PO TABS
325.0000 mg | ORAL_TABLET | Freq: Once | ORAL | Status: DC
  Filled 2016-10-10: qty 1

## 2016-10-10 MED ORDER — PANTOPRAZOLE SODIUM 40 MG PO TBEC
40.0000 mg | DELAYED_RELEASE_TABLET | Freq: Two times a day (BID) | ORAL | Status: DC
  Administered 2016-10-10 – 2016-10-13 (×6): 40 mg via ORAL
  Filled 2016-10-10 (×6): qty 1

## 2016-10-10 MED ORDER — VITAMIN B-1 100 MG PO TABS
100.0000 mg | ORAL_TABLET | Freq: Every day | ORAL | Status: DC
  Administered 2016-10-10 – 2016-10-13 (×4): 100 mg via ORAL
  Filled 2016-10-10 (×4): qty 1

## 2016-10-10 MED ORDER — ADULT MULTIVITAMIN W/MINERALS CH
1.0000 | ORAL_TABLET | Freq: Every day | ORAL | Status: DC
  Administered 2016-10-11 – 2016-10-13 (×3): 1 via ORAL
  Filled 2016-10-10 (×4): qty 1

## 2016-10-10 MED ORDER — DILTIAZEM HCL ER COATED BEADS 120 MG PO CP24
120.0000 mg | ORAL_CAPSULE | Freq: Every day | ORAL | Status: DC
  Administered 2016-10-11: 12:00:00 120 mg via ORAL
  Filled 2016-10-10 (×2): qty 1

## 2016-10-10 MED ORDER — LEVOFLOXACIN IN D5W 750 MG/150ML IV SOLN
750.0000 mg | Freq: Once | INTRAVENOUS | Status: AC
  Administered 2016-10-10: 750 mg via INTRAVENOUS
  Filled 2016-10-10 (×3): qty 150

## 2016-10-10 MED ORDER — THIAMINE HCL 100 MG/ML IJ SOLN
100.0000 mg | Freq: Every day | INTRAMUSCULAR | Status: DC
  Filled 2016-10-10: qty 1

## 2016-10-10 MED ORDER — IPRATROPIUM-ALBUTEROL 0.5-2.5 (3) MG/3ML IN SOLN
3.0000 mL | Freq: Four times a day (QID) | RESPIRATORY_TRACT | Status: DC
  Administered 2016-10-10 – 2016-10-12 (×5): 3 mL via RESPIRATORY_TRACT
  Filled 2016-10-10 (×7): qty 3

## 2016-10-10 MED ORDER — LIDOCAINE HCL (PF) 1 % IJ SOLN
INTRAMUSCULAR | Status: AC
  Administered 2016-10-10: 10 mL via INTRADERMAL
  Filled 2016-10-10: qty 5

## 2016-10-10 MED ORDER — ENSURE ENLIVE PO LIQD
237.0000 mL | Freq: Two times a day (BID) | ORAL | Status: DC
  Administered 2016-10-11 – 2016-10-13 (×6): 237 mL via ORAL

## 2016-10-10 MED ORDER — POTASSIUM CHLORIDE IN NACL 20-0.9 MEQ/L-% IV SOLN
INTRAVENOUS | Status: DC
  Administered 2016-10-10: 23:00:00 via INTRAVENOUS
  Filled 2016-10-10 (×3): qty 1000

## 2016-10-10 MED ORDER — ACETAMINOPHEN 325 MG PO TABS: 650.0000 mg | ORAL_TABLET | Freq: Four times a day (QID) | ORAL | Status: DC | PRN

## 2016-10-10 MED ORDER — LORAZEPAM 1 MG PO TABS
1.0000 mg | ORAL_TABLET | Freq: Four times a day (QID) | ORAL | Status: DC | PRN
  Administered 2016-10-12: 1 mg via ORAL
  Filled 2016-10-10: qty 1

## 2016-10-10 MED ORDER — LEVOFLOXACIN IN D5W 750 MG/150ML IV SOLN
750.0000 mg | Freq: Every day | INTRAVENOUS | Status: DC
  Filled 2016-10-10 (×2): qty 150

## 2016-10-10 MED ORDER — LIDOCAINE HCL 1 % IJ SOLN
10.0000 mL | Freq: Once | INTRAMUSCULAR | Status: AC
  Administered 2016-10-10: 10 mL via INTRADERMAL
  Filled 2016-10-10: qty 10

## 2016-10-10 MED ORDER — MAGNESIUM SULFATE 2 GM/50ML IV SOLN
2.0000 g | Freq: Once | INTRAVENOUS | Status: AC
  Administered 2016-10-10: 2 g via INTRAVENOUS
  Filled 2016-10-10: qty 50

## 2016-10-10 MED ORDER — ACETAMINOPHEN 650 MG RE SUPP: 650.0000 mg | Freq: Four times a day (QID) | RECTAL | Status: DC | PRN

## 2016-10-10 MED ORDER — ASPIRIN 81 MG PO CHEW
CHEWABLE_TABLET | ORAL | Status: AC
  Administered 2016-10-10: 325 mg
  Filled 2016-10-10: qty 4

## 2016-10-10 NOTE — H&P (Signed)
[facility] [place] [facility]   PATIENT NAME: Caleb Carpenter    MR#:  [id number]  DATE OF BIRTH:  02-05-1956  DATE OF ADMISSION:  10/10/2016  PRIMARY CARE PHYSICIAN: Evelene Croon, MD   REQUESTING/REFERRING PHYSICIAN: Dr Chaney Malling  CHIEF COMPLAINT:   Chief Complaint  Patient presents with  . Loss of Consciousness    HISTORY OF PRESENT ILLNESS:  Caleb Carpenter  is a 61 y.o. male with a known history of Hypertension, COPD presented after a fall and loss of consciousness. He states he was sitting out in the backyard and is going in through the back door and he passed out. He stated he felt dizzy prior to passing out. He also stated he has not been feeling well over the past 2-3 days. Last Wednesday he also blacked out. In the ER, he had a laceration over his right eye and there was blood all over his face and arms. In the ER, he was found to have pneumonia on chest x-ray, high alcohol level, low sodium and low potassium. Hospitalist services were contacted for further evaluation. Patient currently chewing tobacco in the ER.  PAST MEDICAL HISTORY:   Past Medical History:  Diagnosis Date  . Asthma   . COPD (chronic obstructive pulmonary disease) (HCC)   . Hypertension     PAST SURGICAL HISTORY:   Past Surgical History:  Procedure Laterality Date  . ESOPHAGOGASTRODUODENOSCOPY (EGD) WITH PROPOFOL N/A 06/21/2015   Procedure: ESOPHAGOGASTRODUODENOSCOPY (EGD) WITH PROPOFOL;  Surgeon: Midge Minium, MD;  Location: [facility] ENDOSCOPY;  Service: Endoscopy;  Laterality: N/A;    SOCIAL HISTORY:   Social History  Substance Use Topics  . Smoking status: Former Smoker    Types: Cigarettes    Quit date: 06/21/2014  . Smokeless tobacco: Current User    Types: Chew  . Alcohol use 1.2 oz/week    2 Cans of beer per week     Comment: per day    FAMILY HISTORY:   Family History  Problem Relation Age of Onset  . Diabetes Unknown   . Hypertension Unknown     DRUG  ALLERGIES:   Allergies  Allergen Reactions  . Codeine Other (See Comments)    Nasal drip    REVIEW OF SYSTEMS:  CONSTITUTIONAL: No fever. Positive for weakness.  EYES: No blurred or double vision.  EARS, NOSE, AND THROAT: No tinnitus or ear pain. Positive for sore throat. Positive for runny nose. Positive for occasional dysphagia to solids. RESPIRATORY: Positive for cough and shortness of breath. No wheezing or hemoptysis.  CARDIOVASCULAR: No chest pain, orthopnea, edema.  GASTROINTESTINAL: No nausea, vomiting, diarrhea or abdominal pain. No blood in bowel movements GENITOURINARY: No dysuria, hematuria.  ENDOCRINE: No polyuria, nocturia,  HEMATOLOGY: No anemia, easy bruising or bleeding SKIN: No rash or lesion. MUSCULOSKELETAL: No joint pain or arthritis. Positive for soreness in his legs   NEUROLOGIC: No tingling, numbness, weakness.  PSYCHIATRY: No anxiety or depression.   MEDICATIONS AT HOME:   Prior to Admission medications   Medication Sig Start Date End Date Taking? Authorizing Provider  albuterol (PROVENTIL HFA;VENTOLIN HFA) 108 (90 Base) MCG/ACT inhaler Inhale 2 puffs into the lungs 2 (two) times daily.   Yes [provider]  alendronate (FOSAMAX) 70 MG tablet Take 70 mg by mouth once a week. Take with a full glass of water on an empty stomach.   Yes [provider]  buPROPion (WELLBUTRIN XL) 150 MG 24 hr tablet Take 150 mg by mouth  daily.   Yes [provider]  diltiazem (CARDIZEM CD) 120 MG 24 hr capsule Take 120 mg by mouth daily.   Yes [provider]  donepezil (ARICEPT) 5 MG tablet Take 5 mg by mouth at bedtime.   Yes [provider]  fluticasone (FLONASE) 50 MCG/ACT nasal spray Place 1 spray into both nostrils daily.   Yes [provider]  lisinopril (PRINIVIL,ZESTRIL) 20 MG tablet Take 20 mg by mouth daily.   Yes [provider]  meloxicam (MOBIC) 7.5 MG tablet Take 7.5 mg by mouth daily.   Yes [provider]  pantoprazole (PROTONIX) 40 MG tablet Take 1 tablet (40 mg total) by mouth 2 (two) times daily. 08/13/16  Yes Mody, Patricia Pesa, MD  Tiotropium Bromide-Olodaterol (STIOLTO RESPIMAT) 2.5-2.5 MCG/ACT AERS Inhale 2 puffs into the lungs daily.   Yes [provider]  verapamil (VERELAN PM) 120 MG 24 hr capsule Take 120 mg by mouth at bedtime.   Yes [provider]  feeding supplement, ENSURE ENLIVE, (ENSURE ENLIVE) LIQD Take 237 mLs by mouth 2 (two) times daily between meals. 08/13/16   Adrian Saran, MD  mirtazapine (REMERON) 7.5 MG tablet Take 1 tablet (7.5 mg total) by mouth at bedtime. 08/13/16 09/13/16  Adrian Saran, MD      VITAL SIGNS:  Blood pressure 127/90, pulse 91, temperature 98.2 F (36.8 C), temperature source Oral, resp. rate 15, height 5\' 5"  (1.651 m), weight 42.2 kg (93 lb), SpO2 98 %.  PHYSICAL EXAMINATION:  GENERAL:  61 y.o.-year-old patient lying in the bed with no acute distress.  EYES: Pupils equal, round, reactive to light and accommodation. No scleral icterus. Extraocular muscles intact.  HEENT: Head atraumatic, normocephalic. Oropharynx and nasopharynx clear.  NECK:  Supple, no jugular venous distention. No thyroid enlargement, no tenderness.  LUNGS: Normal breath sounds bilaterally, no wheezing, rales,rhonchi or crepitation. No use of accessory muscles of respiration.  CARDIOVASCULAR: S1, S2 normal. No murmurs, rubs, or gallops.  ABDOMEN: Soft, nontender, nondistended. Bowel sounds present. No organomegaly or mass.  EXTREMITIES: No pedal edema, cyanosis, or clubbing.  NEUROLOGIC: Cranial nerves II through XII are intact. Muscle strength 5/5 in all extremities. Sensation intact. Gait not checked.  PSYCHIATRIC: The patient is alert and oriented x 3.  SKIN: No rash, lesion, or ulcer.   LABORATORY PANEL:   CBC  Recent Labs Lab 10/10/16 1318  WBC 4.8  HGB 10.6*  HCT 31.2*  PLT 234    ------------------------------------------------------------------------------------------------------------------  Chemistries   Recent Labs Lab 10/10/16 1318  NA 128*  K 3.0*  CL 92*  CO2 28  GLUCOSE 107*  BUN 7  CREATININE 0.55*  CALCIUM 7.8*  AST 18  ALT 7*  ALKPHOS 62  BILITOT 0.5   ------------------------------------------------------------------------------------------------------------------  Cardiac Enzymes  Recent Labs Lab 10/10/16 1318  TROPONINI 0.05*   ------------------------------------------------------------------------------------------------------------------  RADIOLOGY:  Dg Chest 2 View  Result Date: 10/10/2016 CLINICAL DATA:  Syncope. EXAM: CHEST  2 VIEW COMPARISON:  September 29, 2016 FINDINGS: Bilateral pleural effusions are a little larger in the interval. This may explain the increasing opacity in the lateral right lung base. There is obscuration of the right heart border as well as increased opacity over the heart on the lateral view. Mild increase lung markings in the bases bilaterally. A nodule over the right base is consistent with a nipple shadow, not seen previously. The cardiomediastinal silhouette is stable. No suspicious masses. No overt edema. IMPRESSION: 1. Small bilateral pleural effusions are larger in the interval.  2. Increasing opacity in the right middle lobe may represent developing infiltrate. Recommend follow-up to resolution. 3. No other acute abnormalities. Electronically Signed   By: Gerome Sam III M.D   On: 10/10/2016 14:54   Dg Pelvis 1-2 Views  Result Date: 10/10/2016 CLINICAL DATA:  Fall with pelvic pain.  Initial encounter. EXAM: PELVIS - 1-2 VIEW COMPARISON:  09/26/2015 radiographs FINDINGS: Irregularity of the right femur greater trochanter is suspicious for fracture. The left hip is unremarkable. No focal bony lesions are present. IMPRESSION: Question fracture of the right femur greater trochanter- recommend dedicated  images of the right hip as clinically indicated. Electronically Signed   By: Harmon Pier M.D.   On: 10/10/2016 14:54   Ct Head Wo Contrast  Result Date: 10/10/2016 CLINICAL DATA:  Fall with laceration to the forehead.  Dizziness. EXAM: CT HEAD WITHOUT CONTRAST CT CERVICAL SPINE WITHOUT CONTRAST TECHNIQUE: Multidetector CT imaging of the head and cervical spine was performed following the standard protocol without intravenous contrast. Multiplanar CT image reconstructions of the cervical spine were also generated. COMPARISON:  08/07/2016 FINDINGS: CT HEAD FINDINGS Brain: Moderate generalized atrophy. Mild chronic small-vessel ischemic change of the hemispheric white matter. No sign of acute infarction, mass lesion, hemorrhage, hydrocephalus or extra-axial collection. Vascular: There is atherosclerotic calcification of the major vessels at the base of the brain. Skull: Negative Sinuses/Orbits: Clear/normal Other: None significant CT CERVICAL SPINE FINDINGS Alignment: Normal Skull base and vertebrae: No fracture Soft tissues and spinal canal: Negative Disc levels: Facet arthropathy on the right at C3-4 and C4-5. Mild degenerative spondylosis from C3-4 through C7-T1. No significant bony foraminal stenosis. Upper chest: Negative Other: None IMPRESSION: Head CT:  Atrophy.  Chronic small-vessel ischemic change. Cervical spine CT: Chronic degenerative changes. No acute or traumatic finding. Electronically Signed   By: Paulina Fusi M.D.   On: 10/10/2016 14:30   Ct Cervical Spine Wo Contrast  Result Date: 10/10/2016 CLINICAL DATA:  Fall with laceration to the forehead.  Dizziness. EXAM: CT HEAD WITHOUT CONTRAST CT CERVICAL SPINE WITHOUT CONTRAST TECHNIQUE: Multidetector CT imaging of the head and cervical spine was performed following the standard protocol without intravenous contrast. Multiplanar CT image reconstructions of the cervical spine were also generated. COMPARISON:  08/07/2016 FINDINGS: CT HEAD FINDINGS  Brain: Moderate generalized atrophy. Mild chronic small-vessel ischemic change of the hemispheric white matter. No sign of acute infarction, mass lesion, hemorrhage, hydrocephalus or extra-axial collection. Vascular: There is atherosclerotic calcification of the major vessels at the base of the brain. Skull: Negative Sinuses/Orbits: Clear/normal Other: None significant CT CERVICAL SPINE FINDINGS Alignment: Normal Skull base and vertebrae: No fracture Soft tissues and spinal canal: Negative Disc levels: Facet arthropathy on the right at C3-4 and C4-5. Mild degenerative spondylosis from C3-4 through C7-T1. No significant bony foraminal stenosis. Upper chest: Negative Other: None IMPRESSION: Head CT:  Atrophy.  Chronic small-vessel ischemic change. Cervical spine CT: Chronic degenerative changes. No acute or traumatic finding. Electronically Signed   By: Paulina Fusi M.D.   On: 10/10/2016 14:30   Dg Femur Min 2 Views Right  Result Date: 10/10/2016 CLINICAL DATA:  Fall.  Right leg pain. EXAM: RIGHT FEMUR 2 VIEWS COMPARISON:  No recent. FINDINGS: Degenerative changes right hip and knee. Diffuse osteopenia. No evidence of fracture dislocation. Peripheral vascular calcification. IMPRESSION: 1. Degenerative changes right hip and knee. Diffuse osteopenia. No acute abnormality. 2. Peripheral vascular disease. Electronically Signed   By: Maisie Fus  Register   On: 10/10/2016 15:40    EKG:  Normal sinus rhythm 89 bpm  IMPRESSION AND PLAN:   1. Pneumonia. With history of alcohol and syncope this could be an aspiration. I will cover with Levaquin which would cover community acquired and aspiration. 2. Hyponatremia. This is likely secondary to alcohol. Hydrate with normal saline 3. Hypokalemia. Replace magnesium IV and check a magnesium tomorrow morning and potassium orally and IV. 4. Syncope. Could be secondary to alcohol or vasovagal. We'll get physical therapy consultation. Monitor on telemetry. Check an  echocardiogram and carotid ultrasound. 5. Elevated troponin likely demand ischemia. Check a few more cardiac enzymes. Patient given aspirin in the emergency room 6. Essential hypertension restart verapamil. 7. Patient ran out of most of his other medications at this point 8. Alcohol abuse. Patient states he never gets shaky. I will start thiamine. Put on [place] protocol    All the records are reviewed and case discussed with ED provider. Management plans discussed with the patient, family and they are in agreement.  CODE STATUS: full code  TOTAL TIME TAKING CARE OF THIS PATIENT: 50 minutes.    Alford Highland M.D on 10/10/2016 at 5:09 PM  Between 7am to 6pm - Pager - [phone]  After 6pm call admission pager [phone]  [facility]  [phone]  CC: Primary care physician; Evelene Croon, MD

## 2016-10-10 NOTE — ED Provider Notes (Signed)
[facility]-EMERGENCY DEPARTMENT Provider Note   CSN: [id number] Arrival date & time: 10/10/16  1310     History   Chief Complaint Chief Complaint  Patient presents with  . Loss of Consciousness    HPI Caleb Carpenter is a 61 y.o. male history of COPD, hypertension, chronic alcohol abuse here presenting with fall, syncope. Patient states that he only drinks a little bit of beer every day and last drink was earlier today. He states that he was under a tree and he passed out and did not remember how he fell. Of note, patient had multiple episodes of syncope in the last several months and was thought to be from dehydration and hyponatremia from alcohol. Despite that, patient still has been drinking alcohol every day. Patient states that he got tetanus shot recently. He was brought by EMS and was noted to be borderline hypotensive and had laceration to the right forehead.   The history is provided by the patient.    Past Medical History:  Diagnosis Date  . Asthma   . COPD (chronic obstructive pulmonary disease) (HCC)   . Hypertension     Patient Active Problem List   Diagnosis Date Noted  . Dehydration 08/28/2016  . Pneumothorax 08/07/2016  . General weakness 09/03/2015  . Generalized weakness 09/02/2015  . Hyponatremia 09/02/2015  . HTN (hypertension) 09/02/2015  . GERD (gastroesophageal reflux disease) 09/02/2015  . Acute posthemorrhagic anemia 06/23/2015  . Hypomagnesemia 06/23/2015  . Leukocytosis 06/23/2015  . Alcohol abuse 06/23/2015  . Thrombocytopenia (HCC) 06/23/2015  . Upper GI bleed 06/21/2015  . Reflux esophagitis   . Gastritis   . Duodenal ulceration     Past Surgical History:  Procedure Laterality Date  . ESOPHAGOGASTRODUODENOSCOPY (EGD) WITH PROPOFOL N/A 06/21/2015   Procedure: ESOPHAGOGASTRODUODENOSCOPY (EGD) WITH PROPOFOL;  Surgeon: Midge Minium, MD;  Location: [facility] ENDOSCOPY;  Service: Endoscopy;  Laterality: N/A;       Home Medications    Prior to  Admission medications   Medication Sig Start Date End Date Taking? Authorizing Provider  albuterol (PROVENTIL HFA;VENTOLIN HFA) 108 (90 Base) MCG/ACT inhaler Inhale 2 puffs into the lungs 2 (two) times daily.   Yes [provider]  alendronate (FOSAMAX) 70 MG tablet Take 70 mg by mouth once a week. Take with a full glass of water on an empty stomach.   Yes [provider]  buPROPion (WELLBUTRIN XL) 150 MG 24 hr tablet Take 150 mg by mouth daily.   Yes [provider]  diltiazem (CARDIZEM CD) 120 MG 24 hr capsule Take 120 mg by mouth daily.   Yes [provider]  donepezil (ARICEPT) 5 MG tablet Take 5 mg by mouth at bedtime.   Yes [provider]  fluticasone (FLONASE) 50 MCG/ACT nasal spray Place 1 spray into both nostrils daily.   Yes [provider]  lisinopril (PRINIVIL,ZESTRIL) 20 MG tablet Take 20 mg by mouth daily.   Yes [provider]  meloxicam (MOBIC) 7.5 MG tablet Take 7.5 mg by mouth daily.   Yes [provider]  pantoprazole (PROTONIX) 40 MG tablet Take 1 tablet (40 mg total) by mouth 2 (two) times daily. 08/13/16  Yes Mody, Patricia Pesa, MD  Tiotropium Bromide-Olodaterol (STIOLTO RESPIMAT) 2.5-2.5 MCG/ACT AERS Inhale 2 puffs into the lungs daily.   Yes [provider]  verapamil (VERELAN PM) 120 MG 24 hr capsule Take 120 mg by mouth at bedtime.   Yes [provider]  chlorpheniramine-HYDROcodone Stevphen Meuse PENNKINETIC ER) 10-8 MG/5ML SUER Take  5 mLs by mouth 2 (two) times daily. Patient not taking: Reported on 10/10/2016 09/29/16   Irean Hong, MD  feeding supplement, ENSURE ENLIVE, (ENSURE ENLIVE) LIQD Take 237 mLs by mouth 2 (two) times daily between meals. 08/13/16   Adrian Saran, MD  mirtazapine (REMERON) 7.5 MG tablet Take 1 tablet (7.5 mg total) by mouth at bedtime. 08/13/16 09/13/16  Adrian Saran, MD  nicotine (NICODERM CQ - DOSED IN MG/24 HOURS) 14 mg/24hr patch Place 1 patch (14 mg total) onto the skin  daily. Patient not taking: Reported on 10/10/2016 08/13/16   Adrian Saran, MD    Family History Family History  Problem Relation Age of Onset  . Diabetes Unknown   . Hypertension Unknown     Social History Social History  Substance Use Topics  . Smoking status: Current Some Day Smoker    Types: Cigarettes    Last attempt to quit: 06/21/2014  . Smokeless tobacco: Current User    Types: Chew  . Alcohol use 1.2 oz/week    2 Cans of beer per week     Comment: per day     Allergies   Codeine   Review of Systems Review of Systems  Skin: Positive for wound.  All other systems reviewed and are negative.    Physical Exam Updated Vital Signs BP 122/84   Pulse 87   Temp 98.2 F (36.8 C) (Oral)   Resp 17   Ht 5\' 5"  (1.651 m)   Wt 42.2 kg (93 lb)   SpO2 100%   BMI 15.48 kg/m   Physical Exam  Constitutional:  Intoxicated, slightly dehydrated   HENT:  Head: Normocephalic.  5 cm laceration above R eyebrow   Eyes: EOM are normal. Pupils are equal, round, and reactive to light.  Neck: Normal range of motion. Neck supple.  Cardiovascular: Normal rate, regular rhythm and normal heart sounds.   Pulmonary/Chest: Effort normal and breath sounds normal. No respiratory distress. He has no wheezes.  Abdominal: Soft. Bowel sounds are normal. He exhibits no distension. There is no tenderness.  Musculoskeletal: Normal range of motion.  Pelvis stable. No midline spinal tenderness, no obvious extremity trauma   Neurological: He is alert.  Intoxicated. Nl strength throughout. CN 2-12 intact   Skin: Skin is warm.  Psychiatric:  Unable   Nursing note and vitals reviewed.    ED Treatments / Results  Labs (all labs ordered are listed, but only abnormal results are displayed) Labs Reviewed  URINALYSIS, COMPLETE (UACMP) WITH MICROSCOPIC - Abnormal; Notable for the following:       Result Value   Color, Urine YELLOW (*)    APPearance CLEAR (*)    Specific Gravity, Urine 1.004 (*)      All other components within normal limits  COMPREHENSIVE METABOLIC PANEL - Abnormal; Notable for the following:    Sodium 128 (*)    Potassium 3.0 (*)    Chloride 92 (*)    Glucose, Bld 107 (*)    Creatinine, Ser 0.55 (*)    Calcium 7.8 (*)    Total Protein 5.2 (*)    Albumin 2.4 (*)    ALT 7 (*)    All other components within normal limits  CBC WITH DIFFERENTIAL/PLATELET - Abnormal; Notable for the following:    RBC 3.22 (*)    Hemoglobin 10.6 (*)    HCT 31.2 (*)    All other components within normal limits  TROPONIN I - Abnormal; Notable for the following:  Troponin I 0.05 (*)    All other components within normal limits  ETHANOL - Abnormal; Notable for the following:    Alcohol, Ethyl (B) 174 (*)    All other components within normal limits  CULTURE, BLOOD (ROUTINE X 2)  CULTURE, BLOOD (ROUTINE X 2)  LACTIC ACID, PLASMA  CBG MONITORING, ED    EKG  EKG Interpretation  Date/Time:  Thursday October 10 2016 13:15:43 EDT Ventricular Rate:  89 PR Interval:    QRS Duration: 98 QT Interval:  393 QTC Calculation: 479 R Axis:   79 Text Interpretation:  Sinus rhythm Probable anterior infarct, age indeterminate Abnormal T, consider ischemia, inferior leads TWI new since previous  Confirmed by Richardean Canal ([id number]) on 10/10/2016 1:23:45 PM       Radiology Dg Chest 2 View  Result Date: 10/10/2016 CLINICAL DATA:  Syncope. EXAM: CHEST  2 VIEW COMPARISON:  September 29, 2016 FINDINGS: Bilateral pleural effusions are a little larger in the interval. This may explain the increasing opacity in the lateral right lung base. There is obscuration of the right heart border as well as increased opacity over the heart on the lateral view. Mild increase lung markings in the bases bilaterally. A nodule over the right base is consistent with a nipple shadow, not seen previously. The cardiomediastinal silhouette is stable. No suspicious masses. No overt edema. IMPRESSION: 1. Small bilateral pleural  effusions are larger in the interval. 2. Increasing opacity in the right middle lobe may represent developing infiltrate. Recommend follow-up to resolution. 3. No other acute abnormalities. Electronically Signed   By: Gerome Sam III M.D   On: 10/10/2016 14:54   Dg Pelvis 1-2 Views  Result Date: 10/10/2016 CLINICAL DATA:  Fall with pelvic pain.  Initial encounter. EXAM: PELVIS - 1-2 VIEW COMPARISON:  09/26/2015 radiographs FINDINGS: Irregularity of the right femur greater trochanter is suspicious for fracture. The left hip is unremarkable. No focal bony lesions are present. IMPRESSION: Question fracture of the right femur greater trochanter- recommend dedicated images of the right hip as clinically indicated. Electronically Signed   By: Harmon Pier M.D.   On: 10/10/2016 14:54   Ct Head Wo Contrast  Result Date: 10/10/2016 CLINICAL DATA:  Fall with laceration to the forehead.  Dizziness. EXAM: CT HEAD WITHOUT CONTRAST CT CERVICAL SPINE WITHOUT CONTRAST TECHNIQUE: Multidetector CT imaging of the head and cervical spine was performed following the standard protocol without intravenous contrast. Multiplanar CT image reconstructions of the cervical spine were also generated. COMPARISON:  08/07/2016 FINDINGS: CT HEAD FINDINGS Brain: Moderate generalized atrophy. Mild chronic small-vessel ischemic change of the hemispheric white matter. No sign of acute infarction, mass lesion, hemorrhage, hydrocephalus or extra-axial collection. Vascular: There is atherosclerotic calcification of the major vessels at the base of the brain. Skull: Negative Sinuses/Orbits: Clear/normal Other: None significant CT CERVICAL SPINE FINDINGS Alignment: Normal Skull base and vertebrae: No fracture Soft tissues and spinal canal: Negative Disc levels: Facet arthropathy on the right at C3-4 and C4-5. Mild degenerative spondylosis from C3-4 through C7-T1. No significant bony foraminal stenosis. Upper chest: Negative Other: None IMPRESSION:  Head CT:  Atrophy.  Chronic small-vessel ischemic change. Cervical spine CT: Chronic degenerative changes. No acute or traumatic finding. Electronically Signed   By: Paulina Fusi M.D.   On: 10/10/2016 14:30   Ct Cervical Spine Wo Contrast  Result Date: 10/10/2016 CLINICAL DATA:  Fall with laceration to the forehead.  Dizziness. EXAM: CT HEAD WITHOUT CONTRAST CT CERVICAL SPINE WITHOUT CONTRAST TECHNIQUE: Multidetector  CT imaging of the head and cervical spine was performed following the standard protocol without intravenous contrast. Multiplanar CT image reconstructions of the cervical spine were also generated. COMPARISON:  08/07/2016 FINDINGS: CT HEAD FINDINGS Brain: Moderate generalized atrophy. Mild chronic small-vessel ischemic change of the hemispheric white matter. No sign of acute infarction, mass lesion, hemorrhage, hydrocephalus or extra-axial collection. Vascular: There is atherosclerotic calcification of the major vessels at the base of the brain. Skull: Negative Sinuses/Orbits: Clear/normal Other: None significant CT CERVICAL SPINE FINDINGS Alignment: Normal Skull base and vertebrae: No fracture Soft tissues and spinal canal: Negative Disc levels: Facet arthropathy on the right at C3-4 and C4-5. Mild degenerative spondylosis from C3-4 through C7-T1. No significant bony foraminal stenosis. Upper chest: Negative Other: None IMPRESSION: Head CT:  Atrophy.  Chronic small-vessel ischemic change. Cervical spine CT: Chronic degenerative changes. No acute or traumatic finding. Electronically Signed   By: Paulina Fusi M.D.   On: 10/10/2016 14:30   Dg Femur Min 2 Views Right  Result Date: 10/10/2016 CLINICAL DATA:  Fall.  Right leg pain. EXAM: RIGHT FEMUR 2 VIEWS COMPARISON:  No recent. FINDINGS: Degenerative changes right hip and knee. Diffuse osteopenia. No evidence of fracture dislocation. Peripheral vascular calcification. IMPRESSION: 1. Degenerative changes right hip and knee. Diffuse osteopenia. No  acute abnormality. 2. Peripheral vascular disease. Electronically Signed   By: Maisie Fus  Register   On: 10/10/2016 15:40    Procedures Procedures (including critical care time)  LACERATION REPAIR Performed by: Richardean Canal Authorized by: Richardean Canal Consent: Verbal consent obtained. Risks and benefits: risks, benefits and alternatives were discussed Consent given by: patient Patient identity confirmed: provided demographic data Prepped and Draped in normal sterile fashion Wound explored  Laceration Location: R forehead  Laceration Length: 5 cm  No Foreign Bodies seen or palpated  Anesthesia: local infiltration  Local anesthetic: lidocaine 2 % no epinephrine  Anesthetic total: 10 ml  Irrigation method: syringe Amount of cleaning: standard  Skin closure: 6-0 ethilon  Number of sutures: 3   Technique: simple interrupted   Patient tolerance: Patient tolerated the procedure well with no immediate complications.   Medications Ordered in ED Medications  sodium chloride 0.9 % bolus 1,000 mL (0 mLs Intravenous Stopped 10/10/16 1514)  lidocaine (XYLOCAINE) 1 % (with pres) injection 10 mL (10 mLs Intradermal Given 10/10/16 1606)  potassium chloride SA (K-DUR,KLOR-CON) CR tablet 40 mEq (40 mEq Oral Given 10/10/16 1524)     Initial Impression / Assessment and Plan / ED Course  I have reviewed the triage vital signs and the nursing notes.  Pertinent labs & imaging results that were available during my care of the patient were reviewed by me and considered in my medical decision making (see chart for details).     Caleb Carpenter is a 61 y.o. male here with alcohol intoxication, possible syncope. Borderline hypotensive and syncope likely from orthostasis vs dehydration vs alcohol intoxication. Will check labs, CT head/neck, xrays. Will hydrate and reassess.   4:19 PM Patient's Trop 0.05. There is TWI that is new since previous. CXR showed possible pneumonia. But WBC nl, Lactate nl. I  think likely aspiration event and no actual aspiration pneumonia. Cultures sent, hold abx for now. Laceration sutured and will need suture removal in a week. Hospitalist to admit for syncope, elevated trop, EKG changes.    Final Clinical Impressions(s) / ED Diagnoses   Final diagnoses:  None    New Prescriptions New Prescriptions   No medications on file  Charlynne Pander, MD 10/10/16 [phone]

## 2016-10-10 NOTE — ED Notes (Signed)
MD. Wieting at bedside  

## 2016-10-10 NOTE — Progress Notes (Signed)
Pharmacy Antibiotic Note  Caleb Carpenter is a 61 y.o. male admitted on 10/10/2016 with pneumonia.  Pharmacy has been consulted for levofloxacin dosing.  Plan: Levofloxacin 750 mg IV daily  Height: 5\' 5"  (165.1 cm) Weight: 93 lb 4.8 oz (42.3 kg) IBW/kg (Calculated) : 61.5  Temp (24hrs), Avg:98 F (36.7 C), Min:97.8 F (36.6 C), Max:98.2 F (36.8 C)   Recent Labs Lab 10/10/16 1318 10/10/16 1507  WBC 4.8  --   CREATININE 0.55*  --   LATICACIDVEN  --  1.5    Estimated Creatinine Clearance: 57.3 mL/min (A) (by C-G formula based on SCr of 0.55 mg/dL (L)).    Allergies  Allergen Reactions  . Codeine Other (See Comments)    Nasal drip    Antimicrobials this admission: levofloxacin 6/28 >>   Dose adjustments this admission:  Microbiology results: 6/28 BCx: Sent 6/28 Sputum: Sent   Thank you for allowing pharmacy to be a part of this patient's care.  Cindi Carbon, PharmD Clinical Pharmacist 10/10/2016 7:17 PM

## 2016-10-10 NOTE — ED Notes (Signed)
Pt requesting to use his chewing tobacco. MD Silverio Lay informed, stated that pt could use his chewing tobacco

## 2016-10-10 NOTE — ED Triage Notes (Signed)
Pt bib EMS from home w/ c/o syncopal episode. Pt sts that he has been feeling "bad" this past week, feeling dizzy. Pt sts that he "fell out" this Wednesday. Pt sts that he drank "one beer" today. Pts breath smells of ETOH.  Pt alert, oriented to self and situation.  NAD. Laceration to R forehead, bleeding controlled.  CBG 114 EMS

## 2016-10-10 NOTE — ED Notes (Signed)
Bed alarm placed on pt. Pt verbalized understanding to hit call bell before attempting to get up

## 2016-10-10 NOTE — ED Notes (Signed)
Attempted to call report

## 2016-10-10 NOTE — ED Notes (Signed)
MD Silverio Lay at bedside for stiches to laceration

## 2016-10-11 ENCOUNTER — Inpatient Hospital Stay: Payer: Medicaid Other

## 2016-10-11 ENCOUNTER — Inpatient Hospital Stay (HOSPITAL_COMMUNITY)
Admit: 2016-10-11 | Discharge: 2016-10-11 | Disposition: A | Discharge status: Home / Self Care | Payer: Medicaid Other | Attending: Internal Medicine | Admitting: Internal Medicine

## 2016-10-11 DIAGNOSIS — R55 Syncope and collapse: Secondary | ICD-10-CM

## 2016-10-11 LAB — BASIC METABOLIC PANEL
Anion gap: 6 (ref 5–15)
BUN: 8 mg/dL (ref 6–20)
CHLORIDE: 100 mmol/L — AB (ref 101–111)
CO2: 27 mmol/L (ref 22–32)
CREATININE: 0.61 mg/dL (ref 0.61–1.24)
Calcium: 7.6 mg/dL — ABNORMAL LOW (ref 8.9–10.3)
GFR calc non Af Amer: >60 mL/min (ref >60)
Glucose, Bld: 112 mg/dL — ABNORMAL HIGH (ref 65–99)
Potassium: 4.2 mmol/L (ref 3.5–5.1)
SODIUM: 133 mmol/L — AB (ref 135–145)

## 2016-10-11 LAB — BLOOD CULTURE ID PANEL (REFLEXED)
Acinetobacter baumannii: NOT DETECTED
CANDIDA TROPICALIS: NOT DETECTED
Candida albicans: NOT DETECTED
Candida glabrata: NOT DETECTED
Candida krusei: NOT DETECTED
Candida parapsilosis: NOT DETECTED
ENTEROBACTERIACEAE SPECIES: NOT DETECTED
Enterobacter cloacae complex: NOT DETECTED
Enterococcus species: NOT DETECTED
Escherichia coli: NOT DETECTED
HAEMOPHILUS INFLUENZAE: NOT DETECTED
KLEBSIELLA PNEUMONIAE: NOT DETECTED
Klebsiella oxytoca: NOT DETECTED
Listeria monocytogenes: NOT DETECTED
METHICILLIN RESISTANCE: NOT DETECTED
NEISSERIA MENINGITIDIS: NOT DETECTED
PROTEUS SPECIES: NOT DETECTED
Pseudomonas aeruginosa: NOT DETECTED
STAPHYLOCOCCUS SPECIES: DETECTED — AB
STREPTOCOCCUS SPECIES: NOT DETECTED
Serratia marcescens: NOT DETECTED
Staphylococcus aureus (BCID): NOT DETECTED
Streptococcus agalactiae: NOT DETECTED
Streptococcus pneumoniae: NOT DETECTED
Streptococcus pyogenes: NOT DETECTED

## 2016-10-11 LAB — LIPID PANEL
CHOLESTEROL: 114 mg/dL (ref 0–200)
HDL: 37 mg/dL — ABNORMAL LOW (ref >40)
LDL Cholesterol: 65 mg/dL (ref 0–99)
Total CHOL/HDL Ratio: 3.1 RATIO
Triglycerides: 58 mg/dL (ref <150)
VLDL: 12 mg/dL (ref 0–40)

## 2016-10-11 LAB — ECHOCARDIOGRAM COMPLETE
Height: 65 in
WEIGHTICAEL: 1492.8 [oz_av]

## 2016-10-11 LAB — CBC
HCT: 32.2 % — ABNORMAL LOW (ref 40.0–52.0)
Hemoglobin: 10.9 g/dL — ABNORMAL LOW (ref 13.0–18.0)
MCH: 32.7 pg (ref 26.0–34.0)
MCHC: 33.7 g/dL (ref 32.0–36.0)
MCV: 96.9 fL (ref 80.0–100.0)
PLATELETS: 244 10*3/uL (ref 150–440)
RBC: 3.32 MIL/uL — AB (ref 4.40–5.90)
RDW: 14.2 % (ref 11.5–14.5)
WBC: 4.6 10*3/uL (ref 3.8–10.6)

## 2016-10-11 LAB — PHOSPHORUS: PHOSPHORUS: 2.9 mg/dL (ref 2.5–4.6)

## 2016-10-11 LAB — MAGNESIUM: MAGNESIUM: 1.6 mg/dL — AB (ref 1.7–2.4)

## 2016-10-11 MED ORDER — LORAZEPAM 2 MG/ML IJ SOLN
1.0000 mg | Freq: Once | INTRAMUSCULAR | Status: AC
  Administered 2016-10-11: 1 mg via INTRAVENOUS

## 2016-10-11 MED ORDER — TIOTROPIUM BROMIDE MONOHYDRATE 18 MCG IN CAPS
18.0000 ug | ORAL_CAPSULE | Freq: Every day | RESPIRATORY_TRACT | Status: DC
  Administered 2016-10-12 – 2016-10-13 (×2): 18 ug via RESPIRATORY_TRACT
  Filled 2016-10-11: qty 5

## 2016-10-11 MED ORDER — ROSUVASTATIN CALCIUM 10 MG PO TABS
10.0000 mg | ORAL_TABLET | Freq: Every day | ORAL | Status: DC
  Administered 2016-10-11 – 2016-10-13 (×3): 10 mg via ORAL
  Filled 2016-10-11 (×3): qty 1

## 2016-10-11 MED ORDER — ENOXAPARIN SODIUM 30 MG/0.3ML ~~LOC~~ SOLN
30.0000 mg | SUBCUTANEOUS | Status: DC
  Administered 2016-10-11 – 2016-10-12 (×2): 30 mg via SUBCUTANEOUS
  Filled 2016-10-11 (×2): qty 0.3

## 2016-10-11 MED ORDER — CARVEDILOL 3.125 MG PO TABS
3.1250 mg | ORAL_TABLET | Freq: Two times a day (BID) | ORAL | Status: DC
  Administered 2016-10-12 – 2016-10-13 (×3): 3.125 mg via ORAL
  Filled 2016-10-11 (×3): qty 1

## 2016-10-11 MED ORDER — DEXTROSE 5 % IV SOLN
1.0000 g | INTRAVENOUS | Status: DC
  Administered 2016-10-11: 1 g via INTRAVENOUS
  Filled 2016-10-11: qty 10

## 2016-10-11 MED ORDER — MAGNESIUM SULFATE IN D5W 1-5 GM/100ML-% IV SOLN
1.0000 g | Freq: Once | INTRAVENOUS | Status: AC
  Administered 2016-10-11: 1 g via INTRAVENOUS
  Filled 2016-10-11: qty 100

## 2016-10-11 MED ORDER — ASPIRIN 81 MG PO CHEW
81.0000 mg | CHEWABLE_TABLET | Freq: Every day | ORAL | Status: DC
Start: 2016-10-12 — End: 2016-10-13
  Administered 2016-10-12 – 2016-10-13 (×2): 81 mg via ORAL
  Filled 2016-10-11 (×2): qty 1

## 2016-10-11 MED ORDER — IPRATROPIUM-ALBUTEROL 0.5-2.5 (3) MG/3ML IN SOLN
3.0000 mL | RESPIRATORY_TRACT | Status: DC | PRN
  Administered 2016-10-11 – 2016-10-12 (×2): 3 mL via RESPIRATORY_TRACT
  Filled 2016-10-11: qty 3

## 2016-10-11 MED ORDER — METOPROLOL TARTRATE 5 MG/5ML IV SOLN
5.0000 mg | Freq: Once | INTRAVENOUS | Status: AC
  Administered 2016-10-11: 19:00:00 5 mg via INTRAVENOUS
  Filled 2016-10-11: qty 5

## 2016-10-11 MED ORDER — DILTIAZEM LOAD VIA INFUSION: 5.0000 mg | Freq: Once | INTRAVENOUS | Status: DC

## 2016-10-11 MED ORDER — METHYLPREDNISOLONE SODIUM SUCC 40 MG IJ SOLR
40.0000 mg | Freq: Two times a day (BID) | INTRAMUSCULAR | Status: DC
  Administered 2016-10-11 – 2016-10-12 (×2): 40 mg via INTRAVENOUS
  Filled 2016-10-11 (×2): qty 1

## 2016-10-11 MED ORDER — LEVOFLOXACIN 750 MG PO TABS
750.0000 mg | ORAL_TABLET | Freq: Every day | ORAL | Status: DC
  Administered 2016-10-11: 750 mg via ORAL
  Filled 2016-10-11: qty 1

## 2016-10-11 NOTE — Care Management (Signed)
Discharged from [facility] 08/28/16. Given taxi voucher to the [employer] and resources that are available in Center per Clinical Social Worker Dede Query. Gwenette Greet RN MSN CCM Care Management [phone]

## 2016-10-11 NOTE — Progress Notes (Signed)
Initial Nutrition Assessment  DOCUMENTATION CODES:   Severe malnutrition in context of social or environmental circumstances, Underweight  INTERVENTION:  Will update diet order to reflect that patient will need minced meats with gravy.   Continue Ensure Enlive po BID, each supplement provides 350 kcal and 20 grams of protein.   Continue daily MVI, folate, and thiamine. As patient has a hx of EtOH abuse and is at risk for Wernicke's Encephalopathy, recommend providing thiamine in IV form as he will have limited absorption of PO form.  Monitor magnesium, potassium, and phosphorus daily for at least 3 days, MD to replete as needed, as pt is at risk for refeeding syndrome given severe malnutrition, hx of EtOH abuse. Discussed with Dr. Juliene Pina.   Provided patient with handout on food and nutrition resources in [employer].   NUTRITION DIAGNOSIS:   Malnutrition (Severe) related to social / environmental circumstances (increased needs d/t COPD, EtOH abuse, limited access to food) as evidenced by 13.9 percent weight loss over 1 month, severe depletion of body fat, severe depletion of muscle mass.  GOAL:   Patient will meet greater than or equal to 90% of their needs  MONITOR:   PO intake, Supplement acceptance, Labs, Weight trends, I & O's  REASON FOR ASSESSMENT:   Malnutrition Screening Tool    ASSESSMENT:   61 year old male with PMHx of HTN, asthma, COPD, EtOH abuse who presents after fall and syncope, found to have PNA.   -Per blood toxicology, patient came in with ethyl alcohol level of 174 mg/dL.  Spoke with patient at bedside. He is known to this RD from several previous admission. He reports his appetite has been "okay" at home and he is eating about the same. Has been eating one meal per day prepared by brother. Will have meat or vegetables. He reports he does not always even finish this meal - sometimes finishes only 50%. After education several admissions ago patient had  incorporated some high-calorie, high-protein snacks at home. He reports he is no longer doing this because his medical bills are expensive. Reports he used to get "food stamps" in [place], but has not looked into it here. He continues to take multivitamin at home.   Patient's UBW was 115 lbs. On 08/28/2016 patient was 108.3 lbs on 08/28/2016 and has lost 15 lbs (13.9% body weight) over the past month, which is significant for time frame.  Meal Completion: 100% of breakfast this morning (403 kcal and 18 grams of protein)  Medications reviewed and include: folic acid 1 mg daily, MVI daily, pantoprazole, thiamine 100 mg daily PO, NS with KCl 20 mEq/L @ 50 ml/hr.  Labs reviewed: Sodium 133, Chloride 100, elevated Troponin.  Nutrition-Focused physical exam completed. Findings are severe fat depletion, severe muscle depletion, and no edema. Patient is edentulous. Reports his dentures are at home but he does not use them anyway.   Discussed with RN.  Diet Order:  Diet regular Room service appropriate? Yes; Fluid consistency: Thin  Skin:  Wound (see comment) (multiple abrasions/skin tears after fall)  Last BM:  10/10/2016  Height:   Ht Readings from Last 1 Encounters:  10/10/16 5\' 5"  (1.651 m)    Weight:   Wt Readings from Last 1 Encounters:  10/10/16 93 lb 4.8 oz (42.3 kg)    Ideal Body Weight:  61.8 kg  BMI:  Body mass index is 15.53 kg/m.  Estimated Nutritional Needs:   Kcal:  1500-1730 (MSJ x 1.3-1.5)  Protein:  65-85  grams (1.5-2 grams/kg)  Fluid:  1.2-1.5 L/day (30-35 ml/kg)  EDUCATION NEEDS:   Education needs addressed  Helane Rima, MS, RD, LDN Pager: [phone] After Hours Pager: [phone]

## 2016-10-11 NOTE — Evaluation (Signed)
Clinical/Bedside Swallow Evaluation Patient Details  Name: Caleb Carpenter MRN: [id number] Date of Birth: [birth year]  Today's Date: 10/11/2016 Time: SLP Start Time (ACUTE ONLY): 1300 SLP Stop Time (ACUTE ONLY): 1353 SLP Time Calculation (min) (ACUTE ONLY): 53 min  Past Medical History:  Past Medical History:  Diagnosis Date  . Asthma   . COPD (chronic obstructive pulmonary disease) (HCC)   . Hypertension    Past Surgical History:  Past Surgical History:  Procedure Laterality Date  . ESOPHAGOGASTRODUODENOSCOPY (EGD) WITH PROPOFOL N/A 06/21/2015   Procedure: ESOPHAGOGASTRODUODENOSCOPY (EGD) WITH PROPOFOL;  Surgeon: Midge Minium, MD;  Location: [facility] ENDOSCOPY;  Service: Endoscopy;  Laterality: N/A;      Assessment / Plan / Recommendation Clinical Impression: Pt presents with immmediate over s/s of aspiration inconsistently with thin liquids by straw only. Oral mech exam revealed structures to be functioning adequately with no apparent weakness. Pt is notably thin and frail, reports he doesnt eat much, only snacks. Pt has dentures at home but does not wear them. today, Pt was able to masticate soft solids but needed extended time. Pt eventually cleared oral cavity adequately of all boluses. Immediate coughing noted after thin liquids by straw which Pt. reports happens frequently. It is positive to note that Pt tolerated all thin liquids by cup without difficulty today. Needed cues to take smaller sips and slow down. Vocal quality remained clear and laryngeal elevation appeared adequate,. rec downgrading diet to Dys 3 with chopped meats. Will continue with thin liquids for now and encourage no straws, small single sips.  F/U with toleration of diet and adjust as needed.  SLP Visit Diagnosis: Dysphagia, oropharyngeal phase (R13.12)    HPI:    Caleb Carpenter  is a 61 y.o. male with a known history of Hypertension, COPD presented after a fall and loss of consciousness. He states he was sitting out in the  backyard and is going in through the back door and he passed out. He stated he felt dizzy prior to passing out. He also stated he has not been feeling well over the past 2-3 days    Aspiration Risk  Mild aspiration risk    Diet Recommendation Dysphagia 3 (Mech soft)   Liquid Administration via: Cup;No straw Medication Administration: Whole meds with puree Supervision: Intermittent supervision to cue for compensatory strategies Compensations: Slow rate;Small sips/bites Postural Changes: Seated upright at 90 degrees;Remain upright for at least 30 minutes after po intake    Other  Recommendations     Follow up Recommendations   F/u with toleration of diet. Educate on aspiration precautions.     Frequency and Duration min 2x/week  1 week       Prognosis Prognosis for Safe Diet Advancement: Fair      Swallow Study   General Type of Study: Bedside Swallow Evaluation Diet Prior to this Study: Regular Temperature Spikes Noted: No Respiratory Status: Room air History of Recent Intubation: No Behavior/Cognition: Alert;Cooperative;Pleasant mood Oral Cavity Assessment: Within Functional Limits Oral Care Completed by SLP: No Oral Cavity - Dentition: Edentulous;Other (Comment) (reports he has dentures but does not wear them) Vision: Functional for self-feeding Self-Feeding Abilities: Able to feed self Patient Positioning: Upright in bed Baseline Vocal Quality: Normal Volitional Cough: Strong Volitional Swallow: Able to elicit    Oral/Motor/Sensory Function Overall Oral Motor/Sensory Function: Within functional limits   Ice Chips Ice chips: Not tested   Thin Liquid Thin Liquid: Impaired Presentation: Straw;Cup Pharyngeal  Phase Impairments: Cough - Immediate (coughing with thin by straw)  Nectar Thick     Honey Thick     Puree Puree: Within functional limits Presentation: Self Fed   Solid   GO   Solid: Impaired Presentation: Self Fed Oral Phase Impairments: Impaired  mastication Oral Phase Functional Implications: Impaired mastication;Prolonged oral transit        Eather Colas 10/11/2016,1:53 PM

## 2016-10-11 NOTE — Progress Notes (Signed)
Cardiazem 5 mg IV not given , pharmacy reports the hospital is out.

## 2016-10-11 NOTE — Evaluation (Signed)
Physical Therapy Evaluation Patient Details Name: Caleb Carpenter MRN: [id number] DOB: [birth year] Today's Date: 10/11/2016   History of Present Illness  presented to ER secondary to fall with + LOC; admitted with RML PNA, hyponatremia and hypokalemia (both now replaced)  Clinical Impression  Upon evaluation, patient alert and oriented to basic information; follows commands and demonstrates fair insight/safety awareness.  Bilat UE shoulder elevation with chronic ROM limitations (R UE sore from fall, imaging negative for acute injury); otherwise, bilat UE/LE strength and ROM grossly WFL for basic transfers and mobility.  Demonstrates ability to complete bed mobility with mod indep; sit/stand, basic transfers and gait (50-150') without assist device, cga/min asist, and with RW, cga/close sup.  Decreased dynamic balance reactions evident; heavy reliance on LE step strategy for balance recovery.  Do recommend continued use of RW for all mobility at this time. Mod SOB noted with exertion with associated desat to 86% upon return to room (RA); recovers to >90% within 2-3 minutes of seated rest and pursed lip breathing.  RN informed/aware. Would benefit from skilled PT to address above deficits and promote optimal return to PLOF; Recommend transition to HHPT upon discharge from acute hospitalization.     Follow Up Recommendations Home health PT    Equipment Recommendations  Rolling walker with 5" wheels    Recommendations for Other Services       Precautions / Restrictions Precautions Precautions: Fall Precaution Comments: CIWA Restrictions Weight Bearing Restrictions: No      Mobility  Bed Mobility Overal bed mobility: Modified Independent                Transfers Overall transfer level: Independent   Transfers: Sit to/from Stand Sit to Stand: Supervision;Min guard         General transfer comment: requires use of bilat UEs for optimal safety  Ambulation/Gait Ambulation/Gait  assistance: Min guard;Min assist Ambulation Distance (Feet): 50 Feet Assistive device: None       General Gait Details: mildly antalgic with fair trunk rotation and arm swing; decreased balance reactions, relying on LE step strategy for recovery  Stairs            Wheelchair Mobility    Modified Rankin (Stroke Patients Only)       Balance Overall balance assessment: Needs assistance Sitting-balance support: No upper extremity supported;Feet supported Sitting balance-Leahy Scale: Good     Standing balance support: No upper extremity supported Standing balance-Leahy Scale: Fair                               Pertinent Vitals/Pain Pain Assessment: No/denies pain    Home Living Family/patient expects to be discharged to:: Private residence Living Arrangements: Other (Comment) (brother) Available Help at Discharge: Family;Available 24 hours/day Type of Home: House Home Access: Stairs to enter Entrance Stairs-Rails: None Entrance Stairs-Number of Steps: 3 Home Layout: Two level;Able to live on main level with bedroom/bathroom Home Equipment: Dan Humphreys - 2 wheels      Prior Function Level of Independence: Independent         Comments: Ind Amb without AD limited community distances with 1-2 fall in the last 12 months, Ind with ADLs     Hand Dominance        Extremity/Trunk Assessment   Upper Extremity Assessment Upper Extremity Assessment: Overall WFL for tasks assessed    Lower Extremity Assessment Lower Extremity Assessment: Overall WFL for tasks assessed (grossly at least 4 to  4+/5 throughout)       Communication   Communication: No difficulties  Cognition Arousal/Alertness: Awake/alert Behavior During Therapy: WFL for tasks assessed/performed Overall Cognitive Status: Within Functional Limits for tasks assessed                                        General Comments      Exercises Other Exercises Other Exercises:  150' with RW, cga/close sup-improved gait symmetry, fluidity and overall gait performance with use of RW; also assists with energy conservation and breath control   Assessment/Plan    PT Assessment Patient needs continued PT services  PT Problem List Decreased range of motion;Decreased activity tolerance;Decreased strength;Decreased balance;Decreased mobility;Decreased cognition;Decreased safety awareness;Decreased knowledge of precautions;Cardiopulmonary status limiting activity       PT Treatment Interventions DME instruction;Stair training;Functional mobility training;Gait training;Therapeutic activities;Therapeutic exercise;Balance training;Patient/family education    PT Goals (Current goals can be found in the Care Plan section)  Acute Rehab PT Goals Patient Stated Goal: to return home PT Goal Formulation: With patient Time For Goal Achievement: 10/25/16 Potential to Achieve Goals: Good    Frequency Min 2X/week   Barriers to discharge        Co-evaluation               AM-PAC PT "6 Clicks" Daily Activity  Outcome Measure Difficulty turning over in bed (including adjusting bedclothes, sheets and blankets)?: None Difficulty moving from lying on back to sitting on the side of the bed? : None Difficulty sitting down on and standing up from a chair with arms (e.g., wheelchair, bedside commode, etc,.)?: A Little Help needed moving to and from a bed to chair (including a wheelchair)?: A Little Help needed walking in hospital room?: A Little Help needed climbing 3-5 steps with a railing? : A Little 6 Click Score: 20    End of Session Equipment Utilized During Treatment: Gait belt Activity Tolerance: Patient tolerated treatment well Patient left: in chair;with chair alarm set;with call bell/phone within reach Nurse Communication: Mobility status PT Visit Diagnosis: Muscle weakness (generalized) (M62.81);Difficulty in walking, not elsewhere classified (R26.2)    Time:  7579-7282 PT Time Calculation (min) (ACUTE ONLY): 17 min   Charges:   PT Evaluation $PT Eval Low Complexity: 1 Procedure PT Treatments $Gait Training: 8-22 mins   PT G Codes:        Leighana Neyman H. Manson Passey, PT, DPT, NCS 10/11/16, 4:30 PM [phone]

## 2016-10-11 NOTE — Progress Notes (Signed)
*  PRELIMINARY RESULTS* Echocardiogram 2D Echocardiogram has been performed.  Caleb Carpenter 10/11/2016, 9:12 AM

## 2016-10-11 NOTE — Clinical Social Work Note (Signed)
CSW received referral for SNF, PT is recommending home health.  CSW to sign off please re-consult if social work needs arise.  Ervin Knack. Quron Ruddy, MSW, [employer] [phone]

## 2016-10-11 NOTE — Progress Notes (Signed)
(684Festu 963Tonny Bollman<BADTEXTTTorLe yne [id number] GEast Missoulaadalajara59562ic i GParrishaReMalaAubur BilberryTE43XTTAKentuckyG>adalaTonI74Lev443-8[facility]74-5866eta Lindenf [place]95TorLewayne BuntingG>G>AG>Festus BarrAutoZone

## 2016-10-11 NOTE — Progress Notes (Signed)
Dr Elisabeth Pigeon notified that patient has heart rate of 136 and was put on 3 L of oxygen. MD ordered cardizem IV 5MG  once and duo neb every 4 hours as needed.

## 2016-10-11 NOTE — Plan of Care (Signed)
Problem: Education: Goal: Knowledge of [facility] Education information/materials will improve Outcome: Progressing Pt likes to be called Caleb Carpenter  Past Medical History:  Diagnosis Date  . Asthma   . COPD (chronic obstructive pulmonary disease) (HCC)   . Hypertension    Pt is well controlled with home medications

## 2016-10-11 NOTE — Progress Notes (Signed)
[facility] [place] [facility]   PATIENT NAME: Caleb Carpenter    MR#:  [id number]  DATE OF BIRTH:  1955/09/11  SUBJECTIVE:   patient seen in ultrasound reports that he fell outside Drank only 1 beer at the time Denies CP or SOB  REVIEW OF SYSTEMS:    Review of Systems  Constitutional: Negative for fever, chills weight loss HENT: Negative for ear pain, nosebleeds, congestion, facial swelling, rhinorrhea, neck pain, neck stiffness and ear discharge.   Respiratory: Negative for cough, shortness of breath, wheezing  Cardiovascular: Negative for chest pain, palpitations and leg swelling.  Gastrointestinal: Negative for heartburn, abdominal pain, vomiting, diarrhea or consitpation Genitourinary: Negative for dysuria, urgency, frequency, hematuria Musculoskeletal: Negative for back pain or joint pain Generalized weakness Neurological: Negative for dizziness, seizures, syncope, focal weakness,  numbness and headaches.  Hematological: Does not bruise/bleed easily.  Psychiatric/Behavioral: Negative for hallucinations, confusion, dysphoric mood    Tolerating Diet: yes      DRUG ALLERGIES:   Allergies  Allergen Reactions  . Codeine Other (See Comments)    Nasal drip    VITALS:  Blood pressure 130/80, pulse (!) 102, temperature 98.2 F (36.8 C), temperature source Oral, resp. rate 20, height 5\' 5"  (1.651 m), weight 42.3 kg (93 lb 4.8 oz), SpO2 95 %.  PHYSICAL EXAMINATION:  Constitutional: Appears cahcectic thin frail HENT: Normocephalic. Marland Kitchen Oropharynx is clear and moist.  Eyes: Conjunctivae and EOM are normal. PERRLA, no scleral icterus.  Neck: Normal ROM. Neck supple. No JVD. No tracheal deviation. CVS: RRR, S1/S2 +, no murmurs, no gallops, no carotid bruit.  Pulmonary: Effort and breath sounds normal, no stridor, rhonchi, wheezes, rales.  Abdominal: Soft. BS +,  no distension, tenderness, rebound or guarding.  Musculoskeletal: Normal range of motion. No  edema and no tenderness.  Neuro: Alert. CN 2-12 grossly intact. No focal deficits. Skin: right side of face bruising Psychiatric: Normal mood and affect.      LABORATORY PANEL:   CBC  Recent Labs Lab 10/11/16 0334  WBC 4.6  HGB 10.9*  HCT 32.2*  PLT 244   ------------------------------------------------------------------------------------------------------------------  Chemistries   Recent Labs Lab 10/10/16 1318 10/11/16 0334  NA 128* 133*  K 3.0* 4.2  CL 92* 100*  CO2 28 27  GLUCOSE 107* 112*  BUN 7 8  CREATININE 0.55* 0.61  CALCIUM 7.8* 7.6*  AST 18  --   ALT 7*  --   ALKPHOS 62  --   BILITOT 0.5  --    ------------------------------------------------------------------------------------------------------------------  Cardiac Enzymes  Recent Labs Lab 10/10/16 1318 10/10/16 1858 10/10/16 2104  TROPONINI 0.05* 0.05* 0.05*   ------------------------------------------------------------------------------------------------------------------  RADIOLOGY:  Dg Chest 2 View  Result Date: 10/10/2016 CLINICAL DATA:  Syncope. EXAM: CHEST  2 VIEW COMPARISON:  September 29, 2016 FINDINGS: Bilateral pleural effusions are a little larger in the interval. This may explain the increasing opacity in the lateral right lung base. There is obscuration of the right heart border as well as increased opacity over the heart on the lateral view. Mild increase lung markings in the bases bilaterally. A nodule over the right base is consistent with a nipple shadow, not seen previously. The cardiomediastinal silhouette is stable. No suspicious masses. No overt edema. IMPRESSION: 1. Small bilateral pleural effusions are larger in the interval. 2. Increasing opacity in the right middle lobe may represent developing infiltrate. Recommend follow-up to resolution. 3. No other acute abnormalities. Electronically Signed   By: Gerome Sam III M.D  On: 10/10/2016 14:54   Dg Pelvis 1-2  Views  Result Date: 10/10/2016 CLINICAL DATA:  Fall with pelvic pain.  Initial encounter. EXAM: PELVIS - 1-2 VIEW COMPARISON:  09/26/2015 radiographs FINDINGS: Irregularity of the right femur greater trochanter is suspicious for fracture. The left hip is unremarkable. No focal bony lesions are present. IMPRESSION: Question fracture of the right femur greater trochanter- recommend dedicated images of the right hip as clinically indicated. Electronically Signed   By: Harmon Pier M.D.   On: 10/10/2016 14:54   Ct Head Wo Contrast  Result Date: 10/10/2016 CLINICAL DATA:  Fall with laceration to the forehead.  Dizziness. EXAM: CT HEAD WITHOUT CONTRAST CT CERVICAL SPINE WITHOUT CONTRAST TECHNIQUE: Multidetector CT imaging of the head and cervical spine was performed following the standard protocol without intravenous contrast. Multiplanar CT image reconstructions of the cervical spine were also generated. COMPARISON:  08/07/2016 FINDINGS: CT HEAD FINDINGS Brain: Moderate generalized atrophy. Mild chronic small-vessel ischemic change of the hemispheric white matter. No sign of acute infarction, mass lesion, hemorrhage, hydrocephalus or extra-axial collection. Vascular: There is atherosclerotic calcification of the major vessels at the base of the brain. Skull: Negative Sinuses/Orbits: Clear/normal Other: None significant CT CERVICAL SPINE FINDINGS Alignment: Normal Skull base and vertebrae: No fracture Soft tissues and spinal canal: Negative Disc levels: Facet arthropathy on the right at C3-4 and C4-5. Mild degenerative spondylosis from C3-4 through C7-T1. No significant bony foraminal stenosis. Upper chest: Negative Other: None IMPRESSION: Head CT:  Atrophy.  Chronic small-vessel ischemic change. Cervical spine CT: Chronic degenerative changes. No acute or traumatic finding. Electronically Signed   By: Paulina Fusi M.D.   On: 10/10/2016 14:30   Ct Cervical Spine Wo Contrast  Result Date: 10/10/2016 CLINICAL DATA:   Fall with laceration to the forehead.  Dizziness. EXAM: CT HEAD WITHOUT CONTRAST CT CERVICAL SPINE WITHOUT CONTRAST TECHNIQUE: Multidetector CT imaging of the head and cervical spine was performed following the standard protocol without intravenous contrast. Multiplanar CT image reconstructions of the cervical spine were also generated. COMPARISON:  08/07/2016 FINDINGS: CT HEAD FINDINGS Brain: Moderate generalized atrophy. Mild chronic small-vessel ischemic change of the hemispheric white matter. No sign of acute infarction, mass lesion, hemorrhage, hydrocephalus or extra-axial collection. Vascular: There is atherosclerotic calcification of the major vessels at the base of the brain. Skull: Negative Sinuses/Orbits: Clear/normal Other: None significant CT CERVICAL SPINE FINDINGS Alignment: Normal Skull base and vertebrae: No fracture Soft tissues and spinal canal: Negative Disc levels: Facet arthropathy on the right at C3-4 and C4-5. Mild degenerative spondylosis from C3-4 through C7-T1. No significant bony foraminal stenosis. Upper chest: Negative Other: None IMPRESSION: Head CT:  Atrophy.  Chronic small-vessel ischemic change. Cervical spine CT: Chronic degenerative changes. No acute or traumatic finding. Electronically Signed   By: Paulina Fusi M.D.   On: 10/10/2016 14:30   Dg Femur Min 2 Views Right  Result Date: 10/10/2016 CLINICAL DATA:  Fall.  Right leg pain. EXAM: RIGHT FEMUR 2 VIEWS COMPARISON:  No recent. FINDINGS: Degenerative changes right hip and knee. Diffuse osteopenia. No evidence of fracture dislocation. Peripheral vascular calcification. IMPRESSION: 1. Degenerative changes right hip and knee. Diffuse osteopenia. No acute abnormality. 2. Peripheral vascular disease. Electronically Signed   By: Maisie Fus  Register   On: 10/10/2016 15:40     ASSESSMENT AND PLAN:   61 y.o. male history of COPD, hypertension, chronic alcohol abuse here presenting with fall and syncope.   1. RML PNA: Continue  Levaquin Speech consult for aspiration  2.  Hyponatremia from poor po intake Improved with IVF  3. Hypokalemia: From poor po intake repleted Will watch electrolytes closely for refeeding syndrome  4. Syncope: troponins flat rules out for NSTEMI Follow up ECHO and cardotids PT eval  5. Essential HTN: Continue Diltiazem 6. EtOh : CIWA  7. COPD: continue NEBS   Management plans discussed with the patient and he is in agreement.  CODE STATUS: full  TOTAL TIME TAKING CARE OF THIS PATIENT: 30 minutes.     POSSIBLE D/C 1-2 days, DEPENDING ON CLINICAL CONDITION.   Aquila Menzie M.D on 10/11/2016 at 11:03 AM  Between 7am to 6pm - Pager - [phone] After 6pm go to [email or web address] - password [employer]  [facility]  Office  [phone]  CC: Primary care physician; Evelene Croon, MD  Note: This dictation was prepared with Dragon dictation along with smaller phrase technology. Any transcriptional errors that result from this process are unintentional.

## 2016-10-11 NOTE — Progress Notes (Signed)
PHARMACY - PHYSICIAN COMMUNICATION CRITICAL VALUE ALERT - BLOOD CULTURE IDENTIFICATION (BCID)  Results for orders placed or performed during the hospital encounter of 10/10/16  Blood Culture ID Panel (Reflexed) (Collected: 10/10/2016  3:07 PM)  Result Value Ref Range   Enterococcus species NOT DETECTED NOT DETECTED   Listeria monocytogenes NOT DETECTED NOT DETECTED   Staphylococcus species DETECTED (A) NOT DETECTED   Staphylococcus aureus NOT DETECTED NOT DETECTED   Methicillin resistance NOT DETECTED NOT DETECTED   Streptococcus species NOT DETECTED NOT DETECTED   Streptococcus agalactiae NOT DETECTED NOT DETECTED   Streptococcus pneumoniae NOT DETECTED NOT DETECTED   Streptococcus pyogenes NOT DETECTED NOT DETECTED   Acinetobacter baumannii NOT DETECTED NOT DETECTED   Enterobacteriaceae species NOT DETECTED NOT DETECTED   Enterobacter cloacae complex NOT DETECTED NOT DETECTED   Escherichia coli NOT DETECTED NOT DETECTED   Klebsiella oxytoca NOT DETECTED NOT DETECTED   Klebsiella pneumoniae NOT DETECTED NOT DETECTED   Proteus species NOT DETECTED NOT DETECTED   Serratia marcescens NOT DETECTED NOT DETECTED   Haemophilus influenzae NOT DETECTED NOT DETECTED   Neisseria meningitidis NOT DETECTED NOT DETECTED   Pseudomonas aeruginosa NOT DETECTED NOT DETECTED   Candida albicans NOT DETECTED NOT DETECTED   Candida glabrata NOT DETECTED NOT DETECTED   Candida krusei NOT DETECTED NOT DETECTED   Candida parapsilosis NOT DETECTED NOT DETECTED   Candida tropicalis NOT DETECTED NOT DETECTED    Name of physician (or Provider) Contacted: Vachhani  Changes to prescribed antibiotics required: discontinue Levaquin and add Ceftriaxone.  Clovia Cuff, PharmD, BCPS 10/11/2016 10:02 PM

## 2016-10-11 NOTE — Progress Notes (Signed)
Dr Elisabeth Pigeon notified that hospital was out of cardizedm. MD ordered metoprolol 5mg  IV once.

## 2016-10-12 LAB — PHOSPHORUS
Phosphorus: 1.9 mg/dL — ABNORMAL LOW (ref 2.5–4.6)
Phosphorus: 3.3 mg/dL (ref 2.5–4.6)

## 2016-10-12 LAB — BASIC METABOLIC PANEL
ANION GAP: 4 — AB (ref 5–15)
BUN: 12 mg/dL (ref 6–20)
CHLORIDE: 97 mmol/L — AB (ref 101–111)
CO2: 29 mmol/L (ref 22–32)
Calcium: 7.9 mg/dL — ABNORMAL LOW (ref 8.9–10.3)
Creatinine, Ser: 0.61 mg/dL (ref 0.61–1.24)
GFR calc non Af Amer: >60 mL/min (ref >60)
Glucose, Bld: 229 mg/dL — ABNORMAL HIGH (ref 65–99)
POTASSIUM: 4.6 mmol/L (ref 3.5–5.1)
SODIUM: 130 mmol/L — AB (ref 135–145)

## 2016-10-12 LAB — CBC
HCT: 30.6 % — ABNORMAL LOW (ref 40.0–52.0)
Hemoglobin: 10.3 g/dL — ABNORMAL LOW (ref 13.0–18.0)
MCH: 32.9 pg (ref 26.0–34.0)
MCHC: 33.7 g/dL (ref 32.0–36.0)
MCV: 97.6 fL (ref 80.0–100.0)
PLATELETS: 302 10*3/uL (ref 150–440)
RBC: 3.13 MIL/uL — AB (ref 4.40–5.90)
RDW: 14.3 % (ref 11.5–14.5)
WBC: 3.4 10*3/uL — ABNORMAL LOW (ref 3.8–10.6)

## 2016-10-12 LAB — BRAIN NATRIURETIC PEPTIDE: B Natriuretic Peptide: 2100 pg/mL — ABNORMAL HIGH (ref 0.0–100.0)

## 2016-10-12 LAB — MAGNESIUM
Magnesium: 1.7 mg/dL (ref 1.7–2.4)
Magnesium: 1.9 mg/dL (ref 1.7–2.4)

## 2016-10-12 MED ORDER — SODIUM PHOSPHATES 45 MMOLE/15ML IV SOLN
15.0000 mmol | Freq: Once | INTRAVENOUS | Status: AC
  Administered 2016-10-12: 11:00:00 15 mmol via INTRAVENOUS
  Filled 2016-10-12: qty 5

## 2016-10-12 MED ORDER — MAGNESIUM SULFATE 2 GM/50ML IV SOLN
2.0000 g | Freq: Once | INTRAVENOUS | Status: AC
  Administered 2016-10-12: 2 g via INTRAVENOUS
  Filled 2016-10-12: qty 50

## 2016-10-12 MED ORDER — ALPRAZOLAM 0.5 MG PO TABS
0.2500 mg | ORAL_TABLET | Freq: Two times a day (BID) | ORAL | Status: DC | PRN
  Administered 2016-10-12 – 2016-10-13 (×2): 0.25 mg via ORAL
  Filled 2016-10-12 (×2): qty 1

## 2016-10-12 MED ORDER — FUROSEMIDE 10 MG/ML IJ SOLN
20.0000 mg | Freq: Once | INTRAMUSCULAR | Status: AC
  Administered 2016-10-12: 08:00:00 20 mg via INTRAVENOUS
  Filled 2016-10-12: qty 2

## 2016-10-12 MED ORDER — PREDNISONE 50 MG PO TABS
50.0000 mg | ORAL_TABLET | Freq: Every day | ORAL | Status: DC
  Administered 2016-10-13: 09:00:00 50 mg via ORAL
  Filled 2016-10-12: qty 1

## 2016-10-12 NOTE — Progress Notes (Signed)
[employer] [facility] [place] [facility]   PATIENT NAME: Caleb Carpenter    MR#:  [id number]  DATE OF BIRTH:  1955/11/05  SUBJECTIVE:   Patient's heart rate was elevated yesterday. Patient had anxiety attack. Rapid response was called as per nursing this morning. Patient is denying chest pain, palpitations or shortness of breath  REVIEW OF SYSTEMS:    Review of Systems  Constitutional: Negative for fever, chills weight loss HENT: Negative for ear pain, nosebleeds, congestion, facial swelling, rhinorrhea, neck pain, neck stiffness and ear discharge.   Respiratory: Negative for cough, shortness of breath, wheezing  Cardiovascular: Negative for chest pain, palpitations and leg swelling.  Gastrointestinal: Negative for heartburn, abdominal pain, vomiting, diarrhea or consitpation Genitourinary: Negative for dysuria, urgency, frequency, hematuria Musculoskeletal: Negative for back pain or joint pain Generalized weakness Neurological: Negative for dizziness, seizures, syncope, focal weakness,  numbness and headaches.  Hematological: Does not bruise/bleed easily.  Psychiatric/Behavioral: Negative for hallucinations, confusion, dysphoric mood    Tolerating Diet: yes      DRUG ALLERGIES:   Allergies  Allergen Reactions  . Codeine Other (See Comments)    Nasal drip    VITALS:  Blood pressure 98/69, pulse 90, temperature 97.7 F (36.5 C), temperature source Oral, resp. rate 20, height 5\' 5"  (1.651 m), weight 42.3 kg (93 lb 4.8 oz), SpO2 95 %.  PHYSICAL EXAMINATION:  Constitutional: Appears cahcectic thin frail HENT: Normocephalic. Marland Kitchen Oropharynx is clear and moist.  Eyes: Conjunctivae and EOM are normal. PERRLA, no scleral icterus.  Neck: Normal ROM. Neck supple. No JVD. No tracheal deviation. CVS: RRR, S1/S2 +, no murmurs, no gallops, no carotid bruit.  Pulmonary: Effort and breath sounds normal, no stridor, rhonchi, wheezes, rales.  Abdominal: Soft. BS +,  no  distension, tenderness, rebound or guarding.  Musculoskeletal: Normal range of motion. No edema and no tenderness.  Neuro: Alert. CN 2-12 grossly intact. No focal deficits. Skin: right side of face bruising Psychiatric: Normal mood and affect.      LABORATORY PANEL:   CBC  Recent Labs Lab 10/12/16 0453  WBC 3.4*  HGB 10.3*  HCT 30.6*  PLT 302   ------------------------------------------------------------------------------------------------------------------  Chemistries   Recent Labs Lab 10/10/16 1318  10/12/16 0453  NA 128*  < > 130*  K 3.0*  < > 4.6  CL 92*  < > 97*  CO2 28  < > 29  GLUCOSE 107*  < > 229*  BUN 7  < > 12  CREATININE 0.55*  < > 0.61  CALCIUM 7.8*  < > 7.9*  MG  --   < > 1.7  AST 18  --   --   ALT 7*  --   --   ALKPHOS 62  --   --   BILITOT 0.5  --   --   < > = values in this interval not displayed. ------------------------------------------------------------------------------------------------------------------  Cardiac Enzymes  Recent Labs Lab 10/10/16 1318 10/10/16 1858 10/10/16 2104  TROPONINI 0.05* 0.05* 0.05*   ------------------------------------------------------------------------------------------------------------------  RADIOLOGY:  Dg Chest 1 View  Result Date: 10/12/2016 CLINICAL DATA:  61 y/o  M; healthcare associated pneumonia. EXAM: CHEST 1 VIEW COMPARISON:  10/02/2016 chest radiographs. FINDINGS: Increasing opacification within the lung bases probably representing worsening pneumonia and effusions. Stable cardiac silhouette. Aortic atherosclerosis with calcification. No acute osseous abnormality is evident. IMPRESSION: Increasing bilateral lung base opacification probably representing worsening pneumonia and effusions. Electronically Signed   By: Mitzi Hansen M.D.   On: 10/12/2016 03:09  Dg Chest 2 View  Result Date: 10/10/2016 CLINICAL DATA:  Syncope. EXAM: CHEST  2 VIEW COMPARISON:  September 29, 2016 FINDINGS:  Bilateral pleural effusions are a little larger in the interval. This may explain the increasing opacity in the lateral right lung base. There is obscuration of the right heart border as well as increased opacity over the heart on the lateral view. Mild increase lung markings in the bases bilaterally. A nodule over the right base is consistent with a nipple shadow, not seen previously. The cardiomediastinal silhouette is stable. No suspicious masses. No overt edema. IMPRESSION: 1. Small bilateral pleural effusions are larger in the interval. 2. Increasing opacity in the right middle lobe may represent developing infiltrate. Recommend follow-up to resolution. 3. No other acute abnormalities. Electronically Signed   By: Gerome Sam III M.D   On: 10/10/2016 14:54   Dg Pelvis 1-2 Views  Result Date: 10/10/2016 CLINICAL DATA:  Fall with pelvic pain.  Initial encounter. EXAM: PELVIS - 1-2 VIEW COMPARISON:  09/26/2015 radiographs FINDINGS: Irregularity of the right femur greater trochanter is suspicious for fracture. The left hip is unremarkable. No focal bony lesions are present. IMPRESSION: Question fracture of the right femur greater trochanter- recommend dedicated images of the right hip as clinically indicated. Electronically Signed   By: Harmon Pier M.D.   On: 10/10/2016 14:54   Ct Head Wo Contrast  Result Date: 10/10/2016 CLINICAL DATA:  Fall with laceration to the forehead.  Dizziness. EXAM: CT HEAD WITHOUT CONTRAST CT CERVICAL SPINE WITHOUT CONTRAST TECHNIQUE: Multidetector CT imaging of the head and cervical spine was performed following the standard protocol without intravenous contrast. Multiplanar CT image reconstructions of the cervical spine were also generated. COMPARISON:  08/07/2016 FINDINGS: CT HEAD FINDINGS Brain: Moderate generalized atrophy. Mild chronic small-vessel ischemic change of the hemispheric white matter. No sign of acute infarction, mass lesion, hemorrhage, hydrocephalus or  extra-axial collection. Vascular: There is atherosclerotic calcification of the major vessels at the base of the brain. Skull: Negative Sinuses/Orbits: Clear/normal Other: None significant CT CERVICAL SPINE FINDINGS Alignment: Normal Skull base and vertebrae: No fracture Soft tissues and spinal canal: Negative Disc levels: Facet arthropathy on the right at C3-4 and C4-5. Mild degenerative spondylosis from C3-4 through C7-T1. No significant bony foraminal stenosis. Upper chest: Negative Other: None IMPRESSION: Head CT:  Atrophy.  Chronic small-vessel ischemic change. Cervical spine CT: Chronic degenerative changes. No acute or traumatic finding. Electronically Signed   By: Paulina Fusi M.D.   On: 10/10/2016 14:30   Ct Cervical Spine Wo Contrast  Result Date: 10/10/2016 CLINICAL DATA:  Fall with laceration to the forehead.  Dizziness. EXAM: CT HEAD WITHOUT CONTRAST CT CERVICAL SPINE WITHOUT CONTRAST TECHNIQUE: Multidetector CT imaging of the head and cervical spine was performed following the standard protocol without intravenous contrast. Multiplanar CT image reconstructions of the cervical spine were also generated. COMPARISON:  08/07/2016 FINDINGS: CT HEAD FINDINGS Brain: Moderate generalized atrophy. Mild chronic small-vessel ischemic change of the hemispheric white matter. No sign of acute infarction, mass lesion, hemorrhage, hydrocephalus or extra-axial collection. Vascular: There is atherosclerotic calcification of the major vessels at the base of the brain. Skull: Negative Sinuses/Orbits: Clear/normal Other: None significant CT CERVICAL SPINE FINDINGS Alignment: Normal Skull base and vertebrae: No fracture Soft tissues and spinal canal: Negative Disc levels: Facet arthropathy on the right at C3-4 and C4-5. Mild degenerative spondylosis from C3-4 through C7-T1. No significant bony foraminal stenosis. Upper chest: Negative Other: None IMPRESSION: Head CT:  Atrophy.  Chronic  small-vessel ischemic change.  Cervical spine CT: Chronic degenerative changes. No acute or traumatic finding. Electronically Signed   By: Paulina Fusi M.D.   On: 10/10/2016 14:30   US Carotid Bilateral  Result Date: 10/11/2016 CLINICAL DATA:  Syncope, hypertension, stroke symptoms, tobacco use EXAM: BILATERAL CAROTID DUPLEX ULTRASOUND TECHNIQUE: Wallace Cullens scale imaging, color Doppler and duplex ultrasound were performed of bilateral carotid and vertebral arteries in the neck. COMPARISON:  None. FINDINGS: Criteria: Quantification of carotid stenosis is based on velocity parameters that correlate the residual internal carotid diameter with NASCET-based stenosis levels, using the diameter of the distal internal carotid lumen as the denominator for stenosis measurement. The following velocity measurements were obtained: RIGHT ICA:  75/20 cm/sec CCA:  81/16 cm/sec SYSTOLIC ICA/CCA RATIO:  0.9 DIASTOLIC ICA/CCA RATIO:  1.2 ECA:  102 cm/sec LEFT ICA:  123/30 cm/sec CCA:  69/15 cm/sec SYSTOLIC ICA/CCA RATIO:  1.8 DIASTOLIC ICA/CCA RATIO:  1.9 ECA:  54 cm/sec RIGHT CAROTID ARTERY: Heterogeneous moderate and partially calcified atherosclerosis of the right carotid bifurcation. Cannot exclude ulcerated plaque formation. Despite this, no hemodynamically significant right ICA stenosis, velocity elevation, or turbulent flow. Degree of narrowing less than 50%. RIGHT VERTEBRAL ARTERY:  Antegrade LEFT CAROTID ARTERY: Similar moderate heterogeneous partially calcified left carotid bifurcation atherosclerosis. Difficult to exclude ulcerated plaque formation. There is luminal narrowing by grayscale imaging in the proximal ICA. Despite this, there is no significant velocity elevation, turbulent flow, or hemodynamically significant ICA stenosis. Degree of narrowing also less than 50% by ultrasound criteria. LEFT VERTEBRAL ARTERY:  Antegrade IMPRESSION: Moderate bilateral carotid atherosclerosis. No hemodynamically significant ICA stenosis. Degree of narrowing  estimated less than 50% bilaterally by ultrasound criteria. Heterogeneous irregular carotid plaque formation. Difficult to exclude ulcerated plaque. Patent antegrade vertebral flow bilaterally Electronically Signed   By: Judie Petit.  Shick M.D.   On: 10/11/2016 12:04   Dg Femur Min 2 Views Right  Result Date: 10/10/2016 CLINICAL DATA:  Fall.  Right leg pain. EXAM: RIGHT FEMUR 2 VIEWS COMPARISON:  No recent. FINDINGS: Degenerative changes right hip and knee. Diffuse osteopenia. No evidence of fracture dislocation. Peripheral vascular calcification. IMPRESSION: 1. Degenerative changes right hip and knee. Diffuse osteopenia. No acute abnormality. 2. Peripheral vascular disease. Electronically Signed   By: Maisie Fus  Register   On: 10/10/2016 15:40     ASSESSMENT AND PLAN:   61 y.o. male history of COPD, hypertension, chronic alcohol abuse here presenting with fall and syncope.   1. RML PNA: Continue Levaquin Speech consult Has recommended dysphagia 3 diet he has aspiration with thin liquids   2. Hyponatremia from poor po intake/possible CHF  Monitor BMP for a.m.  3. 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 Hypokalemia: From poor po intake repleted Will watch electrolytes closely for refeeding syndrome  5. Syncope: troponins flat rules out for NSTEMI Follow up ECHO and cardotids PT eval  6 Essential HTN: Continue coreg 7. EtOh : CIWA  8. COPD: continue NEBS Oral steroids for 4 days  Management plans discussed with the patient and he is in agreement.  CODE STATUS: full  TOTAL TIME TAKING CARE OF THIS PATIENT: 29 minutes.     POSSIBLE D/C 1-2 days, DEPENDING ON CLINICAL CONDITION.   Azai Gaffin M.D on 10/12/2016 at 9:04 AM  Between 7am to 6pm - Pager - [phone] After 6pm go to  [email or web address] - password [email or web address]  [facility]  Office  [phone]  CC: Primary care physician; Evelene Croon, MD  Note: This dictation was prepared with Dragon dictation along with smaller phrase technology. Any transcriptional errors that result from this process are unintentional.

## 2016-10-12 NOTE — Progress Notes (Signed)
MEDICATION RELATED CONSULT NOTE -FOLLOW UP   Pharmacy Consult for Electrolyte Management    Patient Measurements: Height: 5\' 5"  (165.1 cm) Weight: 93 lb 4.8 oz (42.3 kg) IBW/kg (Calculated) : 61.5  Vital Signs: Temp: 97.7 F (36.5 C) (06/30 0807) Temp Source: Oral (06/30 0807) BP: 112/68 (06/30 0807) Pulse Rate: 80 (06/30 0807) Intake/Output from previous day: 06/29 0701 - 06/30 0700 In: 1285.4 [P.O.:360; I.V.:779.2; IV Piggyback:146.3] Out: 300 [Urine:300]  Labs:  Recent Labs  10/10/16 1318 10/11/16 0334 10/12/16 0453  WBC 4.8 4.6 3.4*  HGB 10.6* 10.9* 10.3*  HCT 31.2* 32.2* 30.6*  PLT 234 244 302  CREATININE 0.55* 0.61 0.61  MG  --  1.6* 1.7  PHOS  --  2.9 1.9*  ALBUMIN 2.4*  --   --   PROT 5.2*  --   --   AST 18  --   --   ALT 7*  --   --   ALKPHOS 62  --   --   BILITOT 0.5  --   --    Lab Results  Component Value Date   K 4.6 10/12/2016    Estimated Creatinine Clearance: 57.3 mL/min (by C-G formula based on SCr of 0.61 mg/dL).   Assessment: 61 yo male with PMH of alcohol abuse admitted with PNA. Pharmacy consulted for electrolyte monitoring and replacement.   Goal of Therapy:  K+: 3.5 - 5.1 mmol/L Mg: 1.9 - 2.4 mg/dL Phos:  2.5 - 4.6 mg/dL  Plan:  Will give NaPhos 15 mmol IV x 1 and Magnesium 2 g IV x 1.  Recheck labs @1800 .   Demetrius Charity, PharmD, BCPS Clinical Pharmacist 10/12/2016 8:42 AM

## 2016-10-12 NOTE — Progress Notes (Signed)
Assigned to pt from 1500-1900. Pt denies pain. Pt c/o sob, O2 sats in the high 90's on 2L O2 per [place]. Anxious, xanax given. Pt instructed to slow deep breathing, which was very helpful. Neb treatment given by RT.

## 2016-10-12 NOTE — Progress Notes (Signed)
MEDICATION RELATED CONSULT NOTE -FOLLOW UP   Pharmacy Consult for Electrolyte Management    Patient Measurements: Height: 5\' 5"  (165.1 cm) Weight: 93 lb 4.8 oz (42.3 kg) IBW/kg (Calculated) : 61.5  Vital Signs: Temp: 97.7 F (36.5 C) (06/30 1438) Temp Source: Oral (06/30 1438) BP: 135/80 (06/30 1746) Pulse Rate: 103 (06/30 1746) Intake/Output from previous day: 06/29 0701 - 06/30 0700 In: 1285.4 [P.O.:360; I.V.:779.2; IV Piggyback:146.3] Out: 300 [Urine:300]  Labs:  Recent Labs  10/10/16 1318 10/11/16 0334 10/12/16 0453 10/12/16 1819  WBC 4.8 4.6 3.4*  --   HGB 10.6* 10.9* 10.3*  --   HCT 31.2* 32.2* 30.6*  --   PLT 234 244 302  --   CREATININE 0.55* 0.61 0.61  --   MG  --  1.6* 1.7 1.9  PHOS  --  2.9 1.9* 3.3  ALBUMIN 2.4*  --   --   --   PROT 5.2*  --   --   --   AST 18  --   --   --   ALT 7*  --   --   --   ALKPHOS 62  --   --   --   BILITOT 0.5  --   --   --    Lab Results  Component Value Date   K 4.6 10/12/2016    Estimated Creatinine Clearance: 57.3 mL/min (by C-G formula based on SCr of 0.61 mg/dL).   Assessment: 61 yo male with PMH of alcohol abuse admitted with PNA. Pharmacy consulted for electrolyte monitoring and replacement.   Goal of Therapy:  K+: 3.5 - 5.1 mmol/L Mg: 1.9 - 2.4 mg/dL Phos:  2.5 - 4.6 mg/dL  Plan:  Will give NaPhos 15 mmol IV x 1 and Magnesium 2 g IV x 1.  Recheck labs @1800 .   6/30 @ 18:00 :   Phos = 3.3 , Mag = 1.9 No further electrolyte supplementation needed.  Will recheck electrolytes on 7/1 with AM labs.   Scherrie Gerlach, PharmD Clinical Pharmacist 10/12/2016 8:15 PM

## 2016-10-12 NOTE — Progress Notes (Signed)
  Speech Language Pathology Treatment: Dysphagia  Patient Details Name: Caleb Carpenter MRN: [id number] DOB: Jan 14, 1956 Today's Date: 10/12/2016 Time: 1287-8676 SLP Time Calculation (min) (ACUTE ONLY): 26 min  Assessment / Plan / Recommendation Clinical Impression  Mild - moderate dysphagia. Chart reviewed. Pt visited for Tx. Pt reports toleration of diet without difficulty. Remains afebrile. Noted straws at bedside, ST removed. Noted throat clear after noted 8 sips of thin liquid from the cup. Pt needed cues to take small sips. Vocal quality was clear. Will post sign above bed sating no straws and small sips. ST to f/u 1-3 days.  HPI   Pt with Hx of COPD, HTN, admitted with pneumonia, high alcohol, low sodium and potasium. See history and physical for details.     SLP Plan  Continue with current plan of care. F/u 1-3 days       Recommendations  Diet recommendations: Dysphagia 3 (mechanical soft) Liquids provided via: Cup;No straw Supervision: Patient able to self feed Compensations: Small sips/bites Postural Changes and/or Swallow Maneuvers: Seated upright 90 degrees       NO STRAWS         SLP Visit Diagnosis: Dysphagia, oropharyngeal phase (R13.12) Plan: Continue with current plan of care                      Eather Colas 10/12/2016, 12:17 PM

## 2016-10-13 LAB — BASIC METABOLIC PANEL
ANION GAP: 4 — AB (ref 5–15)
BUN: 14 mg/dL (ref 6–20)
CO2: 33 mmol/L — AB (ref 22–32)
Calcium: 8.2 mg/dL — ABNORMAL LOW (ref 8.9–10.3)
Chloride: 94 mmol/L — ABNORMAL LOW (ref 101–111)
Creatinine, Ser: 0.61 mg/dL (ref 0.61–1.24)
GFR calc Af Amer: >60 mL/min (ref >60)
GLUCOSE: 139 mg/dL — AB (ref 65–99)
POTASSIUM: 4.1 mmol/L (ref 3.5–5.1)
Sodium: 131 mmol/L — ABNORMAL LOW (ref 135–145)

## 2016-10-13 LAB — CULTURE, BLOOD (ROUTINE X 2): SPECIAL REQUESTS: ADEQUATE

## 2016-10-13 LAB — MAGNESIUM: MAGNESIUM: 1.8 mg/dL (ref 1.7–2.4)

## 2016-10-13 LAB — HEMOGLOBIN A1C
Hgb A1c MFr Bld: 5.3 % (ref 4.8–5.6)
Mean Plasma Glucose: 105 mg/dL

## 2016-10-13 LAB — PHOSPHORUS: Phosphorus: 2.7 mg/dL (ref 2.5–4.6)

## 2016-10-13 MED ORDER — LEVOFLOXACIN 750 MG PO TABS: 750.0000 mg | ORAL_TABLET | Freq: Every day | ORAL | 0 refills | Status: DC

## 2016-10-13 MED ORDER — DONEPEZIL HCL 5 MG PO TABS: 5.0000 mg | ORAL_TABLET | Freq: Every day | ORAL | 0 refills | Status: DC

## 2016-10-13 MED ORDER — PANTOPRAZOLE SODIUM 40 MG PO TBEC: 40.0000 mg | DELAYED_RELEASE_TABLET | Freq: Every day | ORAL | 0 refills | Status: DC

## 2016-10-13 MED ORDER — CARVEDILOL 3.125 MG PO TABS: 3.1250 mg | ORAL_TABLET | Freq: Two times a day (BID) | ORAL | 0 refills | Status: DC

## 2016-10-13 MED ORDER — ROSUVASTATIN CALCIUM 10 MG PO TABS: 10.0000 mg | ORAL_TABLET | Freq: Every day | ORAL | 0 refills | Status: DC

## 2016-10-13 MED ORDER — PREDNISONE 50 MG PO TABS: ORAL_TABLET | ORAL | 0 refills | Status: DC

## 2016-10-13 MED ORDER — TIOTROPIUM BROMIDE MONOHYDRATE 18 MCG IN CAPS: 18.0000 ug | ORAL_CAPSULE | Freq: Every day | RESPIRATORY_TRACT | 0 refills | Status: DC

## 2016-10-13 MED ORDER — ALPRAZOLAM 0.25 MG PO TABS: 0.2500 mg | ORAL_TABLET | Freq: Two times a day (BID) | ORAL | 0 refills | Status: DC | PRN

## 2016-10-13 MED ORDER — ASPIRIN 81 MG PO CHEW: 81.0000 mg | CHEWABLE_TABLET | Freq: Every day | ORAL | Status: DC

## 2016-10-13 MED ORDER — LISINOPRIL 5 MG PO TABS: 5.0000 mg | ORAL_TABLET | Freq: Every day | ORAL | 0 refills | Status: DC

## 2016-10-13 NOTE — Discharge Summary (Signed)
[facility] - [place] at [facility]   PATIENT NAME: Caleb Carpenter    MR#:  [id number]  DATE OF BIRTH:  06-Oct-1955  DATE OF ADMISSION:  10/10/2016 ADMITTING PHYSICIAN: Alford Highland, MD  DATE OF DISCHARGE: 10/13/2016  PRIMARY CARE PHYSICIAN: Evelene Croon, MD    ADMISSION DIAGNOSIS:  Syncope and collapse [R55] Syncope [R55] Elevated troponin [R74.8] Forehead laceration, initial encounter [S01.81XA]  DISCHARGE DIAGNOSIS:  Active Problems:   Pneumonia   SECONDARY DIAGNOSIS:   Past Medical History:  Diagnosis Date  . Asthma   . COPD (chronic obstructive pulmonary disease) (HCC)   . Hypertension     HOSPITAL COURSE:  61 y.o.malehistory of COPD, hypertension, chronic alcohol abuse here presenting with fall and syncope.   1. RML PNA: Continue Levaquin for 5 days Speech consult Has recommended dysphagia 3 diet  with thin liquids   2. Hyponatremia chronic from poor po intake Sodium is baseline  3. New onset cardiomyopathy EF of 30-35%: He will continue Coreg and low-dose ACE inhibitor. He will have outpatient follow-up with cardiology. He is referred to CHF clinic.  Due to low/normal blood pressure as well as poor nutritional status I have not discharge patient on Lasix.     4. Hypokalemia: From poor po intake This has been repleted.  5. Syncope:t patient has been ruled out for non-ST elevation MI 6 Essential HTN: Continue coreg 7. EtOh : He is encouraged to stop drinking EtOH. Detox was uneventful 8. COPD: continue NEBS Oral steroids for 3 more days 9. Severe malnutrition due to poor by mouth intake and chronic EtOH: Continue nutritional supplements  DISCHARGE CONDITIONS AND DIET:   Dysphagia 3 diet thin liquids Gen. aspiration precautions Stable for discharge  CONSULTS OBTAINED:  Treatment Team:  Alwyn Pea, MD  DRUG ALLERGIES:   Allergies  Allergen Reactions  . Codeine Other (See Comments)    Nasal drip     DISCHARGE MEDICATIONS:   Current Discharge Medication List    START taking these medications   Details  ALPRAZolam (XANAX) 0.25 MG tablet Take 1 tablet (0.25 mg total) by mouth 2 (two) times daily as needed for anxiety. Qty: 20 tablet, Refills: 0    aspirin 81 MG chewable tablet Chew 1 tablet (81 mg total) by mouth daily.    carvedilol (COREG) 3.125 MG tablet Take 1 tablet (3.125 mg total) by mouth 2 (two) times daily with a meal. Qty: 60 tablet, Refills: 0    levofloxacin (LEVAQUIN) 750 MG tablet Take 1 tablet (750 mg total) by mouth daily. Qty: 5 tablet, Refills: 0    predniSONE (DELTASONE) 50 MG tablet Take 1 tablet for 3 days and stop Qty: 3 tablet, Refills: 0    rosuvastatin (CRESTOR) 10 MG tablet Take 1 tablet (10 mg total) by mouth daily. Qty: 30 tablet, Refills: 0    tiotropium (SPIRIVA) 18 MCG inhalation capsule Place 1 capsule (18 mcg total) into inhaler and inhale daily. Qty: 30 capsule, Refills: 0      CONTINUE these medications which have CHANGED   Details  donepezil (ARICEPT) 5 MG tablet Take 1 tablet (5 mg total) by mouth at bedtime. Qty: 30 tablet, Refills: 0    lisinopril (PRINIVIL,ZESTRIL) 5 MG tablet Take 1 tablet (5 mg total) by mouth daily. Qty: 30 tablet, Refills: 0    pantoprazole (PROTONIX) 40 MG tablet Take 1 tablet (40 mg total) by mouth daily. Qty: 60 tablet, Refills: 0      CONTINUE these medications which have NOT  CHANGED   Details  albuterol (PROVENTIL HFA;VENTOLIN HFA) 108 (90 Base) MCG/ACT inhaler Inhale 2 puffs into the lungs 2 (two) times daily.    alendronate (FOSAMAX) 70 MG tablet Take 70 mg by mouth once a week. Take with a full glass of water on an empty stomach.    fluticasone (FLONASE) 50 MCG/ACT nasal spray Place 1 spray into both nostrils daily.    Tiotropium Bromide-Olodaterol (STIOLTO RESPIMAT) 2.5-2.5 MCG/ACT AERS Inhale 2 puffs into the lungs daily.    feeding supplement, ENSURE ENLIVE, (ENSURE ENLIVE) LIQD Take 237  mLs by mouth 2 (two) times daily between meals. Qty: 237 mL, Refills: 12      STOP taking these medications     buPROPion (WELLBUTRIN XL) 150 MG 24 hr tablet      diltiazem (CARDIZEM CD) 120 MG 24 hr capsule      meloxicam (MOBIC) 7.5 MG tablet      verapamil (VERELAN PM) 120 MG 24 hr capsule      mirtazapine (REMERON) 7.5 MG tablet           Today   CHIEF COMPLAINT:  No shortness of breath or chest pain   VITAL SIGNS:  Blood pressure 119/74, pulse 91, temperature 98.3 F (36.8 C), temperature source Oral, resp. rate 18, height 5\' 5"  (1.651 m), weight 42.3 kg (93 lb 4.8 oz), SpO2 100 %.   REVIEW OF SYSTEMS:  Review of Systems  Constitutional: Negative for chills, fever and malaise/fatigue.  HENT: Negative.  Negative for ear discharge, ear pain, hearing loss, nosebleeds and sore throat.   Eyes: Negative.  Negative for blurred vision and pain.  Respiratory: Negative.  Negative for cough, hemoptysis, shortness of breath and wheezing.   Cardiovascular: Negative.  Negative for chest pain, palpitations and leg swelling.  Gastrointestinal: Negative.  Negative for abdominal pain, blood in stool, diarrhea, nausea and vomiting.  Genitourinary: Negative.  Negative for dysuria.  Musculoskeletal: Negative.  Negative for back pain.  Skin: Negative.   Neurological: Positive for weakness. Negative for dizziness, tremors, speech change, focal weakness, seizures and headaches.  Endo/Heme/Allergies: Negative.  Does not bruise/bleed easily.  Psychiatric/Behavioral: Negative.  Negative for depression, hallucinations and suicidal ideas.     PHYSICAL EXAMINATION:  GENERAL:  61 y.o.-year-old patient lying in the bed with no acute distress. Very frail, thin and cachectic NECK:  Supple, no jugular venous distention. No thyroid enlargement, no tenderness.  LUNGS: Normal breath sounds bilaterally, no wheezing, rales,rhonchi  No use of accessory muscles of respiration.  CARDIOVASCULAR: S1,  S2 normal. No murmurs, rubs, or gallops.  ABDOMEN: Soft, non-tender, non-distended. Bowel sounds present. No organomegaly or mass.  EXTREMITIES: No pedal edema, cyanosis, or clubbing.  PSYCHIATRIC: The patient is alert and oriented x 3.  SKIN: Bruising on face is improved  DATA REVIEW:   CBC  Recent Labs Lab 10/12/16 0453  WBC 3.4*  HGB 10.3*  HCT 30.6*  PLT 302    Chemistries   Recent Labs Lab 10/10/16 1318  10/13/16 0433  NA 128*  < > 131*  K 3.0*  < > 4.1  CL 92*  < > 94*  CO2 28  < > 33*  GLUCOSE 107*  < > 139*  BUN 7  < > 14  CREATININE 0.55*  < > 0.61  CALCIUM 7.8*  < > 8.2*  MG  --   < > 1.8  AST 18  --   --   ALT 7*  --   --  ALKPHOS 62  --   --   BILITOT 0.5  --   --   < > = values in this interval not displayed.  Cardiac Enzymes  Recent Labs Lab 10/10/16 1318 10/10/16 1858 10/10/16 2104  TROPONINI 0.05* 0.05* 0.05*    Microbiology Results  @[email or web address] @  RADIOLOGY:  Dg Chest 1 View  Result Date: 10/12/2016 CLINICAL DATA:  61 y/o  M; healthcare associated pneumonia. EXAM: CHEST 1 VIEW COMPARISON:  10/02/2016 chest radiographs. FINDINGS: Increasing opacification within the lung bases probably representing worsening pneumonia and effusions. Stable cardiac silhouette. Aortic atherosclerosis with calcification. No acute osseous abnormality is evident. IMPRESSION: Increasing bilateral lung base opacification probably representing worsening pneumonia and effusions. Electronically Signed   By: Mitzi Hansen M.D.   On: 10/12/2016 03:09   US Carotid Bilateral  Result Date: 10/11/2016 CLINICAL DATA:  Syncope, hypertension, stroke symptoms, tobacco use EXAM: BILATERAL CAROTID DUPLEX ULTRASOUND TECHNIQUE: Wallace Cullens scale imaging, color Doppler and duplex ultrasound were performed of bilateral carotid and vertebral arteries in the neck. COMPARISON:  None. FINDINGS: Criteria: Quantification of carotid stenosis is based on velocity parameters that  correlate the residual internal carotid diameter with NASCET-based stenosis levels, using the diameter of the distal internal carotid lumen as the denominator for stenosis measurement. The following velocity measurements were obtained: RIGHT ICA:  75/20 cm/sec CCA:  81/16 cm/sec SYSTOLIC ICA/CCA RATIO:  0.9 DIASTOLIC ICA/CCA RATIO:  1.2 ECA:  102 cm/sec LEFT ICA:  123/30 cm/sec CCA:  69/15 cm/sec SYSTOLIC ICA/CCA RATIO:  1.8 DIASTOLIC ICA/CCA RATIO:  1.9 ECA:  54 cm/sec RIGHT CAROTID ARTERY: Heterogeneous moderate and partially calcified atherosclerosis of the right carotid bifurcation. Cannot exclude ulcerated plaque formation. Despite this, no hemodynamically significant right ICA stenosis, velocity elevation, or turbulent flow. Degree of narrowing less than 50%. RIGHT VERTEBRAL ARTERY:  Antegrade LEFT CAROTID ARTERY: Similar moderate heterogeneous partially calcified left carotid bifurcation atherosclerosis. Difficult to exclude ulcerated plaque formation. There is luminal narrowing by grayscale imaging in the proximal ICA. Despite this, there is no significant velocity elevation, turbulent flow, or hemodynamically significant ICA stenosis. Degree of narrowing also less than 50% by ultrasound criteria. LEFT VERTEBRAL ARTERY:  Antegrade IMPRESSION: Moderate bilateral carotid atherosclerosis. No hemodynamically significant ICA stenosis. Degree of narrowing estimated less than 50% bilaterally by ultrasound criteria. Heterogeneous irregular carotid plaque formation. Difficult to exclude ulcerated plaque. Patent antegrade vertebral flow bilaterally Electronically Signed   By: Judie Petit.  Shick M.D.   On: 10/11/2016 12:04      Current Discharge Medication List    START taking these medications   Details  ALPRAZolam (XANAX) 0.25 MG tablet Take 1 tablet (0.25 mg total) by mouth 2 (two) times daily as needed for anxiety. Qty: 20 tablet, Refills: 0    aspirin 81 MG chewable tablet Chew 1 tablet (81 mg total) by mouth  daily.    carvedilol (COREG) 3.125 MG tablet Take 1 tablet (3.125 mg total) by mouth 2 (two) times daily with a meal. Qty: 60 tablet, Refills: 0    levofloxacin (LEVAQUIN) 750 MG tablet Take 1 tablet (750 mg total) by mouth daily. Qty: 5 tablet, Refills: 0    predniSONE (DELTASONE) 50 MG tablet Take 1 tablet for 3 days and stop Qty: 3 tablet, Refills: 0    rosuvastatin (CRESTOR) 10 MG tablet Take 1 tablet (10 mg total) by mouth daily. Qty: 30 tablet, Refills: 0    tiotropium (SPIRIVA) 18 MCG inhalation capsule Place 1 capsule (18 mcg total) into inhaler and inhale daily. Qty:  30 capsule, Refills: 0      CONTINUE these medications which have CHANGED   Details  donepezil (ARICEPT) 5 MG tablet Take 1 tablet (5 mg total) by mouth at bedtime. Qty: 30 tablet, Refills: 0    lisinopril (PRINIVIL,ZESTRIL) 5 MG tablet Take 1 tablet (5 mg total) by mouth daily. Qty: 30 tablet, Refills: 0    pantoprazole (PROTONIX) 40 MG tablet Take 1 tablet (40 mg total) by mouth daily. Qty: 60 tablet, Refills: 0      CONTINUE these medications which have NOT CHANGED   Details  albuterol (PROVENTIL HFA;VENTOLIN HFA) 108 (90 Base) MCG/ACT inhaler Inhale 2 puffs into the lungs 2 (two) times daily.    alendronate (FOSAMAX) 70 MG tablet Take 70 mg by mouth once a week. Take with a full glass of water on an empty stomach.    fluticasone (FLONASE) 50 MCG/ACT nasal spray Place 1 spray into both nostrils daily.    Tiotropium Bromide-Olodaterol (STIOLTO RESPIMAT) 2.5-2.5 MCG/ACT AERS Inhale 2 puffs into the lungs daily.    feeding supplement, ENSURE ENLIVE, (ENSURE ENLIVE) LIQD Take 237 mLs by mouth 2 (two) times daily between meals. Qty: 237 mL, Refills: 12      STOP taking these medications     buPROPion (WELLBUTRIN XL) 150 MG 24 hr tablet      diltiazem (CARDIZEM CD) 120 MG 24 hr capsule      meloxicam (MOBIC) 7.5 MG tablet      verapamil (VERELAN PM) 120 MG 24 hr capsule      mirtazapine  (REMERON) 7.5 MG tablet            Management plans discussed with the patient and He is in agreement. Stable for discharge   Patient should follow up with cardiology [facility] PCP  CODE STATUS:     Code Status Orders        Start     Ordered   10/10/16 1705  Full code  Continuous     10/10/16 1705    Code Status History    Date Active Date Inactive Code Status Order ID Comments User Context   08/28/2016  3:01 AM 08/28/2016  7:58 PM Full Code [id number]  Hugelmeyer, Jon Gills, DO Inpatient   08/07/2016  9:45 PM 08/13/2016  8:32 PM Full Code [id number]  Adrian Saran, MD Inpatient   09/03/2015  1:16 AM 09/04/2015  7:49 PM Full Code [id number]  Oralia Manis, MD Inpatient   06/21/2015  9:01 AM 06/23/2015  8:05 PM Full Code [id number]  Katha Hamming, MD ED      TOTAL TIME TAKING CARE OF THIS PATIENT: 37 minutes.    Note: This dictation was prepared with Dragon dictation along with smaller phrase technology. Any transcriptional errors that result from this process are unintentional.  Chee Dimon M.D on 10/13/2016 at 7:38 AM  Between 7am to 6pm - Pager - [phone] After 6pm go to [email or web address] - Social research officer, government  [facility]  Office  [phone]  CC: Primary care physician; Evelene Croon, MD

## 2016-10-13 NOTE — Progress Notes (Signed)
Pt is being discharged home with [facility]. Discharge papers given and explained to pt. Pt verbalized understanding. Meds and f/u appointments reviewed with pt. RX given. Awaiting transportation.

## 2016-10-13 NOTE — Progress Notes (Signed)
MEDICATION RELATED CONSULT NOTE -FOLLOW UP   Pharmacy Consult for Electrolyte Management    Patient Measurements: Height: 5\' 5"  (165.1 cm) Weight: 93 lb 4.8 oz (42.3 kg) IBW/kg (Calculated) : 61.5  Vital Signs: Temp: 98.3 F (36.8 C) (07/01 0637) Temp Source: Oral (07/01 0637) BP: 119/74 (07/01 0233) Pulse Rate: 91 (07/01 0637) Intake/Output from previous day: 06/30 0701 - 07/01 0700 In: 240 [P.O.:240] Out: 685 [Urine:685]  Labs:  Recent Labs  10/10/16 1318  10/11/16 0334 10/12/16 0453 10/12/16 1819 10/13/16 0433  WBC 4.8  --  4.6 3.4*  --   --   HGB 10.6*  --  10.9* 10.3*  --   --   HCT 31.2*  --  32.2* 30.6*  --   --   PLT 234  --  244 302  --   --   CREATININE 0.55*  --  0.61 0.61  --  0.61  MG  --   < > 1.6* 1.7 1.9 1.8  PHOS  --   < > 2.9 1.9* 3.3 2.7  ALBUMIN 2.4*  --   --   --   --   --   PROT 5.2*  --   --   --   --   --   AST 18  --   --   --   --   --   ALT 7*  --   --   --   --   --   ALKPHOS 62  --   --   --   --   --   BILITOT 0.5  --   --   --   --   --   < > = values in this interval not displayed. Lab Results  Component Value Date   K 4.1 10/13/2016    Estimated Creatinine Clearance: 57.3 mL/min (by C-G formula based on SCr of 0.61 mg/dL).   Assessment: 61 yo male with PMH of alcohol abuse admitted with PNA. Pharmacy consulted for electrolyte monitoring and replacement.   Goal of Therapy:  K+: 3.5 - 5.1 mmol/L Mg: 1.9 - 2.4 mg/dL Phos:  2.5 - 4.6 mg/dL  Plan:  All labs within normal limits. Will recheck labs in am.  Demetrius Charity, PharmD Clinical Pharmacist 10/13/2016 7:37 AM

## 2016-10-13 NOTE — Care Management Note (Signed)
Case Management Note  Patient Details  Name: Caleb Carpenter MRN: [id number] Date of Birth: 1955-10-21  Subjective/Objective:     Discussed discharge planning with Caleb Carpenter. He reports that he already has a RW. A referral for HH-PT, RN, Aide, SW was called to Shaune Leeks at [facility]. No other discharge needs identified.                Action/Plan:   Expected Discharge Date:  10/13/16               Expected Discharge Plan:   10/13/16  In-House Referral:     Discharge planning Services     Post Acute Care Choice:   Yes Choice offered to:   Patient  DME Arranged:    DME Agency:     HH Arranged:   RN, PT, Aide, SW [facility] Agency:   [facility]  Status of Service:    Completed If discussed at Long Length of Stay Meetings, dates discussed:    Additional Comments:  Katty Fretwell A, RN 10/13/2016, 9:26 AM

## 2016-10-13 NOTE — Progress Notes (Signed)
Notified Dr Juliene Pina that multiple attempts to call physical therapy for reevaluation done, but nobody answers. Per Larita Fife, case manager HH set up. Per MD OK to discharge pt.

## 2016-10-13 NOTE — Care Management Note (Deleted)
Case Management Note  Patient Details  Name: Caleb Carpenter MRN: [id number] Date of Birth: 02/14/1956  Subjective/Objective:     Referral called to [facility]                Action/Plan:   Expected Discharge Date:  10/13/16               Expected Discharge Plan:     In-House Referral:     Discharge planning Services     Post Acute Care Choice:    Choice offered to:     DME Arranged:    DME Agency:     HH Arranged:    HH Agency:     Status of Service:     If discussed at [employer] of Stay Meetings, dates discussed:    Additional Comments:  Marguerite Jarboe A, RN 10/13/2016, 9:11 AM

## 2016-10-15 LAB — CULTURE, BLOOD (ROUTINE X 2)
Culture: NO GROWTH
SPECIAL REQUESTS: ADEQUATE

## 2016-11-01 ENCOUNTER — Ambulatory Visit: Payer: Medicaid Other | Attending: Family | Admitting: Family

## 2016-11-01 ENCOUNTER — Telehealth: Payer: Self-pay | Admitting: Family

## 2016-11-01 ENCOUNTER — Encounter: Payer: Self-pay | Admitting: Family

## 2016-11-01 VITALS — BP 150/89 | HR 72 | Resp 18 | Ht 65.0 in | Wt 91.2 lb

## 2016-11-01 DIAGNOSIS — J449 Chronic obstructive pulmonary disease, unspecified: Secondary | ICD-10-CM | POA: Diagnosis not present

## 2016-11-01 DIAGNOSIS — I5022 Chronic systolic (congestive) heart failure: Secondary | ICD-10-CM | POA: Diagnosis present

## 2016-11-01 DIAGNOSIS — Z885 Allergy status to narcotic agent status: Secondary | ICD-10-CM | POA: Insufficient documentation

## 2016-11-01 DIAGNOSIS — Z7951 Long term (current) use of inhaled steroids: Secondary | ICD-10-CM | POA: Insufficient documentation

## 2016-11-01 DIAGNOSIS — E871 Hypo-osmolality and hyponatremia: Secondary | ICD-10-CM | POA: Diagnosis not present

## 2016-11-01 DIAGNOSIS — F17220 Nicotine dependence, chewing tobacco, uncomplicated: Secondary | ICD-10-CM | POA: Diagnosis not present

## 2016-11-01 DIAGNOSIS — I10 Essential (primary) hypertension: Secondary | ICD-10-CM

## 2016-11-01 DIAGNOSIS — Z79899 Other long term (current) drug therapy: Secondary | ICD-10-CM | POA: Insufficient documentation

## 2016-11-01 DIAGNOSIS — I110 Hypertensive heart disease with heart failure: Secondary | ICD-10-CM | POA: Insufficient documentation

## 2016-11-01 DIAGNOSIS — Z7982 Long term (current) use of aspirin: Secondary | ICD-10-CM | POA: Diagnosis not present

## 2016-11-01 LAB — BASIC METABOLIC PANEL
ANION GAP: 6 (ref 5–15)
BUN: 5 mg/dL — ABNORMAL LOW (ref 6–20)
CHLORIDE: 99 mmol/L — AB (ref 101–111)
CO2: 31 mmol/L (ref 22–32)
Calcium: 8.1 mg/dL — ABNORMAL LOW (ref 8.9–10.3)
Creatinine, Ser: 0.66 mg/dL (ref 0.61–1.24)
GFR calc non Af Amer: >60 mL/min (ref >60)
Glucose, Bld: 89 mg/dL (ref 65–99)
Potassium: 2.2 mmol/L — CL (ref 3.5–5.1)
Sodium: 136 mmol/L (ref 135–145)

## 2016-11-01 MED ORDER — POTASSIUM CHLORIDE CRYS ER 20 MEQ PO TBCR: 40.0000 meq | EXTENDED_RELEASE_TABLET | Freq: Every day | ORAL | 3 refills | Status: DC

## 2016-11-01 MED ORDER — ROSUVASTATIN CALCIUM 10 MG PO TABS: 10.0000 mg | ORAL_TABLET | Freq: Every day | ORAL | 5 refills | Status: DC

## 2016-11-01 MED ORDER — CARVEDILOL 3.125 MG PO TABS: 3.1250 mg | ORAL_TABLET | Freq: Two times a day (BID) | ORAL | 5 refills | Status: DC

## 2016-11-01 NOTE — Telephone Encounter (Signed)
Spoke with patient regarding labs that were drawn today (11/01/16) but he wanted me to call his brother to discuss further.  Spoke with brother Cindee Lame about labs. Potassium is 2.2, sodium 136 and GFR >60. Not taking potassium supplements or a diuretic. Jackelyn Hoehn that I wanted patient to take of potassium today and then begin daily after that.   Will see patient and recheck labs on 01/07/17. Brother Cindee Lame verbalized understanding of instructions.

## 2016-11-01 NOTE — Progress Notes (Signed)
Patient ID: Caleb Carpenter, male    DOB: Jun 02, 1955, 61 y.o.   MRN: [id number]  HPI  Caleb Carpenter is a 61 y/o male with a history of HTN, COPD, asthma, hypokalemia, hyponatremia, current chewing tobacco use, alcohol use and chronic heart failure.   Reviewed last echo report done on 10/11/16 which showed an EF of 30-35% along with mild Caleb, moderate TR and severely elevated PA pressure of 70 mm Hg.   Admitted 10/10/16 due to syncope/fall. Treated with antibiotics for pneumonia. Speech consult obtained due to difficulty swallowing. Discharged after 3 days. Was in the ED 09/29/16 due to COPD exacerbation. Treated and released. Admitted 08/27/16 due to weakness due to dehydration. IV fluids given with improvement of hyponatremia. Discharged the next day. Admitted 08/07/16 due to pneumothorax after a mechanical fall and rib fractures. CT initially needed and then removed prior to discharge. Alcohol withdrawal reported. Discharged after 6 days.   He presents today for his initial visit with a chief complaint of mild shortness of breath with moderate exertion. He describes this as being chronic in nature and improving over the last several weeks. He has associated sleep disturbance and easy bruising along with this.   Past Medical History:  Diagnosis Date  . Asthma   . COPD (chronic obstructive pulmonary disease) (HCC)   . Hypertension    Past Surgical History:  Procedure Laterality Date  . CATARACT EXTRACTION, BILATERAL Bilateral   . ESOPHAGOGASTRODUODENOSCOPY (EGD) WITH PROPOFOL N/A 06/21/2015   Procedure: ESOPHAGOGASTRODUODENOSCOPY (EGD) WITH PROPOFOL;  Surgeon: Midge Minium, MD;  Location: [facility] ENDOSCOPY;  Service: Endoscopy;  Laterality: N/A;   Family History  Problem Relation Age of Onset  . Diabetes Unknown   . Hypertension Unknown    Social History  Substance Use Topics  . Smoking status: Former Smoker    Types: Cigarettes    Quit date: 06/21/2014  . Smokeless tobacco: Current User    Types: Chew   . Alcohol use 1.2 oz/week    2 Cans of beer per week     Comment: per day   Allergies  Allergen Reactions  . Codeine Other (See Comments)    Nasal drip   Prior to Admission medications   Medication Sig Start Date End Date Taking? Authorizing Provider  albuterol (PROVENTIL HFA;VENTOLIN HFA) 108 (90 Base) MCG/ACT inhaler Inhale 2 puffs into the lungs 2 (two) times daily.   Yes [provider]  aspirin 81 MG chewable tablet Chew 1 tablet (81 mg total) by mouth daily. 10/13/16  Yes Adrian Saran, MD  carvedilol (COREG) 3.125 MG tablet Take 1 tablet (3.125 mg total) by mouth 2 (two) times daily with a meal. 11/01/16  Yes Rheagan Nayak A, FNP  donepezil (ARICEPT) 5 MG tablet Take 1 tablet (5 mg total) by mouth at bedtime. 10/13/16  Yes Mody, Patricia Pesa, MD  feeding supplement, ENSURE ENLIVE, (ENSURE ENLIVE) LIQD Take 237 mLs by mouth 2 (two) times daily between meals. 08/13/16  Yes Mody, Patricia Pesa, MD  Fluticasone-Salmeterol (ADVAIR) 500-50 MCG/DOSE AEPB Inhale 1 puff into the lungs 2 (two) times daily.   Yes [provider]  lisinopril (PRINIVIL,ZESTRIL) 20 MG tablet Take 20 mg by mouth daily.   Yes [provider]  pantoprazole (PROTONIX) 40 MG tablet Take 1 tablet (40 mg total) by mouth daily. 10/13/16  Yes Mody, Patricia Pesa, MD  rosuvastatin (CRESTOR) 10 MG tablet Take 1 tablet (10 mg total) by mouth daily. 11/01/16  Yes Clarisa Kindred A, FNP  potassium chloride SA (K-DUR,KLOR-CON)  20 MEQ tablet Take 2 tablets (40 mEq total) by mouth daily. 11/01/16   Delma Freeze, FNP  Tiotropium Bromide-Olodaterol (STIOLTO RESPIMAT) 2.5-2.5 MCG/ACT AERS Inhale 2 puffs into the lungs daily.    [provider]   Review of Systems  Constitutional: Negative for appetite change and fatigue.  HENT: Positive for rhinorrhea. Negative for congestion and sore throat.   Eyes: Negative.   Respiratory: Positive for shortness of breath (at times). Negative for chest tightness.   Cardiovascular: Negative for  chest pain, palpitations and leg swelling.  Gastrointestinal: Negative for abdominal distention and abdominal pain.  Endocrine: Negative.   Genitourinary: Negative.   Musculoskeletal: Negative for back pain and neck pain.  Skin: Negative.   Allergic/Immunologic: Negative.   Neurological: Negative for dizziness and light-headedness.  Hematological: Negative for adenopathy. Bruises/bleeds easily.  Psychiatric/Behavioral: Positive for sleep disturbance (trouble falling asleep; sleeping on 1 pillow). Negative for dysphoric mood. The patient is not nervous/anxious.    Vitals:   11/01/16 1100  BP: (!) 150/89  Pulse: 72  Resp: 18  SpO2: 97%  Weight: 91 lb 4 oz (41.4 kg)  Height: 5\' 5"  (1.651 m)   Wt Readings from Last 3 Encounters:  11/01/16 91 lb 4 oz (41.4 kg)  10/10/16 93 lb 4.8 oz (42.3 kg)  09/28/16 90 lb (40.8 kg)   Lab Results  Component Value Date   CREATININE 0.66 11/01/2016   CREATININE 0.61 10/13/2016   CREATININE 0.61 10/12/2016   Physical Exam  Constitutional: He is oriented to person, place, and time. He appears well-developed and well-nourished.  HENT:  Head: Normocephalic and atraumatic.  Neck: Normal range of motion. Neck supple. No JVD present.  Cardiovascular: Normal rate and regular rhythm.   Pulmonary/Chest: Effort normal. He has no wheezes. He has no rales.  Abdominal: Soft. He exhibits no distension. There is no tenderness.  Musculoskeletal: He exhibits no edema or tenderness.  Neurological: He is alert and oriented to person, place, and time.  Skin: Skin is warm and dry.  Psychiatric: He has a normal mood and affect. His behavior is normal. Thought content normal.  Nursing note and vitals reviewed.  Assessment & Plan:  1: Chronic heart failure with reduced ejection fraction- - NYHA class II - euvolemic - not weighing daily. Instructed to begin weighing daily every morning, write it down and call for an overnight weight gain of >2 pounds or a weekly  weight gain of >5 pounds - not adding salt and is trying to follow a low sodium diet. Says that his brother doesn't cook with salt. Written dietary information was given to him about following a 2000mg  sodium diet - discussed possibly changing his lisinopril to entresto at his next visit - does not meet REDS vest criteria due to low BMI  2: Hyponatremia- - BMP drawn today  3: HTN- - BP looks good today - saw PCP (Niemyer) yesterday and returns on 11/28/16  Reviewed medication bottles.  Return here in 1 month or sooner for any questions/problems before then.

## 2016-11-01 NOTE — Patient Instructions (Signed)
Begin weighing daily and call for an overnight weight gain of > 2 pounds or a weekly weight gain of >5 pounds. 

## 2016-11-06 ENCOUNTER — Encounter: Payer: Self-pay | Admitting: Family

## 2016-11-06 ENCOUNTER — Ambulatory Visit: Payer: Medicaid Other | Attending: Family | Admitting: Family

## 2016-11-06 VITALS — BP 163/97 | HR 72 | Resp 18 | Ht 65.0 in | Wt 94.2 lb

## 2016-11-06 DIAGNOSIS — Z79899 Other long term (current) drug therapy: Secondary | ICD-10-CM | POA: Insufficient documentation

## 2016-11-06 DIAGNOSIS — G309 Alzheimer's disease, unspecified: Secondary | ICD-10-CM

## 2016-11-06 DIAGNOSIS — J449 Chronic obstructive pulmonary disease, unspecified: Secondary | ICD-10-CM | POA: Insufficient documentation

## 2016-11-06 DIAGNOSIS — I10 Essential (primary) hypertension: Secondary | ICD-10-CM

## 2016-11-06 DIAGNOSIS — F0390 Unspecified dementia without behavioral disturbance: Secondary | ICD-10-CM | POA: Diagnosis not present

## 2016-11-06 DIAGNOSIS — F17220 Nicotine dependence, chewing tobacco, uncomplicated: Secondary | ICD-10-CM | POA: Insufficient documentation

## 2016-11-06 DIAGNOSIS — Z7982 Long term (current) use of aspirin: Secondary | ICD-10-CM | POA: Insufficient documentation

## 2016-11-06 DIAGNOSIS — I110 Hypertensive heart disease with heart failure: Secondary | ICD-10-CM | POA: Diagnosis not present

## 2016-11-06 DIAGNOSIS — I5022 Chronic systolic (congestive) heart failure: Secondary | ICD-10-CM | POA: Diagnosis not present

## 2016-11-06 DIAGNOSIS — E876 Hypokalemia: Secondary | ICD-10-CM | POA: Insufficient documentation

## 2016-11-06 DIAGNOSIS — F0280 Dementia in other diseases classified elsewhere without behavioral disturbance: Secondary | ICD-10-CM

## 2016-11-06 LAB — BASIC METABOLIC PANEL
Anion gap: 11 (ref 5–15)
CHLORIDE: 104 mmol/L (ref 101–111)
CO2: 27 mmol/L (ref 22–32)
CREATININE: 0.76 mg/dL (ref 0.61–1.24)
Calcium: 9 mg/dL (ref 8.9–10.3)
GFR calc Af Amer: >60 mL/min (ref >60)
GFR calc non Af Amer: >60 mL/min (ref >60)
GLUCOSE: 88 mg/dL (ref 65–99)
POTASSIUM: 4.4 mmol/L (ref 3.5–5.1)
SODIUM: 142 mmol/L (ref 135–145)

## 2016-11-06 NOTE — Progress Notes (Signed)
Patient ID: Caleb Carpenter, male    DOB: Jan 27, 1956, 61 y.o.   MRN: [id number]  HPI  Mr Sortino is a 61 y/o male with a history of HTN, COPD, asthma, hypokalemia, hyponatremia, current chewing tobacco use, alcohol use and chronic heart failure.   Reviewed last echo report done on 10/11/16 which showed an EF of 30-35% along with mild MR, moderate TR and severely elevated PA pressure of 70 mm Hg.   Admitted 10/10/16 due to syncope/fall. Treated with antibiotics for pneumonia. Speech consult obtained due to difficulty swallowing. Discharged after 3 days. Was in the ED 09/29/16 due to COPD exacerbation. Treated and released. Admitted 08/27/16 due to weakness due to dehydration. IV fluids given with improvement of hyponatremia. Discharged the next day. Admitted 08/07/16 due to pneumothorax after a mechanical fall and rib fractures. CT initially needed and then removed prior to discharge. Alcohol withdrawal reported. Discharged after 6 days.   He presents today for his follow-up visit with a chief complaint of mild shortness of breath with moderate exertion. Describes this as chronic in nature and has been present for a few years. Has associated rhinorrhea and sleep disturbance along with this. Denies any fatigue, chest pain or swelling in his legs/abdomen.   Past Medical History:  Diagnosis Date  . Asthma   . COPD (chronic obstructive pulmonary disease) (HCC)   . Hypertension    Past Surgical History:  Procedure Laterality Date  . CATARACT EXTRACTION, BILATERAL Bilateral   . ESOPHAGOGASTRODUODENOSCOPY (EGD) WITH PROPOFOL N/A 06/21/2015   Procedure: ESOPHAGOGASTRODUODENOSCOPY (EGD) WITH PROPOFOL;  Surgeon: Midge Minium, MD;  Location: [facility] ENDOSCOPY;  Service: Endoscopy;  Laterality: N/A;   Family History  Problem Relation Age of Onset  . Diabetes Unknown   . Hypertension Unknown    Social History  Substance Use Topics  . Smoking status: Former Smoker    Types: Cigarettes    Quit date: 06/21/2014  .  Smokeless tobacco: Current User    Types: Chew  . Alcohol use 1.2 oz/week    2 Cans of beer per week     Comment: per day   Allergies  Allergen Reactions  . Codeine Other (See Comments)    Nasal drip   Prior to Admission medications   Medication Sig Start Date End Date Taking? Authorizing Provider  albuterol (PROVENTIL HFA;VENTOLIN HFA) 108 (90 Base) MCG/ACT inhaler Inhale 2 puffs into the lungs 2 (two) times daily.   Yes [provider]  aspirin 81 MG chewable tablet Chew 1 tablet (81 mg total) by mouth daily. 10/13/16  Yes Adrian Saran, MD  carvedilol (COREG) 3.125 MG tablet Take 1 tablet (3.125 mg total) by mouth 2 (two) times daily with a meal. 11/01/16  Yes Duvan Mousel A, FNP  donepezil (ARICEPT) 5 MG tablet Take 1 tablet (5 mg total) by mouth at bedtime. 10/13/16  Yes Mody, Patricia Pesa, MD  Fluticasone-Salmeterol (ADVAIR) 500-50 MCG/DOSE AEPB Inhale 1 puff into the lungs 2 (two) times daily.   Yes [provider]  lisinopril (PRINIVIL,ZESTRIL) 20 MG tablet Take 20 mg by mouth daily.   Yes [provider]  pantoprazole (PROTONIX) 40 MG tablet Take 1 tablet (40 mg total) by mouth daily. 10/13/16  Yes Mody, Patricia Pesa, MD  potassium chloride SA (K-DUR,KLOR-CON) 20 MEQ tablet Take 2 tablets (40 mEq total) by mouth daily. 11/01/16  Yes Clarisa Kindred A, FNP  rosuvastatin (CRESTOR) 10 MG tablet Take 1 tablet (10 mg total) by mouth daily. 11/01/16  Yes Delma Freeze,  FNP  Tiotropium Bromide-Olodaterol (STIOLTO RESPIMAT) 2.5-2.5 MCG/ACT AERS Inhale 2 puffs into the lungs daily.   Yes [provider]  feeding supplement, ENSURE ENLIVE, (ENSURE ENLIVE) LIQD Take 237 mLs by mouth 2 (two) times daily between meals. Patient not taking: Reported on 11/06/2016 08/13/16   Adrian Saran, MD    Review of Systems  Constitutional: Negative for appetite change and fatigue.  HENT: Positive for rhinorrhea. Negative for congestion and sore throat.   Eyes: Negative.   Respiratory: Positive  for shortness of breath (at times). Negative for cough and chest tightness.   Cardiovascular: Negative for chest pain, palpitations and leg swelling.  Gastrointestinal: Negative for abdominal distention and abdominal pain.  Endocrine: Negative.   Genitourinary: Negative.   Musculoskeletal: Negative for back pain and neck pain.  Skin: Negative.   Allergic/Immunologic: Negative.   Neurological: Negative for dizziness and light-headedness.       Memory loss  Hematological: Negative for adenopathy. Bruises/bleeds easily.  Psychiatric/Behavioral: Positive for sleep disturbance (trouble falling asleep; sleeping on 1 pillow). Negative for dysphoric mood. The patient is not nervous/anxious.    Vitals:   11/06/16 0941  BP: (!) 163/97  Pulse: 72  Resp: 18  SpO2: 97%  Weight: 94 lb 4 oz (42.8 kg)  Height: 5\' 5"  (1.651 m)   Wt Readings from Last 3 Encounters:  11/06/16 94 lb 4 oz (42.8 kg)  11/01/16 91 lb 4 oz (41.4 kg)  10/10/16 93 lb 4.8 oz (42.3 kg)    Lab Results  Component Value Date   CREATININE 0.66 11/01/2016   CREATININE 0.61 10/13/2016   CREATININE 0.61 10/12/2016   Physical Exam  Constitutional: He is oriented to person, place, and time. He appears well-developed and well-nourished.  HENT:  Head: Normocephalic and atraumatic.  Neck: Normal range of motion. Neck supple. No JVD present.  Cardiovascular: Normal rate and regular rhythm.   Pulmonary/Chest: Effort normal. He has no wheezes. He has no rales.  Abdominal: Soft. He exhibits no distension. There is no tenderness.  Musculoskeletal: He exhibits no edema or tenderness.  Neurological: He is alert and oriented to person, place, and time.  Skin: Skin is warm and dry.  Psychiatric: He has a normal mood and affect. His behavior is normal. Thought content normal.  Nursing note and vitals reviewed.  Assessment & Plan:  1: Chronic heart failure with reduced ejection fraction- - NYHA class II - euvolemic - not weighing  daily as he doesn't have access to scales. A set of scales was provided today and he was instructed to begin weighing daily every morning, write it down and call for an overnight weight gain of >2 pounds or a weekly weight gain of >5 pounds - not adding salt and is trying to follow a low sodium diet. Says that his brother doesn't cook with salt.  - consider changing his lisinopril to entresto next visit - does not meet REDS vest criteria due to low BMI - PharmD went in and reviewed medications with the patient - patient's brother says that he is walking more than he was  2: Hypokalemia- - potassium supplements started 11/01/16 due to potassium level of 2.2; is currently taking daily - re-check BMP today  3: HTN- - BP mildly elevated today; continue to follow - saw PCP Gordy Councilman) yesterday and returns on 11/28/16  4: Dementia- - does have memory loss and is currently taking aricept - sister-in-law does a weekly pillbox for patient as he says that he wouldn't know  how to take everything - currently has home health nurse that comes weekly; advised him to speak with them about obtaining ensure as he says that it's too expensive for him to buy consistently - looking into possible assisted living/group home environment for patient  Reviewed medication bottles.  Return here in 1 month or sooner for any questions/problems before then.

## 2016-11-06 NOTE — Patient Instructions (Addendum)
Begin weighing daily and call for an overnight weight gain of > 2 pounds or a weekly weight gain of >5 pounds. 

## 2016-11-07 ENCOUNTER — Telehealth: Payer: Self-pay | Admitting: Family

## 2016-11-07 NOTE — Telephone Encounter (Signed)
Spoke with patient's sister-in-law Myriam Jacobson regarding medication as she does patient's weekly pill box. Advised her that potassium level has improved from 2.2 to 4.4. Continues to not take a diuretic so will back off on potassium to daily. She verbalized understanding and we will recheck it at his next visit.

## 2016-12-02 ENCOUNTER — Ambulatory Visit: Payer: Medicaid Other | Attending: Family | Admitting: Family

## 2016-12-02 ENCOUNTER — Encounter: Payer: Self-pay | Admitting: Family

## 2016-12-02 VITALS — BP 100/68 | HR 120 | Resp 18 | Ht 65.0 in | Wt 88.4 lb

## 2016-12-02 DIAGNOSIS — E876 Hypokalemia: Secondary | ICD-10-CM | POA: Diagnosis not present

## 2016-12-02 DIAGNOSIS — F0390 Unspecified dementia without behavioral disturbance: Secondary | ICD-10-CM | POA: Insufficient documentation

## 2016-12-02 DIAGNOSIS — I10 Essential (primary) hypertension: Secondary | ICD-10-CM

## 2016-12-02 DIAGNOSIS — J449 Chronic obstructive pulmonary disease, unspecified: Secondary | ICD-10-CM | POA: Diagnosis not present

## 2016-12-02 DIAGNOSIS — Z79899 Other long term (current) drug therapy: Secondary | ICD-10-CM | POA: Diagnosis not present

## 2016-12-02 DIAGNOSIS — I5022 Chronic systolic (congestive) heart failure: Secondary | ICD-10-CM | POA: Diagnosis not present

## 2016-12-02 DIAGNOSIS — I110 Hypertensive heart disease with heart failure: Secondary | ICD-10-CM | POA: Diagnosis not present

## 2016-12-02 DIAGNOSIS — Z7982 Long term (current) use of aspirin: Secondary | ICD-10-CM | POA: Insufficient documentation

## 2016-12-02 DIAGNOSIS — M25562 Pain in left knee: Secondary | ICD-10-CM | POA: Insufficient documentation

## 2016-12-02 DIAGNOSIS — Z7951 Long term (current) use of inhaled steroids: Secondary | ICD-10-CM | POA: Diagnosis not present

## 2016-12-02 DIAGNOSIS — R42 Dizziness and giddiness: Secondary | ICD-10-CM | POA: Diagnosis not present

## 2016-12-02 DIAGNOSIS — M25552 Pain in left hip: Secondary | ICD-10-CM | POA: Insufficient documentation

## 2016-12-02 DIAGNOSIS — G309 Alzheimer's disease, unspecified: Secondary | ICD-10-CM

## 2016-12-02 DIAGNOSIS — Z885 Allergy status to narcotic agent status: Secondary | ICD-10-CM | POA: Insufficient documentation

## 2016-12-02 DIAGNOSIS — G8929 Other chronic pain: Secondary | ICD-10-CM

## 2016-12-02 DIAGNOSIS — Z87891 Personal history of nicotine dependence: Secondary | ICD-10-CM | POA: Insufficient documentation

## 2016-12-02 DIAGNOSIS — F0280 Dementia in other diseases classified elsewhere without behavioral disturbance: Secondary | ICD-10-CM

## 2016-12-02 NOTE — Progress Notes (Signed)
Patient ID: Caleb Carpenter, male    DOB: 04-18-1955, 61 y.o.   MRN: [id number]  HPI  Caleb Carpenter is a 61 y/o male with a history of HTN, COPD, asthma, hypokalemia, hyponatremia, current chewing tobacco use, alcohol use and chronic heart failure.   Reviewed last echo report done on 10/11/16 which showed an EF of 30-35% along with mild Caleb, moderate TR and severely elevated PA pressure of 70 mm Hg.   Admitted 10/10/16 due to syncope/fall. Treated with antibiotics for pneumonia. Speech consult obtained due to difficulty swallowing. Discharged after 3 days. Was in the ED 09/29/16 due to COPD exacerbation. Treated and released. Admitted 08/27/16 due to weakness due to dehydration. IV fluids given with improvement of hyponatremia. Discharged the next day. Admitted 08/07/16 due to pneumothorax after a mechanical fall and rib fractures. CT initially needed and then removed prior to discharge. Alcohol withdrawal reported. Discharged after 6 days.   He presents today for his follow-up visit with a chief complaint of left knee pain. He describes this as chronic having been present for several months with waxing/waning of severity. He does feel like the pain is worse recently with all the rainy weather. Has associated left hip pain along with this. Denies any numbness/tingling down the leg. No injury that he can recall.   Past Medical History:  Diagnosis Date  . Asthma   . COPD (chronic obstructive pulmonary disease) (HCC)   . Hypertension    Past Surgical History:  Procedure Laterality Date  . CATARACT EXTRACTION, BILATERAL Bilateral   . ESOPHAGOGASTRODUODENOSCOPY (EGD) WITH PROPOFOL N/A 06/21/2015   Procedure: ESOPHAGOGASTRODUODENOSCOPY (EGD) WITH PROPOFOL;  Surgeon: Midge Minium, MD;  Location: [facility] ENDOSCOPY;  Service: Endoscopy;  Laterality: N/A;   Family History  Problem Relation Age of Onset  . Diabetes Unknown   . Hypertension Unknown    Social History  Substance Use Topics  . Smoking status: Former  Smoker    Types: Cigarettes    Quit date: 06/21/2014  . Smokeless tobacco: Current User    Types: Chew  . Alcohol use 1.2 oz/week    2 Cans of beer per week     Comment: per day   Allergies  Allergen Reactions  . Codeine Other (See Comments)    Nasal drip   Prior to Admission medications   Medication Sig Start Date End Date Taking? Authorizing Provider  albuterol (PROVENTIL HFA;VENTOLIN HFA) 108 (90 Base) MCG/ACT inhaler Inhale 2 puffs into the lungs 2 (two) times daily.   Yes [provider]  aspirin 81 MG chewable tablet Chew 1 tablet (81 mg total) by mouth daily. 10/13/16  Yes Adrian Saran, MD  carvedilol (COREG) 3.125 MG tablet Take 1 tablet (3.125 mg total) by mouth 2 (two) times daily with a meal. 11/01/16  Yes Xavien Dauphinais A, FNP  donepezil (ARICEPT) 5 MG tablet Take 1 tablet (5 mg total) by mouth at bedtime. 10/13/16  Yes Mody, Patricia Pesa, MD  Fluticasone-Salmeterol (ADVAIR) 500-50 MCG/DOSE AEPB Inhale 1 puff into the lungs 2 (two) times daily.   Yes [provider]  lisinopril (PRINIVIL,ZESTRIL) 20 MG tablet Take 20 mg by mouth daily.   Yes [provider]  pantoprazole (PROTONIX) 40 MG tablet Take 1 tablet (40 mg total) by mouth daily. 10/13/16  Yes Mody, Patricia Pesa, MD  potassium chloride SA (K-DUR,KLOR-CON) 20 MEQ tablet Take 2 tablets (40 mEq total) by mouth daily. 11/01/16  Yes Evelynn Hench, Inetta Fermo A, FNP  rosuvastatin (CRESTOR) 10 MG tablet Take 1 tablet (  10 mg total) by mouth daily. 11/01/16  Yes Aisha Greenberger A, FNP  Tiotropium Bromide-Olodaterol (STIOLTO RESPIMAT) 2.5-2.5 MCG/ACT AERS Inhale 2 puffs into the lungs daily.   Yes [provider]  feeding supplement, ENSURE ENLIVE, (ENSURE ENLIVE) LIQD Take 237 mLs by mouth 2 (two) times daily between meals. Patient not taking: Reported on 11/06/2016 08/13/16   Adrian Saran, MD    Review of Systems  Constitutional: Positive for fatigue. Negative for appetite change.  HENT: Negative for congestion, rhinorrhea and sore  throat.   Eyes: Negative.   Respiratory: Positive for cough (at times) and shortness of breath. Negative for chest tightness.   Cardiovascular: Negative for chest pain, palpitations and leg swelling.  Gastrointestinal: Negative for abdominal distention and abdominal pain.  Endocrine: Negative.   Genitourinary: Negative.   Musculoskeletal: Positive for arthralgias (left knee/left hip). Negative for back pain and neck pain.  Skin: Negative.   Allergic/Immunologic: Negative.   Neurological: Positive for light-headedness. Negative for dizziness.       Memory loss  Hematological: Negative for adenopathy. Bruises/bleeds easily.  Psychiatric/Behavioral: Positive for sleep disturbance (trouble falling asleep; sleeping on 1 pillow). Negative for dysphoric mood. The patient is not nervous/anxious.    Vitals:   12/02/16 1138  BP: 100/68  Pulse: (!) 120  Resp: 18  SpO2: 96%  Weight: 88 lb 6 oz (40.1 kg)  Height: 5\' 5"  (1.651 m)   Wt Readings from Last 3 Encounters:  12/02/16 88 lb 6 oz (40.1 kg)  11/06/16 94 lb 4 oz (42.8 kg)  11/01/16 91 lb 4 oz (41.4 kg)    Lab Results  Component Value Date   CREATININE 0.76 11/06/2016   CREATININE 0.66 11/01/2016   CREATININE 0.61 10/13/2016   Physical Exam  Constitutional: He is oriented to person, place, and time. He appears well-developed and well-nourished.  HENT:  Head: Normocephalic and atraumatic.  Neck: Normal range of motion. Neck supple. No JVD present.  Cardiovascular: Normal rate and regular rhythm.   Pulmonary/Chest: Effort normal. He has no wheezes. He has no rales.  Abdominal: Soft. He exhibits no distension. There is no tenderness.  Musculoskeletal: He exhibits no edema.       Left knee: He exhibits no swelling and no ecchymosis. Tenderness found.  Neurological: He is alert and oriented to person, place, and time.  Skin: Skin is warm and dry.  Psychiatric: He has a normal mood and affect. His behavior is normal. Thought content  normal.  Nursing note and vitals reviewed.  Assessment & Plan:  1: Chronic heart failure with reduced ejection fraction- - NYHA class II - euvolemic - weighing daily and he's lost some weight. Reminded to call for an overnight weight gain of >2 pounds or a weekly weight gain of >5 pounds. By our scale, he's lost 5.8 pounds since he was last here - says that he's eating "when I can". Brother that he lives with says that the patient doesn't eat - not adding salt and is trying to follow a low sodium diet. Says that his brother doesn't cook with salt.  - BP too low to change to entresto - does not meet REDS vest criteria due to low BMI - patient's brother says that he is walking more than he was  2: Hypokalemia- - is currently taking daily - was going to recheck BMP today but patient says that his PCP just drew labs on 11/28/16 so will request those results from his PCP  3: HTN- - BP  low today along with light-headedness - will decrease his lisinopril to 10mg  daily (1/2 tablet daily); instructions were also given to patient's brother - saw PCP Caleb Carpenter) 11/28/16 and returns 12/31/16  4: Dementia- - does have memory loss and is currently taking aricept - patient says that he's currently managing his medications himself by looking at the bottle directions - looking into possible assisted living/group home environment for patient - has lost weight since he was last here and am unsure if he's forgetting to eat, gets full easily or doesn't have access; patient says that he eats when he can and his brother says that patient doesn't eat much  5: Left knee pain- - chronic in nature but worsening with the rainy weather - instructed him to follow-up with his PCP regarding this  Reviewed medication bottles.  Return here in 2 months or sooner for any questions/problems before then.

## 2016-12-02 NOTE — Patient Instructions (Addendum)
Continue weighing daily and call for an overnight weight gain of > 2 pounds or a weekly weight gain of >5 pounds.  Decrease Lisinopril to 1/2 tablet daily (10mg  total)

## 2017-01-30 ENCOUNTER — Emergency Department
Admission: EM | Admit: 2017-01-30 | Discharge: 2017-01-31 | Disposition: A | Discharge status: Home / Self Care | Payer: Medicaid Other | Attending: Emergency Medicine | Admitting: Emergency Medicine

## 2017-01-30 ENCOUNTER — Emergency Department: Payer: Medicaid Other

## 2017-01-30 ENCOUNTER — Encounter: Payer: Self-pay | Admitting: Emergency Medicine

## 2017-01-30 DIAGNOSIS — Z79899 Other long term (current) drug therapy: Secondary | ICD-10-CM | POA: Diagnosis not present

## 2017-01-30 DIAGNOSIS — I110 Hypertensive heart disease with heart failure: Secondary | ICD-10-CM | POA: Diagnosis not present

## 2017-01-30 DIAGNOSIS — R5383 Other fatigue: Secondary | ICD-10-CM | POA: Insufficient documentation

## 2017-01-30 DIAGNOSIS — I5022 Chronic systolic (congestive) heart failure: Secondary | ICD-10-CM | POA: Insufficient documentation

## 2017-01-30 DIAGNOSIS — Z7982 Long term (current) use of aspirin: Secondary | ICD-10-CM | POA: Insufficient documentation

## 2017-01-30 DIAGNOSIS — F0390 Unspecified dementia without behavioral disturbance: Secondary | ICD-10-CM | POA: Insufficient documentation

## 2017-01-30 DIAGNOSIS — Y907 Blood alcohol level of 200-239 mg/100 ml: Secondary | ICD-10-CM | POA: Insufficient documentation

## 2017-01-30 DIAGNOSIS — F17220 Nicotine dependence, chewing tobacco, uncomplicated: Secondary | ICD-10-CM | POA: Insufficient documentation

## 2017-01-30 DIAGNOSIS — J45909 Unspecified asthma, uncomplicated: Secondary | ICD-10-CM | POA: Diagnosis not present

## 2017-01-30 DIAGNOSIS — J449 Chronic obstructive pulmonary disease, unspecified: Secondary | ICD-10-CM | POA: Insufficient documentation

## 2017-01-30 DIAGNOSIS — F10920 Alcohol use, unspecified with intoxication, uncomplicated: Secondary | ICD-10-CM | POA: Insufficient documentation

## 2017-01-30 DIAGNOSIS — R531 Weakness: Secondary | ICD-10-CM | POA: Diagnosis not present

## 2017-01-30 LAB — BASIC METABOLIC PANEL
Anion gap: 11 (ref 5–15)
CHLORIDE: 95 mmol/L — AB (ref 101–111)
CO2: 25 mmol/L (ref 22–32)
Calcium: 8.4 mg/dL — ABNORMAL LOW (ref 8.9–10.3)
Creatinine, Ser: 0.74 mg/dL (ref 0.61–1.24)
GFR calc Af Amer: >60 mL/min (ref >60)
GFR calc non Af Amer: >60 mL/min (ref >60)
GLUCOSE: 80 mg/dL (ref 65–99)
POTASSIUM: 4 mmol/L (ref 3.5–5.1)
Sodium: 131 mmol/L — ABNORMAL LOW (ref 135–145)

## 2017-01-30 LAB — CBC WITH DIFFERENTIAL/PLATELET
Basophils Absolute: 0.1 10*3/uL (ref 0–0.1)
Basophils Relative: 0 %
EOS ABS: 0.1 10*3/uL (ref 0–0.7)
EOS PCT: 0 %
HCT: 40.1 % (ref 40.0–52.0)
Hemoglobin: 13.4 g/dL (ref 13.0–18.0)
LYMPHS ABS: 1.8 10*3/uL (ref 1.0–3.6)
LYMPHS PCT: 14 %
MCH: 32.1 pg (ref 26.0–34.0)
MCHC: 33.4 g/dL (ref 32.0–36.0)
MCV: 96 fL (ref 80.0–100.0)
MONOS PCT: 4 %
Monocytes Absolute: 0.6 10*3/uL (ref 0.2–1.0)
Neutro Abs: 10.7 10*3/uL — ABNORMAL HIGH (ref 1.4–6.5)
Neutrophils Relative %: 82 %
PLATELETS: 232 10*3/uL (ref 150–440)
RBC: 4.18 MIL/uL — ABNORMAL LOW (ref 4.40–5.90)
RDW: 15.4 % — ABNORMAL HIGH (ref 11.5–14.5)
WBC: 13.2 10*3/uL — ABNORMAL HIGH (ref 3.8–10.6)

## 2017-01-30 LAB — HEPATIC FUNCTION PANEL
ALK PHOS: 87 U/L (ref 38–126)
ALT: 12 U/L — AB (ref 17–63)
AST: 29 U/L (ref 15–41)
Albumin: 3.5 g/dL (ref 3.5–5.0)
BILIRUBIN DIRECT: 0.2 mg/dL (ref 0.1–0.5)
BILIRUBIN INDIRECT: 0.2 mg/dL — AB (ref 0.3–0.9)
BILIRUBIN TOTAL: 0.4 mg/dL (ref 0.3–1.2)
Total Protein: 6.4 g/dL — ABNORMAL LOW (ref 6.5–8.1)

## 2017-01-30 LAB — BRAIN NATRIURETIC PEPTIDE: B Natriuretic Peptide: 94 pg/mL (ref 0.0–100.0)

## 2017-01-30 LAB — ETHANOL: ALCOHOL ETHYL (B): 221 mg/dL — AB (ref <10)

## 2017-01-30 LAB — TROPONIN I: Troponin I: <0.03 ng/mL (ref <0.03)

## 2017-01-30 MED ORDER — SODIUM CHLORIDE 0.9 % IV BOLUS (SEPSIS)
1000.0000 mL | Freq: Once | INTRAVENOUS | Status: AC
  Administered 2017-01-30: 1000 mL via INTRAVENOUS

## 2017-01-30 NOTE — ED Provider Notes (Signed)
[facility] Emergency Department Provider Note  ____________________________________________   First MD Initiated Contact with Patient 01/30/17 1812     (approximate)  I have reviewed the triage vital signs and the nursing notes.   HISTORY  Chief Complaint Weakness  level V exemption history Limited as the patient is a challenging historian  HPI Caleb Carpenter is a 61 y.o. male 2 comes to the emergency department requesting a place to stay. He has been living at his brothers house for some time however his brother kicked him out of the house today and the patient states he is homeless. He has a complex past medical history including congestive heart failure, COPD, mild dementia, and alcohol abuse. He says he has had no fevers or chills. No chest pain or shortness of breath. No abdominal pain nausea or vomiting. He says he has chronic pain and "wants to be checked out all over".   Past Medical History:  Diagnosis Date  . Asthma   . COPD (chronic obstructive pulmonary disease) (HCC)   . Hypertension     Patient Active Problem List   Diagnosis Date Noted  . Hypokalemia 11/06/2016  . Dementia 11/06/2016  . Chronic systolic heart failure (HCC) 11/01/2016  . Pneumonia 10/10/2016  . Dehydration 08/28/2016  . Pneumothorax 08/07/2016  . General weakness 09/03/2015  . Generalized weakness 09/02/2015  . Hyponatremia 09/02/2015  . HTN (hypertension) 09/02/2015  . GERD (gastroesophageal reflux disease) 09/02/2015  . Hypomagnesemia 06/23/2015  . Leukocytosis 06/23/2015  . Alcohol abuse 06/23/2015  . Thrombocytopenia (HCC) 06/23/2015  . Upper GI bleed 06/21/2015  . Reflux esophagitis   . Gastritis   . Duodenal ulceration     Past Surgical History:  Procedure Laterality Date  . CATARACT EXTRACTION, BILATERAL Bilateral   . ESOPHAGOGASTRODUODENOSCOPY (EGD) WITH PROPOFOL N/A 06/21/2015   Procedure: ESOPHAGOGASTRODUODENOSCOPY (EGD) WITH PROPOFOL;  Surgeon:  Midge Minium, MD;  Location: [facility] ENDOSCOPY;  Service: Endoscopy;  Laterality: N/A;    Prior to Admission medications   Medication Sig Start Date End Date Taking? Authorizing Provider  albuterol (PROVENTIL HFA;VENTOLIN HFA) 108 (90 Base) MCG/ACT inhaler Inhale 2 puffs into the lungs 2 (two) times daily.    [provider]  aspirin 81 MG chewable tablet Chew 1 tablet (81 mg total) by mouth daily. 10/13/16   Adrian Saran, MD  buPROPion (WELLBUTRIN XL) 150 MG 24 hr tablet Take 150 mg by mouth daily.    [provider]  carvedilol (COREG) 3.125 MG tablet Take 1 tablet (3.125 mg total) by mouth 2 (two) times daily with a meal. 11/01/16   Clarisa Kindred A, FNP  donepezil (ARICEPT) 5 MG tablet Take 1 tablet (5 mg total) by mouth at bedtime. 10/13/16   Adrian Saran, MD  feeding supplement, ENSURE ENLIVE, (ENSURE ENLIVE) LIQD Take 237 mLs by mouth 2 (two) times daily between meals. Patient not taking: Reported on 11/06/2016 08/13/16   Adrian Saran, MD  Fluticasone-Salmeterol (ADVAIR) 500-50 MCG/DOSE AEPB Inhale 1 puff into the lungs 2 (two) times daily.    [provider]  lisinopril (PRINIVIL,ZESTRIL) 20 MG tablet Take 10 mg by mouth daily.    [provider]  pantoprazole (PROTONIX) 40 MG tablet Take 1 tablet (40 mg total) by mouth daily. 10/13/16   Adrian Saran, MD  potassium chloride SA (K-DUR,KLOR-CON) 20 MEQ tablet Take 20 mEq by mouth daily.    [provider]  rosuvastatin (CRESTOR) 10 MG tablet Take 1 tablet (10 mg total) by mouth daily.  11/01/16   Delma Freeze, FNP  Tiotropium Bromide-Olodaterol (STIOLTO RESPIMAT) 2.5-2.5 MCG/ACT AERS Inhale 2 puffs into the lungs daily.    [provider]    Allergies Codeine  Family History  Problem Relation Age of Onset  . Diabetes Unknown   . Hypertension Unknown     Social History Social History  Substance Use Topics  . Smoking status: Former Smoker    Types: Cigarettes    Quit date: 06/21/2014  .  Smokeless tobacco: Current User    Types: Chew  . Alcohol use 1.2 oz/week    2 Cans of beer per week     Comment: per day    Review of Systems level V exemption history Limited by the patient's intoxication ____________________________________________   PHYSICAL EXAM:  VITAL SIGNS: ED Triage Vitals  Enc Vitals Group     BP 01/30/17 1811 (!) 157/94     Pulse Rate 01/30/17 1811 71     Resp 01/30/17 1811 20     Temp 01/30/17 1811 97.9 F (36.6 C)     Temp Source 01/30/17 1811 Oral     SpO2 01/30/17 1811 100 %     Weight 01/30/17 1811 92 lb (41.7 kg)     Height 01/30/17 1811 5\' 5"  (1.651 m)     Head Circumference --      Peak Flow --      Pain Score 01/30/17 1810 8     Pain Loc --      Pain Edu? --      Excl. in GC? --     Constitutional: cachectic and chronically ill-appearing but in no acute distress no shortness of breath Eyes: PERRL EOMI. Head: Atraumatic. Nose: No congestion/rhinnorhea. Mouth/Throat: No trismus Neck: No stridor.   Cardiovascular: Normal rate, regular rhythm. Grossly normal heart sounds.  Good peripheral circulation. Respiratory: Normal respiratory effort.  No retractions. Lungs CTAB and moving good air Gastrointestinal: soft nontender Musculoskeletal: No lower extremity edema   Neurologic:  Normal speech and language. No gross focal neurologic deficits are appreciated. Skin:  Skin is warm, dry and intact. No rash noted. Psychiatric: sad and flat affect    ____________________________________________   DIFFERENTIAL includes but not limited to  urinary tract infection, metabolic arrangement, dehydration, intracerebral hemorrhage, alcohol intoxication, drug overdose ____________________________________________   LABS (all labs ordered are listed, but only abnormal results are displayed)  Labs Reviewed  BASIC METABOLIC PANEL - Abnormal; Notable for the following:       Result Value   Sodium 131 (*)    Chloride 95 (*)    BUN <5 (*)     Calcium 8.4 (*)    All other components within normal limits  HEPATIC FUNCTION PANEL - Abnormal; Notable for the following:    Total Protein 6.4 (*)    ALT 12 (*)    Indirect Bilirubin 0.2 (*)    All other components within normal limits  CBC WITH DIFFERENTIAL/PLATELET - Abnormal; Notable for the following:    WBC 13.2 (*)    RBC 4.18 (*)    RDW 15.4 (*)    Neutro Abs 10.7 (*)    All other components within normal limits  ETHANOL - Abnormal; Notable for the following:    Alcohol, Ethyl (B) 221 (*)    All other components within normal limits  BRAIN NATRIURETIC PEPTIDE  TROPONIN I  URINALYSIS, COMPLETE (UACMP) WITH MICROSCOPIC  URINE DRUG SCREEN, QUALITATIVE ([facility] ONLY)    blood work reviewed and interpreted by  me shows slightly elevated ethanol level otherwise unremarkable __________________________________________  EKG  ED ECG REPORT I, Merrily BrittleNeil Corona Popovich, the attending physician, personally viewed and interpreted this ECG.  Date: 01/30/2017 EKG Time:  Rate: 86 Rhythm: normal sinus rhythm QRS Axis: normal Intervals: normal ST/T Wave abnormalities: normal Narrative Interpretation: very wavy baseline with significant artifact with patient appears to be in normal sinus rhythm with occasional premature ventricular contractions and no obvious ST elevation  ____________________________________________  RADIOLOGY  head CT reviewed by me with chronic changes but no acute disease Chest x-ray reviewed by me shows emphysematous changes but no acute disease ____________________________________________   PROCEDURES  Procedure(s) performed: no  Procedures  Critical Care performed: no  Observation: no ____________________________________________   INITIAL IMPRESSION / ASSESSMENT AND PLAN / ED COURSE  Pertinent labs & imaging results that were available during my care of the patient were reviewed by me and considered in my medical decision making (see chart for  details).  On arrival the patient is hemodynamically stable. History is challenging to obtain however he does seem to report exertional fatigue and shortness of breath. Lungs are largely clear. Differential is broad in this patient with multiple comorbidities so I will check broad labs, urinalysis, chest x-ray and head CT.     ----------------------------------------- 7:52 PM on 01/30/2017 -----------------------------------------  The patient's medical workup is negative aside from slight alcohol intoxication. The patient is not currently withdrawing from alcohol. There is no indication for acute inpatient admission, but he is homeless and does not have a safe place to stay. At this point I would like him to see the social worker in the morning. ____________________________________________   FINAL CLINICAL IMPRESSION(S) / ED DIAGNOSES  Final diagnoses:  Weakness  Fatigue, unspecified type  Alcoholic intoxication without complication (HCC)      NEW MEDICATIONS STARTED DURING THIS VISIT:  New Prescriptions   No medications on file     Note:  This document was prepared using Dragon voice recognition software and may include unintentional dictation errors.     Merrily Brittleifenbark, Kameka Whan, MD 01/30/17 1956

## 2017-01-30 NOTE — ED Triage Notes (Addendum)
"   I want a whole complete physical".  My shoulder has not been acting right. My hips have been hurting. C/o chest hurting for 2 weeks. Has been weak for months. NAD.  "i hope I stay here". Pt homeless.

## 2017-01-30 NOTE — Discharge Instructions (Addendum)
Please make an appointment to follow-up with your primary care physician this coming Monday for a reevaluation and return to the emergency department sooner for any concerns whatsoever.  It was a pleasure to take care of you today, and thank you for coming to our emergency department.  If you have any questions or concerns before leaving please ask the nurse to grab me and I'm more than happy to go through your aftercare instructions again.  If you were prescribed any opioid pain medication today such as Norco, Vicodin, Percocet, morphine, hydrocodone, or oxycodone please make sure you do not drive when you are taking this medication as it can alter your ability to drive safely.  If you have any concerns once you are home that you are not improving or are in fact getting worse before you can make it to your follow-up appointment, please do not hesitate to call 911 and come back for further evaluation.  Merrily Brittle, MD  Results for orders placed or performed during the hospital encounter of 01/30/17  Basic metabolic panel  Result Value Ref Range   Sodium 131 (L) 135 - 145 mmol/L   Potassium 4.0 3.5 - 5.1 mmol/L   Chloride 95 (L) 101 - 111 mmol/L   CO2 25 22 - 32 mmol/L   Glucose, Bld 80 65 - 99 mg/dL   BUN <5 (L) 6 - 20 mg/dL   Creatinine, Ser 6.62 0.61 - 1.24 mg/dL   Calcium 8.4 (L) 8.9 - 10.3 mg/dL   GFR calc non Af Amer >60 >60 mL/min   GFR calc Af Amer >60 >60 mL/min   Anion gap 11 5 - 15  Hepatic function panel  Result Value Ref Range   Total Protein 6.4 (L) 6.5 - 8.1 g/dL   Albumin 3.5 3.5 - 5.0 g/dL   AST 29 15 - 41 U/L   ALT 12 (L) 17 - 63 U/L   Alkaline Phosphatase 87 38 - 126 U/L   Total Bilirubin 0.4 0.3 - 1.2 mg/dL   Bilirubin, Direct 0.2 0.1 - 0.5 mg/dL   Indirect Bilirubin 0.2 (L) 0.3 - 0.9 mg/dL  Brain natriuretic peptide  Result Value Ref Range   B Natriuretic Peptide 94.0 0.0 - 100.0 pg/mL  Troponin I  Result Value Ref Range   Troponin I <0.03 <0.03 ng/mL    CBC with Differential  Result Value Ref Range   WBC 13.2 (H) 3.8 - 10.6 K/uL   RBC 4.18 (L) 4.40 - 5.90 MIL/uL   Hemoglobin 13.4 13.0 - 18.0 g/dL   HCT 94.7 65.4 - 65.0 %   MCV 96.0 80.0 - 100.0 fL   MCH 32.1 26.0 - 34.0 pg   MCHC 33.4 32.0 - 36.0 g/dL   RDW 35.4 (H) 65.6 - 81.2 %   Platelets 232 150 - 440 K/uL   Neutrophils Relative % 82 %   Neutro Abs 10.7 (H) 1.4 - 6.5 K/uL   Lymphocytes Relative 14 %   Lymphs Abs 1.8 1.0 - 3.6 K/uL   Monocytes Relative 4 %   Monocytes Absolute 0.6 0.2 - 1.0 K/uL   Eosinophils Relative 0 %   Eosinophils Absolute 0.1 0 - 0.7 K/uL   Basophils Relative 0 %   Basophils Absolute 0.1 0 - 0.1 K/uL  Ethanol  Result Value Ref Range   Alcohol, Ethyl (B) 221 (H) <10 mg/dL   Dg Chest 1 View  Result Date: 01/30/2017 CLINICAL DATA:  Chest pain x2 weeks.  Weakness. EXAM: CHEST 1  VIEW COMPARISON:  10/11/2016 FINDINGS: The heart size and mediastinal contours are within normal limits. Aortic atherosclerosis at the arch without aneurysm. Emphysematous hyperinflation both lungs with clearing of airspace disease and pleural effusions since prior exam. No new pneumonic consolidation. No pneumothorax. Chronic ununited right humeral neck fracture with chronic healed deformity of the distal right clavicle. Chronic bilateral lower rib fractures. IMPRESSION: 1. 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 2. Chronic bilateral lower rib fractures with healing. 3. COPD. Electronically Signed   By: Tollie Ethavid  Kwon M.D.   On: 01/30/2017 19:51   Ct Head Wo Contrast  Result Date: 01/30/2017 CLINICAL DATA:  Weakness EXAM: CT HEAD WITHOUT CONTRAST TECHNIQUE: Contiguous axial images were obtained from the base of the skull through the vertex without intravenous contrast. COMPARISON:  10/10/2016 FINDINGS: Brain: There is no evidence for acute hemorrhage, hydrocephalus, mass lesion, or abnormal extra-axial fluid collection. No definite CT evidence for acute infarction. Diffuse  loss of parenchymal volume is consistent with atrophy. Patchy low attenuation in the deep hemispheric and periventricular white matter is nonspecific, but likely reflects chronic microvascular ischemic demyelination. Vascular: No hyperdense vessel or unexpected calcification. Skull: No evidence for fracture. No worrisome lytic or sclerotic lesion. Sinuses/Orbits: The visualized paranasal sinuses and mastoid air cells are clear. Visualized portions of the globes and intraorbital fat are unremarkable. Other: None. IMPRESSION: 1. No acute intracranial abnormality. 2. Atrophy with chronic small vessel white matter ischemic disease. Electronically Signed   By: Kennith CenterEric  Mansell M.D.   On: 01/30/2017 19:30

## 2017-01-31 LAB — URINALYSIS, COMPLETE (UACMP) WITH MICROSCOPIC
BACTERIA UA: NONE SEEN
Bilirubin Urine: NEGATIVE
Glucose, UA: NEGATIVE mg/dL
HGB URINE DIPSTICK: NEGATIVE
Ketones, ur: NEGATIVE mg/dL
Leukocytes, UA: NEGATIVE
Nitrite: NEGATIVE
PROTEIN: NEGATIVE mg/dL
RBC / HPF: NONE SEEN RBC/hpf (ref 0–5)
SQUAMOUS EPITHELIAL / LPF: NONE SEEN
Specific Gravity, Urine: 1.008 (ref 1.005–1.030)
WBC UA: NONE SEEN WBC/hpf (ref 0–5)
pH: 6 (ref 5.0–8.0)

## 2017-01-31 LAB — URINE DRUG SCREEN, QUALITATIVE (ARMC ONLY)
AMPHETAMINES, UR SCREEN: NOT DETECTED
Barbiturates, Ur Screen: NOT DETECTED
Benzodiazepine, Ur Scrn: NOT DETECTED
CANNABINOID 50 NG, UR ~~LOC~~: NOT DETECTED
Cocaine Metabolite,Ur ~~LOC~~: NOT DETECTED
MDMA (ECSTASY) UR SCREEN: NOT DETECTED
METHADONE SCREEN, URINE: NOT DETECTED
Opiate, Ur Screen: NOT DETECTED
Phencyclidine (PCP) Ur S: NOT DETECTED
TRICYCLIC, UR SCREEN: NOT DETECTED

## 2017-01-31 NOTE — ED Notes (Signed)
Pt ambulated to the restroom without assistance

## 2017-01-31 NOTE — Clinical Social Work Note (Signed)
Clinical Social Work Assessment  Patient Details  Name: Caleb Carpenter MRN: [id number] Date of Birth: [birth year]  Date of referral:  01/31/17               Reason for consult:  Family Concerns, [employer], Housing Concerns/Homelessness                Permission sought to share information with:  Family Supports Permission granted to share information::  Yes, Verbal Permission Granted  Name::     Caleb Carpenter [phone]  Agency::     Relationship::     Contact Information:     Housing/Transportation Living arrangements for the past 2 months:  Single Family Home Source of Information:  Patient Patient Interpreter Needed:  None Criminal Activity/Legal Involvement Pertinent to Current Situation/Hospitalization:  No - Comment as needed Significant Relationships:  Siblings Lives with:  Siblings Do you feel safe going back to the place where you live?  Yes Need for family participation in patient care:  Yes (Comment)  Care giving concerns: LCSW spoke with patients brother Caleb Carpenter who stated he is able to return to his home. Patient has been living with his brother for over a year and they will assist him in finding him a bording house or room to rent.   Social Worker assessment / plan: LCSW introduced myself to patient who gave verbal consent to participate and speak to his brother. Patient was oriented x4 and reports he was living with his brother for over a year and does not want to go back. It was explained he had 92.00 and he could go to [employer] to see a housing specialist. Several other options were discussed and information on bording houses and [place] [address] handout was reviewed. LCSW provided patient with group home resource list for [place] but once he found out they would take his check he no longer wanted to live their. He reports he does take his medications daily and goes to [employer]. He is agreeable to go back and live with his brother for the time being. It  was explained we do not find housing for people who had disagreements with family members. Patient is now willing to be respectful of his brothers house rules and will try and find himself another place to stay. LCSW called Roetta Sessions and he has agreed for his brother to return to his house and is expecting him. No further needs at this time.  Employment status:  Disabled (Comment on whether or not currently receiving Disability) (SSI 800.00 month) Insurance information:  Medicaid [place] PT Recommendations:  Not assessed at this time Information / Referral to community resources:  Support Groups, Other (Comment Required) ([place] houses and group home list and [employer])  Patient/Family's Response to care: He understands its his responsibility to find housing himself  Patient/Family's Understanding of and Emotional Response to Diagnosis, Current Treatment, and Prognosis:  Family will attempt to provide transportation to DSS and [employer] and will assist him in locating housing needs in the near future  Emotional Assessment Appearance:  Appears stated age Attitude/Demeanor/Rapport:  Avoidant Affect (typically observed):  Accepting, Calm Orientation:  Oriented to Self, Oriented to Place, Oriented to  Time, Oriented to Situation Alcohol / Substance use:  Alcohol Use Psych involvement (Current and /or in the community):  No (Comment)  Discharge Needs  Concerns to be addressed:  Homelessness, Coping/Stress Concerns Readmission within the last 30 days:  No Current discharge risk:  None Barriers  to Discharge:  No Barriers Identified   Cheron SchaumannBandi, Matilynn Dacey M, LCSW 01/31/2017, 9:00 AM

## 2017-01-31 NOTE — ED Provider Notes (Signed)
Patient received in sign-out from Dr. Manson Passey.  Workup and evaluation pending Social work consultation.  patient is medically cleared for discharge. It entered with a steady gait. He is tolerating oral hydration. Patient has safe disposition back to the brother's house who agrees to accept him. Patient stable for discharge home.      Willy Eddy, MD 01/31/17 [phone]

## 2017-01-31 NOTE — Progress Notes (Signed)
LCSW consulted with EDP and ED RN charge. EDRN will provide cab voucher to have patient return to his brothers house in [place]. Assessment completed and several resources were reviewed with patient for future housing needs. Patient will review and call himself once in the community.  Kenzly Rogoff LCSW

## 2017-02-03 ENCOUNTER — Telehealth: Payer: Self-pay | Admitting: Family

## 2017-02-03 ENCOUNTER — Ambulatory Visit: Payer: Medicaid Other | Admitting: Family

## 2017-02-03 NOTE — Telephone Encounter (Signed)
Patient did not show for his [facility] appointment on 02/03/17. Will attempt to reschedule.

## 2017-02-03 NOTE — Progress Notes (Deleted)
Patient ID: Caleb Carpenter, male    DOB: 13-Jan-1956, 61 y.o.   MRN: [id number]  HPI  Caleb Carpenter is a 61 y/o male with a history of HTN, COPD, asthma, hypokalemia, hyponatremia, current chewing tobacco use, alcohol use and chronic heart failure.   Reviewed last echo report done on 10/11/16 which showed an EF of 30-35% along with mild Caleb, moderate TR and severely elevated PA pressure of 70 mm Hg.   Was in the ED 01/30/17 due to weakness and alcohol use. Patient says that he was kicked out of his brother's home. No alcohol withdrawal and he was released back to his brother's home. Admitted 10/10/16 due to syncope/fall. Treated with antibiotics for pneumonia. Speech consult obtained due to difficulty swallowing. Discharged after 3 days. Was in the ED 09/29/16 due to COPD exacerbation. Treated and released. Admitted 08/27/16 due to weakness due to dehydration. IV fluids given with improvement of hyponatremia. Discharged the next day. Admitted 08/07/16 due to pneumothorax after a mechanical fall and rib fractures. CT initially needed and then removed prior to discharge. Alcohol withdrawal reported. Discharged after 6 days.   He presents today for his follow-up visit with a chief complaint of   Past Medical History:  Diagnosis Date  . Asthma   . COPD (chronic obstructive pulmonary disease) (HCC)   . Hypertension    Past Surgical History:  Procedure Laterality Date  . CATARACT EXTRACTION, BILATERAL Bilateral   . ESOPHAGOGASTRODUODENOSCOPY (EGD) WITH PROPOFOL N/A 06/21/2015   Procedure: ESOPHAGOGASTRODUODENOSCOPY (EGD) WITH PROPOFOL;  Surgeon: Midge Minium, MD;  Location: [facility] ENDOSCOPY;  Service: Endoscopy;  Laterality: N/A;   Family History  Problem Relation Age of Onset  . Diabetes Unknown   . Hypertension Unknown    Social History  Substance Use Topics  . Smoking status: Former Smoker    Types: Cigarettes    Quit date: 06/21/2014  . Smokeless tobacco: Current User    Types: Chew  . Alcohol use 1.2  oz/week    2 Cans of beer per week     Comment: per day   Allergies  Allergen Reactions  . Codeine Other (See Comments)    Nasal drip     Review of Systems  Constitutional: Positive for fatigue. Negative for appetite change.  HENT: Negative for congestion, rhinorrhea and sore throat.   Eyes: Negative.   Respiratory: Positive for cough (at times) and shortness of breath. Negative for chest tightness.   Cardiovascular: Negative for chest pain, palpitations and leg swelling.  Gastrointestinal: Negative for abdominal distention and abdominal pain.  Endocrine: Negative.   Genitourinary: Negative.   Musculoskeletal: Positive for arthralgias (left knee/left hip). Negative for back pain and neck pain.  Skin: Negative.   Allergic/Immunologic: Negative.   Neurological: Positive for light-headedness. Negative for dizziness.       Memory loss  Hematological: Negative for adenopathy. Bruises/bleeds easily.  Psychiatric/Behavioral: Positive for sleep disturbance (trouble falling asleep; sleeping on 1 pillow). Negative for dysphoric mood. The patient is not nervous/anxious.      Lab Results  Component Value Date   CREATININE 0.74 01/30/2017   CREATININE 0.76 11/06/2016   CREATININE 0.66 11/01/2016   Physical Exam  Constitutional: He is oriented to person, place, and time. He appears well-developed and well-nourished.  HENT:  Head: Normocephalic and atraumatic.  Neck: Normal range of motion. Neck supple. No JVD present.  Cardiovascular: Normal rate and regular rhythm.   Pulmonary/Chest: Effort normal. He has no wheezes. He has no rales.  Abdominal:  Soft. He exhibits no distension. There is no tenderness.  Musculoskeletal: He exhibits no edema.       Left knee: He exhibits no swelling and no ecchymosis. Tenderness found.  Neurological: He is alert and oriented to person, place, and time.  Skin: Skin is warm and dry.  Psychiatric: He has a normal mood and affect. His behavior is normal.  Thought content normal.  Nursing note and vitals reviewed.  Assessment & Plan:  1: Chronic heart failure with reduced ejection fraction- - NYHA class II - euvolemic - weighing daily and he's lost some weight. Reminded to call for an overnight weight gain of >2 pounds or a weekly weight gain of >5 pounds. By our scale, he's lost 5.8 pounds since he was last here - says that he's eating "when I can". Brother that he lives with says that the patient doesn't eat - not adding salt and is trying to follow a low sodium diet. Says that his brother doesn't cook with salt.  - BP too low to change to entresto - does not meet REDS vest criteria due to low BMI - patient's brother says that he is walking more than he was  2: Hypokalemia- - is currently taking 20meq daily - BMP from 01/30/17 reviewed and shows sodium 131, potassium 4.0 and GFR >60  3: HTN- - BP low today along with light-headedness - will decrease his lisinopril to 10mg  daily (1/2 tablet daily); instructions were also given to patient's brother - saw PCP Gordy Councilman([place]) 11/28/16 and returns 12/31/16  4: Dementia- - does have memory loss and is currently taking aricept - patient says that he's currently managing his medications himself by looking at the bottle directions - looking into possible assisted living/group home environment for patient - has lost weight since he was last here and am unsure if he's forgetting to eat, gets full easily or doesn't have access; patient says that he eats when he can and his brother says that patient doesn't eat much    Reviewed medication bottles.

## 2017-02-11 ENCOUNTER — Ambulatory Visit: Payer: Medicaid Other | Admitting: Family

## 2017-02-17 ENCOUNTER — Other Ambulatory Visit: Payer: Self-pay

## 2017-02-17 ENCOUNTER — Ambulatory Visit: Payer: Medicaid Other | Attending: Family | Admitting: Family

## 2017-02-17 ENCOUNTER — Encounter: Payer: Self-pay | Admitting: Family

## 2017-02-17 VITALS — BP 139/99 | HR 99 | Resp 18 | Ht 65.0 in | Wt 96.2 lb

## 2017-02-17 DIAGNOSIS — I5022 Chronic systolic (congestive) heart failure: Secondary | ICD-10-CM

## 2017-02-17 DIAGNOSIS — Z87891 Personal history of nicotine dependence: Secondary | ICD-10-CM | POA: Insufficient documentation

## 2017-02-17 DIAGNOSIS — I110 Hypertensive heart disease with heart failure: Secondary | ICD-10-CM | POA: Diagnosis present

## 2017-02-17 DIAGNOSIS — F0390 Unspecified dementia without behavioral disturbance: Secondary | ICD-10-CM | POA: Insufficient documentation

## 2017-02-17 DIAGNOSIS — I10 Essential (primary) hypertension: Secondary | ICD-10-CM | POA: Insufficient documentation

## 2017-02-17 DIAGNOSIS — F17220 Nicotine dependence, chewing tobacco, uncomplicated: Secondary | ICD-10-CM | POA: Insufficient documentation

## 2017-02-17 DIAGNOSIS — Z7982 Long term (current) use of aspirin: Secondary | ICD-10-CM | POA: Insufficient documentation

## 2017-02-17 DIAGNOSIS — I509 Heart failure, unspecified: Secondary | ICD-10-CM | POA: Diagnosis not present

## 2017-02-17 DIAGNOSIS — Z79899 Other long term (current) drug therapy: Secondary | ICD-10-CM | POA: Diagnosis not present

## 2017-02-17 DIAGNOSIS — J449 Chronic obstructive pulmonary disease, unspecified: Secondary | ICD-10-CM | POA: Diagnosis not present

## 2017-02-17 DIAGNOSIS — G309 Alzheimer's disease, unspecified: Secondary | ICD-10-CM

## 2017-02-17 DIAGNOSIS — J45909 Unspecified asthma, uncomplicated: Secondary | ICD-10-CM | POA: Diagnosis not present

## 2017-02-17 DIAGNOSIS — W19XXXS Unspecified fall, sequela: Secondary | ICD-10-CM

## 2017-02-17 DIAGNOSIS — F0280 Dementia in other diseases classified elsewhere without behavioral disturbance: Secondary | ICD-10-CM

## 2017-02-17 NOTE — Patient Instructions (Signed)
Continue weighing daily and call for an overnight weight gain of > 2 pounds or a weekly weight gain of >5 pounds. 

## 2017-02-17 NOTE — Progress Notes (Signed)
Patient ID: Caleb Carpenter, male    DOB: January 23, 1956, 61 y.o.   MRN: [id number]  HPI  Caleb Carpenter is a 61 y/o male with a history of HTN, COPD, asthma, hypokalemia, hyponatremia, current chewing tobacco use, alcohol use and chronic heart failure.   Reviewed last echo report done on 10/11/16 which showed an EF of 30-35% along with mild Caleb, moderate TR and severely elevated PA pressure of 70 mm Hg.   Was in the ED 01/30/17 due to weakness. Evaluated and released. Admitted 10/10/16 due to syncope/fall. Treated with antibiotics for pneumonia. Speech consult obtained due to difficulty swallowing. Discharged after 3 days. Was in the ED 09/29/16 due to COPD exacerbation. Treated and released. Admitted 08/27/16 due to weakness due to dehydration. IV fluids given with improvement of hyponatremia. Discharged the next day. Admitted 08/07/16 due to pneumothorax after a mechanical fall and rib fractures. CT initially needed and then removed prior to discharge. Alcohol withdrawal reported. Discharged after 6 days.   He presents today for his follow-up visit with a chief complaint of mild shortness of breath upon moderate exertion. He describes this as chronic in nature having been present for several years with varying levels of severity. He has associated fatigue, cough, light-headedness, anxiety, difficulty sleeping and a gradual weight gain. He denies any chest pain, edema or palpitations. Recently fell and hit his right temple. Denies passing out and says that he doesn't really know what happened.   Past Medical History:  Diagnosis Date  . Asthma   . COPD (chronic obstructive pulmonary disease) (HCC)   . Hypertension    Past Surgical History:  Procedure Laterality Date  . CATARACT EXTRACTION, BILATERAL Bilateral    Family History  Problem Relation Age of Onset  . Diabetes Unknown   . Hypertension Unknown    Social History   Tobacco Use  . Smoking status: Former Smoker    Types: Cigarettes    Last attempt  to quit: 06/21/2014    Years since quitting: 2.6  . Smokeless tobacco: Current User    Types: Chew  Substance Use Topics  . Alcohol use: Yes    Alcohol/week: 1.2 oz    Types: 2 Cans of beer per week    Comment: per day   Allergies  Allergen Reactions  . Codeine Other (See Comments)    Nasal drip   Prior to Admission medications   Medication Sig Start Date End Date Taking? Authorizing Provider  albuterol (PROVENTIL HFA;VENTOLIN HFA) 108 (90 Base) MCG/ACT inhaler Inhale 2 puffs into the lungs 2 (two) times daily.   Yes [provider]  aspirin 81 MG chewable tablet Chew 1 tablet (81 mg total) by mouth daily. 10/13/16  Yes Mody, Patricia Pesa, MD  buPROPion (WELLBUTRIN XL) 150 MG 24 hr tablet Take 150 mg by mouth daily.   Yes [provider]  carvedilol (COREG) 3.125 MG tablet Take 1 tablet (3.125 mg total) by mouth 2 (two) times daily with a meal. 11/01/16  Yes Bellamia Ferch A, FNP  feeding supplement, ENSURE ENLIVE, (ENSURE ENLIVE) LIQD Take 237 mLs by mouth 2 (two) times daily between meals. 08/13/16  Yes Mody, Patricia Pesa, MD  Fluticasone-Salmeterol (ADVAIR) 500-50 MCG/DOSE AEPB Inhale 1 puff into the lungs 2 (two) times daily.   Yes [provider]  lisinopril (PRINIVIL,ZESTRIL) 20 MG tablet Take 10 mg by mouth daily.   Yes [provider]  pantoprazole (PROTONIX) 40 MG tablet Take 1 tablet (40 mg total) by mouth daily. 10/13/16  Yes Mody, Sital, MD  potassium chloride SA (K-DUR,KLOR-CON) 20 MEQ tablet Take 20 mEq by mouth daily.   Yes [provider]  rosuvastatin (CRESTOR) 10 MG tablet Take 1 tablet (10 mg total) by mouth daily. 11/01/16  Yes Roda Lauture A, FNP  Tiotropium Bromide-Olodaterol (STIOLTO RESPIMAT) 2.5-2.5 MCG/ACT AERS Inhale 2 puffs into the lungs daily.   Yes [provider]  donepezil (ARICEPT) 5 MG tablet Take 1 tablet (5 mg total) by mouth at bedtime. Patient not taking: Reported on 02/17/2017 10/13/16   Adrian SaranMody, Sital, MD   Review of  Systems  Constitutional: Positive for fatigue. Negative for appetite change.  HENT: Negative for congestion, rhinorrhea and sore throat.   Eyes: Negative.   Respiratory: Positive for cough (at times) and shortness of breath. Negative for chest tightness.   Cardiovascular: Negative for chest pain, palpitations and leg swelling.  Gastrointestinal: Negative for abdominal distention and abdominal pain.  Endocrine: Negative.   Genitourinary: Negative.   Musculoskeletal: Positive for arthralgias (left knee/left hip). Negative for back pain and neck pain.  Skin: Negative.   Allergic/Immunologic: Negative.   Neurological: Positive for light-headedness. Negative for dizziness.       Memory loss  Hematological: Negative for adenopathy. Bruises/bleeds easily.  Psychiatric/Behavioral: Positive for sleep disturbance (trouble falling asleep; sleeping on 1 pillow). Negative for dysphoric mood. The patient is nervous/anxious.    Vitals:   02/17/17 1537  BP: (!) 139/99  Pulse: 99  Resp: 18  SpO2: 98%  Weight: 96 lb 4 oz (43.7 kg)  Height: 5\' 5"  (1.651 m)   Wt Readings from Last 3 Encounters:  02/17/17 96 lb 4 oz (43.7 kg)  01/30/17 92 lb (41.7 kg)  12/02/16 88 lb 6 oz (40.1 kg)    Lab Results  Component Value Date   CREATININE 0.74 01/30/2017   CREATININE 0.76 11/06/2016   CREATININE 0.66 11/01/2016   Physical Exam  Constitutional: He is oriented to person, place, and time. He appears well-developed and well-nourished.  HENT:  Head: Normocephalic and atraumatic.  Neck: Normal range of motion. Neck supple. No JVD present.  Cardiovascular: Regular rhythm. Tachycardia present.  Pulmonary/Chest: Effort normal. He has no wheezes. He has rhonchi in the right upper field and the left lower field. He has no rales.  Rhonchi clears with cough  Abdominal: Soft. He exhibits no distension. There is no tenderness.  Musculoskeletal: He exhibits no edema.       Left knee: He exhibits no swelling and  no ecchymosis. Tenderness found.  Neurological: He is alert and oriented to person, place, and time.  Skin: Skin is warm and dry. Abrasion (above right eyebrow/temple area) noted.  Psychiatric: He has a normal mood and affect. His behavior is normal. Thought content normal.  Nursing note and vitals reviewed.  Assessment & Plan:  1: Chronic heart failure with reduced ejection fraction- - NYHA class II - euvolemic - weighing daily and he's put back on some weight. Reminded to call for an overnight weight gain of >2 pounds or a weekly weight gain of >5 pounds. By our scale, he's gained 8 pounds since he was last here but may also have heavier clothes on since it's cold outside - not adding salt and is trying to follow a low sodium diet although is planning on eating 2 hot dogs for dinner "all the way". Had potato salad for lunch - does not meet REDS vest criteria due to low BMI - now living with a friend for the last  week but doesn't know the street address - patient reports already receiving his flu vaccine for this season - consider changing his lisinopril to entresto if his BP allows  2: HTN- - BP ok today - saw PCP Gordy Councilman) 11/28/16 and returns next week - BMP from 01/30/17 reviewed and shows sodium 131, potassium 4.0 and GFR >60  3: Dementia- - does have memory loss and says that he's no longertaking aricept - patient says that he's currently managing his medications himself by looking at the bottle directions - living with a friend and relying on them for transportation  4: Fall- - recently fell but is unsure of what exactly happened - denies losing consciousness  Patient did not bring his medications nor a list. Each medication was verbally reviewed with the patient and he was encouraged to bring the bottles to every visit to confirm accuracy of list.  Return here in 1 month or sooner for any questions/problems before then.

## 2017-02-18 DIAGNOSIS — W19XXXA Unspecified fall, initial encounter: Secondary | ICD-10-CM | POA: Insufficient documentation

## 2017-03-17 ENCOUNTER — Ambulatory Visit: Payer: Medicaid Other | Admitting: Family

## 2017-03-24 ENCOUNTER — Ambulatory Visit: Payer: Medicaid Other | Admitting: Family

## 2017-03-31 ENCOUNTER — Emergency Department: Payer: Medicaid Other

## 2017-03-31 ENCOUNTER — Observation Stay
Admission: EM | Admit: 2017-03-31 | Discharge: 2017-04-02 | Disposition: A | Discharge status: Home / Self Care | Payer: Medicaid Other | Attending: Internal Medicine | Admitting: Internal Medicine

## 2017-03-31 ENCOUNTER — Encounter: Payer: Self-pay | Admitting: Emergency Medicine

## 2017-03-31 DIAGNOSIS — D72829 Elevated white blood cell count, unspecified: Secondary | ICD-10-CM | POA: Diagnosis not present

## 2017-03-31 DIAGNOSIS — I110 Hypertensive heart disease with heart failure: Secondary | ICD-10-CM | POA: Insufficient documentation

## 2017-03-31 DIAGNOSIS — Z885 Allergy status to narcotic agent status: Secondary | ICD-10-CM | POA: Insufficient documentation

## 2017-03-31 DIAGNOSIS — K2970 Gastritis, unspecified, without bleeding: Secondary | ICD-10-CM | POA: Diagnosis not present

## 2017-03-31 DIAGNOSIS — I6529 Occlusion and stenosis of unspecified carotid artery: Secondary | ICD-10-CM | POA: Diagnosis not present

## 2017-03-31 DIAGNOSIS — F10920 Alcohol use, unspecified with intoxication, uncomplicated: Secondary | ICD-10-CM

## 2017-03-31 DIAGNOSIS — Z9841 Cataract extraction status, right eye: Secondary | ICD-10-CM | POA: Insufficient documentation

## 2017-03-31 DIAGNOSIS — R55 Syncope and collapse: Secondary | ICD-10-CM | POA: Diagnosis present

## 2017-03-31 DIAGNOSIS — Z720 Tobacco use: Secondary | ICD-10-CM | POA: Diagnosis not present

## 2017-03-31 DIAGNOSIS — T148XXA Other injury of unspecified body region, initial encounter: Secondary | ICD-10-CM

## 2017-03-31 DIAGNOSIS — D696 Thrombocytopenia, unspecified: Secondary | ICD-10-CM | POA: Diagnosis not present

## 2017-03-31 DIAGNOSIS — J449 Chronic obstructive pulmonary disease, unspecified: Secondary | ICD-10-CM | POA: Diagnosis not present

## 2017-03-31 DIAGNOSIS — S2242XA Multiple fractures of ribs, left side, initial encounter for closed fracture: Secondary | ICD-10-CM | POA: Diagnosis not present

## 2017-03-31 DIAGNOSIS — S12691A Other nondisplaced fracture of seventh cervical vertebra, initial encounter for closed fracture: Secondary | ICD-10-CM | POA: Diagnosis not present

## 2017-03-31 DIAGNOSIS — W19XXXA Unspecified fall, initial encounter: Secondary | ICD-10-CM | POA: Insufficient documentation

## 2017-03-31 DIAGNOSIS — Z8249 Family history of ischemic heart disease and other diseases of the circulatory system: Secondary | ICD-10-CM | POA: Insufficient documentation

## 2017-03-31 DIAGNOSIS — K269 Duodenal ulcer, unspecified as acute or chronic, without hemorrhage or perforation: Secondary | ICD-10-CM | POA: Diagnosis not present

## 2017-03-31 DIAGNOSIS — F319 Bipolar disorder, unspecified: Secondary | ICD-10-CM | POA: Diagnosis not present

## 2017-03-31 DIAGNOSIS — E871 Hypo-osmolality and hyponatremia: Secondary | ICD-10-CM | POA: Diagnosis not present

## 2017-03-31 DIAGNOSIS — Z9842 Cataract extraction status, left eye: Secondary | ICD-10-CM | POA: Diagnosis not present

## 2017-03-31 DIAGNOSIS — S0081XA Abrasion of other part of head, initial encounter: Secondary | ICD-10-CM

## 2017-03-31 DIAGNOSIS — Z7982 Long term (current) use of aspirin: Secondary | ICD-10-CM | POA: Insufficient documentation

## 2017-03-31 DIAGNOSIS — F10120 Alcohol abuse with intoxication, uncomplicated: Secondary | ICD-10-CM | POA: Insufficient documentation

## 2017-03-31 DIAGNOSIS — F0390 Unspecified dementia without behavioral disturbance: Secondary | ICD-10-CM | POA: Diagnosis not present

## 2017-03-31 DIAGNOSIS — Z7951 Long term (current) use of inhaled steroids: Secondary | ICD-10-CM | POA: Insufficient documentation

## 2017-03-31 DIAGNOSIS — L8990 Pressure ulcer of unspecified site, unspecified stage: Secondary | ICD-10-CM

## 2017-03-31 DIAGNOSIS — R531 Weakness: Secondary | ICD-10-CM | POA: Insufficient documentation

## 2017-03-31 DIAGNOSIS — Z79899 Other long term (current) drug therapy: Secondary | ICD-10-CM | POA: Insufficient documentation

## 2017-03-31 DIAGNOSIS — E860 Dehydration: Secondary | ICD-10-CM | POA: Insufficient documentation

## 2017-03-31 DIAGNOSIS — Y939 Activity, unspecified: Secondary | ICD-10-CM | POA: Insufficient documentation

## 2017-03-31 DIAGNOSIS — K210 Gastro-esophageal reflux disease with esophagitis: Secondary | ICD-10-CM | POA: Diagnosis not present

## 2017-03-31 DIAGNOSIS — Z833 Family history of diabetes mellitus: Secondary | ICD-10-CM | POA: Insufficient documentation

## 2017-03-31 DIAGNOSIS — S129XXA Fracture of neck, unspecified, initial encounter: Secondary | ICD-10-CM

## 2017-03-31 DIAGNOSIS — I5022 Chronic systolic (congestive) heart failure: Secondary | ICD-10-CM | POA: Diagnosis not present

## 2017-03-31 DIAGNOSIS — E876 Hypokalemia: Secondary | ICD-10-CM | POA: Insufficient documentation

## 2017-03-31 LAB — ETHANOL: Alcohol, Ethyl (B): 228 mg/dL — ABNORMAL HIGH (ref <10)

## 2017-03-31 LAB — TROPONIN I

## 2017-03-31 LAB — CBC
HCT: 36.4 % — ABNORMAL LOW (ref 40.0–52.0)
Hemoglobin: 12.1 g/dL — ABNORMAL LOW (ref 13.0–18.0)
MCH: 34 pg (ref 26.0–34.0)
MCHC: 33.3 g/dL (ref 32.0–36.0)
MCV: 102 fL — ABNORMAL HIGH (ref 80.0–100.0)
PLATELETS: 189 10*3/uL (ref 150–440)
RBC: 3.57 MIL/uL — AB (ref 4.40–5.90)
RDW: 16 % — ABNORMAL HIGH (ref 11.5–14.5)
WBC: 4.5 10*3/uL (ref 3.8–10.6)

## 2017-03-31 LAB — BASIC METABOLIC PANEL
Anion gap: 9 (ref 5–15)
BUN: <5 mg/dL — ABNORMAL LOW (ref 6–20)
CALCIUM: 7.5 mg/dL — AB (ref 8.9–10.3)
CO2: 22 mmol/L (ref 22–32)
CREATININE: 0.66 mg/dL (ref 0.61–1.24)
Chloride: 103 mmol/L (ref 101–111)
GFR calc non Af Amer: >60 mL/min (ref >60)
Glucose, Bld: 90 mg/dL (ref 65–99)
Potassium: 3.5 mmol/L (ref 3.5–5.1)
SODIUM: 134 mmol/L — AB (ref 135–145)

## 2017-03-31 LAB — BRAIN NATRIURETIC PEPTIDE: B Natriuretic Peptide: 205 pg/mL — ABNORMAL HIGH (ref 0.0–100.0)

## 2017-03-31 MED ORDER — SODIUM CHLORIDE 0.9 % IV BOLUS (SEPSIS): 500.0000 mL | Freq: Once | INTRAVENOUS | Status: DC

## 2017-03-31 MED ORDER — SODIUM CHLORIDE 0.9 % IV BOLUS (SEPSIS)
500.0000 mL | Freq: Once | INTRAVENOUS | Status: AC
  Administered 2017-03-31: 500 mL via INTRAVENOUS

## 2017-03-31 NOTE — ED Notes (Signed)
Patient changed out from EMS to ED c-collar by MD.

## 2017-03-31 NOTE — ED Triage Notes (Addendum)
Pt comes from home via [facility] with c/o fall out of bed, patient has had "3 beers" tonight.  Pt also reports a constant burning CP from central chest to abdomen for the last 3 months.  Pt alert and cooperating.  Patient is in [facility] c-collar.  Triage VS WNL.

## 2017-03-31 NOTE — ED Provider Notes (Signed)
[facility] Emergency Department Provider Note   ____________________________________________   First MD Initiated Contact with Patient 03/31/17 2013     (approximate)  I have reviewed the triage vital signs and the nursing notes.   HISTORY  Chief Complaint Fall    HPI Caleb Carpenter is a 61 y.o. male returns for evaluation after passing out.  Patient reports that he been drinking beer this evening, reports he set up from bed to get up when he went to sit up he passed out falling onto the floor.  He denies being in pain, but did have some bleeding from above his left eye, reports he is previously had similar injury about the right eye.  Denies any recent illness, does live with a friend, and reports he only feels a little bit soreness above the left eye.  No new numbness or weakness, but reports he is very weak at baseline.  Does report he had 2-3 beers earlier today and drinks on a regular basis.  No nausea or vomiting.  No abdominal pain.  Reports his chest feels slightly "sore" likely from falling on it.  Denies any heavy chest pain or pressure.  Denies neck pain, back pain or any new weakness.  Past Medical History:  Diagnosis Date  . Asthma   . COPD (chronic obstructive pulmonary disease) (HCC)   . Hypertension     Patient Active Problem List   Diagnosis Date Noted  . Fall 02/18/2017  . Hypokalemia 11/06/2016  . Dementia 11/06/2016  . Chronic systolic heart failure (HCC) 11/01/2016  . Pneumonia 10/10/2016  . Dehydration 08/28/2016  . Pneumothorax 08/07/2016  . General weakness 09/03/2015  . Generalized weakness 09/02/2015  . Hyponatremia 09/02/2015  . HTN (hypertension) 09/02/2015  . GERD (gastroesophageal reflux disease) 09/02/2015  . Hypomagnesemia 06/23/2015  . Leukocytosis 06/23/2015  . Alcohol abuse 06/23/2015  . Thrombocytopenia (HCC) 06/23/2015  . Upper GI bleed 06/21/2015  . Reflux esophagitis   . Gastritis   . Duodenal ulceration      Past Surgical History:  Procedure Laterality Date  . CATARACT EXTRACTION, BILATERAL Bilateral   . ESOPHAGOGASTRODUODENOSCOPY (EGD) WITH PROPOFOL N/A 06/21/2015   Procedure: ESOPHAGOGASTRODUODENOSCOPY (EGD) WITH PROPOFOL;  Surgeon: Midge Minium, MD;  Location: [facility] ENDOSCOPY;  Service: Endoscopy;  Laterality: N/A;    Prior to Admission medications   Medication Sig Start Date End Date Taking? Authorizing Provider  albuterol (PROVENTIL HFA;VENTOLIN HFA) 108 (90 Base) MCG/ACT inhaler Inhale 2 puffs into the lungs 2 (two) times daily.    [provider]  aspirin 81 MG chewable tablet Chew 1 tablet (81 mg total) by mouth daily. 10/13/16   Adrian Saran, MD  buPROPion (WELLBUTRIN XL) 150 MG 24 hr tablet Take 150 mg by mouth daily.    [provider]  carvedilol (COREG) 3.125 MG tablet Take 1 tablet (3.125 mg total) by mouth 2 (two) times daily with a meal. 11/01/16   Delma Freeze, FNP  feeding supplement, ENSURE ENLIVE, (ENSURE ENLIVE) LIQD Take 237 mLs by mouth 2 (two) times daily between meals. 08/13/16   Adrian Saran, MD  Fluticasone-Salmeterol (ADVAIR) 500-50 MCG/DOSE AEPB Inhale 1 puff into the lungs 2 (two) times daily.    [provider]  lisinopril (PRINIVIL,ZESTRIL) 20 MG tablet Take 10 mg by mouth daily.    [provider]  pantoprazole (PROTONIX) 40 MG tablet Take 1 tablet (40 mg total) by mouth daily. 10/13/16   Adrian Saran, MD  potassium chloride SA (K-DUR,KLOR-CON) 20  MEQ tablet Take 20 mEq by mouth daily.    [provider]  rosuvastatin (CRESTOR) 10 MG tablet Take 1 tablet (10 mg total) by mouth daily. 11/01/16   Delma Freeze, FNP  Tiotropium Bromide-Olodaterol (STIOLTO RESPIMAT) 2.5-2.5 MCG/ACT AERS Inhale 2 puffs into the lungs daily.    [provider]    Allergies Codeine  Family History  Problem Relation Age of Onset  . Diabetes Unknown   . Hypertension Unknown     Social History Social History   Tobacco Use  .  Smoking status: Former Smoker    Types: Cigarettes    Last attempt to quit: 06/21/2014    Years since quitting: 2.7  . Smokeless tobacco: Current User    Types: Chew  Substance Use Topics  . Alcohol use: Yes    Alcohol/week: 1.2 oz    Types: 2 Cans of beer per week    Comment: per day  . Drug use: No    Review of Systems Constitutional: No fever/chills Eyes: No visual changes. ENT: No sore throat.  Denies neck pain Cardiovascular: Denies chest pain reports it feels "sore" across the front of his breastbone. Respiratory: Denies shortness of breath. Gastrointestinal: No abdominal pain.  No nausea, no vomiting.  No diarrhea.  No constipation. Genitourinary: Negative for dysuria. Musculoskeletal: Negative for back pain. Skin: Negative for rash except for slight abrasion above left eye. Neurological: Negative for headaches, focal weakness or numbness.    ____________________________________________   PHYSICAL EXAM:  VITAL SIGNS: ED Triage Vitals  Enc Vitals Group     BP 03/31/17 1940 109/89     Pulse Rate 03/31/17 1940 73     Resp 03/31/17 1940 18     Temp 03/31/17 1940 (!) 97.5 F (36.4 C)     Temp Source 03/31/17 1940 Oral     SpO2 03/31/17 1940 98 %     Weight --      Height --      Head Circumference --      Peak Flow --      Pain Score 03/31/17 1939 10     Pain Loc --      Pain Edu? --      Excl. in GC? --     Constitutional: Alert and oriented.  Rather disheveled, cervical collar in place.  In no distress.  He is a pleasant Eyes: Conjunctivae are normal. Head: Atraumatic except for a small abrasion above the left orbit superiorly without laceration bleeding is controlled.  Very small abrasion over the right anterior jawline without laceration, bleeding controlled.  Opens and closes the jaw without pain or difficulty. Nose: No congestion/rhinnorhea. Mouth/Throat: Mucous membranes are slightly dry Neck: No stridor.  Cervical collar in place.  No step-offs or  deformity or tenderness midline, denies tenderness or pain along the thoracic and lumbar spine. Cardiovascular: Normal rate, regular rhythm. Grossly normal heart sounds.  Good peripheral circulation. Respiratory: Normal respiratory effort.  No retractions. Lungs CTAB. Gastrointestinal: Soft and nontender. No distention. Musculoskeletal: No lower extremity tenderness nor edema.  He is very cachectic in all extremities.  Generally weak, but demonstrates 5 out of 5 strength without any focal deficits. Neurologic:  Normal speech and language. No gross focal neurologic deficits are appreciated.  Normal cranial nerve exam.  Normal extraocular movements. Skin:  Skin is warm, dry and intact. No rash noted. Psychiatric: Mood and affect are normal. Speech and behavior are flat.  ____________________________________________   LABS (all labs ordered are listed,  but only abnormal results are displayed)  Labs Reviewed  BASIC METABOLIC PANEL - Abnormal; Notable for the following components:      Result Value   Sodium 134 (*)    BUN <5 (*)    Calcium 7.5 (*)    All other components within normal limits  CBC - Abnormal; Notable for the following components:   RBC 3.57 (*)    Hemoglobin 12.1 (*)    HCT 36.4 (*)    MCV 102.0 (*)    RDW 16.0 (*)    All other components within normal limits  ETHANOL - Abnormal; Notable for the following components:   Alcohol, Ethyl (B) 228 (*)    All other components within normal limits  BRAIN NATRIURETIC PEPTIDE - Abnormal; Notable for the following components:   B Natriuretic Peptide 205.0 (*)    All other components within normal limits  TROPONIN I   ____________________________________________  EKG  Reviewed and interpreted by me at 1935 Heart rate 80 Here is 85 QTC 490 PR approximately 200.  Normal sinus rhythm no evidence of ischemia or ectopy, some slight artifact ____________________________________________  RADIOLOGY  Ct Head Wo  Contrast  Result Date: 03/31/2017 CLINICAL DATA:  Status post fall. EXAM: CT HEAD WITHOUT CONTRAST CT CERVICAL SPINE WITHOUT CONTRAST TECHNIQUE: Multidetector CT imaging of the head and cervical spine was performed following the standard protocol without intravenous contrast. Multiplanar CT image reconstructions of the cervical spine were also generated. COMPARISON:  Head CTs dated 01/30/2017, 10/10/2016 and 06/26/2005. Cervical spine CT dated 10/10/2016. FINDINGS: CT HEAD FINDINGS Brain: Again noted is generalized parenchymal atrophy with commensurate dilatation of the ventricles and sulci. Chronic small vessel ischemic changes again noted within the bilateral periventricular white matter regions. There is no mass, hemorrhage, edema or other evidence of acute parenchymal abnormality. No extra-axial hemorrhage. Vascular: There are chronic calcified atherosclerotic changes of the large vessels at the skull base. No unexpected hyperdense vessel. Skull: Normal. Negative for fracture or focal lesion. Sinuses/Orbits: No acute findings. Other: None. CT CERVICAL SPINE FINDINGS Alignment: Stable.  No evidence of acute vertebral body subluxation. Skull base and vertebrae: Interval fracture at the posterior aspect of the C7 spinous process, but appears subacute in age. No acute appearing fracture. Soft tissues and spinal canal: No prevertebral fluid or swelling. No visible canal hematoma. Disc levels: Mild degenerative change again noted within the lower cervical spine, stable. No significant central canal stenosis at any level. Upper chest: No acute findings. Scarring/fibrosis and emphysematous blebs at each lung apex. Other: Carotid atherosclerosis. IMPRESSION: 1. No acute intracranial abnormality. No intracranial mass, hemorrhage or edema. No skull fracture. Chronic small vessel ischemic changes again noted within the white matter. 2. Fracture within the posterior aspect of the C7 spinous process, new compared to the  earlier CT of 10/10/2016, favored to be subacute in age based on appearance, less likely acute. No acute vertebral body fracture or subluxation. Electronically Signed   By: Bary Richard M.D.   On: 03/31/2017 20:58   Ct Cervical Spine Wo Contrast  Result Date: 03/31/2017 CLINICAL DATA:  Status post fall. EXAM: CT HEAD WITHOUT CONTRAST CT CERVICAL SPINE WITHOUT CONTRAST TECHNIQUE: Multidetector CT imaging of the head and cervical spine was performed following the standard protocol without intravenous contrast. Multiplanar CT image reconstructions of the cervical spine were also generated. COMPARISON:  Head CTs dated 01/30/2017, 10/10/2016 and 06/26/2005. Cervical spine CT dated 10/10/2016. FINDINGS: CT HEAD FINDINGS Brain: Again noted is generalized parenchymal atrophy with commensurate dilatation  of the ventricles and sulci. Chronic small vessel ischemic changes again noted within the bilateral periventricular white matter regions. There is no mass, hemorrhage, edema or other evidence of acute parenchymal abnormality. No extra-axial hemorrhage. Vascular: There are chronic calcified atherosclerotic changes of the large vessels at the skull base. No unexpected hyperdense vessel. Skull: Normal. Negative for fracture or focal lesion. Sinuses/Orbits: No acute findings. Other: None. CT CERVICAL SPINE FINDINGS Alignment: Stable.  No evidence of acute vertebral body subluxation. Skull base and vertebrae: Interval fracture at the posterior aspect of the C7 spinous process, but appears subacute in age. No acute appearing fracture. Soft tissues and spinal canal: No prevertebral fluid or swelling. No visible canal hematoma. Disc levels: Mild degenerative change again noted within the lower cervical spine, stable. No significant central canal stenosis at any level. Upper chest: No acute findings. Scarring/fibrosis and emphysematous blebs at each lung apex. Other: Carotid atherosclerosis. IMPRESSION: 1. No acute intracranial  abnormality. No intracranial mass, hemorrhage or edema. No skull fracture. Chronic small vessel ischemic changes again noted within the white matter. 2. Fracture within the posterior aspect of the C7 spinous process, new compared to the earlier CT of 10/10/2016, favored to be subacute in age based on appearance, less likely acute. No acute vertebral body fracture or subluxation. Electronically Signed   By: Bary Richard M.D.   On: 03/31/2017 20:58   Dg Chest Portable 1 View  Result Date: 03/31/2017 CLINICAL DATA:  Fall EXAM: PORTABLE CHEST 1 VIEW COMPARISON:  01/30/2017 FINDINGS: Hyperinflation. No acute consolidation or effusion. Emphysematous changes. Normal cardiomediastinal silhouette. No pneumothorax. Multiple old left-sided rib fractures. Vascular calcifications. Carotid vessel calcification. Probable old distal right clavicle fracture. Partially visualized right humeral neck fracture. IMPRESSION: Hyperinflation with emphysematous disease. No acute infiltrate or effusion. Negative for a pneumothorax. Electronically Signed   By: Jasmine Pang M.D.   On: 03/31/2017 22:37    Chest x-ray reviewed, somewhat hyperinflated but no acute infiltrate ____________________________________________   PROCEDURES  Procedure(s) performed: None  Procedures  Critical Care performed: No  ____________________________________________   INITIAL IMPRESSION / ASSESSMENT AND PLAN / ED COURSE  Pertinent labs & imaging results that were available during my care of the patient were reviewed by me and considered in my medical decision making (see chart for details).  Patient presents for evaluation of a syncopal episode.  Presently is neurologically intact, denies any acute pulmonary symptoms, no headache, denies neck pain.  Does have a small abrasion over the left eye and over the right jaw bone without deformity or notable hematoma.  Patient does not carry a history of cardiomyopathy, and given his associated  syncope patient will require workup.  He also reports use of alcohol the CT of the head and cervical was taken which reveals a cervical spine fracture.  Patient is alert, oriented, and understanding of his neck fracture and need for admission.  He did have some slightly reduced blood pressures and mild hypotension with responded to fluid bolus.  Currently resting comfortably.  EKG and first troponin did not indicate any acute cardiac etiology and chest x-ray does not reveal any traumatic injury.  Clinical Course as of Apr 01 13  Mon Mar 31, 2017  2016 Patient at CT, will see when he returns  [MQ]  2124 C7 spinous process. Dr. Adriana Simas paged ([place]) to discuss. C-collar remaisn in place.  CT Cervical Spine Wo Contrast [MQ]  2129 Dr. Adriana Simas advises keep in cervical collar, does not appear unstable.  [MQ]  2240  spinal motion restriction.  Patient cervical collar changed to a [place] collar with in-line stabilization.  Patient tolerated well, reports much more comfortable with [place] collar.  Patient understands he is being admitted to the hospital, currently voices no complaints, denies neck pain I question if this may be a subacute fracture given his clinical history, but nevertheless will keep him in his cervical collar which she is agreeable with.  [MQ]    Clinical Course User Index [MQ] Sharyn CreamerQuale, Mark, MD     ____________________________________________   FINAL CLINICAL IMPRESSION(S) / ED DIAGNOSES  Final diagnoses:  Other closed nondisplaced fracture of seventh cervical vertebra, initial encounter (HCC)  Closed fracture of spinous process of cervical vertebra, initial encounter (HCC)  Syncope and collapse  Alcoholic intoxication without complication (HCC)  Abrasion, face w/o infection      NEW MEDICATIONS STARTED DURING THIS VISIT:  This SmartLink is deprecated. Use AVSMEDLIST instead to display the medication list for a patient.   Note:  This document was prepared using  Dragon voice recognition software and may include unintentional dictation errors.     Sharyn CreamerQuale, Mark, MD 04/01/17 [phone]

## 2017-04-01 ENCOUNTER — Other Ambulatory Visit: Payer: Self-pay

## 2017-04-01 DIAGNOSIS — R55 Syncope and collapse: Secondary | ICD-10-CM | POA: Diagnosis present

## 2017-04-01 DIAGNOSIS — L8990 Pressure ulcer of unspecified site, unspecified stage: Secondary | ICD-10-CM

## 2017-04-01 LAB — TSH: TSH: 0.675 u[IU]/mL (ref 0.350–4.500)

## 2017-04-01 MED ORDER — ACETAMINOPHEN 650 MG RE SUPP: 650.0000 mg | Freq: Four times a day (QID) | RECTAL | Status: DC | PRN

## 2017-04-01 MED ORDER — ENOXAPARIN SODIUM 40 MG/0.4ML ~~LOC~~ SOLN: 40.0000 mg | SUBCUTANEOUS | Status: DC

## 2017-04-01 MED ORDER — POTASSIUM CHLORIDE CRYS ER 20 MEQ PO TBCR
20.0000 meq | EXTENDED_RELEASE_TABLET | Freq: Every day | ORAL | Status: DC
  Administered 2017-04-01 – 2017-04-02 (×2): 20 meq via ORAL
  Filled 2017-04-01 (×2): qty 1

## 2017-04-01 MED ORDER — ENOXAPARIN SODIUM 30 MG/0.3ML ~~LOC~~ SOLN
30.0000 mg | SUBCUTANEOUS | Status: DC
  Administered 2017-04-02: 30 mg via SUBCUTANEOUS
  Filled 2017-04-01: qty 0.3

## 2017-04-01 MED ORDER — DOCUSATE SODIUM 100 MG PO CAPS
100.0000 mg | ORAL_CAPSULE | Freq: Two times a day (BID) | ORAL | Status: DC
  Administered 2017-04-01 – 2017-04-02 (×3): 100 mg via ORAL
  Filled 2017-04-01 (×3): qty 1

## 2017-04-01 MED ORDER — ROSUVASTATIN CALCIUM 10 MG PO TABS
10.0000 mg | ORAL_TABLET | Freq: Every day | ORAL | Status: DC
  Administered 2017-04-01: 10 mg via ORAL
  Filled 2017-04-01: qty 1

## 2017-04-01 MED ORDER — ONDANSETRON HCL 4 MG/2ML IJ SOLN: 4.0000 mg | Freq: Four times a day (QID) | INTRAMUSCULAR | Status: DC | PRN

## 2017-04-01 MED ORDER — LISINOPRIL 10 MG PO TABS
10.0000 mg | ORAL_TABLET | Freq: Every day | ORAL | Status: DC
Start: 2017-04-01 — End: 2017-04-02
  Administered 2017-04-01 – 2017-04-02 (×2): 10 mg via ORAL
  Filled 2017-04-01 (×2): qty 1

## 2017-04-01 MED ORDER — ACETAMINOPHEN 325 MG PO TABS: 650.0000 mg | ORAL_TABLET | Freq: Four times a day (QID) | ORAL | Status: DC | PRN

## 2017-04-01 MED ORDER — PANTOPRAZOLE SODIUM 40 MG PO TBEC
40.0000 mg | DELAYED_RELEASE_TABLET | Freq: Every day | ORAL | Status: DC
  Administered 2017-04-01 – 2017-04-02 (×2): 40 mg via ORAL
  Filled 2017-04-01 (×2): qty 1

## 2017-04-01 MED ORDER — SODIUM CHLORIDE 0.9 % IV BOLUS (SEPSIS)
1000.0000 mL | Freq: Once | INTRAVENOUS | Status: AC
  Administered 2017-04-01: 1000 mL via INTRAVENOUS

## 2017-04-01 MED ORDER — ASPIRIN 81 MG PO CHEW
81.0000 mg | CHEWABLE_TABLET | Freq: Every day | ORAL | Status: DC
  Administered 2017-04-01 – 2017-04-02 (×2): 81 mg via ORAL
  Filled 2017-04-01 (×2): qty 1

## 2017-04-01 MED ORDER — ENSURE ENLIVE PO LIQD
237.0000 mL | Freq: Two times a day (BID) | ORAL | Status: DC
  Administered 2017-04-01 – 2017-04-02 (×3): 237 mL via ORAL

## 2017-04-01 MED ORDER — ONDANSETRON HCL 4 MG PO TABS: 4.0000 mg | ORAL_TABLET | Freq: Four times a day (QID) | ORAL | Status: DC | PRN

## 2017-04-01 MED ORDER — BUPROPION HCL ER (XL) 150 MG PO TB24
150.0000 mg | ORAL_TABLET | Freq: Every day | ORAL | Status: DC
  Administered 2017-04-01 – 2017-04-02 (×2): 150 mg via ORAL
  Filled 2017-04-01 (×2): qty 1

## 2017-04-01 MED ORDER — CARVEDILOL 3.125 MG PO TABS
3.1250 mg | ORAL_TABLET | Freq: Two times a day (BID) | ORAL | Status: DC
  Administered 2017-04-01 – 2017-04-02 (×3): 3.125 mg via ORAL
  Filled 2017-04-01 (×3): qty 1

## 2017-04-01 MED ORDER — SODIUM CHLORIDE 0.9 % IV SOLN: INTRAVENOUS | Status: DC

## 2017-04-01 MED ORDER — MOMETASONE FURO-FORMOTEROL FUM 200-5 MCG/ACT IN AERO
2.0000 | INHALATION_SPRAY | Freq: Two times a day (BID) | RESPIRATORY_TRACT | Status: DC
  Administered 2017-04-01 – 2017-04-02 (×3): 2 via RESPIRATORY_TRACT
  Filled 2017-04-01: qty 8.8

## 2017-04-01 MED ORDER — SODIUM CHLORIDE 0.9 % IV SOLN
INTRAVENOUS | Status: DC
  Administered 2017-04-01 (×2): via INTRAVENOUS

## 2017-04-01 NOTE — Progress Notes (Signed)
Patient on a low bed, alert and oriented x 4.  Denies pain.  C collar on. Patient takes this off at times to eat. Family at bedside.

## 2017-04-01 NOTE — ED Notes (Signed)
Patient found c-collar removed again.  I explained to patient the need to keep it on and he does not want it back on.  I will pass this information to the next nurse.

## 2017-04-01 NOTE — Progress Notes (Signed)
The patient has no complaint. VS reviewed. F/u syncope workup and continue current treatment.  Time spent: 20 minutes.

## 2017-04-01 NOTE — Progress Notes (Signed)
Pharmacist - Prescriber Communication  Enoxaparin dose modified from 40 mg subcutaneously once daily to 30 mg subcutaneously due to actual body weight less than 50 kg.  Kynzlie Hilleary A. [place], [place].D., BCPS Clinical Pharmacist 04/01/2017 07:38

## 2017-04-01 NOTE — ED Notes (Signed)
C-collar found removed again in patient bed.  I re-educated patient and re-applied it.

## 2017-04-01 NOTE — ED Notes (Signed)
C-collar found in bed removed by patient.  Woke patient and told him the importance of keeping it on, patient voiced understanding and I reapplied it.

## 2017-04-01 NOTE — ED Notes (Signed)
Attempted report x1, I was told they would call back

## 2017-04-01 NOTE — H&P (Signed)
Caleb Carpenter is an 61 y.o. male.   Chief Complaint: Fall HPI: The patient with past medical history of COPD and asthma presents to the emergency department after suffering a fall.  The patient admits to drinking beer this evening when he tried to stand and passed out.  The patient admits to hitting his eye/head and injuring the lateral aspect of his chest.  He denies shortness of breath, nausea, vomiting or diaphoresis.  CT of the patient's head and neck revealed a spinous process fracture of C7.  The patient was placed in a brace prior to the emergency department staff calling the hospitalist service for further evaluation of syncope.  Past Medical History:  Diagnosis Date  . Asthma   . COPD (chronic obstructive pulmonary disease) ([place])   . Hypertension     Past Surgical History:  Procedure Laterality Date  . CATARACT EXTRACTION, BILATERAL Bilateral   . ESOPHAGOGASTRODUODENOSCOPY (EGD) WITH PROPOFOL N/A 06/21/2015   Procedure: ESOPHAGOGASTRODUODENOSCOPY (EGD) WITH PROPOFOL;  Surgeon: Lucilla Lame, MD;  Location: [facility] ENDOSCOPY;  Service: Endoscopy;  Laterality: N/A;    Family History  Problem Relation Age of Onset  . Diabetes Unknown   . Hypertension Unknown    Social History:  reports that he quit smoking about 2 years ago. His smoking use included cigarettes. His smokeless tobacco use includes chew. He reports that he drinks about 1.2 oz of alcohol per week. He reports that he does not use drugs.  Allergies:  Allergies  Allergen Reactions  . Codeine Other (See Comments)    Nasal drip    Medications Prior to Admission  Medication Sig Dispense Refill  . albuterol (PROVENTIL HFA;VENTOLIN HFA) 108 (90 Base) MCG/ACT inhaler Inhale 2 puffs into the lungs 2 (two) times daily.    Marland Kitchen aspirin 81 MG chewable tablet Chew 1 tablet (81 mg total) by mouth daily.    Marland Kitchen buPROPion (WELLBUTRIN XL) 150 MG 24 hr tablet Take 150 mg by mouth daily.    . carvedilol (COREG) 3.125 MG tablet Take 1 tablet  (3.125 mg total) by mouth 2 (two) times daily with a meal. 60 tablet 5  . feeding supplement, ENSURE ENLIVE, (ENSURE ENLIVE) LIQD Take 237 mLs by mouth 2 (two) times daily between meals. 237 mL 12  . Fluticasone-Salmeterol (ADVAIR) 500-50 MCG/DOSE AEPB Inhale 1 puff into the lungs 2 (two) times daily.    Marland Kitchen lisinopril (PRINIVIL,ZESTRIL) 20 MG tablet Take 10 mg by mouth daily.    . pantoprazole (PROTONIX) 40 MG tablet Take 1 tablet (40 mg total) by mouth daily. 60 tablet 0  . potassium chloride SA (K-DUR,KLOR-CON) 20 MEQ tablet Take 20 mEq by mouth daily.    . rosuvastatin (CRESTOR) 10 MG tablet Take 1 tablet (10 mg total) by mouth daily. 30 tablet 5  . Tiotropium Bromide-Olodaterol (STIOLTO RESPIMAT) 2.5-2.5 MCG/ACT AERS Inhale 2 puffs into the lungs daily.      Results for orders placed or performed during the hospital encounter of 03/31/17 (from the past 48 hour(s))  Basic metabolic panel     Status: Abnormal   Collection Time: 03/31/17  7:46 PM  Result Value Ref Range   Sodium 134 (L) 135 - 145 mmol/L   Potassium 3.5 3.5 - 5.1 mmol/L   Chloride 103 101 - 111 mmol/L   CO2 22 22 - 32 mmol/L   Glucose, Bld 90 65 - 99 mg/dL   BUN <5 (L) 6 - 20 mg/dL   Creatinine, Ser 0.66 0.61 - 1.24 mg/dL  Calcium 7.5 (L) 8.9 - 10.3 mg/dL   GFR calc non Af Amer >60 >60 mL/min   GFR calc Af Amer >60 >60 mL/min    Comment: (NOTE) The eGFR has been calculated using the CKD EPI equation. This calculation has not been validated in all clinical situations. eGFR's persistently <60 mL/min signify possible Chronic Kidney Disease.    Anion gap 9 5 - 15  CBC     Status: Abnormal   Collection Time: 03/31/17  7:46 PM  Result Value Ref Range   WBC 4.5 3.8 - 10.6 K/uL   RBC 3.57 (L) 4.40 - 5.90 MIL/uL   Hemoglobin 12.1 (L) 13.0 - 18.0 g/dL   HCT 36.4 (L) 40.0 - 52.0 %   MCV 102.0 (H) 80.0 - 100.0 fL   MCH 34.0 26.0 - 34.0 pg   MCHC 33.3 32.0 - 36.0 g/dL   RDW 16.0 (H) 11.5 - 14.5 %   Platelets 189 150 -  440 K/uL  Troponin I     Status: None   Collection Time: 03/31/17  7:46 PM  Result Value Ref Range   Troponin I <0.03 <0.03 ng/mL  Ethanol     Status: Abnormal   Collection Time: 03/31/17  7:46 PM  Result Value Ref Range   Alcohol, Ethyl (B) 228 (H) <10 mg/dL    Comment:        LOWEST DETECTABLE LIMIT FOR SERUM ALCOHOL IS 10 mg/dL FOR MEDICAL PURPOSES ONLY   Brain natriuretic peptide     Status: Abnormal   Collection Time: 03/31/17  7:46 PM  Result Value Ref Range   B Natriuretic Peptide 205.0 (H) 0.0 - 100.0 pg/mL   Ct Head Wo Contrast  Result Date: 03/31/2017 CLINICAL DATA:  Status post fall. EXAM: CT HEAD WITHOUT CONTRAST CT CERVICAL SPINE WITHOUT CONTRAST TECHNIQUE: Multidetector CT imaging of the head and cervical spine was performed following the standard protocol without intravenous contrast. Multiplanar CT image reconstructions of the cervical spine were also generated. COMPARISON:  Head CTs dated 01/30/2017, 10/10/2016 and 06/26/2005. Cervical spine CT dated 10/10/2016. FINDINGS: CT HEAD FINDINGS Brain: Again noted is generalized parenchymal atrophy with commensurate dilatation of the ventricles and sulci. Chronic small vessel ischemic changes again noted within the bilateral periventricular white matter regions. There is no mass, hemorrhage, edema or other evidence of acute parenchymal abnormality. No extra-axial hemorrhage. Vascular: There are chronic calcified atherosclerotic changes of the large vessels at the skull base. No unexpected hyperdense vessel. Skull: Normal. Negative for fracture or focal lesion. Sinuses/Orbits: No acute findings. Other: None. CT CERVICAL SPINE FINDINGS Alignment: Stable.  No evidence of acute vertebral body subluxation. Skull base and vertebrae: Interval fracture at the posterior aspect of the C7 spinous process, but appears subacute in age. No acute appearing fracture. Soft tissues and spinal canal: No prevertebral fluid or swelling. No visible canal  hematoma. Disc levels: Mild degenerative change again noted within the lower cervical spine, stable. No significant central canal stenosis at any level. Upper chest: No acute findings. Scarring/fibrosis and emphysematous blebs at each lung apex. Other: Carotid atherosclerosis. IMPRESSION: 1. No acute intracranial abnormality. No intracranial mass, hemorrhage or edema. No skull fracture. Chronic small vessel ischemic changes again noted within the white matter. 2. Fracture within the posterior aspect of the C7 spinous process, new compared to the earlier CT of 10/10/2016, favored to be subacute in age based on appearance, less likely acute. No acute vertebral body fracture or subluxation. Electronically Signed   By: Franki Cabot  M.D.   On: 03/31/2017 20:58   Ct Cervical Spine Wo Contrast  Result Date: 03/31/2017 CLINICAL DATA:  Status post fall. EXAM: CT HEAD WITHOUT CONTRAST CT CERVICAL SPINE WITHOUT CONTRAST TECHNIQUE: Multidetector CT imaging of the head and cervical spine was performed following the standard protocol without intravenous contrast. Multiplanar CT image reconstructions of the cervical spine were also generated. COMPARISON:  Head CTs dated 01/30/2017, 10/10/2016 and 06/26/2005. Cervical spine CT dated 10/10/2016. FINDINGS: CT HEAD FINDINGS Brain: Again noted is generalized parenchymal atrophy with commensurate dilatation of the ventricles and sulci. Chronic small vessel ischemic changes again noted within the bilateral periventricular white matter regions. There is no mass, hemorrhage, edema or other evidence of acute parenchymal abnormality. No extra-axial hemorrhage. Vascular: There are chronic calcified atherosclerotic changes of the large vessels at the skull base. No unexpected hyperdense vessel. Skull: Normal. Negative for fracture or focal lesion. Sinuses/Orbits: No acute findings. Other: None. CT CERVICAL SPINE FINDINGS Alignment: Stable.  No evidence of acute vertebral body  subluxation. Skull base and vertebrae: Interval fracture at the posterior aspect of the C7 spinous process, but appears subacute in age. No acute appearing fracture. Soft tissues and spinal canal: No prevertebral fluid or swelling. No visible canal hematoma. Disc levels: Mild degenerative change again noted within the lower cervical spine, stable. No significant central canal stenosis at any level. Upper chest: No acute findings. Scarring/fibrosis and emphysematous blebs at each lung apex. Other: Carotid atherosclerosis. IMPRESSION: 1. No acute intracranial abnormality. No intracranial mass, hemorrhage or edema. No skull fracture. Chronic small vessel ischemic changes again noted within the white matter. 2. Fracture within the posterior aspect of the C7 spinous process, new compared to the earlier CT of 10/10/2016, favored to be subacute in age based on appearance, less likely acute. No acute vertebral body fracture or subluxation. Electronically Signed   By: Franki Cabot M.D.   On: 03/31/2017 20:58   Dg Chest Portable 1 View  Result Date: 03/31/2017 CLINICAL DATA:  Fall EXAM: PORTABLE CHEST 1 VIEW COMPARISON:  01/30/2017 FINDINGS: Hyperinflation. No acute consolidation or effusion. Emphysematous changes. Normal cardiomediastinal silhouette. No pneumothorax. Multiple old left-sided rib fractures. Vascular calcifications. Carotid vessel calcification. Probable old distal right clavicle fracture. Partially visualized right humeral neck fracture. IMPRESSION: Hyperinflation with emphysematous disease. No acute infiltrate or effusion. Negative for a pneumothorax. Electronically Signed   By: Donavan Foil M.D.   On: 03/31/2017 22:37    Review of Systems  Constitutional: Negative for chills and fever.  HENT: Negative for sore throat and tinnitus.   Eyes: Negative for blurred vision and redness.  Respiratory: Negative for cough and shortness of breath.   Cardiovascular: Negative for chest pain, palpitations,  orthopnea and PND.  Gastrointestinal: Negative for abdominal pain, diarrhea, nausea and vomiting.  Genitourinary: Negative for dysuria, frequency and urgency.  Musculoskeletal: Negative for joint pain and myalgias.  Skin: Negative for rash.       No lesions  Neurological: Negative for speech change, focal weakness and weakness.  Endo/Heme/Allergies: Does not bruise/bleed easily.       No temperature intolerance  Psychiatric/Behavioral: Negative for depression and suicidal ideas.    Blood pressure 108/79, pulse 95, temperature (!) 97.5 F (36.4 C), temperature source Oral, resp. rate (!) 33, SpO2 (!) 85 %. Physical Exam   Assessment/Plan 61 year old male admitted for syncope. 1.  Syncope: Multifactorial including orthostasis, alcohol intoxication and poor cardiac output.  Obtain echocardiogram.  Consult cardiology. 2.  Cervical spine fracture: [place] collar in place.  Emergency department staff discussed with neurosurgeon 3.  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  Patient has received normal saline in the emergency department.  Encourage p.o. intake. 4.  Alcohol abuse: Currently the patient is mildly intoxicated.  Initiate CIWA scale after 48 hours if the patient is admitted that long. 5.  CHF: BNP only mildly elevated.  No pulmonary or peripheral edema.  May tolerate gentle hydration if necessary. 6.  DVT prophylaxis: Lovenox 7.  GI prophylaxis: Pantoprazole per home regimen The patient is a full code.  Time spent on admission orders and patient care approximately 45 minutes   Harrie Foreman, MD 04/01/2017, 2:57 AM

## 2017-04-01 NOTE — ED Notes (Signed)
Pt transported to room 136 

## 2017-04-01 NOTE — Consult Note (Signed)
Neurosurgery-New Consultation Evaluation 04/01/2017 Caleb Carpenter [id number]  Identifying Statement: Caleb Carpenter is a 61 y.o. male from [place] [address] with spinous process fracture   Physician Requesting Consultation: [facility] ED  History of Present Illness: Caleb Carpenter is admitted after a fall. He was admitted given concern for the fall and intoxication. Imaging was obtained of his head and cervical spine and a small spinous process fracture was seen. He was placed in a collar  He denies any neck pain or headache today. He has no numbness or weakness in his extremities. He denies any radiating pain.   Past Medical History:  Past Medical History:  Diagnosis Date  . Asthma   . COPD (chronic obstructive pulmonary disease) ([facility])   . Hypertension     Social History: Social History   Socioeconomic History  . Marital status: Legally Separated    Spouse name: Not on file  . Number of children: Not on file  . Years of education: Not on file  . Highest education level: Not on file  Social Needs  . Financial resource strain: Not on file  . Food insecurity - worry: Not on file  . Food insecurity - inability: Not on file  . Transportation needs - medical: Not on file  . Transportation needs - non-medical: Not on file  Occupational History  . Occupation: retired  Tobacco Use  . Smoking status: Former Smoker    Types: Cigarettes    Last attempt to quit: 06/21/2014    Years since quitting: 2.7  . Smokeless tobacco: Current User    Types: Chew  Substance and Sexual Activity  . Alcohol use: Yes    Alcohol/week: 1.2 oz    Types: 2 Cans of beer per week    Comment: per day  . Drug use: No  . Sexual activity: No  Other Topics Concern  . Not on file  Social History Narrative  . Not on file     Family History: Family History  Problem Relation Age of Onset  . Diabetes Unknown   . Hypertension Unknown     Review of Systems:  Review of Systems - General ROS:  Negative Psychological ROS: Negative Ophthalmic ROS: Negative ENT ROS: Negative Hematological and Lymphatic ROS: Negative  Endocrine ROS: Negative Respiratory ROS: Negative Cardiovascular ROS: Negative Gastrointestinal ROS: Negative Genito-Urinary ROS: Negative Musculoskeletal ROS: Negative for neck pain Neurological ROS: Negative for numbness or weakness Dermatological ROS: Negative  Physical Exam: BP 108/68 (BP Location: Left Arm)   Pulse 78   Temp 99.2 F (37.3 C) (Oral)   Resp 19   Wt 39.9 kg (88 lb)   SpO2 95%   BMI 14.64 kg/m  Body mass index is 14.64 kg/m. Body surface area is 1.35 meters squared. General appearance: Alert, cooperative, in no acute distress Head: Normocephalic, atraumatic Eyes: Normal, EOM intact Oropharynx: Moist without lesions Neck: Supple, no tenderness, cervical collar in place Ext: No edema in LE bilaterally, warm to touch, bruising noted in UE  Neurologic exam:  Mental status: alertness: alert,  affect: normal Speech: fluent and clear Motor:strength symmetric 5/5, normal muscle mass and tone in all extremities  Sensory: intact to light touch in all extremities Gait: Not tested   Laboratory: Results for orders placed or performed during the hospital encounter of 03/31/17  Basic metabolic panel  Result Value Ref Range   Sodium 134 (L) 135 - 145 mmol/L   Potassium 3.5 3.5 - 5.1 mmol/L   Chloride 103 101 -  111 mmol/L   CO2 22 22 - 32 mmol/L   Glucose, Bld 90 65 - 99 mg/dL   BUN <5 (L) 6 - 20 mg/dL   Creatinine, Ser 6.570.66 0.61 - 1.24 mg/dL   Calcium 7.5 (L) 8.9 - 10.3 mg/dL   GFR calc non Af Amer >60 >60 mL/min   GFR calc Af Amer >60 >60 mL/min   Anion gap 9 5 - 15  CBC  Result Value Ref Range   WBC 4.5 3.8 - 10.6 K/uL   RBC 3.57 (L) 4.40 - 5.90 MIL/uL   Hemoglobin 12.1 (L) 13.0 - 18.0 g/dL   HCT 84.636.4 (L) 96.240.0 - 95.252.0 %   MCV 102.0 (H) 80.0 - 100.0 fL   MCH 34.0 26.0 - 34.0 pg   MCHC 33.3 32.0 - 36.0 g/dL   RDW 84.116.0 (H) 32.411.5 -  14.5 %   Platelets 189 150 - 440 K/uL  Troponin I  Result Value Ref Range   Troponin I <0.03 <0.03 ng/mL  Ethanol  Result Value Ref Range   Alcohol, Ethyl (B) 228 (H) <10 mg/dL  Brain natriuretic peptide  Result Value Ref Range   B Natriuretic Peptide 205.0 (H) 0.0 - 100.0 pg/mL  TSH  Result Value Ref Range   TSH 0.675 0.350 - 4.500 uIU/mL   I personally reviewed labs  Imaging: CT Head:  No acute intracranial abnormality. No intracranial mass, hemorrhage or edema. No skull fracture. Chronic small vessel ischemic changes again noted within the white matter.  CT Cervical Spine:  Fracture within the posterior aspect of the C7 spinous process,new compared to the earlier CT of 10/10/2016, favored to be subacute in age based on appearance, less likely acute. No acute vertebral body fracture or subluxation.   Impression/Plan:   Caleb Carpenter is here with a small, non-structural cervical spinous process fracture. I do not see any other fracture and patient is asymptomatic. He can be cleared with flex/ex cervical xray and if no abnormal motion, have collar removed.   1.  Diagnosis: Cervical spinous process fracture  2.  Plan - Recommend flex/ex cervical spine xray, please page when completed - If no motion, can clear collar. No follow up needed

## 2017-04-01 NOTE — Progress Notes (Signed)
Pt alert but can be forgetful at times. Likes to remove c-collar at times.

## 2017-04-02 ENCOUNTER — Observation Stay: Payer: Medicaid Other

## 2017-04-02 LAB — HEMOGLOBIN A1C
Hgb A1c MFr Bld: 4.8 % (ref 4.8–5.6)
Mean Plasma Glucose: 91 mg/dL

## 2017-04-02 NOTE — Discharge Summary (Addendum)
[facility] - [place] at [facility]   PATIENT NAME: Henley Boettner    MR#:  [id number]  DATE OF BIRTH:  11-22-1955  DATE OF ADMISSION:  03/31/2017   ADMITTING PHYSICIAN: Arnaldo Natal, MD  DATE OF DISCHARGE: 04/02/2017  PRIMARY CARE PHYSICIAN: Evelene Croon, MD   ADMISSION DIAGNOSIS:  Syncope and collapse [R55] Abrasion, face w/o infection [S00.81XA] Closed fracture of spinous process of cervical vertebra, initial encounter (HCC) [S12.9XXA] Alcoholic intoxication without complication (HCC) [F10.920] Other closed nondisplaced fracture of seventh cervical vertebra, initial encounter (HCC) [S12.691A] DISCHARGE DIAGNOSIS:  Active Problems:   Syncope   Pressure injury of skin  SECONDARY DIAGNOSIS:   Past Medical History:  Diagnosis Date  . Asthma   . COPD (chronic obstructive pulmonary disease) (HCC)   . Hypertension    HOSPITAL COURSE:   61 year old male admitted for syncope. 1.  Syncope: Multifactorial including orthostasis, alcohol intoxication and poor cardiac output.  recent echocardiogram: 30-35%.    Follow-up cardiologist as outpatient.. 2.  Cervical spine fracture: [place] collar in place.It is OK to remove collar at this time. No follo wup needed per Dr. Adriana Simas, neurosurgeon..  3.  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  Patient has received normal saline in the emergency department.  Encourage p.o. Intake.  4.  Alcohol abuse: Currently the patient is mildly intoxicated.    No signs of withdrawal.  5.  CHF: BNP only mildly elevated. Stable. Discussed with Dr. Adriana Simas. DISCHARGE CONDITIONS:  Stable, discharge to home today. CONSULTS OBTAINED:  Treatment Team:  Katy Apo, MD Lyndle Herrlich, MD DRUG ALLERGIES:   Allergies  Allergen Reactions  . Codeine Other (See Comments)    Nasal drip   DISCHARGE MEDICATIONS:   Allergies as of 04/02/2017      Reactions   Codeine Other (See Comments)   Nasal drip      Medication List    TAKE these medications   albuterol 108 (90 Base) MCG/ACT inhaler Commonly known as:  PROVENTIL HFA;VENTOLIN HFA Inhale 2 puffs into the lungs 2 (two) times daily.   aspirin 81 MG chewable tablet Chew 1 tablet (81 mg total) by mouth daily.   buPROPion 150 MG 24 hr tablet Commonly known as:  WELLBUTRIN XL Take 150 mg by mouth daily.   carvedilol 3.125 MG tablet Commonly known as:  COREG Take 1 tablet (3.125 mg total) by mouth 2 (two) times daily with a meal.   donepezil 5 MG tablet Commonly known as:  ARICEPT Take 5 mg by mouth at bedtime.   feeding supplement (ENSURE ENLIVE) Liqd Take 237 mLs by mouth 2 (two) times daily between meals.   Fluticasone-Salmeterol 500-50 MCG/DOSE Aepb Commonly known as:  ADVAIR Inhale 1 puff into the lungs 2 (two) times daily.   lisinopril 20 MG tablet Commonly known as:  PRINIVIL,ZESTRIL Take 10 mg by mouth daily.   meloxicam 7.5 MG tablet Commonly known as:  MOBIC Take 7.5 mg by mouth daily.   pantoprazole 40 MG tablet Commonly known as:  PROTONIX Take 1 tablet (40 mg total) by mouth daily.   potassium chloride SA 20 MEQ tablet Commonly known as:  K-DUR,KLOR-CON Take 40 mEq by mouth daily.   rosuvastatin 10 MG tablet Commonly known as:  CRESTOR Take 1 tablet (10 mg total) by mouth daily.   STIOLTO RESPIMAT 2.5-2.5 MCG/ACT Aers Generic drug:  Tiotropium Bromide-Olodaterol Inhale 2 puffs into the lungs daily.   traZODone 50 MG tablet Commonly known as:  DESYREL Take  50 mg by mouth at bedtime.        DISCHARGE INSTRUCTIONS:  See AVS.  If you experience worsening of your admission symptoms, develop shortness of breath, life threatening emergency, suicidal or homicidal thoughts you must seek medical attention immediately by calling 911 or calling your MD immediately  if symptoms less severe.  You Must read complete instructions/literature along with all the possible adverse reactions/side effects for  all the Medicines you take and that have been prescribed to you. Take any new Medicines after you have completely understood and accpet all the possible adverse reactions/side effects.   Please note  You were cared for by a hospitalist during your hospital stay. If you have any questions about your discharge medications or the care you received while you were in the hospital after you are discharged, you can call the unit and asked to speak with the hospitalist on call if the hospitalist that took care of you is not available. Once you are discharged, your primary care physician will handle any further medical issues. Please note that NO REFILLS for any discharge medications will be authorized once you are discharged, as it is imperative that you return to your primary care physician (or establish a relationship with a primary care physician if you do not have one) for your aftercare needs so that they can reassess your need for medications and monitor your lab values.    On the day of Discharge:  VITAL SIGNS:  Blood pressure 140/79, pulse 73, temperature 98.3 F (36.8 C), temperature source Oral, resp. rate 18, height 5\' 5"  (1.651 m), weight 98 lb (44.5 kg), SpO2 96 %. PHYSICAL EXAMINATION:  GENERAL:  61 y.o.-year-old patient lying in the bed with no acute distress.  EYES: Pupils equal, round, reactive to light and accommodation. No scleral icterus. Extraocular muscles intact.  HEENT: Head atraumatic, normocephalic. Oropharynx and nasopharynx clear.  NECK:  Supple, no jugular venous distention. No thyroid enlargement, no tenderness.  LUNGS: Normal breath sounds bilaterally, no wheezing, rales,rhonchi or crepitation. No use of accessory muscles of respiration.  CARDIOVASCULAR: S1, S2 normal. No murmurs, rubs, or gallops.  ABDOMEN: Soft, non-tender, non-distended. Bowel sounds present. No organomegaly or mass.  EXTREMITIES: No pedal edema, cyanosis, or clubbing.  NEUROLOGIC: Cranial nerves II  through XII are intact. Muscle strength 5/5 in all extremities. Sensation intact. Gait not checked.  PSYCHIATRIC: The patient is alert and oriented x 3.  SKIN: No obvious rash, lesion, or ulcer.  DATA REVIEW:   CBC Recent Labs  Lab 03/31/17 1946  WBC 4.5  HGB 12.1*  HCT 36.4*  PLT 189    Chemistries  Recent Labs  Lab 03/31/17 1946  NA 134*  K 3.5  CL 103  CO2 22  GLUCOSE 90  BUN <5*  CREATININE 0.66  CALCIUM 7.5*     Microbiology Results  Results for orders placed or performed during the hospital encounter of 10/10/16  Blood culture (routine x 2)     Status: None   Collection Time: 10/10/16  3:07 PM  Result Value Ref Range Status   Specimen Description BLOOD LEFT HAND  Final   Special Requests   Final    BOTTLES DRAWN AEROBIC AND ANAEROBIC Blood Culture adequate volume   Culture NO GROWTH 5 DAYS  Final   Report Status 10/15/2016 FINAL  Final  Blood culture (routine x 2)     Status: Abnormal   Collection Time: 10/10/16  3:07 PM  Result Value Ref Range Status  Specimen Description BLOOD LEFT ASSIST CONTROL  Final   Special Requests   Final    BOTTLES DRAWN AEROBIC AND ANAEROBIC Blood Culture adequate volume   Culture  Setup Time   Final    GRAM POSITIVE COCCI AEROBIC BOTTLE ONLY CRITICAL RESULT CALLED TO, READ BACK BY AND VERIFIED WITH: LISA KLUTTZ AT 1804 10/11/16.PMH    Culture (A)  Final    STAPHYLOCOCCUS SPECIES (COAGULASE NEGATIVE) THE SIGNIFICANCE OF ISOLATING THIS ORGANISM FROM A SINGLE SET OF BLOOD CULTURES WHEN MULTIPLE SETS ARE DRAWN IS UNCERTAIN. PLEASE NOTIFY THE MICROBIOLOGY DEPARTMENT WITHIN ONE WEEK IF SPECIATION AND SENSITIVITIES ARE REQUIRED. Performed at [facility], [address] [place], [place] [address]    Report Status 10/13/2016 FINAL  Final  Blood Culture ID Panel (Reflexed)     Status: Abnormal   Collection Time: 10/10/16  3:07 PM  Result Value Ref Range Status   Enterococcus species NOT DETECTED NOT DETECTED Final    Listeria monocytogenes NOT DETECTED NOT DETECTED Final   Staphylococcus species DETECTED (A) NOT DETECTED Final    Comment: Methicillin (oxacillin) susceptible coagulase negative staphylococcus. Possible blood culture contaminant (unless isolated from more than one blood culture draw or clinical case suggests pathogenicity). No antibiotic treatment is indicated for blood  culture contaminants. CRITICAL RESULT CALLED TO, READ BACK BY AND VERIFIED WITH: LISA KLUTTZ AT 1804 10/11/16.PMH    Staphylococcus aureus NOT DETECTED NOT DETECTED Final   Methicillin resistance NOT DETECTED NOT DETECTED Final   Streptococcus species NOT DETECTED NOT DETECTED Final   Streptococcus agalactiae NOT DETECTED NOT DETECTED Final   Streptococcus pneumoniae NOT DETECTED NOT DETECTED Final   Streptococcus pyogenes NOT DETECTED NOT DETECTED Final   Acinetobacter baumannii NOT DETECTED NOT DETECTED Final   Enterobacteriaceae species NOT DETECTED NOT DETECTED Final   Enterobacter cloacae complex NOT DETECTED NOT DETECTED Final   Escherichia coli NOT DETECTED NOT DETECTED Final   Klebsiella oxytoca NOT DETECTED NOT DETECTED Final   Klebsiella pneumoniae NOT DETECTED NOT DETECTED Final   Proteus species NOT DETECTED NOT DETECTED Final   Serratia marcescens NOT DETECTED NOT DETECTED Final   Haemophilus influenzae NOT DETECTED NOT DETECTED Final   Neisseria meningitidis NOT DETECTED NOT DETECTED Final   Pseudomonas aeruginosa NOT DETECTED NOT DETECTED Final   Candida albicans NOT DETECTED NOT DETECTED Final   Candida glabrata NOT DETECTED NOT DETECTED Final   Candida krusei NOT DETECTED NOT DETECTED Final   Candida parapsilosis NOT DETECTED NOT DETECTED Final   Candida tropicalis NOT DETECTED NOT DETECTED Final    RADIOLOGY:  Dg Cervical Spine With Flex & Extend  Result Date: 04/02/2017 CLINICAL DATA:  60 year old male status post fall. Fracture at the tip of the C7 spinous process. EXAM: CERVICAL SPINE COMPLETE  WITH FLEXION AND EXTENSION VIEWS COMPARISON:  CT cervical spine 03/31/2017. FINDINGS: Bulky calcified carotid atherosclerosis re - demonstrated in the neck. Normal prevertebral soft tissue contour. Straightening of cervical lordosis persists. Stable cervicothoracic junction alignment. Cervical vertebral height and alignment appears stable on the neutral lateral view. The mildly displaced tip of C7 spinous process fractures re- demonstrated. Lateral views were obtained in flexion and extension and demonstrate little range of motion, but no abnormal motion. Maintained posterior element alignment bilaterally. AP alignment remains normal. Negative lung apices. C1-C2 alignment and joint space remains normal. Negative odontoid. IMPRESSION: 1. Persistent straightening of cervical lordosis. Little range of motion demonstrated with flexion/extension, but no abnormal motion identified to suggest instability. 2. Stable mildly displaced fracture  at the tip of the C7 spinous process. 3. Bulky cervical carotid calcified atherosclerosis. Electronically Signed   By: Odessa Fleming M.D.   On: 04/02/2017 09:12     Management plans discussed with the patient, family and they are in agreement.  CODE STATUS: Full Code   TOTAL TIME TAKING CARE OF THIS PATIENT: 33 minutes.    Shaune Pollack M.D on 04/02/2017 at 12:38 PM  Between 7am to 6pm - Pager - [phone]  After 6pm go to [email or web address] - Social research officer, government  [facility] [place] [facility]  Office  [phone]  CC: Primary care physician; Evelene Croon, MD   Note: This dictation was prepared with Dragon dictation along with smaller phrase technology. Any transcriptional errors that result from this process are unintentional.

## 2017-04-02 NOTE — Progress Notes (Signed)
Patient had cervical spine xrays in upright position. No abnormal motion and stable spinous process fracture. This is non-structural and patient has no neck pain. It is OK to remove collar at this time. No follo wup needed. If patient develops worsened neck pain or extremity symptoms, please re-consult

## 2017-04-02 NOTE — Progress Notes (Signed)
Caleb Carpenter to be D/C'd Home per MD order.  Discussed  follow up appointments with the patient. medication list explained in detail. Pt verbalized understanding.  Allergies as of 04/02/2017      Reactions   Codeine Other (See Comments)   Nasal drip      Medication List    TAKE these medications   albuterol 108 (90 Base) MCG/ACT inhaler Commonly known as:  PROVENTIL HFA;VENTOLIN HFA Inhale 2 puffs into the lungs 2 (two) times daily.   aspirin 81 MG chewable tablet Chew 1 tablet (81 mg total) by mouth daily.   buPROPion 150 MG 24 hr tablet Commonly known as:  WELLBUTRIN XL Take 150 mg by mouth daily.   carvedilol 3.125 MG tablet Commonly known as:  COREG Take 1 tablet (3.125 mg total) by mouth 2 (two) times daily with a meal.   donepezil 5 MG tablet Commonly known as:  ARICEPT Take 5 mg by mouth at bedtime.   feeding supplement (ENSURE ENLIVE) Liqd Take 237 mLs by mouth 2 (two) times daily between meals.   Fluticasone-Salmeterol 500-50 MCG/DOSE Aepb Commonly known as:  ADVAIR Inhale 1 puff into the lungs 2 (two) times daily.   lisinopril 20 MG tablet Commonly known as:  PRINIVIL,ZESTRIL Take 10 mg by mouth daily.   meloxicam 7.5 MG tablet Commonly known as:  MOBIC Take 7.5 mg by mouth daily.   pantoprazole 40 MG tablet Commonly known as:  PROTONIX Take 1 tablet (40 mg total) by mouth daily.   potassium chloride SA 20 MEQ tablet Commonly known as:  K-DUR,KLOR-CON Take 40 mEq by mouth daily.   rosuvastatin 10 MG tablet Commonly known as:  CRESTOR Take 1 tablet (10 mg total) by mouth daily.   STIOLTO RESPIMAT 2.5-2.5 MCG/ACT Aers Generic drug:  Tiotropium Bromide-Olodaterol Inhale 2 puffs into the lungs daily.   traZODone 50 MG tablet Commonly known as:  DESYREL Take 50 mg by mouth at bedtime.       Vitals:   04/02/17 0430 04/02/17 0733  BP: 139/87 140/79  Pulse: 82 73  Resp: 19 18  Temp: 98.5 F (36.9 C) 98.3 F (36.8 C)  SpO2: 98% 96%     Skin clean, dry and intact without evidence of skin break down, no evidence of skin tears noted. IV catheter discontinued intact. Site without signs and symptoms of complications. Dressing and pressure applied. Pt denies pain at this time. No complaints noted.  An After Visit Summary was printed and given to the patient. Patient escorted via WC, and D/C home via private auto.  Caleb Carpenter Caleb Carpenter

## 2017-04-02 NOTE — Progress Notes (Signed)
C-Collar placed back on pt after pt removing 4 times overnight. Nurse went to room for 0600 med and c-collar was off again. Pt is refusing to wear c-collar.

## 2017-04-02 NOTE — Discharge Instructions (Signed)
Avoid alcohol.

## 2017-04-10 ENCOUNTER — Ambulatory Visit: Payer: Medicaid Other | Attending: Family | Admitting: Family

## 2017-04-10 ENCOUNTER — Encounter: Payer: Self-pay | Admitting: Family

## 2017-04-10 VITALS — BP 145/84 | HR 104 | Resp 18 | Ht 65.0 in | Wt 94.4 lb

## 2017-04-10 DIAGNOSIS — Z7289 Other problems related to lifestyle: Secondary | ICD-10-CM | POA: Insufficient documentation

## 2017-04-10 DIAGNOSIS — Z87891 Personal history of nicotine dependence: Secondary | ICD-10-CM | POA: Insufficient documentation

## 2017-04-10 DIAGNOSIS — I110 Hypertensive heart disease with heart failure: Secondary | ICD-10-CM | POA: Insufficient documentation

## 2017-04-10 DIAGNOSIS — Z79899 Other long term (current) drug therapy: Secondary | ICD-10-CM | POA: Insufficient documentation

## 2017-04-10 DIAGNOSIS — I5022 Chronic systolic (congestive) heart failure: Secondary | ICD-10-CM | POA: Diagnosis present

## 2017-04-10 DIAGNOSIS — F419 Anxiety disorder, unspecified: Secondary | ICD-10-CM | POA: Diagnosis not present

## 2017-04-10 DIAGNOSIS — I10 Essential (primary) hypertension: Secondary | ICD-10-CM

## 2017-04-10 DIAGNOSIS — W19XXXS Unspecified fall, sequela: Secondary | ICD-10-CM

## 2017-04-10 DIAGNOSIS — Z7982 Long term (current) use of aspirin: Secondary | ICD-10-CM | POA: Insufficient documentation

## 2017-04-10 DIAGNOSIS — J449 Chronic obstructive pulmonary disease, unspecified: Secondary | ICD-10-CM | POA: Insufficient documentation

## 2017-04-10 DIAGNOSIS — F0390 Unspecified dementia without behavioral disturbance: Secondary | ICD-10-CM | POA: Diagnosis not present

## 2017-04-10 DIAGNOSIS — E871 Hypo-osmolality and hyponatremia: Secondary | ICD-10-CM | POA: Diagnosis not present

## 2017-04-10 DIAGNOSIS — E876 Hypokalemia: Secondary | ICD-10-CM | POA: Diagnosis not present

## 2017-04-10 DIAGNOSIS — G309 Alzheimer's disease, unspecified: Secondary | ICD-10-CM

## 2017-04-10 DIAGNOSIS — F17220 Nicotine dependence, chewing tobacco, uncomplicated: Secondary | ICD-10-CM | POA: Diagnosis not present

## 2017-04-10 DIAGNOSIS — F0280 Dementia in other diseases classified elsewhere without behavioral disturbance: Secondary | ICD-10-CM

## 2017-04-10 NOTE — Patient Instructions (Signed)
Take CARVEDILOL 2 times daily

## 2017-04-10 NOTE — Progress Notes (Signed)
Patient ID: Caleb Carpenter, male    DOB: 1955-08-05, 61 y.o.   MRN: [id number]  HPI  Mr Rozelle is a 61 y/o male with a history of HTN, COPD, asthma, hypokalemia, hyponatremia, current chewing tobacco use, alcohol use and chronic heart failure.   Reviewed last echo report done on 10/11/16 which showed an EF of 30-35% along with mild MR, moderate TR and severely elevated PA pressure of 70 mm Hg.   Admitted 03/31/17 due to syncope due to orthostasis, alcohol intoxication and poor cardiac output. Had cervical spine fracture and neurosurgery was consulted. Discharged after 2 days. Was in the ED 01/30/17 due to weakness. Evaluated and released. Admitted 10/10/16 due to syncope/fall. Treated with antibiotics for pneumonia. Speech consult obtained due to difficulty swallowing. Discharged after 3 days. Was in the ED 09/29/16 due to COPD exacerbation. Treated and released.   He presents today for his follow-up visit with a chief complaint of minimal shortness of breath upon moderate exertion. He describes this as chronic in nature having been present for several years with varying levels of severity. He has associated fatigue, cough, light-headedness, difficulty sleeping, anxiety and easy bruising along with this. He denies any chest pain, edema, palpitations or weight gain. He does admit that he doesn't have enough food to generally eat.   Past Medical History:  Diagnosis Date  . Asthma   . COPD (chronic obstructive pulmonary disease) (HCC)   . Hypertension    Past Surgical History:  Procedure Laterality Date  . CATARACT EXTRACTION, BILATERAL Bilateral   . ESOPHAGOGASTRODUODENOSCOPY (EGD) WITH PROPOFOL N/A 06/21/2015   Procedure: ESOPHAGOGASTRODUODENOSCOPY (EGD) WITH PROPOFOL;  Surgeon: Midge Minium, MD;  Location: [facility] ENDOSCOPY;  Service: Endoscopy;  Laterality: N/A;   Family History  Problem Relation Age of Onset  . Diabetes Unknown   . Hypertension Unknown    Social History   Tobacco Use  . Smoking  status: Former Smoker    Types: Cigarettes    Last attempt to quit: 06/21/2014    Years since quitting: 2.8  . Smokeless tobacco: Current User    Types: Chew  Substance Use Topics  . Alcohol use: Yes    Alcohol/week: 1.2 oz    Types: 2 Cans of beer per week    Comment: per day   Allergies  Allergen Reactions  . Codeine Other (See Comments)    Nasal drip   Prior to Admission medications   Medication Sig Start Date End Date Taking? Authorizing Provider  albuterol (PROVENTIL HFA;VENTOLIN HFA) 108 (90 Base) MCG/ACT inhaler Inhale 2 puffs into the lungs 2 (two) times daily.   Yes [provider]  aspirin 81 MG chewable tablet Chew 1 tablet (81 mg total) by mouth daily. 10/13/16  Yes Mody, Patricia Pesa, MD  buPROPion (WELLBUTRIN XL) 150 MG 24 hr tablet Take 150 mg by mouth daily.   Yes [provider]  carvedilol (COREG) 3.125 MG tablet Take 1 tablet (3.125 mg total) by mouth 2 (two) times daily with a meal. 11/01/16  Yes Hackney, Tina A, FNP  donepezil (ARICEPT) 5 MG tablet Take 5 mg by mouth at bedtime.   Yes [provider]  feeding supplement, ENSURE ENLIVE, (ENSURE ENLIVE) LIQD Take 237 mLs by mouth 2 (two) times daily between meals. 08/13/16  Yes Mody, Patricia Pesa, MD  Fluticasone-Salmeterol (ADVAIR) 500-50 MCG/DOSE AEPB Inhale 1 puff into the lungs 2 (two) times daily.   Yes [provider]  lisinopril (PRINIVIL,ZESTRIL) 20 MG tablet Take 10 mg by mouth  daily.   Yes [provider]  meloxicam (MOBIC) 7.5 MG tablet Take 7.5 mg by mouth daily.   Yes [provider]  pantoprazole (PROTONIX) 40 MG tablet Take 1 tablet (40 mg total) by mouth daily. 10/13/16  Yes Mody, Patricia PesaSital, MD  potassium chloride SA (K-DUR,KLOR-CON) 20 MEQ tablet Take 40 mEq by mouth daily.    Yes [provider]  rosuvastatin (CRESTOR) 10 MG tablet Take 1 tablet (10 mg total) by mouth daily. 11/01/16  Yes Hackney, Tina A, FNP  Tiotropium Bromide-Olodaterol (STIOLTO RESPIMAT)  2.5-2.5 MCG/ACT AERS Inhale 2 puffs into the lungs daily.   Yes [provider]  traZODone (DESYREL) 50 MG tablet Take 50 mg by mouth at bedtime.   Yes [provider]    Review of Systems  Constitutional: Positive for fatigue. Negative for appetite change.  HENT: Negative for congestion, rhinorrhea and sore throat.   Eyes: Negative.   Respiratory: Positive for cough (at times) and shortness of breath. Negative for chest tightness.   Cardiovascular: Negative for chest pain, palpitations and leg swelling.  Gastrointestinal: Negative for abdominal distention and abdominal pain.  Endocrine: Negative.   Genitourinary: Negative.   Musculoskeletal: Positive for arthralgias (left knee/left hip). Negative for back pain and neck pain.  Skin: Negative.   Allergic/Immunologic: Negative.   Neurological: Positive for light-headedness. Negative for dizziness.       Memory loss  Hematological: Negative for adenopathy. Bruises/bleeds easily.  Psychiatric/Behavioral: Positive for sleep disturbance (trouble falling asleep; sleeping on 1 pillow). Negative for dysphoric mood. The patient is nervous/anxious.    Vitals:   04/10/17 1621  BP: (!) 145/84  Pulse: (!) 104  Resp: 18  SpO2: 94%  Weight: 94 lb 6 oz (42.8 kg)  Height: 5\' 5"  (1.651 m)   Wt Readings from Last 3 Encounters:  04/10/17 94 lb 6 oz (42.8 kg)  04/02/17 98 lb (44.5 kg)  02/17/17 96 lb 4 oz (43.7 kg)    Lab Results  Component Value Date   CREATININE 0.66 03/31/2017   CREATININE 0.74 01/30/2017   CREATININE 0.76 11/06/2016   Physical Exam  Constitutional: He is oriented to person, place, and time. He appears well-developed.  thin  HENT:  Head: Normocephalic and atraumatic.  Neck: Normal range of motion. Neck supple. No JVD present.  Cardiovascular: Regular rhythm. Tachycardia present.  Pulmonary/Chest: Effort normal. He has no wheezes. He has no rhonchi. He has no rales.  Rhonchi clears with cough   Abdominal: Soft. He exhibits no distension. There is no tenderness.  Musculoskeletal: He exhibits no edema.       Left knee: He exhibits no swelling and no ecchymosis.  Neurological: He is alert and oriented to person, place, and time.  Skin: Skin is warm and dry.  Psychiatric: He has a normal mood and affect. His behavior is normal. Thought content normal.  Nursing note and vitals reviewed.  Assessment & Plan:  1: Chronic heart failure with reduced ejection fraction- - NYHA class II - euvolemic - weighing daily and he's lost some more weight. Reminded to call for an overnight weight gain of >2 pounds or a weekly weight gain of >5 pounds.  - not adding salt and is trying to follow a low sodium diet although he sometimes eats food higher in sodium due to finances - has only eaten tuna fish all day today - does not meet REDS vest criteria due to low BMI - continues to live with a friend and says that he  doesn't like the food that his friend buys so he tries to buy his own but many times runs out of money - resources given for local food banks - [place] home health referral made as I'm concerned about patient's ability to take his medications correctly. He says that he takes all his medicine at one time instead of taking the carvedilol twice daily. May need PT eval as well due to frequent falls. Social worker can also assist with situation - patient reports already receiving his flu vaccine for this season  2: HTN- - BP looks good today although he hasn't taken any of his meds yet as he takes them all at bedtime - sees PCP Gordy Councilman)  - BMP from 03/31/17 reviewed and shows sodium 134, potassium 3.5 and GFR >60  3: Dementia- - does have memory loss  - patient says that he's currently managing his medications himself by looking at the colors of the tablets due to not being able to read although he does say that he takes everything just at bedtime - [facility] referral per above - living with a  friend and relying on them for transportation  4: Fall- - recently fell and had fracture in neck - does drink ~24 ounces of beer daily; encouraged him to not drink anything as he's had multiple falls possibly related to alcohol  Patient did not bring his medications nor a list. Each medication was verbally reviewed with the patient and he was encouraged to bring the bottles to every visit to confirm accuracy of list.  Return here in 2 weeks or sooner for any questions/problems before then.

## 2017-04-11 ENCOUNTER — Encounter: Payer: Self-pay | Admitting: Family

## 2017-04-14 ENCOUNTER — Other Ambulatory Visit: Payer: Self-pay | Admitting: Family Medicine

## 2017-04-20 NOTE — Progress Notes (Deleted)
Patient ID: Caleb Carpenter, male    DOB: 1955/05/23, 62 y.o.   MRN: [id number]  HPI  Caleb Carpenter is a 62 y/o male with a history of HTN, COPD, asthma, hypokalemia, hyponatremia, current chewing tobacco use, alcohol use and chronic heart failure.   Reviewed last echo report done on 10/11/16 which showed an EF of 30-35% along with mild Caleb, moderate TR and severely elevated PA pressure of 70 mm Hg.   Admitted 03/31/17 due to syncope due to orthostasis, alcohol intoxication and poor cardiac output. Had cervical spine fracture and neurosurgery was consulted. Discharged after 2 days. Was in the ED 01/30/17 due to weakness. Evaluated and released. Admitted 10/10/16 due to syncope/fall. Treated with antibiotics for pneumonia. Speech consult obtained due to difficulty swallowing. Discharged after 3 days. Was in the ED 09/29/16 due to COPD exacerbation. Treated and released.   He presents today for his follow-up visit with a chief complaint of    Past Medical History:  Diagnosis Date  . Asthma   . COPD (chronic obstructive pulmonary disease) (HCC)   . Hypertension    Past Surgical History:  Procedure Laterality Date  . CATARACT EXTRACTION, BILATERAL Bilateral   . ESOPHAGOGASTRODUODENOSCOPY (EGD) WITH PROPOFOL N/A 06/21/2015   Procedure: ESOPHAGOGASTRODUODENOSCOPY (EGD) WITH PROPOFOL;  Surgeon: Midge Minium, MD;  Location: [facility] ENDOSCOPY;  Service: Endoscopy;  Laterality: N/A;   Family History  Problem Relation Age of Onset  . Diabetes Unknown   . Hypertension Unknown    Social History   Tobacco Use  . Smoking status: Former Smoker    Types: Cigarettes    Last attempt to quit: 06/21/2014    Years since quitting: 2.8  . Smokeless tobacco: Current User    Types: Chew  Substance Use Topics  . Alcohol use: Yes    Alcohol/week: 1.2 oz    Types: 2 Cans of beer per week    Comment: per day   Allergies  Allergen Reactions  . Codeine Other (See Comments)    Nasal drip     Review of Systems   Constitutional: Positive for fatigue. Negative for appetite change.  HENT: Negative for congestion, rhinorrhea and sore throat.   Eyes: Negative.   Respiratory: Positive for cough (at times) and shortness of breath. Negative for chest tightness.   Cardiovascular: Negative for chest pain, palpitations and leg swelling.  Gastrointestinal: Negative for abdominal distention and abdominal pain.  Endocrine: Negative.   Genitourinary: Negative.   Musculoskeletal: Positive for arthralgias (left knee/left hip). Negative for back pain and neck pain.  Skin: Negative.   Allergic/Immunologic: Negative.   Neurological: Positive for light-headedness. Negative for dizziness.       Memory loss  Hematological: Negative for adenopathy. Bruises/bleeds easily.  Psychiatric/Behavioral: Positive for sleep disturbance (trouble falling asleep; sleeping on 1 pillow). Negative for dysphoric mood. The patient is nervous/anxious.      Lab Results  Component Value Date   CREATININE 0.66 03/31/2017   CREATININE 0.74 01/30/2017   CREATININE 0.76 11/06/2016   Physical Exam  Constitutional: He is oriented to person, place, and time. He appears well-developed.  thin  HENT:  Head: Normocephalic and atraumatic.  Neck: Normal range of motion. Neck supple. No JVD present.  Cardiovascular: Regular rhythm. Tachycardia present.  Pulmonary/Chest: Effort normal. He has no wheezes. He has no rhonchi. He has no rales.  Rhonchi clears with cough  Abdominal: Soft. He exhibits no distension. There is no tenderness.  Musculoskeletal: He exhibits no edema.  Left knee: He exhibits no swelling and no ecchymosis.  Neurological: He is alert and oriented to person, place, and time.  Skin: Skin is warm and dry.  Psychiatric: He has a normal mood and affect. His behavior is normal. Thought content normal.  Nursing note and vitals reviewed.  Assessment & Plan:  1: Chronic heart failure with reduced ejection fraction- - NYHA  class II - euvolemic - weighing daily and he's lost some more weight. Reminded to call for an overnight weight gain of >2 pounds or a weekly weight gain of >5 pounds.  - not adding salt and is trying to follow a low sodium diet although he sometimes eats food higher in sodium due to finances - has only eaten tuna fish all day today - does not meet REDS vest criteria due to low BMI - continues to live with a friend and says that he doesn't like the food that his friend buys so he tries to buy his own but many times runs out of money - resources given for local food banks - [facility] home [facility] referral made as I'm concerned about patient's ability to take his medications correctly. He says that he takes all his medicine at one time instead of taking the carvedilol twice daily. May need PT eval as well due to frequent falls. Social worker can also assist with situation - patient reports already receiving his flu vaccine for this season  2: HTN- - BP looks good today although he hasn't taken any of his meds yet as he takes them all at bedtime - sees PCP Gordy Councilman)  - BMP from 03/31/17 reviewed and shows sodium 134, potassium 3.5 and GFR >60  3: Dementia- - does have memory loss  - patient says that he's currently managing his medications himself by looking at the colors of the tablets due to not being able to read although he does say that he takes everything just at bedtime - [facility] referral per above - living with a friend and relying on them for transportation  4: Fall- - recently fell and had fracture in neck - does drink ~24 ounces of beer daily; encouraged him to not drink anything as he's had multiple falls possibly related to alcohol  Patient did not bring his medications nor a list. Each medication was verbally reviewed with the patient and he was encouraged to bring the bottles to every visit to confirm accuracy of list.

## 2017-04-21 ENCOUNTER — Ambulatory Visit: Payer: Medicaid Other | Admitting: Family

## 2017-04-22 ENCOUNTER — Telehealth: Payer: Self-pay | Admitting: Family

## 2017-04-22 NOTE — Telephone Encounter (Signed)
Patient did not show for his [facility] appointment on 04/21/17. Will attempt to reschedule.

## 2017-04-30 ENCOUNTER — Encounter: Payer: Self-pay | Admitting: *Deleted

## 2017-04-30 ENCOUNTER — Emergency Department
Admission: EM | Admit: 2017-04-30 | Discharge: 2017-05-01 | Disposition: A | Discharge status: Home / Self Care | Payer: Medicaid Other | Attending: Student in an Organized Health Care Education/Training Program | Admitting: Student in an Organized Health Care Education/Training Program

## 2017-04-30 ENCOUNTER — Emergency Department: Payer: Medicaid Other

## 2017-04-30 ENCOUNTER — Other Ambulatory Visit: Payer: Self-pay

## 2017-04-30 DIAGNOSIS — R0789 Other chest pain: Secondary | ICD-10-CM | POA: Diagnosis present

## 2017-04-30 DIAGNOSIS — I5022 Chronic systolic (congestive) heart failure: Secondary | ICD-10-CM | POA: Insufficient documentation

## 2017-04-30 DIAGNOSIS — J441 Chronic obstructive pulmonary disease with (acute) exacerbation: Secondary | ICD-10-CM

## 2017-04-30 DIAGNOSIS — I110 Hypertensive heart disease with heart failure: Secondary | ICD-10-CM | POA: Diagnosis not present

## 2017-04-30 DIAGNOSIS — R0602 Shortness of breath: Secondary | ICD-10-CM | POA: Diagnosis not present

## 2017-04-30 DIAGNOSIS — Z7982 Long term (current) use of aspirin: Secondary | ICD-10-CM | POA: Insufficient documentation

## 2017-04-30 DIAGNOSIS — Z87891 Personal history of nicotine dependence: Secondary | ICD-10-CM | POA: Insufficient documentation

## 2017-04-30 DIAGNOSIS — J45909 Unspecified asthma, uncomplicated: Secondary | ICD-10-CM | POA: Insufficient documentation

## 2017-04-30 DIAGNOSIS — I2510 Atherosclerotic heart disease of native coronary artery without angina pectoris: Secondary | ICD-10-CM | POA: Diagnosis not present

## 2017-04-30 DIAGNOSIS — J40 Bronchitis, not specified as acute or chronic: Secondary | ICD-10-CM | POA: Insufficient documentation

## 2017-04-30 DIAGNOSIS — M549 Dorsalgia, unspecified: Secondary | ICD-10-CM | POA: Insufficient documentation

## 2017-04-30 DIAGNOSIS — R911 Solitary pulmonary nodule: Secondary | ICD-10-CM | POA: Diagnosis not present

## 2017-04-30 DIAGNOSIS — R079 Chest pain, unspecified: Secondary | ICD-10-CM

## 2017-04-30 LAB — COMPREHENSIVE METABOLIC PANEL
ALBUMIN: 3.7 g/dL (ref 3.5–5.0)
ALK PHOS: 86 U/L (ref 38–126)
ALT: 15 U/L — ABNORMAL LOW (ref 17–63)
AST: 38 U/L (ref 15–41)
Anion gap: 18 — ABNORMAL HIGH (ref 5–15)
BILIRUBIN TOTAL: 1 mg/dL (ref 0.3–1.2)
BUN: 5 mg/dL — ABNORMAL LOW (ref 6–20)
CO2: 23 mmol/L (ref 22–32)
CREATININE: 0.72 mg/dL (ref 0.61–1.24)
Calcium: 8.8 mg/dL — ABNORMAL LOW (ref 8.9–10.3)
Chloride: 94 mmol/L — ABNORMAL LOW (ref 101–111)
GFR calc Af Amer: >60 mL/min (ref >60)
GLUCOSE: 124 mg/dL — AB (ref 65–99)
POTASSIUM: 3.8 mmol/L (ref 3.5–5.1)
Sodium: 135 mmol/L (ref 135–145)
TOTAL PROTEIN: 7.3 g/dL (ref 6.5–8.1)

## 2017-04-30 LAB — CBC
HEMATOCRIT: 38 % — AB (ref 40.0–52.0)
HEMOGLOBIN: 12.6 g/dL — AB (ref 13.0–18.0)
MCH: 33.1 pg (ref 26.0–34.0)
MCHC: 33.1 g/dL (ref 32.0–36.0)
MCV: 100.2 fL — AB (ref 80.0–100.0)
PLATELETS: 179 10*3/uL (ref 150–440)
RBC: 3.8 MIL/uL — AB (ref 4.40–5.90)
RDW: 14.7 % — ABNORMAL HIGH (ref 11.5–14.5)
WBC: 11.4 10*3/uL — AB (ref 3.8–10.6)

## 2017-04-30 LAB — FIBRIN DERIVATIVES D-DIMER (ARMC ONLY): FIBRIN DERIVATIVES D-DIMER (ARMC): 3042.07 ng{FEU}/mL — AB (ref 0.00–499.00)

## 2017-04-30 LAB — LACTIC ACID, PLASMA: LACTIC ACID, VENOUS: 1.3 mmol/L (ref 0.5–1.9)

## 2017-04-30 LAB — TROPONIN I: Troponin I: 0.03 ng/mL (ref <0.03)

## 2017-04-30 MED ORDER — IOPAMIDOL (ISOVUE-370) INJECTION 76%
75.0000 mL | Freq: Once | INTRAVENOUS | Status: AC | PRN
  Administered 2017-04-30: 75 mL via INTRAVENOUS

## 2017-04-30 MED ORDER — ONDANSETRON HCL 4 MG/2ML IJ SOLN
4.0000 mg | Freq: Once | INTRAMUSCULAR | Status: AC
  Administered 2017-04-30: 4 mg via INTRAVENOUS

## 2017-04-30 MED ORDER — IPRATROPIUM-ALBUTEROL 0.5-2.5 (3) MG/3ML IN SOLN
3.0000 mL | Freq: Once | RESPIRATORY_TRACT | Status: AC
  Administered 2017-04-30: 3 mL via RESPIRATORY_TRACT

## 2017-04-30 MED ORDER — SODIUM CHLORIDE 0.9 % IV BOLUS (SEPSIS)
500.0000 mL | Freq: Once | INTRAVENOUS | Status: AC
  Administered 2017-04-30: 500 mL via INTRAVENOUS

## 2017-04-30 NOTE — ED Triage Notes (Signed)
Pt to ED reporting ongoing generalized chest pain for the past three days that radiates into patients back and is worse when laying flat. Pt also reports SOB and abd pain. 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 Pt also has hx of COPD and a cough that pt reports, "I always have." Pt in NAd at time of arrival. No increased WOB noted.

## 2017-04-30 NOTE — ED Provider Notes (Signed)
[facility] Emergency Department Provider Note    First MD Initiated Contact with Patient 04/30/17 2036     (approximate)  I have reviewed the triage vital signs and the nursing notes.   HISTORY  Chief Complaint Chest Pain and Shortness of Breath    HPI Caleb Carpenter is a 62 y.o. male with a history of COPD elevated blood pressure and self-reported history of CAD presents to the ER for generalized anterior chest wall pain and back pain for the past 3 days that is worse when laying flat and when he takes a deep breath.  Denies any worsening cough.  Does not describe any worsening shortness of breath compared to his chronic.  Does not wear oxygen at home.  States has been compliant with his medications.  Denies any nausea or vomiting.  No diarrhea.  No history of blood clots.  No lower extremity swelling.  Past Medical History:  Diagnosis Date  . Asthma   . COPD (chronic obstructive pulmonary disease) (HCC)   . Hypertension    Family History  Problem Relation Age of Onset  . Diabetes Unknown   . Hypertension Unknown    Past Surgical History:  Procedure Laterality Date  . CATARACT EXTRACTION, BILATERAL Bilateral   . ESOPHAGOGASTRODUODENOSCOPY (EGD) WITH PROPOFOL N/A 06/21/2015   Procedure: ESOPHAGOGASTRODUODENOSCOPY (EGD) WITH PROPOFOL;  Surgeon: Midge Minium, MD;  Location: [facility] ENDOSCOPY;  Service: Endoscopy;  Laterality: N/A;   Patient Active Problem List   Diagnosis Date Noted  . Syncope 04/01/2017  . Pressure injury of skin 04/01/2017  . Fall 02/18/2017  . Hypokalemia 11/06/2016  . Dementia 11/06/2016  . Chronic systolic heart failure (HCC) 11/01/2016  . Dehydration 08/28/2016  . General weakness 09/03/2015  . Generalized weakness 09/02/2015  . Hyponatremia 09/02/2015  . HTN (hypertension) 09/02/2015  . GERD (gastroesophageal reflux disease) 09/02/2015  . Hypomagnesemia 06/23/2015  . Leukocytosis 06/23/2015  . Alcohol abuse 06/23/2015  .  Thrombocytopenia (HCC) 06/23/2015  . Reflux esophagitis   . Gastritis       Prior to Admission medications   Medication Sig Start Date End Date Taking? Authorizing Provider  albuterol (PROVENTIL HFA;VENTOLIN HFA) 108 (90 Base) MCG/ACT inhaler Inhale 2 puffs into the lungs 2 (two) times daily.    [provider]  aspirin 81 MG chewable tablet Chew 1 tablet (81 mg total) by mouth daily. 10/13/16   Adrian Saran, MD  buPROPion (WELLBUTRIN XL) 150 MG 24 hr tablet Take 150 mg by mouth daily.    [provider]  carvedilol (COREG) 3.125 MG tablet Take 1 tablet (3.125 mg total) by mouth 2 (two) times daily with a meal. 11/01/16   Clarisa Kindred A, FNP  donepezil (ARICEPT) 5 MG tablet Take 5 mg by mouth at bedtime.    [provider]  feeding supplement, ENSURE ENLIVE, (ENSURE ENLIVE) LIQD Take 237 mLs by mouth 2 (two) times daily between meals. 08/13/16   Adrian Saran, MD  Fluticasone-Salmeterol (ADVAIR) 500-50 MCG/DOSE AEPB Inhale 1 puff into the lungs 2 (two) times daily.    [provider]  lisinopril (PRINIVIL,ZESTRIL) 20 MG tablet Take 10 mg by mouth daily.    [provider]  meloxicam (MOBIC) 7.5 MG tablet Take 7.5 mg by mouth daily.    [provider]  pantoprazole (PROTONIX) 40 MG tablet Take 1 tablet (40 mg total) by mouth daily. 10/13/16   Adrian Saran, MD  potassium chloride SA (K-DUR,KLOR-CON) 20 MEQ tablet Take 40 mEq by mouth  daily.     [provider]  rosuvastatin (CRESTOR) 10 MG tablet Take 1 tablet (10 mg total) by mouth daily. 11/01/16   Delma Freeze, FNP  Tiotropium Bromide-Olodaterol (STIOLTO RESPIMAT) 2.5-2.5 MCG/ACT AERS Inhale 2 puffs into the lungs daily.    [provider]  traZODone (DESYREL) 50 MG tablet Take 50 mg by mouth at bedtime.    [provider]    Allergies Codeine    Social History Social History   Tobacco Use  . Smoking status: Former Smoker    Types: Cigarettes    Last attempt  to quit: 06/21/2014    Years since quitting: 2.8  . Smokeless tobacco: Current User    Types: Chew  Substance Use Topics  . Alcohol use: Yes    Alcohol/week: 1.2 oz    Types: 2 Cans of beer per week    Comment: per day  . Drug use: No    Review of Systems Patient denies headaches, rhinorrhea, blurry vision, numbness, shortness of breath, chest pain, edema, cough, abdominal pain, nausea, vomiting, diarrhea, dysuria, fevers, rashes or hallucinations unless otherwise stated above in HPI. ____________________________________________   PHYSICAL EXAM:  VITAL SIGNS: Vitals:   04/30/17 2130 04/30/17 2321  BP: (!) 155/88 (!) 152/79  Pulse: (!) 105 (!) 103  Resp: 18 17  Temp:    SpO2: 96% 96%    Constitutional: Alert and oriented. Chronically ill, disheveled appearing in NAD Eyes: Conjunctivae are normal.  Head: Atraumatic. Nose: No congestion/rhinnorhea. Mouth/Throat: Mucous membranes are moist.   Neck: No stridor. Painless ROM.  Cardiovascular: Normal rate, regular rhythm. Grossly normal heart sounds.  Good peripheral circulation. Respiratory: Normal respiratory effort.  No retractions. Lungs with coarse breathsounds throughout Gastrointestinal: Soft and nontender. No distention. No abdominal bruits. No CVA tenderness. Genitourinary:  Musculoskeletal: No lower extremity tenderness nor edema.  No joint effusions. Neurologic:  Normal speech and language. No gross focal neurologic deficits are appreciated. No facial droop Skin:  Skin is warm, dry and intact. No rash noted. Psychiatric: Mood and affect are normal. Speech and behavior are normal.  ____________________________________________   LABS (all labs ordered are listed, but only abnormal results are displayed)  Results for orders placed or performed during the hospital encounter of 04/30/17 (from the past 24 hour(s))  CBC     Status: Abnormal   Collection Time: 04/30/17  8:50 PM  Result Value Ref Range   WBC 11.4 (H) 3.8  - 10.6 K/uL   RBC 3.80 (L) 4.40 - 5.90 MIL/uL   Hemoglobin 12.6 (L) 13.0 - 18.0 g/dL   HCT 40.9 (L) 81.1 - 91.4 %   MCV 100.2 (H) 80.0 - 100.0 fL   MCH 33.1 26.0 - 34.0 pg   MCHC 33.1 32.0 - 36.0 g/dL   RDW 78.2 (H) 95.6 - 21.3 %   Platelets 179 150 - 440 K/uL  Troponin I     Status: Abnormal   Collection Time: 04/30/17  8:50 PM  Result Value Ref Range   Troponin I 0.03 (HH) <0.03 ng/mL  Comprehensive metabolic panel     Status: Abnormal   Collection Time: 04/30/17  8:50 PM  Result Value Ref Range   Sodium 135 135 - 145 mmol/L   Potassium 3.8 3.5 - 5.1 mmol/L   Chloride 94 (L) 101 - 111 mmol/L   CO2 23 22 - 32 mmol/L   Glucose, Bld 124 (H) 65 - 99 mg/dL   BUN 5 (L) 6 - 20 mg/dL  Creatinine, Ser 0.72 0.61 - 1.24 mg/dL   Calcium 8.8 (L) 8.9 - 10.3 mg/dL   Total Protein 7.3 6.5 - 8.1 g/dL   Albumin 3.7 3.5 - 5.0 g/dL   AST 38 15 - 41 U/L   ALT 15 (L) 17 - 63 U/L   Alkaline Phosphatase 86 38 - 126 U/L   Total Bilirubin 1.0 0.3 - 1.2 mg/dL   GFR calc non Af Amer >60 >60 mL/min   GFR calc Af Amer >60 >60 mL/min   Anion gap 18 (H) 5 - 15  Fibrin derivatives D-Dimer ([facility] only)     Status: Abnormal   Collection Time: 04/30/17  8:50 PM  Result Value Ref Range   Fibrin derivatives D-dimer ([facility]) 3,042.07 (H) 0.00 - 499.00 ng/mL (FEU)  Lactic acid, plasma     Status: None   Collection Time: 04/30/17  9:55 PM  Result Value Ref Range   Lactic Acid, Venous 1.3 0.5 - 1.9 mmol/L   ____________________________________________  EKG My review and personal interpretation at Time: 20:31   Indication: chest pain  Rate: 110  Rhythm: sinus Axis: normal Other: nonspecific st changes, no stemi ____________________________________________  RADIOLOGY  I personally reviewed all radiographic images ordered to evaluate for the above acute complaints and reviewed radiology reports and findings.  These findings were personally discussed with the patient.  Please see medical record for radiology  report.  ____________________________________________   PROCEDURES  Procedure(s) performed:  Procedures    Critical Care performed: no ____________________________________________   INITIAL IMPRESSION / ASSESSMENT AND PLAN / ED COURSE  Pertinent labs & imaging results that were available during my care of the patient were reviewed by me and considered in my medical decision making (see chart for details).  DDX: ACS, pericarditis, esophagitis, boerhaaves, pe, dissection, pna, bronchitis, costochondritis   Caleb Carpenter is a 62 y.o. who presents to the ED with symptoms as described above.  Patient mildly tachycardic and does have wheezing on exam concerning for COPD or bronchitis.  Patient also with pleuritic chest pain therefore will order d-dimer to further risk stratify.  EKG shows nonspecific changes and troponin is essentially normal.  Seems less consistent with ACS but will further risk stratify with repeat troponin.  Patient also with elevated anion gap but no metabolic acidosis will send for lactate which is normal.  Blood work otherwise fairly reassuring.  Patient will be signed out to oncoming physician pending follow-up CT.     ____________________________________________   FINAL CLINICAL IMPRESSION(S) / ED DIAGNOSES  Final diagnoses:  COPD exacerbation (HCC)  Shortness of breath  Chest pain, unspecified type      NEW MEDICATIONS STARTED DURING THIS VISIT:  New Prescriptions   No medications on file     Note:  This document was prepared using Dragon voice recognition software and may include unintentional dictation errors.    Willy Eddy, MD 04/30/17 [phone]

## 2017-04-30 NOTE — ED Notes (Signed)
Lights dimmed and pt lying in room attempting to sleep at this time. NAD and decreased nausea and chest pain both reported to this RN

## 2017-04-30 NOTE — ED Notes (Signed)
Lab informed of add on D-dimer.

## 2017-04-30 NOTE — ED Notes (Signed)
Pt returned to ED Rm 2 from CT at this time. 

## 2017-04-30 NOTE — ED Notes (Signed)
MD at bedside. 

## 2017-04-30 NOTE — ED Notes (Signed)
PT was reported to have become nauseous in Xray and had 1 episode of vomiting. MD made aware.

## 2017-04-30 NOTE — ED Notes (Signed)
Patient transported to X-ray 

## 2017-05-01 LAB — TROPONIN I

## 2017-05-01 MED ORDER — PREDNISONE 50 MG PO TABS: 50.0000 mg | ORAL_TABLET | Freq: Every day | ORAL | 0 refills | Status: AC

## 2017-05-01 MED ORDER — AZITHROMYCIN 250 MG PO TABS: ORAL_TABLET | ORAL | 0 refills | Status: DC

## 2017-05-01 NOTE — ED Provider Notes (Signed)
Care signed over from Dr. Roxan Hockey pending second troponin and results of CT angiogram.  The patient CT scan shows acute bronchitis and fortunately no blood clot.  It does show a number of incidental abnormalities which I discussed with the patient including coronary artery disease, as well as pulmonary nodules.  While his chest pain today is extremely atypical I will refer him to cardiology as an outpatient.  He needs 4 more days of steroids as well as 5 days of antibiotics.  Strict return precautions have been given and the patient verbalizes understanding and agreement with the plan.   Merrily Brittle, MD 05/01/17 [phone]

## 2017-05-01 NOTE — Discharge Instructions (Signed)
Fortunately today your blood work and your CT scan were reassuring.  Please take your antibiotics and your steroids as prescribed and make sure you follow-up with your primary care physician this coming Monday for reevaluation.  The CT scan of your chest did not show any blood clots, although it did show a number of abnormalities that your primary care physician needs to know about.  You have several new nodules on your lung which are nonspecific but do need to be evaluated again in the near future.  It also does look like you have heart disease in her right risk for having a heart attack in the future.  It was a pleasure to take care of you today, and thank you for coming to our emergency department.  If you have any questions or concerns before leaving please ask the nurse to grab me and I'm more than happy to go through your aftercare instructions again.  If you were prescribed any opioid pain medication today such as Norco, Vicodin, Percocet, morphine, hydrocodone, or oxycodone please make sure you do not drive when you are taking this medication as it can alter your ability to drive safely.  If you have any concerns once you are home that you are not improving or are in fact getting worse before you can make it to your follow-up appointment, please do not hesitate to call [phone] and come back for further evaluation.  Merrily Brittle, MD  Results for orders placed or performed during the hospital encounter of 04/30/17  CBC  Result Value Ref Range   WBC 11.4 (H) 3.8 - 10.6 K/uL   RBC 3.80 (L) 4.40 - 5.90 MIL/uL   Hemoglobin 12.6 (L) 13.0 - 18.0 g/dL   HCT 19.1 (L) 47.8 - 29.5 %   MCV 100.2 (H) 80.0 - 100.0 fL   MCH 33.1 26.0 - 34.0 pg   MCHC 33.1 32.0 - 36.0 g/dL   RDW 62.1 (H) 30.8 - 65.7 %   Platelets 179 150 - 440 K/uL  Troponin I  Result Value Ref Range   Troponin I 0.03 (HH) <0.03 ng/mL  Comprehensive metabolic panel  Result Value Ref Range   Sodium 135 135 - 145 mmol/L   Potassium  3.8 3.5 - 5.1 mmol/L   Chloride 94 (L) 101 - 111 mmol/L   CO2 23 22 - 32 mmol/L   Glucose, Bld 124 (H) 65 - 99 mg/dL   BUN 5 (L) 6 - 20 mg/dL   Creatinine, Ser 8.46 0.61 - 1.24 mg/dL   Calcium 8.8 (L) 8.9 - 10.3 mg/dL   Total Protein 7.3 6.5 - 8.1 g/dL   Albumin 3.7 3.5 - 5.0 g/dL   AST 38 15 - 41 U/L   ALT 15 (L) 17 - 63 U/L   Alkaline Phosphatase 86 38 - 126 U/L   Total Bilirubin 1.0 0.3 - 1.2 mg/dL   GFR calc non Af Amer >60 >60 mL/min   GFR calc Af Amer >60 >60 mL/min   Anion gap 18 (H) 5 - 15  Lactic acid, plasma  Result Value Ref Range   Lactic Acid, Venous 1.3 0.5 - 1.9 mmol/L  Fibrin derivatives D-Dimer ([facility] only)  Result Value Ref Range   Fibrin derivatives D-dimer ([facility]) 3,042.07 (H) 0.00 - 499.00 ng/mL (FEU)  Troponin I  Result Value Ref Range   Troponin I <0.03 <0.03 ng/mL   Dg Chest 2 View  Result Date: 04/30/2017 CLINICAL DATA:  Chest pain and shortness of breath for  3 days. Cough. COPD. Previous myocardial infarct. EXAM: CHEST  2 VIEW COMPARISON:  03/31/2017 FINDINGS: The heart size and mediastinal contours are within normal limits. Aortic atherosclerosis. Pulmonary emphysema again demonstrated. Both lungs are clear. No evidence of pleural effusion. Multiple old bilateral rib fracture deformities are again noted. Old lower thoracic vertebral body compression fracture and old right humeral neck fracture again seen. IMPRESSION: Emphysema.  No active cardiopulmonary disease. Electronically Signed   By: Myles Rosenthal M.D.   On: 04/30/2017 20:58   Dg Cervical Spine With Flex & Extend  Result Date: 04/02/2017 CLINICAL DATA:  62 year old male status post fall. Fracture at the tip of the C7 spinous process. EXAM: CERVICAL SPINE COMPLETE WITH FLEXION AND EXTENSION VIEWS COMPARISON:  CT cervical spine 03/31/2017. FINDINGS: Bulky calcified carotid atherosclerosis re - demonstrated in the neck. Normal prevertebral soft tissue contour. Straightening of cervical lordosis persists.  Stable cervicothoracic junction alignment. Cervical vertebral height and alignment appears stable on the neutral lateral view. The mildly displaced tip of C7 spinous process fractures re- demonstrated. Lateral views were obtained in flexion and extension and demonstrate little range of motion, but no abnormal motion. Maintained posterior element alignment bilaterally. AP alignment remains normal. Negative lung apices. C1-C2 alignment and joint space remains normal. Negative odontoid. IMPRESSION: 1. Persistent straightening of cervical lordosis. Little range of motion demonstrated with flexion/extension, but no abnormal motion identified to suggest instability. 2. Stable mildly displaced fracture at the tip of the C7 spinous process. 3. Bulky cervical carotid calcified atherosclerosis. Electronically Signed   By: Odessa Fleming M.D.   On: 04/02/2017 09:12   Ct Angio Chest Pe W And/or Wo Contrast  Result Date: 04/30/2017 CLINICAL DATA:  62 y/o M; chest pain, shortness of breath, abdominal pain. PE suspected, intermediate prob, positive D-dimer EXAM: CT ANGIOGRAPHY CHEST WITH CONTRAST TECHNIQUE: Multidetector CT imaging of the chest was performed using the standard protocol during bolus administration of intravenous contrast. Multiplanar CT image reconstructions and MIPs were obtained to evaluate the vascular anatomy. CONTRAST:  75mL ISOVUE-370 IOPAMIDOL (ISOVUE-370) INJECTION 76% COMPARISON:  08/07/2016 chest CT FINDINGS: Cardiovascular: Normal caliber thoracic aorta. Moderate thoracic and severe abdominal aortic atherosclerosis with extensive fibrofatty plaque. Normal heart size. No pericardial effusion. Severe coronary artery calcification. Satisfactory opacification of pulmonary arteries. No pulmonary embolus. Mediastinum/Nodes: No enlarged mediastinal, hilar, or axillary lymph nodes. Thyroid gland, trachea, and esophagus demonstrate no significant findings. Lungs/Pleura: Moderate centrilobular and paraseptal  emphysema of the lungs. Mild diffuse peribronchial thickening and scattered mucous plugging in the lung bases. No consolidation, effusion, or pneumothorax. There are several ground-glass and solid nodules in the bilateral lower lobes and left upper lobe measuring up to 13 mm (series 6, image 49) that are new from the prior study. 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 Predominantly calcified plaque of celiac origin with moderate 50-70% stenosis. Musculoskeletal: Stable T12 mild anterior compression deformity. No acute fracture identified. Multiple chronic posterior rib fractures bilaterally. Review of the MIP images confirms the above findings. IMPRESSION: 1. No pulmonary embolus identified. 2. Peribronchial thickening and scattered mucous plugging compatible with infectious/inflammatory bronchitis. 3. Several new ground-glass and solid nodules measuring up to 13 mm, probably representing areas of pneumonitis. Non-contrast chest CT at 3-6 months is recommended. If the nodules are stable at time of repeat CT, then future CT at 18-24 months (from today's scan) is considered optional for low-risk patients, but is recommended for high-risk patients. This recommendation follows the consensus statement: Guidelines for Management of  Incidental Pulmonary Nodules Detected on CT Images: From the Fleischner Society 2017; Radiology 2017; 284:228-243. 4. Severe coronary artery calcification. Moderate thoracic and severe abdominal aortic atherosclerosis with extensive fibrofatty plaque. 5. 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 6. Moderate emphysema of lungs. 7. Chronic mild T12 compression deformity. Multiple chronic rib fractures. Electronically Signed   By: Mitzi Hansen M.D.   On: 04/30/2017 23:37

## 2017-05-03 ENCOUNTER — Emergency Department (HOSPITAL_COMMUNITY)
Admission: EM | Admit: 2017-05-03 | Discharge: 2017-05-03 | Disposition: A | Discharge status: Home / Self Care | Payer: Medicaid Other | Attending: Emergency Medicine | Admitting: Emergency Medicine

## 2017-05-03 ENCOUNTER — Encounter (HOSPITAL_COMMUNITY): Payer: Self-pay

## 2017-05-03 ENCOUNTER — Other Ambulatory Visit: Payer: Self-pay

## 2017-05-03 DIAGNOSIS — Z87891 Personal history of nicotine dependence: Secondary | ICD-10-CM | POA: Insufficient documentation

## 2017-05-03 DIAGNOSIS — K5900 Constipation, unspecified: Secondary | ICD-10-CM | POA: Diagnosis not present

## 2017-05-03 DIAGNOSIS — Z7982 Long term (current) use of aspirin: Secondary | ICD-10-CM | POA: Diagnosis not present

## 2017-05-03 DIAGNOSIS — J449 Chronic obstructive pulmonary disease, unspecified: Secondary | ICD-10-CM | POA: Diagnosis not present

## 2017-05-03 DIAGNOSIS — Z79899 Other long term (current) drug therapy: Secondary | ICD-10-CM | POA: Diagnosis not present

## 2017-05-03 DIAGNOSIS — I5022 Chronic systolic (congestive) heart failure: Secondary | ICD-10-CM | POA: Diagnosis not present

## 2017-05-03 DIAGNOSIS — J45909 Unspecified asthma, uncomplicated: Secondary | ICD-10-CM | POA: Insufficient documentation

## 2017-05-03 DIAGNOSIS — I110 Hypertensive heart disease with heart failure: Secondary | ICD-10-CM | POA: Diagnosis not present

## 2017-05-03 DIAGNOSIS — R1012 Left upper quadrant pain: Secondary | ICD-10-CM | POA: Insufficient documentation

## 2017-05-03 LAB — CBC WITH DIFFERENTIAL/PLATELET
Basophils Absolute: 0 10*3/uL (ref 0.0–0.1)
Basophils Relative: 0 %
Eosinophils Absolute: 0 10*3/uL (ref 0.0–0.7)
Eosinophils Relative: 1 %
HCT: 37.2 % — ABNORMAL LOW (ref 39.0–52.0)
Hemoglobin: 12.3 g/dL — ABNORMAL LOW (ref 13.0–17.0)
Lymphocytes Relative: 29 %
Lymphs Abs: 2.2 10*3/uL (ref 0.7–4.0)
MCH: 33.2 pg (ref 26.0–34.0)
MCHC: 33.1 g/dL (ref 30.0–36.0)
MCV: 100.5 fL — ABNORMAL HIGH (ref 78.0–100.0)
Monocytes Absolute: 0.9 10*3/uL (ref 0.1–1.0)
Monocytes Relative: 11 %
Neutro Abs: 4.6 10*3/uL (ref 1.7–7.7)
Neutrophils Relative %: 59 %
Platelets: 144 10*3/uL — ABNORMAL LOW (ref 150–400)
RBC: 3.7 MIL/uL — ABNORMAL LOW (ref 4.22–5.81)
RDW: 13.5 % (ref 11.5–15.5)
WBC: 7.8 10*3/uL (ref 4.0–10.5)

## 2017-05-03 LAB — COMPREHENSIVE METABOLIC PANEL
ALT: 14 U/L — ABNORMAL LOW (ref 17–63)
AST: 31 U/L (ref 15–41)
Albumin: 3.6 g/dL (ref 3.5–5.0)
Alkaline Phosphatase: 61 U/L (ref 38–126)
Anion gap: 9 (ref 5–15)
BUN: 15 mg/dL (ref 6–20)
CO2: 30 mmol/L (ref 22–32)
Calcium: 8.9 mg/dL (ref 8.9–10.3)
Chloride: 93 mmol/L — ABNORMAL LOW (ref 101–111)
Creatinine, Ser: 0.71 mg/dL (ref 0.61–1.24)
GFR calc Af Amer: >60 mL/min (ref >60)
GFR calc non Af Amer: >60 mL/min (ref >60)
Glucose, Bld: 102 mg/dL — ABNORMAL HIGH (ref 65–99)
Potassium: 3.2 mmol/L — ABNORMAL LOW (ref 3.5–5.1)
Sodium: 132 mmol/L — ABNORMAL LOW (ref 135–145)
Total Bilirubin: 0.6 mg/dL (ref 0.3–1.2)
Total Protein: 7.2 g/dL (ref 6.5–8.1)

## 2017-05-03 LAB — I-STAT TROPONIN, ED: Troponin i, poc: 0.01 ng/mL (ref 0.00–0.08)

## 2017-05-03 LAB — LIPASE, BLOOD: Lipase: 29 U/L (ref 11–51)

## 2017-05-03 MED ORDER — DOCUSATE SODIUM 100 MG PO CAPS: 100.0000 mg | ORAL_CAPSULE | Freq: Two times a day (BID) | ORAL | 0 refills | Status: AC

## 2017-05-03 MED ORDER — FAMOTIDINE IN NACL 20-0.9 MG/50ML-% IV SOLN
20.0000 mg | Freq: Once | INTRAVENOUS | Status: AC
  Administered 2017-05-03: 20 mg via INTRAVENOUS
  Filled 2017-05-03: qty 50

## 2017-05-03 MED ORDER — SUCRALFATE 1 G PO TABS
1.0000 g | ORAL_TABLET | Freq: Once | ORAL | Status: AC
Start: 2017-05-03 — End: 2017-05-03
  Administered 2017-05-03: 1 g via ORAL
  Filled 2017-05-03: qty 1

## 2017-05-03 MED ORDER — SUCRALFATE 1 G PO TABS: 1.0000 g | ORAL_TABLET | Freq: Three times a day (TID) | ORAL | 0 refills | Status: DC

## 2017-05-03 MED ORDER — SODIUM CHLORIDE 0.9 % IV BOLUS (SEPSIS)
1000.0000 mL | Freq: Once | INTRAVENOUS | Status: AC
  Administered 2017-05-03: 1000 mL via INTRAVENOUS

## 2017-05-03 MED ORDER — GI COCKTAIL ~~LOC~~
30.0000 mL | Freq: Once | ORAL | Status: AC
  Administered 2017-05-03: 30 mL via ORAL
  Filled 2017-05-03: qty 30

## 2017-05-03 NOTE — ED Notes (Signed)
Bed: FO27 Expected date: 05/03/17 Expected time: 4:41 PM Means of arrival: Ambulance Comments: 2 days not voiding/constipation

## 2017-05-03 NOTE — Discharge Instructions (Signed)
Start taking Carafate with meals and at night.  Take stool softener twice daily.  Drink plenty of fluids.  Eat a diet high in fiber with lots of fruits and vegetables.  Avoid foods that will upset the stomach such as spicy foods.  Avoid alcohol.  Follow-up with your primary care physician for reevaluation of symptoms.  Return to the emergency department if any concerning signs or symptoms develop such as fever, persistent nausea and vomiting, or blood in the urine and stool.

## 2017-05-03 NOTE — ED Triage Notes (Signed)
EMS reports burning abdominal pain radiating upwards, constipation, Pt states unable to have BM x 3 days. Seen at [facility] for same Monday. Hx asthma, Hypertension  BP 150/80 HR 110 resp 18 CBG 287

## 2017-05-03 NOTE — ED Provider Notes (Signed)
[facility]-EMERGENCY DEPT Provider Note   CSN: [id number] Arrival date & time: 05/03/17  1646     History   Chief Complaint Chief Complaint  Patient presents with  . Abdominal Pain  . Constipation    HPI [facility] is a 62 y.o. male with history of asthma, COPD, HTN, dementia, chronic systolic heart failure, GERD, alcohol abuse, reflux esophagitis and gastritis presents today with chief complaint acute onset, constant upper abdominal pain for 3 days.  Describes the pain as aching and cramping.  Pain radiates all over the anterior trunk of the chest.  Pain worsens after eating and he endorses decreased appetite but states he is able to tolerate fluids.  Denies nausea or vomiting.  No fevers or chills.  States his last bowel movement was at least 3 days ago when he typically has bowel movements on a daily basis.  States that he took a "pill for my belly pain "but is unsure what it was.  States he is unsure if this feels like his usual gastritis symptoms.  Denies urinary symptoms, rectal pain, fevers, chills.  He notes that the chest pain he was seen and evaluated at [facility] 3 days ago had resolved.  He states that he has been taking the antibiotic and steroids he was discharged with for treatment of bronchitis as prescribed.  He denies shortness of breath.  He is currently a non-smoker.  No recent travel or surgeries, no prior history of DVT or PE, he is not on testosterone placement therapy, no hemoptysis.   The history is provided by the patient.   Constipation    Associated symptoms include abdominal pain. Pertinent negatives include no dysuria.    Past Medical History:  Diagnosis Date  . Asthma   . COPD (chronic obstructive pulmonary disease) (HCC)   . Hypertension     Patient Active Problem List   Diagnosis Date Noted  . Syncope 04/01/2017  . Pressure injury of skin 04/01/2017  . Fall 02/18/2017  . Hypokalemia 11/06/2016  .  Dementia 11/06/2016  . Chronic systolic heart failure (HCC) 11/01/2016  . Dehydration 08/28/2016  . General weakness 09/03/2015  . Generalized weakness 09/02/2015  . Hyponatremia 09/02/2015  . HTN (hypertension) 09/02/2015  . GERD (gastroesophageal reflux disease) 09/02/2015  . Hypomagnesemia 06/23/2015  . Leukocytosis 06/23/2015  . Alcohol abuse 06/23/2015  . Thrombocytopenia (HCC) 06/23/2015  . Reflux esophagitis   . Gastritis     Past Surgical History:  Procedure Laterality Date  . CATARACT EXTRACTION, BILATERAL Bilateral   . ESOPHAGOGASTRODUODENOSCOPY (EGD) WITH PROPOFOL N/A 06/21/2015   Procedure: ESOPHAGOGASTRODUODENOSCOPY (EGD) WITH PROPOFOL;  Surgeon: Midge Minium, MD;  Location: [facility] ENDOSCOPY;  Service: Endoscopy;  Laterality: N/A;       Home Medications    Prior to Admission medications   Medication Sig Start Date End Date Taking? Authorizing Provider  albuterol (PROVENTIL HFA;VENTOLIN HFA) 108 (90 Base) MCG/ACT inhaler Inhale 2 puffs into the lungs 2 (two) times daily.    [provider]  aspirin 81 MG chewable tablet Chew 1 tablet (81 mg total) by mouth daily. 10/13/16   Adrian Saran, MD  azithromycin (ZITHROMAX Z-PAK) 250 MG tablet Take 2 tablets (500 mg) on  Day 1,  followed by 1 tablet (250 mg) once daily on Days 2 through 5. 05/01/17   Merrily Brittle, MD  buPROPion (WELLBUTRIN XL) 150 MG 24 hr tablet Take 150 mg by mouth daily.    [provider]  carvedilol (  COREG) 3.125 MG tablet Take 1 tablet (3.125 mg total) by mouth 2 (two) times daily with a meal. 11/01/16   Delma Freeze, FNP  docusate sodium (COLACE) 100 MG capsule Take 1 capsule (100 mg total) by mouth every 12 (twelve) hours for 10 days. 05/03/17 05/13/17  Michela Pitcher A, PA-C  donepezil (ARICEPT) 5 MG tablet Take 5 mg by mouth at bedtime.    [provider]  feeding supplement, ENSURE ENLIVE, (ENSURE ENLIVE) LIQD Take 237 mLs by mouth 2 (two) times daily between meals. 08/13/16   Adrian Saran, MD  Fluticasone-Salmeterol (ADVAIR) 500-50 MCG/DOSE AEPB Inhale 1 puff into the lungs 2 (two) times daily.    [provider]  lisinopril (PRINIVIL,ZESTRIL) 20 MG tablet Take 10 mg by mouth daily.    [provider]  meloxicam (MOBIC) 7.5 MG tablet Take 7.5 mg by mouth daily.    [provider]  pantoprazole (PROTONIX) 40 MG tablet Take 1 tablet (40 mg total) by mouth daily. 10/13/16   Adrian Saran, MD  potassium chloride SA (K-DUR,KLOR-CON) 20 MEQ tablet Take 40 mEq by mouth daily.     [provider]  predniSONE (DELTASONE) 50 MG tablet Take 1 tablet (50 mg total) by mouth daily for 4 days. 05/01/17 05/05/17  Merrily Brittle, MD  rosuvastatin (CRESTOR) 10 MG tablet Take 1 tablet (10 mg total) by mouth daily. 11/01/16   Delma Freeze, FNP  sucralfate (CARAFATE) 1 g tablet Take 1 tablet (1 g total) by mouth 4 (four) times daily -  with meals and at bedtime for 7 days. 05/03/17 05/10/17  Michela Pitcher A, PA-C  Tiotropium Bromide-Olodaterol (STIOLTO RESPIMAT) 2.5-2.5 MCG/ACT AERS Inhale 2 puffs into the lungs daily.    [provider]  traZODone (DESYREL) 50 MG tablet Take 50 mg by mouth at bedtime.    [provider]    Family History Family History  Problem Relation Age of Onset  . Diabetes Unknown   . Hypertension Unknown     Social History Social History   Tobacco Use  . Smoking status: Former Smoker    Types: Cigarettes    Last attempt to quit: 06/21/2014    Years since quitting: 2.8  . Smokeless tobacco: Current User    Types: Chew  Substance Use Topics  . Alcohol use: Yes    Alcohol/week: 1.2 oz    Types: 2 Cans of beer per week    Comment: per day  . Drug use: No     Allergies   Codeine   Review of Systems Review of Systems  Constitutional: Negative for chills and fever.  Respiratory: Negative for shortness of breath.   Cardiovascular: Negative for chest pain.  Gastrointestinal: Positive for abdominal pain and  constipation. Negative for blood in stool, diarrhea, nausea, rectal pain and vomiting.  Genitourinary: Negative for dysuria, frequency, hematuria and urgency.  All other systems reviewed and are negative.    Physical Exam Updated Vital Signs BP (!) 165/97   Pulse 85   Temp 98.2 F (36.8 C)   Resp 18   Ht 5\' 5"  (1.651 m)   Wt 43.1 kg (95 lb)   SpO2 96%   BMI 15.81 kg/m    Physical Exam  Constitutional: He appears well-developed and well-nourished. No distress.  Thin, appears older than stated age  HENT:  Head: Normocephalic and atraumatic.  Eyes: Conjunctivae and EOM are normal. Pupils are equal, round, and reactive to light. Right eye exhibits no discharge. Left  eye exhibits no discharge.  Neck: No JVD present. No tracheal deviation present.  Cardiovascular: Normal rate, regular rhythm, normal heart sounds and intact distal pulses.  Distant heart sounds, 2+ radial and DP/PT pulses bl, negative Homan's bl, no LE edema  Pulmonary/Chest: Effort normal and breath sounds normal.  Abdominal: Bowel sounds are normal. He exhibits no distension. There is tenderness in the epigastric area and left upper quadrant. There is no rigidity, no rebound, no guarding, no CVA tenderness, no tenderness at McBurney's point and negative Murphy's sign.  Abdomen concave, soft  Musculoskeletal: He exhibits no edema.  Neurological: He is alert.  Skin: Skin is warm and dry. No erythema.  Psychiatric: He has a normal mood and affect. His behavior is normal.  Nursing note and vitals reviewed.    ED Treatments / Results  Labs (all labs ordered are listed, but only abnormal results are displayed) Labs Reviewed  CBC WITH DIFFERENTIAL/PLATELET - Abnormal; Notable for the following components:      Result Value   RBC 3.70 (*)    Hemoglobin 12.3 (*)    HCT 37.2 (*)    MCV 100.5 (*)    Platelets 144 (*)    All other components within normal limits  COMPREHENSIVE METABOLIC PANEL - Abnormal; Notable for  the following components:   Sodium 132 (*)    Potassium 3.2 (*)    Chloride 93 (*)    Glucose, Bld 102 (*)    ALT 14 (*)    All other components within normal limits  LIPASE, BLOOD  I-STAT TROPONIN, ED    EKG  EKG Interpretation  Date/Time:  Saturday May 03 2017 18:31:50 EST Ventricular Rate:  91 PR Interval:    QRS Duration: 91 QT Interval:  403 QTC Calculation: 496 R Axis:   63 Text Interpretation:  SR Low voltage, precordial leads Anteroseptal infarct, old Nonspecific T abnormalities, lateral leads no interval change from previous Confirmed by Arby Barrette [phone]) on 05/03/2017 7:31:15 PM       Radiology No results found.  Procedures Procedures (including critical care time)  Medications Ordered in ED Medications  sodium chloride 0.9 % bolus 1,000 mL (0 mLs Intravenous Stopped 05/03/17 2018)  gi cocktail (Maalox,Lidocaine,Donnatal) (30 mLs Oral Given 05/03/17 1823)  sucralfate (CARAFATE) tablet 1 g (1 g Oral Given 05/03/17 1823)  famotidine (PEPCID) IVPB 20 mg premix (0 mg Intravenous Stopped 05/03/17 1837)     Initial Impression / Assessment and Plan / ED Course  I have reviewed the triage vital signs and the nursing notes.  Pertinent labs & imaging results that were available during my care of the patient were reviewed by me and considered in my medical decision making (see chart for details).     Patient presents with left upper quadrant and epigastric pain for 3 days and constipation for 3 days.  Afebrile, initially tachycardic while in the ED.  Blood pressure is at patient's baseline.  No increased work of breathing or hypoxia.  He denies shortness of breath or chest pain at this time.  Troponin is negative, EKG shows no significant changes from last tracing, and I doubt ACS or MI.  No leukocytosis, mild anemia which is at patient's baseline. CMP shows mild hyponatremia and hypokalemia and hypochloremia, given normal saline bolus.  Remainder of lab work is  unremarkable.  patient was given fluids, GI cocktail, Carafate, and Pepcid with significant improvement in his symptoms.  On reevaluation, patient is resting comfortably and states he is feeling much  better.  He is requesting food.  He has tolerated p.o. food and fluids without difficulty.  His tachycardia resolved after administration of fluids and I suspect he was dehydrated.  I doubt obstruction, perforation, appendicitis, AAA, or other acute surgical abdominal pathology.  Suspect pain in the left upper quadrant and epigastric regions are related to his known GERD.  He has a history of gastritis.  He takes Protonix at home on a daily basis.  We will also discharged with Carafate and stool softener for his symptoms.  Repeat abdominal examination is unremarkable.  He is stable for discharge home with follow-up with his primary care physician for reevaluation of his symptoms.  Discussed indications for return to the ED. Pt verbalized understanding of and agreement with plan and is safe for discharge home at this time.  No complaints prior to discharge.  Final Clinical Impressions(s) / ED Diagnoses   Final diagnoses:  Left upper quadrant pain  Constipation, unspecified constipation type    ED Discharge Orders        Ordered    sucralfate (CARAFATE) 1 g tablet  3 times daily with meals & bedtime     05/03/17 2006    docusate sodium (COLACE) 100 MG capsule  Every 12 hours     05/03/17 2006      Bennye Alm 05/04/17 0121  Arby Barrette, MD 05/05/17 [phone]

## 2017-05-30 ENCOUNTER — Ambulatory Visit: Payer: Medicaid Other | Admitting: Family

## 2017-06-06 NOTE — Progress Notes (Signed)
Patient ID: Caleb Carpenter, male    DOB: November 16, 1955, 62 y.o.   MRN: [id number]  HPI  Caleb Carpenter is a 62 y/o male with a history of HTN, COPD, asthma, hypokalemia, hyponatremia, current chewing tobacco use, alcohol use and chronic heart failure.   Reviewed last echo report done on 10/11/16 which showed an EF of 30-35% along with mild Caleb, moderate TR and severely elevated PA pressure of 70 mm Hg.   Was in the ED 05/03/17 due to left upper quadrant pain. Medications were given and patient felt better so was released. Was in the ED 04/30/17 due to COPD exacerbation. CT showed bronchitis. Was given steroids and antibiotics and was released. Admitted 03/31/17 due to syncope due to orthostasis, alcohol intoxication and poor cardiac output. Had cervical spine fracture and neurosurgery was consulted. Discharged after 2 days. Was in the ED 01/30/17 due to weakness. Evaluated and released.   He presents today for his follow-up visit with a chief complaint of minimal fatigue upon moderate exertion. He describes this as chronic in nature having been present for years. He has associated cough, difficulty sleeping and gradual weight gain along with this. He denies any abdominal distention, palpitations, edema, chest pain, dizziness or shortness of breath.   Past Medical History:  Diagnosis Date  . Asthma   . COPD (chronic obstructive pulmonary disease) (HCC)   . Hypertension    Past Surgical History:  Procedure Laterality Date  . CATARACT EXTRACTION, BILATERAL Bilateral   . ESOPHAGOGASTRODUODENOSCOPY (EGD) WITH PROPOFOL N/A 06/21/2015   Procedure: ESOPHAGOGASTRODUODENOSCOPY (EGD) WITH PROPOFOL;  Surgeon: Midge Minium, MD;  Location: [facility] ENDOSCOPY;  Service: Endoscopy;  Laterality: N/A;   Family History  Problem Relation Age of Onset  . Diabetes Unknown   . Hypertension Unknown    Social History   Tobacco Use  . Smoking status: Former Smoker    Types: Cigarettes    Last attempt to quit: 06/21/2014    Years  since quitting: 2.9  . Smokeless tobacco: Current User    Types: Chew  Substance Use Topics  . Alcohol use: Yes    Alcohol/week: 1.2 oz    Types: 2 Cans of beer per week    Comment: per day   Allergies  Allergen Reactions  . Codeine Other (See Comments)    Nasal drip   Prior to Admission medications   Medication Sig Start Date End Date Taking? Authorizing Provider  albuterol (PROVENTIL HFA;VENTOLIN HFA) 108 (90 Base) MCG/ACT inhaler Inhale 2 puffs into the lungs 2 (two) times daily.   Yes [provider]  aspirin 81 MG chewable tablet Chew 1 tablet (81 mg total) by mouth daily. 10/13/16  Yes Adrian Saran, MD  carvedilol (COREG) 3.125 MG tablet Take 1 tablet (3.125 mg total) by mouth 2 (two) times daily with a meal. 11/01/16  Yes Clarisa Kindred A, FNP  Multiple Vitamin (MULTIVITAMIN WITH MINERALS) TABS tablet Take 1 tablet by mouth daily.   Yes [provider]  sucralfate (CARAFATE) 1 g tablet Take 1 g by mouth 4 (four) times daily -  with meals and at bedtime.   Yes [provider]  Tiotropium Bromide-Olodaterol (STIOLTO RESPIMAT) 2.5-2.5 MCG/ACT AERS Inhale 2 puffs into the lungs daily.   Yes [provider]  traZODone (DESYREL) 50 MG tablet Take 50 mg by mouth at bedtime.   Yes [provider]  lisinopril (PRINIVIL,ZESTRIL) 20 MG tablet Take 10 mg by mouth daily.    [provider]  meloxicam (  MOBIC) 7.5 MG tablet Take 7.5 mg by mouth daily.    [provider]    Review of Systems  Constitutional: Positive for fatigue (minimal). Negative for appetite change.  HENT: Negative for congestion, rhinorrhea and sore throat.   Eyes: Negative.   Respiratory: Positive for cough (at times). Negative for chest tightness and shortness of breath.   Cardiovascular: Negative for chest pain, palpitations and leg swelling.  Gastrointestinal: Negative for abdominal distention and abdominal pain.  Endocrine: Negative.   Genitourinary:  Negative.   Musculoskeletal: Positive for arthralgias (left knee/left hip). Negative for back pain and neck pain.  Skin: Negative.   Allergic/Immunologic: Negative.   Neurological: Negative for dizziness and light-headedness.       Memory loss  Hematological: Negative for adenopathy. Bruises/bleeds easily.  Psychiatric/Behavioral: Positive for sleep disturbance (trouble falling asleep; sleeping on 1 pillow). Negative for dysphoric mood. The patient is not nervous/anxious.    Vitals:   06/09/17 1351  BP: 126/89  Pulse: (!) 107  Resp: 18  SpO2: 95%  Weight: 102 lb 2 oz (46.3 kg)  Height: 5\' 5"  (1.651 m)   Wt Readings from Last 3 Encounters:  06/09/17 102 lb 2 oz (46.3 kg)  05/03/17 95 lb (43.1 kg)  04/30/17 94 lb (42.6 kg)   Lab Results  Component Value Date   CREATININE 0.71 05/03/2017   CREATININE 0.72 04/30/2017   CREATININE 0.66 03/31/2017    Physical Exam  Constitutional: He is oriented to person, place, and time. He appears well-developed.  thin  HENT:  Head: Normocephalic and atraumatic.  Neck: Normal range of motion. Neck supple. No JVD present.  Cardiovascular: Regular rhythm. Tachycardia present.  Pulmonary/Chest: Effort normal. He has no wheezes. He has no rhonchi. He has no rales.  Abdominal: Soft. He exhibits no distension. There is no tenderness.  Musculoskeletal: He exhibits no edema.       Left knee: He exhibits no swelling and no ecchymosis.  Neurological: He is alert and oriented to person, place, and time.  Skin: Skin is warm and dry.  Psychiatric: He has a normal mood and affect. His behavior is normal. Thought content normal.  Nursing note and vitals reviewed.  Assessment & Plan:  1: Chronic heart failure with reduced ejection fraction- - NYHA class II - euvolemic - not weighing daily . Encouraged to resume weighing daily and to call for an overnight weight gain of >2 pounds or a weekly weight gain of >5 pounds.  - weight up 8 pounds since he was  last here - not adding salt and is trying to follow a low sodium diet although he sometimes eats food higher in sodium due to finances - does not meet REDS vest criteria due to low BMI - says that he's eating better and has started the process of getting food stamps - patient reports already receiving his flu vaccine for this season  2: HTN- - BP looks good today  - sees PCP (Niemyer) on 06/12/17 - BMP from 05/03/17 reviewed and shows sodium 132, potassium 3.2 and GFR >60  3: Dementia- - does have memory loss  - patient says that he's currently managing his medications himself by looking at the colors of the tablets due to not being able to read although he does say that he takes everything just at bedtime - living with a friend and relying on them for transportation  Patient did not bring his medications nor a list. Each medication was verbally reviewed with the patient  and he was encouraged to bring the bottles to every visit to confirm accuracy of list. Received copy from pharmacy of medications over the last month that he's bought.   Return in 6 months or sooner for any questions/problems before then.

## 2017-06-09 ENCOUNTER — Encounter: Payer: Self-pay | Admitting: Family

## 2017-06-09 ENCOUNTER — Ambulatory Visit: Payer: Medicaid Other | Attending: Family | Admitting: Family

## 2017-06-09 VITALS — BP 126/89 | HR 107 | Resp 18 | Ht 65.0 in | Wt 102.1 lb

## 2017-06-09 DIAGNOSIS — I509 Heart failure, unspecified: Secondary | ICD-10-CM | POA: Diagnosis present

## 2017-06-09 DIAGNOSIS — E876 Hypokalemia: Secondary | ICD-10-CM | POA: Diagnosis not present

## 2017-06-09 DIAGNOSIS — I5022 Chronic systolic (congestive) heart failure: Secondary | ICD-10-CM

## 2017-06-09 DIAGNOSIS — Z7982 Long term (current) use of aspirin: Secondary | ICD-10-CM | POA: Diagnosis not present

## 2017-06-09 DIAGNOSIS — F0390 Unspecified dementia without behavioral disturbance: Secondary | ICD-10-CM | POA: Insufficient documentation

## 2017-06-09 DIAGNOSIS — F0280 Dementia in other diseases classified elsewhere without behavioral disturbance: Secondary | ICD-10-CM

## 2017-06-09 DIAGNOSIS — I110 Hypertensive heart disease with heart failure: Secondary | ICD-10-CM | POA: Diagnosis not present

## 2017-06-09 DIAGNOSIS — G309 Alzheimer's disease, unspecified: Secondary | ICD-10-CM

## 2017-06-09 DIAGNOSIS — F1010 Alcohol abuse, uncomplicated: Secondary | ICD-10-CM | POA: Diagnosis not present

## 2017-06-09 DIAGNOSIS — F17220 Nicotine dependence, chewing tobacco, uncomplicated: Secondary | ICD-10-CM | POA: Diagnosis not present

## 2017-06-09 DIAGNOSIS — Z79899 Other long term (current) drug therapy: Secondary | ICD-10-CM | POA: Diagnosis not present

## 2017-06-09 DIAGNOSIS — J449 Chronic obstructive pulmonary disease, unspecified: Secondary | ICD-10-CM | POA: Insufficient documentation

## 2017-06-09 DIAGNOSIS — I10 Essential (primary) hypertension: Secondary | ICD-10-CM

## 2017-06-09 DIAGNOSIS — Z87891 Personal history of nicotine dependence: Secondary | ICD-10-CM | POA: Diagnosis not present

## 2017-06-09 NOTE — Patient Instructions (Addendum)
Resume weighing daily and call for an overnight weight gain of > 2 pounds or a weekly weight gain of >5 pounds. 

## 2017-10-26 ENCOUNTER — Emergency Department
Admission: EM | Admit: 2017-10-26 | Discharge: 2017-10-27 | Disposition: A | Discharge status: Home / Self Care | Payer: Medicaid Other | Attending: Emergency Medicine | Admitting: Emergency Medicine

## 2017-10-26 ENCOUNTER — Other Ambulatory Visit: Payer: Self-pay

## 2017-10-26 ENCOUNTER — Emergency Department: Payer: Medicaid Other

## 2017-10-26 DIAGNOSIS — F10929 Alcohol use, unspecified with intoxication, unspecified: Secondary | ICD-10-CM | POA: Diagnosis not present

## 2017-10-26 DIAGNOSIS — J45909 Unspecified asthma, uncomplicated: Secondary | ICD-10-CM | POA: Diagnosis not present

## 2017-10-26 DIAGNOSIS — I5022 Chronic systolic (congestive) heart failure: Secondary | ICD-10-CM | POA: Diagnosis not present

## 2017-10-26 DIAGNOSIS — F0390 Unspecified dementia without behavioral disturbance: Secondary | ICD-10-CM | POA: Insufficient documentation

## 2017-10-26 DIAGNOSIS — Z87891 Personal history of nicotine dependence: Secondary | ICD-10-CM | POA: Insufficient documentation

## 2017-10-26 DIAGNOSIS — R079 Chest pain, unspecified: Secondary | ICD-10-CM

## 2017-10-26 DIAGNOSIS — I110 Hypertensive heart disease with heart failure: Secondary | ICD-10-CM | POA: Insufficient documentation

## 2017-10-26 DIAGNOSIS — Z7982 Long term (current) use of aspirin: Secondary | ICD-10-CM | POA: Insufficient documentation

## 2017-10-26 DIAGNOSIS — Z79899 Other long term (current) drug therapy: Secondary | ICD-10-CM | POA: Insufficient documentation

## 2017-10-26 LAB — CBC
HEMATOCRIT: 34.6 % — AB (ref 40.0–52.0)
Hemoglobin: 11.7 g/dL — ABNORMAL LOW (ref 13.0–18.0)
MCH: 33.8 pg (ref 26.0–34.0)
MCHC: 33.8 g/dL (ref 32.0–36.0)
MCV: 100.1 fL — AB (ref 80.0–100.0)
Platelets: 291 10*3/uL (ref 150–440)
RBC: 3.46 MIL/uL — AB (ref 4.40–5.90)
RDW: 17.3 % — ABNORMAL HIGH (ref 11.5–14.5)
WBC: 5.1 10*3/uL (ref 3.8–10.6)

## 2017-10-26 LAB — BASIC METABOLIC PANEL
Anion gap: 8 (ref 5–15)
BUN: <5 mg/dL — ABNORMAL LOW (ref 8–23)
CHLORIDE: 106 mmol/L (ref 98–111)
CO2: 24 mmol/L (ref 22–32)
Calcium: 7.7 mg/dL — ABNORMAL LOW (ref 8.9–10.3)
Creatinine, Ser: 0.56 mg/dL — ABNORMAL LOW (ref 0.61–1.24)
GFR calc non Af Amer: >60 mL/min (ref >60)
Glucose, Bld: 97 mg/dL (ref 70–99)
POTASSIUM: 4.2 mmol/L (ref 3.5–5.1)
SODIUM: 138 mmol/L (ref 135–145)

## 2017-10-26 LAB — TROPONIN I
Troponin I: <0.03 ng/mL (ref <0.03)
Troponin I: <0.03 ng/mL (ref <0.03)

## 2017-10-26 LAB — HEPATIC FUNCTION PANEL
ALT: 8 U/L (ref 0–44)
AST: 26 U/L (ref 15–41)
Albumin: 2.5 g/dL — ABNORMAL LOW (ref 3.5–5.0)
Alkaline Phosphatase: 42 U/L (ref 38–126)
BILIRUBIN DIRECT: 0.3 mg/dL — AB (ref 0.0–0.2)
BILIRUBIN INDIRECT: 0.1 mg/dL — AB (ref 0.3–0.9)
BILIRUBIN TOTAL: 0.4 mg/dL (ref 0.3–1.2)
Total Protein: 5.2 g/dL — ABNORMAL LOW (ref 6.5–8.1)

## 2017-10-26 LAB — LACTIC ACID, PLASMA: Lactic Acid, Venous: 1.5 mmol/L (ref 0.5–1.9)

## 2017-10-26 LAB — LIPASE, BLOOD: Lipase: 48 U/L (ref 11–51)

## 2017-10-26 LAB — ETHANOL: ALCOHOL ETHYL (B): 123 mg/dL — AB (ref <10)

## 2017-10-26 MED ORDER — ASPIRIN 81 MG PO CHEW
324.0000 mg | CHEWABLE_TABLET | Freq: Once | ORAL | Status: AC
  Administered 2017-10-26: 324 mg via ORAL
  Filled 2017-10-26: qty 4

## 2017-10-26 NOTE — ED Notes (Signed)
ED Provider at bedside. 

## 2017-10-26 NOTE — ED Triage Notes (Signed)
Pt arrived via ems for c/o chest pain - the pain started around 7am and has not gotten better - he reports shortness of breath but denies N/V - he reports the pain is in the left side of his chest and radiates around left side and into back

## 2017-10-26 NOTE — ED Provider Notes (Signed)
[facility] Emergency Department Provider Note  ____________________________________________   First MD Initiated Contact with Patient 10/26/17 1906     (approximate)  I have reviewed the triage vital signs and the nursing notes.   HISTORY  Chief Complaint Chest Pain    HPI Caleb Carpenter is a 62 y.o. male with extensive chronic medical history and multiple recent emergency department visits.  He presents for evaluation today by EMS for evaluation of chest pain.  He states that he had some chest pain early this morning that had not gone away by this evening so he came in for evaluation.  However when I came to see him he states that the pain has completely gone away and he feels fine.  He has had this kind of pain before and has been seen in the emergency department for it in the past.  Nothing in particular makes it better or worse.  He reports that it was mostly on the left side of his chest and radiates around to the side.  He describes the pain is sharp and it can be severe at times although it is currently gone.  He denies shortness of breath, fever/chills, nausea, vomiting, abdominal pain, and dysuria.  He reports that he knows he is chronically ill (he weighs only about 35 kg) and that his PCP, Dr. Lacie Scotts, has told him that he is slowly dying.  Past Medical History:  Diagnosis Date  . Asthma   . COPD (chronic obstructive pulmonary disease) (HCC)   . Hypertension     Patient Active Problem List   Diagnosis Date Noted  . Syncope 04/01/2017  . Pressure injury of skin 04/01/2017  . Fall 02/18/2017  . Hypokalemia 11/06/2016  . Dementia 11/06/2016  . Chronic systolic heart failure (HCC) 11/01/2016  . Dehydration 08/28/2016  . General weakness 09/03/2015  . Generalized weakness 09/02/2015  . Hyponatremia 09/02/2015  . HTN (hypertension) 09/02/2015  . GERD (gastroesophageal reflux disease) 09/02/2015  . Hypomagnesemia 06/23/2015  . Leukocytosis  06/23/2015  . Alcohol abuse 06/23/2015  . Thrombocytopenia (HCC) 06/23/2015  . Reflux esophagitis   . Gastritis     Past Surgical History:  Procedure Laterality Date  . CATARACT EXTRACTION, BILATERAL Bilateral   . ESOPHAGOGASTRODUODENOSCOPY (EGD) WITH PROPOFOL N/A 06/21/2015   Procedure: ESOPHAGOGASTRODUODENOSCOPY (EGD) WITH PROPOFOL;  Surgeon: Midge Minium, MD;  Location: [facility] ENDOSCOPY;  Service: Endoscopy;  Laterality: N/A;    Prior to Admission medications   Medication Sig Start Date End Date Taking? Authorizing Provider  albuterol (PROVENTIL HFA;VENTOLIN HFA) 108 (90 Base) MCG/ACT inhaler Inhale 2 puffs into the lungs 2 (two) times daily.    [provider]  aspirin 81 MG chewable tablet Chew 1 tablet (81 mg total) by mouth daily. 10/13/16   Adrian Saran, MD  carvedilol (COREG) 3.125 MG tablet Take 1 tablet (3.125 mg total) by mouth 2 (two) times daily with a meal. 11/01/16   Clarisa Kindred A, FNP  lisinopril (PRINIVIL,ZESTRIL) 20 MG tablet Take 10 mg by mouth daily.    [provider]  meloxicam (MOBIC) 7.5 MG tablet Take 7.5 mg by mouth daily.    [provider]  Multiple Vitamin (MULTIVITAMIN WITH MINERALS) TABS tablet Take 1 tablet by mouth daily.    [provider]  sucralfate (CARAFATE) 1 g tablet Take 1 g by mouth 4 (four) times daily -  with meals and at bedtime.    [provider]  Tiotropium Bromide-Olodaterol (STIOLTO RESPIMAT) 2.5-2.5 MCG/ACT AERS Inhale  2 puffs into the lungs daily.    [provider]  traZODone (DESYREL) 50 MG tablet Take 50 mg by mouth at bedtime.    [provider]    Allergies Codeine  Family History  Problem Relation Age of Onset  . Diabetes Unknown   . Hypertension Unknown     Social History Social History   Tobacco Use  . Smoking status: Former Smoker    Types: Cigarettes    Last attempt to quit: 06/21/2014    Years since quitting: 3.3  . Smokeless tobacco: Current User     Types: Chew  Substance Use Topics  . Alcohol use: Yes    Alcohol/week: 1.2 oz    Types: 2 Cans of beer per week    Comment: per day  . Drug use: No    Review of Systems Constitutional: No fever/chills Eyes: No visual changes. ENT: No sore throat. Cardiovascular: Chest pain as described above Respiratory: Denies shortness of breath. Gastrointestinal: No abdominal pain.  No nausea, no vomiting.  No diarrhea.  No constipation. Genitourinary: Negative for dysuria. Musculoskeletal: Negative for neck pain.  Negative for back pain. Integumentary: Negative for rash. Neurological: Negative for headaches, focal weakness or numbness.   ____________________________________________   PHYSICAL EXAM:  VITAL SIGNS: ED Triage Vitals  Enc Vitals Group     BP 10/26/17 1826 (!) 145/97     Pulse Rate 10/26/17 1826 82     Resp 10/26/17 1826 20     Temp 10/26/17 1826 97.6 F (36.4 C)     Temp Source 10/26/17 1826 Oral     SpO2 10/26/17 1826 92 %     Weight 10/26/17 1823 34.5 kg (76 lb)     Height 10/26/17 1823 1.651 m (5\' 5" )     Head Circumference --      Peak Flow --      Pain Score 10/26/17 1823 2     Pain Loc --      Pain Edu? --      Excl. in GC? --     Constitutional: Alert and oriented.  No acute distress but has a appearance of chronic illness and is extremely cachectic.  Disheveled. Eyes: Conjunctivae are normal.  Head: Atraumatic. Nose: No congestion/rhinnorhea. Mouth/Throat: Mucous membranes are moist. Neck: No stridor.  No meningeal signs.   Cardiovascular: Normal rate, regular rhythm. Good peripheral circulation. Grossly normal heart sounds. Respiratory: Normal respiratory effort.  No retractions. Lungs CTAB. Gastrointestinal: Soft and nontender. No distention.  Musculoskeletal: No lower extremity tenderness nor edema. No gross deformities of extremities. Neurologic:  Normal speech and language. No gross focal neurologic deficits are appreciated.  Skin:  Skin is warm,  dry and intact. No rash noted. Psychiatric: Mood and affect are normal. Speech and behavior are normal.  ____________________________________________   LABS (all labs ordered are listed, but only abnormal results are displayed)  Labs Reviewed  BASIC METABOLIC PANEL - Abnormal; Notable for the following components:      Result Value   BUN <5 (*)    Creatinine, Ser 0.56 (*)    Calcium 7.7 (*)    All other components within normal limits  CBC - Abnormal; Notable for the following components:   RBC 3.46 (*)    Hemoglobin 11.7 (*)    HCT 34.6 (*)    MCV 100.1 (*)    RDW 17.3 (*)    All other components within normal limits  HEPATIC FUNCTION PANEL - Abnormal; Notable for the following components:  Total Protein 5.2 (*)    Albumin 2.5 (*)    Bilirubin, Direct 0.3 (*)    Indirect Bilirubin 0.1 (*)    All other components within normal limits  ETHANOL - Abnormal; Notable for the following components:   Alcohol, Ethyl (B) 123 (*)    All other components within normal limits  TROPONIN I  LACTIC ACID, PLASMA  LIPASE, BLOOD  TROPONIN I   ____________________________________________  EKG  ED ECG REPORT I, Loleta Rose, the attending physician, personally viewed and interpreted this ECG.  Date: 10/26/2017 EKG Time: 18: 25 Rate: 82 Rhythm: normal sinus rhythm QRS Axis: normal Intervals: normal ST/T Wave abnormalities: normal Narrative Interpretation: no evidence of acute ischemia  ____________________________________________  RADIOLOGY I, Loleta Rose, personally viewed and evaluated these images (plain radiographs) as part of my medical decision making, as well as reviewing the written report by the radiologist.  ED MD interpretation: No acute abnormalities identified on chest x-ray  Official radiology report(s): Dg Chest 2 View  Result Date: 10/26/2017 CLINICAL DATA:  Pt arrived via ems for c/o chest pain - the pain started around 7am and has not gotten better - he  reports shortness of breath but denies N/V - he reports the pain is in the left side of his chest and radiates around left side and into back EXAM: CHEST - 2 VIEW COMPARISON:  Chest CTA and chest radiographs, 04/30/2017. FINDINGS: Cardiac silhouette is normal in size. No mediastinal or hilar masses. No evidence of adenopathy. Hyperexpanded lungs. Prominent bronchovascular markings most evident in the bases. Mild lung base scarring. No evidence of pneumonia or pulmonary edema. No pleural effusion or pneumothorax. Multiple old rib fractures.  Old right proximal humerus fracture. IMPRESSION: 1. No acute cardiopulmonary disease. 2. COPD. Electronically Signed   By: Amie Portland M.D.   On: 10/26/2017 18:59    ____________________________________________   PROCEDURES  Critical Care performed: No   Procedure(s) performed:   Procedures   ____________________________________________   INITIAL IMPRESSION / ASSESSMENT AND PLAN / ED COURSE  As part of my medical decision making, I reviewed the following data within the electronic MEDICAL RECORD NUMBER Nursing notes reviewed and incorporated, Labs reviewed , EKG interpreted , Old chart reviewed, Radiograph reviewed  and Notes from prior ED visits    Differential diagnosis includes, but is not limited to, ACS, aortic dissection, pulmonary embolism, cardiac tamponade, pneumothorax, pneumonia, pericarditis, myocarditis, GI-related causes including esophagitis/gastritis, and musculoskeletal chest wall pain.    However, the patient's EKG is normal with no evidence of ischemia.  His blood pressure is elevated but it is always elevated.  Vital signs are otherwise normal.  The patient has a problem with alcohol abuse and I have added on ethanol level.  It seems that when he has been drinking he tends to report chest pain.  First troponin is normal, labs otherwise unremarkable with a normal lactic acid and an essentially normal metabolic panel and CBC given his  body habitus and chronic cachexia.  I anticipate checking a second troponin discharging him if it is normal.  He understands and agrees with this plan.  Will give full dose aspirin.  Clinical Course as of Oct 27 2318  Wynelle Link Oct 26, 2017  2047 Alcohol, Ethyl (B)(!): 123 [CF]  2318 Second troponin negative.  Patient asymptomatic for 5 hours in the ED.  Will d/c as per previously discussed plan.  Troponin I: <0.03 [CF]    Clinical Course User Index [CF] Loleta Rose, MD  ____________________________________________  FINAL CLINICAL IMPRESSION(S) / ED DIAGNOSES  Final diagnoses:  Chest pain, unspecified type  Alcoholic intoxication with complication (HCC)     MEDICATIONS GIVEN DURING THIS VISIT:  Medications  aspirin chewable tablet 324 mg (has no administration in time range)     ED Discharge Orders    None       Note:  This document was prepared using Dragon voice recognition software and may include unintentional dictation errors.    Loleta Rose, MD 10/26/17 2320

## 2017-10-26 NOTE — Discharge Instructions (Signed)

## 2017-10-27 NOTE — ED Notes (Signed)
Patient left his room without signing dc, and did not take his paperwork.  Per pervious note, he was being dc to the lobby but FN has not seen him.

## 2017-10-28 ENCOUNTER — Encounter (HOSPITAL_COMMUNITY): Payer: Self-pay

## 2017-10-28 ENCOUNTER — Inpatient Hospital Stay (HOSPITAL_COMMUNITY)
Admission: EM | Disposition: A | Discharge status: Skilled Nursing Facility | Payer: Self-pay | Source: Home / Self Care | Attending: Cardiology

## 2017-10-28 ENCOUNTER — Inpatient Hospital Stay (HOSPITAL_COMMUNITY): Payer: Medicaid Other

## 2017-10-28 ENCOUNTER — Other Ambulatory Visit: Payer: Self-pay

## 2017-10-28 ENCOUNTER — Inpatient Hospital Stay (HOSPITAL_COMMUNITY)
Admission: EM | Admit: 2017-10-28 | Discharge: 2017-11-10 | DRG: 280 | Disposition: A | Discharge status: Skilled Nursing Facility | Payer: Medicaid Other | Attending: Cardiology | Admitting: Cardiology

## 2017-10-28 ENCOUNTER — Emergency Department (HOSPITAL_COMMUNITY): Payer: Medicaid Other

## 2017-10-28 DIAGNOSIS — I2510 Atherosclerotic heart disease of native coronary artery without angina pectoris: Secondary | ICD-10-CM | POA: Diagnosis not present

## 2017-10-28 DIAGNOSIS — I2111 ST elevation (STEMI) myocardial infarction involving right coronary artery: Principal | ICD-10-CM | POA: Diagnosis present

## 2017-10-28 DIAGNOSIS — Z885 Allergy status to narcotic agent status: Secondary | ICD-10-CM | POA: Diagnosis not present

## 2017-10-28 DIAGNOSIS — Z7401 Bed confinement status: Secondary | ICD-10-CM | POA: Diagnosis not present

## 2017-10-28 DIAGNOSIS — Z87891 Personal history of nicotine dependence: Secondary | ICD-10-CM | POA: Diagnosis not present

## 2017-10-28 DIAGNOSIS — Z681 Body mass index (BMI) 19 or less, adult: Secondary | ICD-10-CM | POA: Diagnosis not present

## 2017-10-28 DIAGNOSIS — I5023 Acute on chronic systolic (congestive) heart failure: Secondary | ICD-10-CM | POA: Diagnosis present

## 2017-10-28 DIAGNOSIS — Z7189 Other specified counseling: Secondary | ICD-10-CM

## 2017-10-28 DIAGNOSIS — I340 Nonrheumatic mitral (valve) insufficiency: Secondary | ICD-10-CM | POA: Diagnosis not present

## 2017-10-28 DIAGNOSIS — F1020 Alcohol dependence, uncomplicated: Secondary | ICD-10-CM | POA: Diagnosis present

## 2017-10-28 DIAGNOSIS — I2584 Coronary atherosclerosis due to calcified coronary lesion: Secondary | ICD-10-CM | POA: Diagnosis not present

## 2017-10-28 DIAGNOSIS — I110 Hypertensive heart disease with heart failure: Secondary | ICD-10-CM | POA: Diagnosis present

## 2017-10-28 DIAGNOSIS — Z7982 Long term (current) use of aspirin: Secondary | ICD-10-CM | POA: Diagnosis not present

## 2017-10-28 DIAGNOSIS — Z66 Do not resuscitate: Secondary | ICD-10-CM | POA: Diagnosis not present

## 2017-10-28 DIAGNOSIS — I272 Pulmonary hypertension, unspecified: Secondary | ICD-10-CM | POA: Diagnosis present

## 2017-10-28 DIAGNOSIS — F1010 Alcohol abuse, uncomplicated: Secondary | ICD-10-CM | POA: Diagnosis present

## 2017-10-28 DIAGNOSIS — Z008 Encounter for other general examination: Secondary | ICD-10-CM

## 2017-10-28 DIAGNOSIS — I959 Hypotension, unspecified: Secondary | ICD-10-CM | POA: Diagnosis present

## 2017-10-28 DIAGNOSIS — R64 Cachexia: Secondary | ICD-10-CM | POA: Diagnosis present

## 2017-10-28 DIAGNOSIS — J449 Chronic obstructive pulmonary disease, unspecified: Secondary | ICD-10-CM | POA: Diagnosis present

## 2017-10-28 DIAGNOSIS — I25110 Atherosclerotic heart disease of native coronary artery with unstable angina pectoris: Secondary | ICD-10-CM | POA: Diagnosis present

## 2017-10-28 DIAGNOSIS — Z515 Encounter for palliative care: Secondary | ICD-10-CM | POA: Diagnosis present

## 2017-10-28 DIAGNOSIS — I5022 Chronic systolic (congestive) heart failure: Secondary | ICD-10-CM | POA: Diagnosis not present

## 2017-10-28 DIAGNOSIS — I213 ST elevation (STEMI) myocardial infarction of unspecified site: Secondary | ICD-10-CM | POA: Diagnosis present

## 2017-10-28 DIAGNOSIS — R0602 Shortness of breath: Secondary | ICD-10-CM

## 2017-10-28 DIAGNOSIS — Z79899 Other long term (current) drug therapy: Secondary | ICD-10-CM | POA: Diagnosis not present

## 2017-10-28 DIAGNOSIS — D72829 Elevated white blood cell count, unspecified: Secondary | ICD-10-CM | POA: Diagnosis present

## 2017-10-28 DIAGNOSIS — I10 Essential (primary) hypertension: Secondary | ICD-10-CM | POA: Diagnosis present

## 2017-10-28 DIAGNOSIS — F1099 Alcohol use, unspecified with unspecified alcohol-induced disorder: Secondary | ICD-10-CM | POA: Diagnosis not present

## 2017-10-28 DIAGNOSIS — E43 Unspecified severe protein-calorie malnutrition: Secondary | ICD-10-CM | POA: Diagnosis present

## 2017-10-28 DIAGNOSIS — I255 Ischemic cardiomyopathy: Secondary | ICD-10-CM | POA: Diagnosis present

## 2017-10-28 DIAGNOSIS — R531 Weakness: Secondary | ICD-10-CM

## 2017-10-28 HISTORY — PX: LEFT HEART CATH AND CORONARY ANGIOGRAPHY: CATH118249

## 2017-10-28 LAB — BASIC METABOLIC PANEL
ANION GAP: 14 (ref 5–15)
BUN: 9 mg/dL (ref 8–23)
CHLORIDE: 97 mmol/L — AB (ref 98–111)
CO2: 25 mmol/L (ref 22–32)
Calcium: 8.6 mg/dL — ABNORMAL LOW (ref 8.9–10.3)
Creatinine, Ser: 1.1 mg/dL (ref 0.61–1.24)
GFR calc non Af Amer: >60 mL/min (ref >60)
Glucose, Bld: 146 mg/dL — ABNORMAL HIGH (ref 70–99)
Potassium: 4.8 mmol/L (ref 3.5–5.1)
SODIUM: 136 mmol/L (ref 135–145)

## 2017-10-28 LAB — CBC
HCT: 38.5 % — ABNORMAL LOW (ref 39.0–52.0)
HEMOGLOBIN: 12.1 g/dL — AB (ref 13.0–17.0)
MCH: 32.4 pg (ref 26.0–34.0)
MCHC: 31.4 g/dL (ref 30.0–36.0)
MCV: 103.2 fL — ABNORMAL HIGH (ref 78.0–100.0)
Platelets: 258 10*3/uL (ref 150–400)
RBC: 3.73 MIL/uL — AB (ref 4.22–5.81)
RDW: 16 % — ABNORMAL HIGH (ref 11.5–15.5)
WBC: 21.9 10*3/uL — AB (ref 4.0–10.5)

## 2017-10-28 LAB — URINALYSIS, ROUTINE W REFLEX MICROSCOPIC
Bacteria, UA: NONE SEEN
Bilirubin Urine: NEGATIVE
Glucose, UA: NEGATIVE mg/dL
Ketones, ur: 5 mg/dL — AB
Leukocytes, UA: NEGATIVE
Nitrite: NEGATIVE
PROTEIN: NEGATIVE mg/dL
SPECIFIC GRAVITY, URINE: 1.04 — AB (ref 1.005–1.030)
pH: 6 (ref 5.0–8.0)

## 2017-10-28 LAB — ECHOCARDIOGRAM COMPLETE
Height: 65 in
Weight: 1379.2 oz

## 2017-10-28 LAB — ETHANOL

## 2017-10-28 LAB — POCT ACTIVATED CLOTTING TIME: Activated Clotting Time: 153 seconds

## 2017-10-28 LAB — HEPATIC FUNCTION PANEL
ALT: 24 U/L (ref 0–44)
AST: 177 U/L — ABNORMAL HIGH (ref 15–41)
Albumin: 2.9 g/dL — ABNORMAL LOW (ref 3.5–5.0)
Alkaline Phosphatase: 49 U/L (ref 38–126)
BILIRUBIN INDIRECT: 0.8 mg/dL (ref 0.3–0.9)
Bilirubin, Direct: 0.2 mg/dL (ref 0.0–0.2)
TOTAL PROTEIN: 5.8 g/dL — AB (ref 6.5–8.1)
Total Bilirubin: 1 mg/dL (ref 0.3–1.2)

## 2017-10-28 LAB — I-STAT TROPONIN, ED: TROPONIN I, POC: 0.81 ng/mL — AB (ref 0.00–0.08)

## 2017-10-28 LAB — MRSA PCR SCREENING: MRSA by PCR: NEGATIVE

## 2017-10-28 SURGERY — LEFT HEART CATH AND CORONARY ANGIOGRAPHY
Anesthesia: LOCAL

## 2017-10-28 MED ORDER — HEPARIN SODIUM (PORCINE) 1000 UNIT/ML IJ SOLN
INTRAMUSCULAR | Status: AC
  Filled 2017-10-28: qty 1

## 2017-10-28 MED ORDER — NITROGLYCERIN 1 MG/10 ML FOR IR/CATH LAB
INTRA_ARTERIAL | Status: AC
  Filled 2017-10-28: qty 10

## 2017-10-28 MED ORDER — FUROSEMIDE 10 MG/ML IJ SOLN
INTRAMUSCULAR | Status: DC | PRN
  Administered 2017-10-28: 20 mg via INTRAVENOUS

## 2017-10-28 MED ORDER — LORAZEPAM 2 MG/ML IJ SOLN: 1.0000 mg | Freq: Four times a day (QID) | INTRAMUSCULAR | Status: AC | PRN

## 2017-10-28 MED ORDER — IOPAMIDOL (ISOVUE-370) INJECTION 76%
INTRAVENOUS | Status: AC
  Filled 2017-10-28: qty 125

## 2017-10-28 MED ORDER — ORAL CARE MOUTH RINSE
15.0000 mL | Freq: Two times a day (BID) | OROMUCOSAL | Status: DC
  Administered 2017-10-28 – 2017-11-10 (×19): 15 mL via OROMUCOSAL

## 2017-10-28 MED ORDER — ACETAMINOPHEN 325 MG PO TABS
650.0000 mg | ORAL_TABLET | ORAL | Status: DC | PRN
  Administered 2017-10-29 – 2017-11-01 (×3): 650 mg via ORAL
  Filled 2017-10-28 (×3): qty 2

## 2017-10-28 MED ORDER — ALBUTEROL SULFATE HFA 108 (90 BASE) MCG/ACT IN AERS: 2.0000 | INHALATION_SPRAY | Freq: Two times a day (BID) | RESPIRATORY_TRACT | Status: DC

## 2017-10-28 MED ORDER — ONDANSETRON HCL 4 MG/2ML IJ SOLN: 4.0000 mg | Freq: Four times a day (QID) | INTRAMUSCULAR | Status: DC | PRN

## 2017-10-28 MED ORDER — ADULT MULTIVITAMIN W/MINERALS CH
1.0000 | ORAL_TABLET | Freq: Every day | ORAL | Status: DC
Start: 2017-10-29 — End: 2017-11-10
  Administered 2017-10-29 – 2017-11-10 (×13): 1 via ORAL
  Filled 2017-10-28 (×14): qty 1

## 2017-10-28 MED ORDER — LORAZEPAM 1 MG PO TABS
1.0000 mg | ORAL_TABLET | Freq: Four times a day (QID) | ORAL | Status: AC | PRN
  Administered 2017-10-28: 1 mg via ORAL
  Filled 2017-10-28: qty 1

## 2017-10-28 MED ORDER — ADULT MULTIVITAMIN W/MINERALS CH
1.0000 | ORAL_TABLET | Freq: Every day | ORAL | Status: DC
  Administered 2017-10-28: 1 via ORAL

## 2017-10-28 MED ORDER — FOLIC ACID 1 MG PO TABS
1.0000 mg | ORAL_TABLET | Freq: Every day | ORAL | Status: DC
  Administered 2017-10-28 – 2017-11-10 (×14): 1 mg via ORAL
  Filled 2017-10-28 (×14): qty 1

## 2017-10-28 MED ORDER — THIAMINE HCL 100 MG/ML IJ SOLN
100.0000 mg | Freq: Every day | INTRAMUSCULAR | Status: DC
  Administered 2017-10-29: 100 mg via INTRAVENOUS
  Filled 2017-10-28 (×3): qty 2

## 2017-10-28 MED ORDER — LIDOCAINE HCL (PF) 1 % IJ SOLN
INTRAMUSCULAR | Status: AC
  Filled 2017-10-28: qty 30

## 2017-10-28 MED ORDER — ASPIRIN 325 MG PO TABS
325.0000 mg | ORAL_TABLET | Freq: Once | ORAL | Status: AC
  Administered 2017-10-28: 325 mg via ORAL

## 2017-10-28 MED ORDER — LISINOPRIL 10 MG PO TABS
10.0000 mg | ORAL_TABLET | Freq: Every day | ORAL | Status: DC
  Administered 2017-10-29: 10 mg via ORAL
  Filled 2017-10-28: qty 1

## 2017-10-28 MED ORDER — HEPARIN SODIUM (PORCINE) 5000 UNIT/ML IJ SOLN
5000.0000 [IU] | Freq: Three times a day (TID) | INTRAMUSCULAR | Status: DC
  Administered 2017-10-28 – 2017-11-09 (×33): 5000 [IU] via SUBCUTANEOUS
  Filled 2017-10-28 (×35): qty 1

## 2017-10-28 MED ORDER — HEPARIN SODIUM (PORCINE) 5000 UNIT/ML IJ SOLN
INTRAMUSCULAR | Status: AC
  Filled 2017-10-28: qty 1

## 2017-10-28 MED ORDER — SODIUM CHLORIDE 0.9 % IV BOLUS
1000.0000 mL | Freq: Once | INTRAVENOUS | Status: AC
Start: 2017-10-28 — End: 2017-10-28
  Administered 2017-10-28: 1000 mL via INTRAVENOUS

## 2017-10-28 MED ORDER — TIOTROPIUM BROMIDE MONOHYDRATE 18 MCG IN CAPS
18.0000 ug | ORAL_CAPSULE | Freq: Every day | RESPIRATORY_TRACT | Status: DC
  Administered 2017-10-29 – 2017-11-10 (×13): 18 ug via RESPIRATORY_TRACT
  Filled 2017-10-28 (×3): qty 5

## 2017-10-28 MED ORDER — HEPARIN SODIUM (PORCINE) 5000 UNIT/ML IJ SOLN
2000.0000 [IU] | Freq: Once | INTRAMUSCULAR | Status: AC
  Administered 2017-10-28: 2000 [IU] via INTRAVENOUS

## 2017-10-28 MED ORDER — ONDANSETRON HCL 4 MG/2ML IJ SOLN
INTRAMUSCULAR | Status: AC
  Filled 2017-10-28: qty 2

## 2017-10-28 MED ORDER — GUAIFENESIN 100 MG/5ML PO SOLN
5.0000 mL | ORAL | Status: DC | PRN
Start: 2017-10-28 — End: 2017-11-10
  Administered 2017-10-29 – 2017-11-08 (×7): 100 mg via ORAL
  Filled 2017-10-28: qty 25
  Filled 2017-10-28: qty 5
  Filled 2017-10-28: qty 25
  Filled 2017-10-28 (×3): qty 5
  Filled 2017-10-28: qty 25

## 2017-10-28 MED ORDER — SUCRALFATE 1 G PO TABS
1.0000 g | ORAL_TABLET | Freq: Three times a day (TID) | ORAL | Status: DC
  Administered 2017-10-28 – 2017-11-10 (×52): 1 g via ORAL
  Filled 2017-10-28 (×53): qty 1

## 2017-10-28 MED ORDER — FUROSEMIDE 10 MG/ML IJ SOLN
INTRAMUSCULAR | Status: AC
  Filled 2017-10-28: qty 4

## 2017-10-28 MED ORDER — SODIUM CHLORIDE 0.9% FLUSH
3.0000 mL | Freq: Two times a day (BID) | INTRAVENOUS | Status: DC
  Administered 2017-10-28 – 2017-11-04 (×13): 3 mL via INTRAVENOUS

## 2017-10-28 MED ORDER — VERAPAMIL HCL 2.5 MG/ML IV SOLN
INTRAVENOUS | Status: AC
  Filled 2017-10-28: qty 2

## 2017-10-28 MED ORDER — PERFLUTREN LIPID MICROSPHERE
INTRAVENOUS | Status: AC
  Administered 2017-10-28: 22:00:00
  Filled 2017-10-28: qty 10

## 2017-10-28 MED ORDER — LIDOCAINE HCL (PF) 1 % IJ SOLN
INTRAMUSCULAR | Status: DC | PRN
  Administered 2017-10-28: 15 mL

## 2017-10-28 MED ORDER — ASPIRIN 81 MG PO CHEW
81.0000 mg | CHEWABLE_TABLET | Freq: Every day | ORAL | Status: DC
  Administered 2017-10-29 – 2017-11-10 (×13): 81 mg via ORAL
  Filled 2017-10-28 (×13): qty 1

## 2017-10-28 MED ORDER — SODIUM CHLORIDE 0.9 % IV SOLN: 250.0000 mL | INTRAVENOUS | Status: DC | PRN

## 2017-10-28 MED ORDER — SODIUM CHLORIDE 0.9% FLUSH: 3.0000 mL | INTRAVENOUS | Status: DC | PRN

## 2017-10-28 MED ORDER — SODIUM CHLORIDE 0.9 % WEIGHT BASED INFUSION: 1.0000 mL/kg/h | INTRAVENOUS | Status: AC

## 2017-10-28 MED ORDER — PERFLUTREN LIPID MICROSPHERE
1.0000 mL | INTRAVENOUS | Status: AC | PRN
  Filled 2017-10-28: qty 10

## 2017-10-28 MED ORDER — IOPAMIDOL (ISOVUE-370) INJECTION 76%
INTRAVENOUS | Status: DC | PRN
  Administered 2017-10-28: 85 mL via INTRA_ARTERIAL

## 2017-10-28 MED ORDER — ONDANSETRON HCL 4 MG/2ML IJ SOLN
4.0000 mg | Freq: Once | INTRAMUSCULAR | Status: AC
  Administered 2017-10-28: 4 mg via INTRAVENOUS

## 2017-10-28 MED ORDER — CARVEDILOL 3.125 MG PO TABS
3.1250 mg | ORAL_TABLET | Freq: Two times a day (BID) | ORAL | Status: DC
  Administered 2017-10-28 – 2017-10-31 (×5): 3.125 mg via ORAL
  Filled 2017-10-28 (×5): qty 1

## 2017-10-28 MED ORDER — CLOPIDOGREL BISULFATE 75 MG PO TABS
75.0000 mg | ORAL_TABLET | Freq: Every day | ORAL | Status: DC
  Administered 2017-10-29 – 2017-11-10 (×13): 75 mg via ORAL
  Filled 2017-10-28 (×13): qty 1

## 2017-10-28 MED ORDER — TRAZODONE HCL 50 MG PO TABS
50.0000 mg | ORAL_TABLET | Freq: Every day | ORAL | Status: DC
  Administered 2017-10-28 – 2017-11-09 (×13): 50 mg via ORAL
  Filled 2017-10-28 (×13): qty 1

## 2017-10-28 MED ORDER — ALBUTEROL SULFATE (2.5 MG/3ML) 0.083% IN NEBU
2.5000 mg | INHALATION_SOLUTION | RESPIRATORY_TRACT | Status: DC | PRN
  Administered 2017-10-28 – 2017-11-10 (×6): 2.5 mg via RESPIRATORY_TRACT
  Filled 2017-10-28 (×7): qty 3

## 2017-10-28 MED ORDER — VITAMIN B-1 100 MG PO TABS
100.0000 mg | ORAL_TABLET | Freq: Every day | ORAL | Status: DC
Start: 2017-10-28 — End: 2017-11-10
  Administered 2017-10-28 – 2017-11-10 (×13): 100 mg via ORAL
  Filled 2017-10-28 (×13): qty 1

## 2017-10-28 MED ORDER — HEPARIN (PORCINE) IN NACL 1000-0.9 UT/500ML-% IV SOLN
INTRAVENOUS | Status: AC
  Filled 2017-10-28: qty 1000

## 2017-10-28 SURGICAL SUPPLY — 13 items
CATH INFINITI 5FR MULTPACK ANG (CATHETERS) ×2 IMPLANT
CATH VISTA GUIDE 6FR JR4 (CATHETERS) ×2 IMPLANT
GLIDESHEATH SLEND SS 6F .021 (SHEATH) ×2 IMPLANT
GUIDEWIRE INQWIRE 1.5J.035X260 (WIRE) ×1 IMPLANT
INQWIRE 1.5J .035X260CM (WIRE) ×2
KIT ENCORE 26 ADVANTAGE (KITS) ×2 IMPLANT
KIT HEART LEFT (KITS) ×2 IMPLANT
PACK CARDIAC CATHETERIZATION (CUSTOM PROCEDURE TRAY) ×2 IMPLANT
SHEATH PINNACLE 6F 10CM (SHEATH) ×2 IMPLANT
SYR MEDRAD MARK V 150ML (SYRINGE) ×2 IMPLANT
TRANSDUCER W/STOPCOCK (MISCELLANEOUS) ×2 IMPLANT
TUBING CIL FLEX 10 FLL-RA (TUBING) ×2 IMPLANT
WIRE EMERALD 3MM-J .035X150CM (WIRE) ×2 IMPLANT

## 2017-10-28 NOTE — H&P (Addendum)
History & Physical    Patient ID: MCALLISTER DALESIO MRN: [id number], DOB/AGE: October 25, 1955   Admit date: 10/28/2017   Primary Physician: Evelene Croon, MD Primary Cardiologist: New  Patient Profile    62 yo male with PMH of HTN, COPD, asthma, chewing tobacco use, ETOH abuse and chronic diastolic HF who presented with chest pain and Code STEMI called.   Past Medical History   Past Medical History:  Diagnosis Date  . Asthma   . COPD (chronic obstructive pulmonary disease) (HCC)   . Hypertension     Past Surgical History:  Procedure Laterality Date  . CATARACT EXTRACTION, BILATERAL Bilateral   . ESOPHAGOGASTRODUODENOSCOPY (EGD) WITH PROPOFOL N/A 06/21/2015   Procedure: ESOPHAGOGASTRODUODENOSCOPY (EGD) WITH PROPOFOL;  Surgeon: Midge Minium, MD;  Location: [facility] ENDOSCOPY;  Service: Endoscopy;  Laterality: N/A;     Allergies  Allergies  Allergen Reactions  . Codeine Other (See Comments)    Nasal drip    History of Present Illness    Mr. Berrian is a 62 yo male with PMH of HTN, COPD, asthma, chewing tobacco use, ETOH abuse and chronic diastolic HF. He is followed by his PCP for chronic illnesses. Had an echo done back in 6/18 that showed an EF of 30-35% with mild MR and moderate TR with severely elevated PA pressures of . He has had multiple trips to the ED in review of notes. Has been on BB and ACEi as an outpatient. Drinks heavily. Was at [facility] on 7/14 with complaints of chest pain, cough and ETOH use. EKG was nonischemic and troponins negative. Was discharged home.   This morning developed worsening centralized chest pain, back pain and cough. Friend called EMS. On EMS arrival his sats were 90% on RA. He was transported to [facility] for further evaluation. Sent to triage on arrival to [facility], there EKG was done in triage and showed acute ST elevation in inferior and anterior leads. Code STEMI was called. Given 324mg  ASA and 2000 units of heparin. Brought to the cath lab for emergent  cardiac cath.   Home Medications    Prior to Admission medications   Medication Sig Start Date End Date Taking? Authorizing Provider  albuterol (PROVENTIL HFA;VENTOLIN HFA) 108 (90 Base) MCG/ACT inhaler Inhale 2 puffs into the lungs 2 (two) times daily.    [provider]  aspirin 81 MG chewable tablet Chew 1 tablet (81 mg total) by mouth daily. 10/13/16   Adrian Saran, MD  carvedilol (COREG) 3.125 MG tablet Take 1 tablet (3.125 mg total) by mouth 2 (two) times daily with a meal. 11/01/16   Clarisa Kindred A, FNP  lisinopril (PRINIVIL,ZESTRIL) 20 MG tablet Take 10 mg by mouth daily.    [provider]  meloxicam (MOBIC) 7.5 MG tablet Take 7.5 mg by mouth daily.    [provider]  Multiple Vitamin (MULTIVITAMIN WITH MINERALS) TABS tablet Take 1 tablet by mouth daily.    [provider]  sucralfate (CARAFATE) 1 g tablet Take 1 g by mouth 4 (four) times daily -  with meals and at bedtime.    [provider]  Tiotropium Bromide-Olodaterol (STIOLTO RESPIMAT) 2.5-2.5 MCG/ACT AERS Inhale 2 puffs into the lungs daily.    [provider]  traZODone (DESYREL) 50 MG tablet Take 50 mg by mouth at bedtime.    [provider]    Family History    Family History  Problem Relation Age of Onset  . Diabetes Unknown   . Hypertension Unknown  Social History    Social History   Socioeconomic History  . Marital status: Legally Separated    Spouse name: Not on file  . Number of children: Not on file  . Years of education: 6  . Highest education level: 6th grade  Occupational History  . Occupation: retired  Engineer, production  . Financial resource strain: Very hard  . Food insecurity:    Worry: Often true    Inability: Often true  . Transportation needs:    Medical: No    Non-medical: No  Tobacco Use  . Smoking status: Former Smoker    Types: Cigarettes    Last attempt to quit: 06/21/2014    Years since quitting: 3.3  . Smokeless tobacco:  Current User    Types: Chew  Substance and Sexual Activity  . Alcohol use: Yes    Alcohol/week: 1.2 oz    Types: 2 Cans of beer per week    Comment: per day  . Drug use: No  . Sexual activity: Never  Lifestyle  . Physical activity:    Days per week: Not on file    Minutes per session: Not on file  . Stress: Not on file  Relationships  . Social connections:    Talks on phone: Not on file    Gets together: Not on file    Attends religious service: Not on file    Active member of club or organization: Not on file    Attends meetings of clubs or organizations: Not on file    Relationship status: Not on file  . Intimate partner violence:    Fear of current or ex partner: Not on file    Emotionally abused: Not on file    Physically abused: Not on file    Forced sexual activity: Not on file  Other Topics Concern  . Not on file  Social History Narrative  . Not on file     Review of Systems    See HPI  All other systems reviewed and are otherwise negative except as noted above.  Physical Exam    Blood pressure 116/86, pulse (!) 117, temperature 98 F (36.7 C), temperature source Oral, resp. rate (!) 23, SpO2 100 %.  General: Very thin WM, ill appearing older than stated age, NAD Psych: Normal affect. Neuro: Alert and oriented X 3. Moves all extremities spontaneously. HEENT: Normal  Neck: Supple, no JVD. Lungs:  Resp regular and unlabored, CTA. Heart: RRR no s3, s4, soft systolic murmurs. Abdomen: Soft, non-tender, non-distended, BS + x 4.  Extremities: No clubbing, cyanosis or edema. DP/PT/Radials 2+ and equal bilaterally.  Labs    Troponin [facility] of Care Test) Recent Labs    10/28/17 1106  TROPIPOC 0.81*   Recent Labs    10/26/17 1827 10/26/17 2106  TROPONINI <0.03 <0.03   Lab Results  Component Value Date   WBC 21.9 (H) 10/28/2017   HGB 12.1 (L) 10/28/2017   HCT 38.5 (L) 10/28/2017   MCV 103.2 (H) 10/28/2017   PLT 258 10/28/2017    Recent Labs  Lab  10/28/17 1052  NA 136  K 4.8  CL 97*  CO2 25  BUN 9  CREATININE 1.10  CALCIUM 8.6*  PROT 5.8*  BILITOT 1.0  ALKPHOS 49  ALT 24  AST 177*  GLUCOSE 146*   Lab Results  Component Value Date   CHOL 114 10/11/2016   HDL 37 (L) 10/11/2016   LDLCALC 65 10/11/2016   TRIG 58 10/11/2016  No results found for: [facility]   Radiology Studies    Dg Chest 2 View  Result Date: 10/26/2017 CLINICAL DATA:  Pt arrived via ems for c/o chest pain - the pain started around 7am and has not gotten better - he reports shortness of breath but denies N/V - he reports the pain is in the left side of his chest and radiates around left side and into back EXAM: CHEST - 2 VIEW COMPARISON:  Chest CTA and chest radiographs, 04/30/2017. FINDINGS: Cardiac silhouette is normal in size. No mediastinal or hilar masses. No evidence of adenopathy. Hyperexpanded lungs. Prominent bronchovascular markings most evident in the bases. Mild lung base scarring. No evidence of pneumonia or pulmonary edema. No pleural effusion or pneumothorax. Multiple old rib fractures.  Old right proximal humerus fracture. IMPRESSION: 1. No acute cardiopulmonary disease. 2. COPD. Electronically Signed   By: Amie Portland M.D.   On: 10/26/2017 18:59   Dg Chest Port 1 View  Result Date: 10/28/2017 CLINICAL DATA:  STEMI. Dry cough. Shortness of breath. Chest pain. EXAM: PORTABLE CHEST 1 VIEW COMPARISON:  10/26/2017, 04/30/2017.  CT 04/30/2017. FINDINGS: Mediastinum and hilar structures are normal. COPD. Bilateral pleural-parenchymal thickening again noted consistent with scarring. Reference is made to recent CT report of 04/30/2017 for discussion of pulmonary nodules noted. Tiny left pleural effusion. No pneumothorax. Borderline cardiomegaly with mild pulmonary venous congestion. Carotid vascular and bilateral upper extremity peripheral vascular calcification. Old right non healed femoral neck fracture. IMPRESSION: 1. COPD and pleuroparenchymal scarring.  Small left pleural effusion cannot be excluded. 2.  Cardiomegaly with mild pulmonary venous congestion. 3. Carotid and bilateral upper extremity peripheral vascular disease. Electronically Signed   By: Maisie Fus  Register   On: 10/28/2017 11:22    ECG & Cardiac Imaging    EKG: SR with acute ST elevation in the inferior and anterior leads.   Assessment & Plan    62 yo male with PMH of HTN, COPD, asthma, chewing tobacco use, ETOH abuse and chronic diastolic HF who presented with chest pain and Code STEMI called.  1. STEMI: EKG with ST elevation in inferior and anterior leads. Given 324mg  ASA and 2000 units of heparin in the ED. Brought to the cath lab for emergent cardiac cath. Further recommendations to follow.   2. Chronic diastolic HF: Has been on BB and ACEi as an outpatient. Last echo 6/18 with EF of 30-35%. No signs of significant volume overload on admission.   3. ETOH abuse: His is very thin, and cachetic. Only weighing 75lbs. Given his commodities overall prognosis in poor. Appears he has been counseled by his PCP regarding this at last visit.  -- will order CIWA protocol     4. COPD/Asthma: continue home inhalers.   Severity of Illness: The appropriate patient status for this patient is INPATIENT. Inpatient status is judged to be reasonable and necessary in order to provide the required intensity of service to ensure the patient's safety. The patient's presenting symptoms, physical exam findings, and initial radiographic and laboratory data in the context of their chronic comorbidities is felt to place them at high risk for further clinical deterioration. Furthermore, it is not anticipated that the patient will be medically stable for discharge from the hospital within 2 midnights of admission. The following factors support the patient status of inpatient.   " The patient's presenting symptoms include chest pain. " The worrisome physical exam findings include cachetic, disheveled. " The  initial radiographic and laboratory data are worrisome because of  EKG with acute ST elevation in inferior and anterior leads. " The chronic co-morbidities include COPD, HTN, ETOH use.   * I certify that at the point of admission it is my clinical judgment that the patient will require inpatient hospital care spanning beyond 2 midnights from the point of admission due to high intensity of service, high risk for further deterioration and high frequency of surveillance required.*     Signed, Laverda Page, NP-C Pager [phone] 10/28/2017, 11:53 AM

## 2017-10-28 NOTE — Progress Notes (Signed)
  Echocardiogram 2D Echocardiogram has been performed.  Caleb Carpenter 10/28/2017, 10:13 PM

## 2017-10-28 NOTE — ED Notes (Signed)
Caleb Carpenter with Cath lab team arrived to room, states they are preparing a room.

## 2017-10-28 NOTE — ED Notes (Signed)
Pt arrives to room repeat EKG done and stemi out.

## 2017-10-28 NOTE — ED Provider Notes (Signed)
[facility] EMERGENCY DEPARTMENT Provider Note   CSN: [id number] Arrival date & time: 10/28/17  1035     History   Chief Complaint Chief Complaint  Patient presents with  . Chest Pain  . Cough    HPI Caleb Carpenter is a 62 y.o. male hx of COPD, HTN, here with chest pain. Patient was seen at [facility] 2 days ago for chest pain, alcohol intoxication. Had two negative troponins and was diagnosed with alcohol intoxication, nonspecific chest pain. This morning, he had sudden onset of worsening chest pain that is substernal. Associated with some diaphoresis. Also has some cough and shortness of breath as well. Came by EMS and was thought to have COPD exacerbation so no EKG were performed. EKG in triage showed STEMI. Code STEMI called at 11:04 am. Patient states that he has hx of MI.     The history is provided by the patient.    Past Medical History:  Diagnosis Date  . Asthma   . COPD (chronic obstructive pulmonary disease) (HCC)   . Hypertension     Patient Active Problem List   Diagnosis Date Noted  . Syncope 04/01/2017  . Pressure injury of skin 04/01/2017  . Fall 02/18/2017  . Hypokalemia 11/06/2016  . Dementia 11/06/2016  . Chronic systolic heart failure (HCC) 11/01/2016  . Dehydration 08/28/2016  . General weakness 09/03/2015  . Generalized weakness 09/02/2015  . Hyponatremia 09/02/2015  . HTN (hypertension) 09/02/2015  . GERD (gastroesophageal reflux disease) 09/02/2015  . Hypomagnesemia 06/23/2015  . Leukocytosis 06/23/2015  . Alcohol abuse 06/23/2015  . Thrombocytopenia (HCC) 06/23/2015  . Reflux esophagitis   . Gastritis     Past Surgical History:  Procedure Laterality Date  . CATARACT EXTRACTION, BILATERAL Bilateral   . ESOPHAGOGASTRODUODENOSCOPY (EGD) WITH PROPOFOL N/A 06/21/2015   Procedure: ESOPHAGOGASTRODUODENOSCOPY (EGD) WITH PROPOFOL;  Surgeon: Midge Minium, MD;  Location: [facility] ENDOSCOPY;  Service: Endoscopy;  Laterality: N/A;         Home Medications    Prior to Admission medications   Medication Sig Start Date End Date Taking? Authorizing Provider  albuterol (PROVENTIL HFA;VENTOLIN HFA) 108 (90 Base) MCG/ACT inhaler Inhale 2 puffs into the lungs 2 (two) times daily.    [provider]  aspirin 81 MG chewable tablet Chew 1 tablet (81 mg total) by mouth daily. 10/13/16   Adrian Saran, MD  carvedilol (COREG) 3.125 MG tablet Take 1 tablet (3.125 mg total) by mouth 2 (two) times daily with a meal. 11/01/16   Clarisa Kindred A, FNP  lisinopril (PRINIVIL,ZESTRIL) 20 MG tablet Take 10 mg by mouth daily.    [provider]  meloxicam (MOBIC) 7.5 MG tablet Take 7.5 mg by mouth daily.    [provider]  Multiple Vitamin (MULTIVITAMIN WITH MINERALS) TABS tablet Take 1 tablet by mouth daily.    [provider]  sucralfate (CARAFATE) 1 g tablet Take 1 g by mouth 4 (four) times daily -  with meals and at bedtime.    [provider]  Tiotropium Bromide-Olodaterol (STIOLTO RESPIMAT) 2.5-2.5 MCG/ACT AERS Inhale 2 puffs into the lungs daily.    [provider]  traZODone (DESYREL) 50 MG tablet Take 50 mg by mouth at bedtime.    [provider]    Family History Family History  Problem Relation Age of Onset  . Diabetes Unknown   . Hypertension Unknown     Social History Social History   Tobacco Use  . Smoking status: Former Smoker  Types: Cigarettes    Last attempt to quit: 06/21/2014    Years since quitting: 3.3  . Smokeless tobacco: Current User    Types: Chew  Substance Use Topics  . Alcohol use: Yes    Alcohol/week: 1.2 oz    Types: 2 Cans of beer per week    Comment: per day  . Drug use: No     Allergies   Codeine   Review of Systems Review of Systems  Respiratory: Positive for cough.   Cardiovascular: Positive for chest pain.  All other systems reviewed and are negative.    Physical Exam Updated Vital Signs BP 116/86   Pulse (!) 112    Temp 98 F (36.7 C) (Oral)   Resp (!) 22   SpO2 96%   Physical Exam  Constitutional:  Chronically ill, disheveled   HENT:  Head: Normocephalic.  Eyes: Pupils are equal, round, and reactive to light.  Neck: Normal range of motion.  Cardiovascular: Normal rate, regular rhythm and normal pulses.  Pulmonary/Chest: Effort normal.  Slightly tachypneic, no wheezing   Abdominal: Soft. Bowel sounds are normal.  Musculoskeletal: Normal range of motion.       Right lower leg: Normal.  Neurological: He is alert.  Skin: Skin is warm.  Nursing note and vitals reviewed.    ED Treatments / Results  Labs (all labs ordered are listed, but only abnormal results are displayed) Labs Reviewed  CBC - Abnormal; Notable for the following components:      Result Value   WBC 21.9 (*)    RBC 3.73 (*)    Hemoglobin 12.1 (*)    HCT 38.5 (*)    MCV 103.2 (*)    RDW 16.0 (*)    All other components within normal limits  I-STAT TROPONIN, ED - Abnormal; Notable for the following components:   Troponin i, poc 0.81 (*)    All other components within normal limits  BASIC METABOLIC PANEL  ETHANOL  HEPATIC FUNCTION PANEL    EKG EKG Interpretation  Date/Time:  Tuesday October 28 2017 10:52:19 EDT Ventricular Rate:  118 PR Interval:  162 QRS Duration: 80 QT Interval:  368 QTC Calculation: 515 R Axis:   78 Text Interpretation:   Critical Test Result: STEMI Sinus tachycardia Low voltage QRS Anteroseptal infarct , possibly acute Inferior injury pattern ACUTE MI / STEMI  Abnormal ECG STEI new since previous Confirmed by Richardean Canal [phone]) on 10/28/2017 11:03:38 AM    EKG Interpretation  Date/Time:  Tuesday October 28 2017 10:59:42 EDT Ventricular Rate:  116 PR Interval:  162 QRS Duration: 81 QT Interval:  377 QTC Calculation: 524 R Axis:   82 Text Interpretation:  Sinus tachycardia Prolonged PR interval Inferior infarct, acute (LCx) Probable anterior infarct, age indeterminate Prolonged QT  interval STEMI unchanged since earlier in the day  Confirmed by Richardean Canal [id number]) on 10/28/2017 11:19:02 AM         Radiology Dg Chest 2 View  Result Date: 10/26/2017 CLINICAL DATA:  Pt arrived via ems for c/o chest pain - the pain started around 7am and has not gotten better - he reports shortness of breath but denies N/V - he reports the pain is in the left side of his chest and radiates around left side and into back EXAM: CHEST - 2 VIEW COMPARISON:  Chest CTA and chest radiographs, 04/30/2017. FINDINGS: Cardiac silhouette is normal in size. No mediastinal or hilar masses. No evidence of adenopathy. Hyperexpanded lungs. Prominent bronchovascular  markings most evident in the bases. Mild lung base scarring. No evidence of pneumonia or pulmonary edema. No pleural effusion or pneumothorax. Multiple old rib fractures.  Old right proximal humerus fracture. IMPRESSION: 1. No acute cardiopulmonary disease. 2. COPD. Electronically Signed   By: Amie Portland M.D.   On: 10/26/2017 18:59    Procedures Procedures (including critical care time)  CRITICAL CARE Performed by: Richardean Canal   Total critical care time: 30 minutes  Critical care time was exclusive of separately billable procedures and treating other patients.  Critical care was necessary to treat or prevent imminent or life-threatening deterioration.  Critical care was time spent personally by me on the following activities: development of treatment plan with patient and/or surrogate as well as nursing, discussions with consultants, evaluation of patient's response to treatment, examination of patient, obtaining history from patient or surrogate, ordering and performing treatments and interventions, ordering and review of laboratory studies, ordering and review of radiographic studies, pulse oximetry and re-evaluation of patient's condition.   Medications Ordered in ED Medications  sodium chloride 0.9 % bolus 1,000 mL (1,000 mLs  Intravenous New Bag/Given 10/28/17 1112)  heparin 5000 UNIT/ML injection (has no administration in time range)  ondansetron (ZOFRAN) 4 MG/2ML injection (has no administration in time range)  aspirin tablet 325 mg (325 mg Oral Given 10/28/17 1112)  ondansetron (ZOFRAN) injection 4 mg (4 mg Intravenous Given 10/28/17 1121)  heparin injection 2,000 Units (2,000 Units Intravenous Given 10/28/17 1113)     Initial Impression / Assessment and Plan / ED Course  I have reviewed the triage vital signs and the nursing notes.  Pertinent labs & imaging results that were available during my care of the patient were reviewed by me and considered in my medical decision making (see chart for details).     Caleb Carpenter is a 62 y.o. male here with chest pain. There seemed to be inferior and lateral STEMI on EKG. This is different than previous EKG 2 days ago. His BP is upper 90s and low 100s. Given IVF. Will hold nitro given the hypotension. Given ASA, heparin bolus. STEMI called at 11:04 am.   11:22 AM Cath lab at bedside. Trop 0.8 initially. CXR unremarkable. Patient taken emergently to cath for possible inferior lateral STEMI.   Final Clinical Impressions(s) / ED Diagnoses   Final diagnoses:  ST elevation myocardial infarction (STEMI), unspecified artery [facility])    ED Discharge Orders    None       Charlynne Pander, MD 10/28/17 1122

## 2017-10-28 NOTE — ED Triage Notes (Signed)
[facility]- Pt was seen at [facility] 3 days ago for cough that has not improved. Pt was initially 90% on room air on FD arrival. Pt reports some back pain for coughing. Pt used inhaler at home PTA.

## 2017-10-28 NOTE — Progress Notes (Addendum)
Site area: rt fa sheath pulled and pressure held by Rich Fuchs Site Prior to Removal:  Level 0 Pressure Applied For: 20 minutes Manual:   yes Patient Status During Pull:  stable Post Pull Site:  Level 0 Post Pull Instructions Given:  yes Post Pull Pulses Present: rt dp 2+ Dressing Applied:  Gauze and tegaderm Bedrest begins @ 1315 Comments:

## 2017-10-29 ENCOUNTER — Other Ambulatory Visit (HOSPITAL_COMMUNITY): Payer: Medicaid Other

## 2017-10-29 ENCOUNTER — Inpatient Hospital Stay (HOSPITAL_COMMUNITY): Payer: Medicaid Other

## 2017-10-29 ENCOUNTER — Other Ambulatory Visit: Payer: Self-pay

## 2017-10-29 DIAGNOSIS — I5022 Chronic systolic (congestive) heart failure: Secondary | ICD-10-CM

## 2017-10-29 DIAGNOSIS — F1010 Alcohol abuse, uncomplicated: Secondary | ICD-10-CM

## 2017-10-29 DIAGNOSIS — Z7189 Other specified counseling: Secondary | ICD-10-CM

## 2017-10-29 LAB — CBC
HCT: 37.8 % — ABNORMAL LOW (ref 39.0–52.0)
Hemoglobin: 11.8 g/dL — ABNORMAL LOW (ref 13.0–17.0)
MCH: 32.8 pg (ref 26.0–34.0)
MCHC: 31.2 g/dL (ref 30.0–36.0)
MCV: 105 fL — AB (ref 78.0–100.0)
Platelets: 270 10*3/uL (ref 150–400)
RBC: 3.6 MIL/uL — ABNORMAL LOW (ref 4.22–5.81)
RDW: 16.1 % — AB (ref 11.5–15.5)
WBC: 19.5 10*3/uL — AB (ref 4.0–10.5)

## 2017-10-29 LAB — BRAIN NATRIURETIC PEPTIDE: B Natriuretic Peptide: 3014.6 pg/mL — ABNORMAL HIGH (ref 0.0–100.0)

## 2017-10-29 LAB — BASIC METABOLIC PANEL
Anion gap: 13 (ref 5–15)
BUN: 10 mg/dL (ref 8–23)
CALCIUM: 8.5 mg/dL — AB (ref 8.9–10.3)
CO2: 27 mmol/L (ref 22–32)
CREATININE: 0.89 mg/dL (ref 0.61–1.24)
Chloride: 97 mmol/L — ABNORMAL LOW (ref 98–111)
GFR calc Af Amer: >60 mL/min (ref >60)
GLUCOSE: 152 mg/dL — AB (ref 70–99)
Potassium: 4.5 mmol/L (ref 3.5–5.1)
Sodium: 137 mmol/L (ref 135–145)

## 2017-10-29 LAB — TROPONIN I: Troponin I: 0.71 ng/mL (ref <0.03)

## 2017-10-29 MED ORDER — FUROSEMIDE 10 MG/ML IJ SOLN
20.0000 mg | Freq: Once | INTRAMUSCULAR | Status: AC
Start: 2017-10-29 — End: 2017-10-29
  Administered 2017-10-29: 20 mg via INTRAVENOUS
  Filled 2017-10-29: qty 2

## 2017-10-29 MED FILL — Heparin Sod (Porcine)-NaCl IV Soln 1000 Unit/500ML-0.9%: INTRAVENOUS | Qty: 1000 | Status: AC

## 2017-10-29 MED FILL — Verapamil HCl IV Soln 2.5 MG/ML: INTRAVENOUS | Qty: 2 | Status: AC

## 2017-10-29 MED FILL — Nitroglycerin IV Soln 100 MCG/ML in D5W: INTRA_ARTERIAL | Qty: 10 | Status: AC

## 2017-10-29 NOTE — Consult Note (Signed)
Consultation Note Date: 10/29/2017   Patient Name: Caleb Carpenter  DOB: February 12, 1956  MRN: [id number]  Age / Sex: 62 y.o., male  PCP: Evelene Croon, MD Referring Physician: [place], Peter M, MD  Reason for Consultation: Establishing goals of care and Psychosocial/spiritual support  HPI/Patient Profile: 62 y.o. male  with past medical history of COPD, HTN, and alcohol use who was admitted on 10/28/2017 with chest pain.  He was found to be positive for STEMI.  He has had a full cardiac work up and was found to have severe coronary artery disease, heart failure with an LVEF of 15% and pulmonary artery pressure of 71 mmhg.  He is quite cachectic and is not a candidate for CABG.  A palliative approach to care has been recommended and PMT was consulted for a goals of care discussion.  Clinical Assessment and Goals of Care:  I have reviewed medical records including EPIC notes, labs and imaging, received report from the bedside RN, assessed the patient and then attempted to call his family  to discuss diagnosis prognosis, GOC, EOL wishes, disposition and options.  The patient was awake and alert, but not fully coherent.  He is edentulous and a little difficult to understand. He does not seem to grasp his current situation.  He tells me that at time of discharge he is going home and he will not be alone, but he states "I'm not going to tell you who will be there with me".    He states that if he needs help with something he goes to his brother Cindee Lame.  When I asked him about Myriam Jacobson (the other contact on face sheet) he gives me an angry look.  We talked briefly about code status.  The patient commented "Start me up!"  We discussed a brief life review of the patient. He states that he does not work.  He lives in [place] (not alone).  He is unable to walk around the house.  The best he can do is go from bed to chair.  He  claims to cook for himself and eat when he's hungry. He makes fried chicken and dumplings.  As the patient does not appear to have capacity to make complex decisions I attempted to call his brother Cindee Lame.  Neither of the numbers on face sheet was functional.  I left a message for Paarth Macho to give me a call back.   Primary Decision Maker:  OTHER to be determined.  I do not believe the patient has capacity to make complex decisions with serious consequences.    SUMMARY OF RECOMMENDATIONS     Continue full scope treatment for now.     In the interest of "First do no harm" I would recommend against CPR for this frail cachectic gentleman in event of cardiac arrest.  We will request social work to help [place] gather family contact information.  Will request PT / OT evaluations to see what he can do as part of a hospice evaluation.  PMT will follow with  you.  Code Status/Advance Care Planning:  full   Palliative Prophylaxis:   Frequent Pain Assessment  Psycho-social/Spiritual:   Desire for further Chaplaincy support:  No thank you  Prognosis:  Concerning.  Likely less than 6 months given LVEF of 15%, Advanced coronary artery disease, and pulmonary HTN, in the setting of on-going alcohol use.   Discharge Planning: To Be Determined      Primary Diagnoses: Present on Admission: . Chronic systolic heart failure (HCC) . Alcohol abuse . HTN (hypertension) . STEMI (ST elevation myocardial infarction) (HCC) . ST elevation myocardial infarction involving right coronary artery (HCC)   I have reviewed the medical record, interviewed the patient and family, and examined the patient. The following aspects are pertinent.  Past Medical History:  Diagnosis Date  . Asthma   . COPD (chronic obstructive pulmonary disease) (HCC)   . Hypertension    Social History   Socioeconomic History  . Marital status: Legally Separated    Spouse name: Not on file  . Number of children: Not on  file  . Years of education: 6  . Highest education level: 6th grade  Occupational History  . Occupation: retired  Engineer, production  . Financial resource strain: Very hard  . Food insecurity:    Worry: Often true    Inability: Often true  . Transportation needs:    Medical: No    Non-medical: No  Tobacco Use  . Smoking status: Former Smoker    Types: Cigarettes    Last attempt to quit: 06/21/2014    Years since quitting: 3.3  . Smokeless tobacco: Current User    Types: Chew  Substance and Sexual Activity  . Alcohol use: Yes    Alcohol/week: 1.2 oz    Types: 2 Cans of beer per week    Comment: per day  . Drug use: No  . Sexual activity: Never  Lifestyle  . Physical activity:    Days per week: Not on file    Minutes per session: Not on file  . Stress: Not on file  Relationships  . Social connections:    Talks on phone: Not on file    Gets together: Not on file    Attends religious service: Not on file    Active member of club or organization: Not on file    Attends meetings of clubs or organizations: Not on file    Relationship status: Not on file  Other Topics Concern  . Not on file  Social History Narrative  . Not on file   Family History  Problem Relation Age of Onset  . Diabetes Unknown   . Hypertension Unknown    Scheduled Meds: . aspirin  81 mg Oral Daily  . carvedilol  3.125 mg Oral BID WC  . clopidogrel  75 mg Oral Q breakfast  . folic acid  1 mg Oral Daily  . heparin  5,000 Units Subcutaneous Q8H  . lisinopril  10 mg Oral Daily  . mouth rinse  15 mL Mouth Rinse BID  . multivitamin with minerals  1 tablet Oral Daily  . sodium chloride flush  3 mL Intravenous Q12H  . sucralfate  1 g Oral TID WC & HS  . thiamine  100 mg Oral Daily   Or  . thiamine  100 mg Intravenous Daily  . tiotropium  18 mcg Inhalation Daily  . traZODone  50 mg Oral QHS   Continuous Infusions: . sodium chloride     PRN Meds:.sodium chloride, acetaminophen,  albuterol, guaiFENesin,  LORazepam **OR** LORazepam, ondansetron (ZOFRAN) IV, sodium chloride flush Allergies  Allergen Reactions  . Codeine Other (See Comments)    ams   Review of Systems denies CP or SOB at the moment, and no urinary problems.  complains of decreased appetite, slow bowels  Physical Exam  Cachectically thin, chronically ill appearing male, awake, alert, edentulous CV tachycardic but regular. Resp no w/c/r, NAD Abdomen thin, soft, nd, nt  Vital Signs: BP 98/71   Pulse (!) 111   Temp 97.9 F (36.6 C) (Oral)   Resp 19   Ht 5\' 5"  (1.651 m)   Wt 39.1 kg (86 lb 3.2 oz)   SpO2 98%   BMI 14.34 kg/m  Pain Scale: 0-10   Pain Score: 0-No pain   SpO2: SpO2: 98 % O2 Device:SpO2: 98 % O2 Flow Rate: .O2 Flow Rate (L/min): 3 L/min  IO: Intake/output summary:   Intake/Output Summary (Last 24 hours) at 10/29/2017 1414 Last data filed at 10/29/2017 1300 Gross per 24 hour  Intake 560.65 ml  Output 950 ml  Net -389.35 ml    LBM: Last BM Date: 10/29/17 Baseline Weight: Weight: 39.1 kg (86 lb 3.2 oz) Most recent weight: Weight: 39.1 kg (86 lb 3.2 oz)     Palliative Assessment/Data: 40%     Time In: 2:00 Time Out: 2:45 Time Total: 45 min. Greater than 50%  of this time was spent counseling and coordinating care related to the above assessment and plan.  Signed by: Norvel Richards, PA-C Palliative Medicine Pager: [phone]  Please contact Palliative Medicine Team phone at [phone] for questions and concerns.  For individual provider: See Loretha Stapler

## 2017-10-29 NOTE — Plan of Care (Signed)
  Problem: Clinical Measurements: Goal: Ability to maintain clinical measurements within normal limits will improve Outcome: Progressing Goal: Respiratory complications will improve Outcome: Progressing   Problem: Elimination: Goal: Will not experience complications related to urinary retention Outcome: Progressing   Problem: Pain Managment: Goal: General experience of comfort will improve Outcome: Progressing   Problem: Cardiac: Goal: Ability to achieve and maintain adequate cardiopulmonary perfusion will improve Outcome: Progressing Goal: Vascular access site(s) Level 0-1 will be maintained Outcome: Progressing

## 2017-10-29 NOTE — Progress Notes (Addendum)
Progress Note  Patient Name: Caleb Carpenter Date of Encounter: 10/29/2017  Primary Cardiologist: No primary care provider on file.  Subjective   No chest pain, breathing is labored this morning.   Inpatient Medications    Scheduled Meds: . aspirin  81 mg Oral Daily  . carvedilol  3.125 mg Oral BID WC  . clopidogrel  75 mg Oral Q breakfast  . folic acid  1 mg Oral Daily  . furosemide  20 mg Intravenous Once  . heparin  5,000 Units Subcutaneous Q8H  . lisinopril  10 mg Oral Daily  . mouth rinse  15 mL Mouth Rinse BID  . multivitamin with minerals  1 tablet Oral Daily  . sodium chloride flush  3 mL Intravenous Q12H  . sucralfate  1 g Oral TID WC & HS  . thiamine  100 mg Oral Daily   Or  . thiamine  100 mg Intravenous Daily  . tiotropium  18 mcg Inhalation Daily  . traZODone  50 mg Oral QHS   Continuous Infusions: . sodium chloride     PRN Meds: sodium chloride, acetaminophen, albuterol, guaiFENesin, LORazepam **OR** LORazepam, ondansetron (ZOFRAN) IV, sodium chloride flush   Vital Signs    Vitals:   10/29/17 0600 10/29/17 0700 10/29/17 0730 10/29/17 0800  BP: 103/89 (!) 114/94  (!) 116/98  Pulse: (!) 136  (!) 136 (!) 136  Resp: (!) 23 19 (!) 22 20  Temp:   (!) 97.4 F (36.3 C)   TempSrc:   Axillary   SpO2: (!) 84%  90% 94%  Weight:      Height:        Intake/Output Summary (Last 24 hours) at 10/29/2017 0902 Last data filed at 10/29/2017 0800 Gross per 24 hour  Intake 200.65 ml  Output 800 ml  Net -599.35 ml   Filed Weights   10/28/17 1345  Weight: 86 lb 3.2 oz (39.1 kg)    Telemetry    ST - Personally Reviewed  Physical Exam   General: Thin, frail cachetic W male appearing in no acute distress. Head: Normocephalic, atraumatic.  Neck: Supple, + JVD. Lungs:  Resp regular but labored, diminished throughout. Heart: Tachycardic, S1, S2, no murmur; no rub. Abdomen: Soft, non-tender, non-distended with normoactive bowel sounds.  Extremities: No  clubbing, cyanosis, edema. Distal pedal pulses are 2+ bilaterally. Right femoral site stable.  Neuro: Alert and oriented X 3. Moves all extremities spontaneously. Psych: Normal affect.  Labs    Chemistry Recent Labs  Lab 10/26/17 1827 10/28/17 1052 10/29/17 0722  NA 138 136 137  K 4.2 4.8 4.5  CL 106 97* 97*  CO2 24 25 27   GLUCOSE 97 146* 152*  BUN <5* 9 10  CREATININE 0.56* 1.10 0.89  CALCIUM 7.7* 8.6* 8.5*  PROT 5.2* 5.8*  --   ALBUMIN 2.5* 2.9*  --   AST 26 177*  --   ALT 8 24  --   ALKPHOS 42 49  --   BILITOT 0.4 1.0  --   GFRNONAA >60 >60 >60  GFRAA >60 >60 >60  ANIONGAP 8 14 13      Hematology Recent Labs  Lab 10/26/17 1827 10/28/17 1052 10/29/17 0722  WBC 5.1 21.9* 19.5*  RBC 3.46* 3.73* 3.60*  HGB 11.7* 12.1* 11.8*  HCT 34.6* 38.5* 37.8*  MCV 100.1* 103.2* 105.0*  MCH 33.8 32.4 32.8  MCHC 33.8 31.4 31.2  RDW 17.3* 16.0* 16.1*  PLT 291 258 270    Cardiac Enzymes Recent  Labs  Lab 10/26/17 1827 10/26/17 2106 10/29/17 0722  TROPONINI <0.03 <0.03 0.71*    Recent Labs  Lab 10/28/17 1106  TROPIPOC 0.81*     BNPNo results for input(s): BNP, PROBNP in the last 168 hours.   DDimer No results for input(s): DDIMER in the last 168 hours.    Radiology    Dg Chest Port 1 View  Result Date: 10/28/2017 CLINICAL DATA:  STEMI. Dry cough. Shortness of breath. Chest pain. EXAM: PORTABLE CHEST 1 VIEW COMPARISON:  10/26/2017, 04/30/2017.  CT 04/30/2017. FINDINGS: Mediastinum and hilar structures are normal. COPD. Bilateral pleural-parenchymal thickening again noted consistent with scarring. Reference is made to recent CT report of 04/30/2017 for discussion of pulmonary nodules noted. Tiny left pleural effusion. No pneumothorax. Borderline cardiomegaly with mild pulmonary venous congestion. Carotid vascular and bilateral upper extremity peripheral vascular calcification. Old right non healed femoral neck fracture. IMPRESSION: 1. COPD and pleuroparenchymal  scarring. Small left pleural effusion cannot be excluded. 2.  Cardiomegaly with mild pulmonary venous congestion. 3. Carotid and bilateral upper extremity peripheral vascular disease. Electronically Signed   By: Maisie Fus  Register   On: 10/28/2017 11:22    Cardiac Studies   Cath: 10/28/17   Prox LAD lesion is 75% stenosed.  Prox Cx to Mid Cx lesion is 90% stenosed.  Prox RCA to Mid RCA lesion is 90% stenosed.  Ost RPDA to RPDA lesion is 95% stenosed.  The left ventricular ejection fraction is less than 25% by visual estimate.  There is severe left ventricular systolic dysfunction.  LV end diastolic pressure is mildly elevated.   1. Severe 3 vessel obstructive CAD. Vessels are diffusely diseased and severely calcified. 2. Severe LV dysfunction. Sparing of the base with otherwise diffuse akinesia. EF 15%.  3. Mildly elevated LVEDP  Plan: Patient is chronically ill with severe cachexia, COPD, history of Etoh. He is not a candidate for CABG. Vessels are poorly suited for PCI and I am concerned that attempt at PCI at this point will only make his condition worse. I feel he is best managed with medical therapy and would get Palliative care consult. Prognosis is extremely poor. Discussed with Dr. Eldridge Dace.  TTE: 10/28/17  Study Conclusions  - Left ventricle: The cavity size was normal. Systolic function was   severely reduced. The estimated ejection fraction was in the   range of 20% to 25%. There is akinesis of the mid-apicalanterior,   anterolateral, lateral, inferolateral, inferior, and apical   myocardium. The study is not technically sufficient to allow   evaluation of LV diastolic function. - Mitral valve: There was mild regurgitation. - Right ventricle: Systolic function was mildly reduced. - Tricuspid valve: There was moderate regurgitation. - Pulmonary arteries: PA peak pressure: 71 mm Hg (S).  Impressions:  - The right ventricular systolic pressure was increased  consistent   with severe pulmonary hypertension.  Patient Profile     62 y.o. male PMH of HTN, COPD, asthma, chewing tobacco use, ETOH abuse and chronic diastolic HF who presented with chest pain and Code STEMI called.   Assessment & Plan    1. STEMI: Underwent cath yesterday noted above with severe 3v disease and EF of 20%. Vessels not suited for PCI and not a candidate for CABG. At this time plan is for medical therapy with overall prognosis extremely poor. Palliative care consulted for goals of care moving forward.  -- currently on ASA, plavix, BB, and ACEi  2. Acute on Chronic ICM: EF noted to be 30-35%  about a year ago. Echo this admission with further decline to 20%. CXR yesterday with small left pleural effusion and pulmonary congestion. Increased work of breathing this morning. On BB and ACEi. -- check BNP -- IV lasix 20mg  x1 -- recheck CXR  3. COPD: on scheduled inhalers  4. ETOH use: CIWA protocol in place.    Signed, Laverda Page, NP  10/29/2017, 9:02 AM  Pager # [phone]   I have examined the patient and reviewed assessment and plan and discussed with patient.  Agree with above as stated.  I personally reviewed the films with Dr. [place] during his catheterization yesterday.  Difficult situation, particularly socially.  I agree that palliative care is the best option.  He has multivessel coronary artery disease and severe LV dysfunction but is not a candidate for any invasive treatments.  LVEF is severely decreased.  Currently, he is not reporting any chest discomfort.  Watching for alcohol withdrawal.  Pressure ulcer noted by the nurse.  Encourage p.o. intake.  Await goals of care discussion.  Lance Muss   For questions or updates, please contact [facility] Please consult [email or web address] for contact info under Cardiology/STEMI.

## 2017-10-29 NOTE — Progress Notes (Signed)
CRITICAL VALUE ALERT  Critical Value:  Troponin .67  Date & Time Notied:  10/29/2017  At 0845  Provider Notified: NP Su Hilt rounding at bedside and informed of lab value  Orders Received/Actions taken: no new orders at this time

## 2017-10-29 NOTE — Progress Notes (Signed)
Initial Nutrition Assessment  DOCUMENTATION CODES:   Severe malnutrition in context of chronic illness, Underweight  INTERVENTION:   Ensure Enlive po BID, each supplement provides 350 kcal and 20 grams of protein Magic cup TID with meals, each supplement provides 290 kcal and 9 grams of protein  If aggressive care recommend enteral nutrition support Osmolite 1.2 advancing slowly (high risk of refeeding) to goal rate of 55 ml/hr with 30 ml Prostat daily (provides: 1684 kcal, 88 grams protein, and 1070 ml free water)   NUTRITION DIAGNOSIS:   Severe Malnutrition related to chronic illness(COPD, with possible social factors) as evidenced by severe muscle depletion, severe fat depletion.  GOAL:   Patient will meet greater than or equal to 90% of their needs  MONITOR:   PO intake, Supplement acceptance  REASON FOR ASSESSMENT:   Malnutrition Screening Tool    ASSESSMENT:   Pt with PMH of severe malnutrition, HTN, COPD, asthma, chewing tobacco use, ETOH abuse, CHF who was admitted with STEMI.     Pt now s/p cath with severe 3 vessel disease and EF 20%. Pt is not a candidate for a CABG and has an extremely poor prognosis per cardiology who recommends a palliative care consult.  Palliative care now following and states he does not have capacity to make decisions and is working to contact family.   Pt is edentulous and difficult to understand.  Pt said he would get his brother-in-law to bring in his dentures. Lunch in front of patient untouched, he reports he has ate all he can. States he likes ensure and will drink them but unable to eat much. Per pt he can walk around his house but to others he has said he cannot. He reports eating 2 meals per day that are cooked and gives the example of beans on the stove. Intake unclear. He reports that he continues to drink ETOH but a lot less, around 2-3 beers per day. States he loves his chewing tobacco.  Per chart review weight range of 86-102  lb over the past year.   Meal Completion: bites of a hard boiled egg at breakfast this am  Medications reviewed and include: folic acid, MIV, carafate, thiamine  Labs reviewed: BNP: 3014.6    NUTRITION - FOCUSED PHYSICAL EXAM:    Most Recent Value  Orbital Region  Severe depletion  Upper Arm Region  Severe depletion  Thoracic and Lumbar Region  Severe depletion  Buccal Region  Severe depletion  Temple Region  Severe depletion  Clavicle Bone Region  Severe depletion  Clavicle and Acromion Bone Region  Severe depletion  Scapular Bone Region  Severe depletion  Dorsal Hand  Severe depletion  Patellar Region  Severe depletion  Anterior Thigh Region  Severe depletion  Posterior Calf Region  Severe depletion  Edema (RD Assessment)  None  Hair  Reviewed  Eyes  Reviewed  Mouth  Reviewed  Skin  Reviewed  Nails  Reviewed       Diet Order:   Diet Order           Diet Heart Room service appropriate? Yes; Fluid consistency: Thin  Diet effective now          EDUCATION NEEDS:   Not appropriate for education at this time  Skin:  Skin Assessment: Skin Integrity Issues: Skin Integrity Issues:: Stage I Stage I: sacrum   Last BM:  7/15  Height:   Ht Readings from Last 1 Encounters:  10/28/17 5\' 5"  (1.651 m)  Weight:   Wt Readings from Last 1 Encounters:  10/28/17 86 lb 3.2 oz (39.1 kg)    Ideal Body Weight:  56.8 kg  BMI:  Body mass index is 14.34 kg/m.  Estimated Nutritional Needs:   Kcal:  1500-1700  Protein:  70-85 grams  Fluid:  > 1.5 L/day  Kendell Bane RD, LDN, CNSC [phone] Pager [phone] After Hours Pager

## 2017-10-30 DIAGNOSIS — E43 Unspecified severe protein-calorie malnutrition: Secondary | ICD-10-CM | POA: Diagnosis present

## 2017-10-30 DIAGNOSIS — Z515 Encounter for palliative care: Secondary | ICD-10-CM

## 2017-10-30 LAB — CBC
HEMATOCRIT: 32 % — AB (ref 39.0–52.0)
HEMOGLOBIN: 10.2 g/dL — AB (ref 13.0–17.0)
MCH: 32.6 pg (ref 26.0–34.0)
MCHC: 31.9 g/dL (ref 30.0–36.0)
MCV: 102.2 fL — ABNORMAL HIGH (ref 78.0–100.0)
Platelets: 165 10*3/uL (ref 150–400)
RBC: 3.13 MIL/uL — ABNORMAL LOW (ref 4.22–5.81)
RDW: 15.5 % (ref 11.5–15.5)
WBC: 9.8 10*3/uL (ref 4.0–10.5)

## 2017-10-30 LAB — COMPREHENSIVE METABOLIC PANEL
ALBUMIN: 2.3 g/dL — AB (ref 3.5–5.0)
ALK PHOS: 39 U/L (ref 38–126)
ALT: 62 U/L — AB (ref 0–44)
ANION GAP: 11 (ref 5–15)
AST: 270 U/L — ABNORMAL HIGH (ref 15–41)
BUN: 5 mg/dL — ABNORMAL LOW (ref 8–23)
CALCIUM: 8.1 mg/dL — AB (ref 8.9–10.3)
CO2: 31 mmol/L (ref 22–32)
CREATININE: 0.64 mg/dL (ref 0.61–1.24)
Chloride: 97 mmol/L — ABNORMAL LOW (ref 98–111)
GFR calc non Af Amer: >60 mL/min (ref >60)
GLUCOSE: 76 mg/dL (ref 70–99)
Potassium: 3.2 mmol/L — ABNORMAL LOW (ref 3.5–5.1)
SODIUM: 139 mmol/L (ref 135–145)
Total Bilirubin: 0.9 mg/dL (ref 0.3–1.2)
Total Protein: 4.4 g/dL — ABNORMAL LOW (ref 6.5–8.1)

## 2017-10-30 LAB — URINE CULTURE: Culture: <10000 — AB

## 2017-10-30 MED ORDER — LOSARTAN POTASSIUM 25 MG PO TABS
12.5000 mg | ORAL_TABLET | Freq: Every day | ORAL | Status: DC
  Administered 2017-10-30: 12.5 mg via ORAL
  Filled 2017-10-30 (×2): qty 1

## 2017-10-30 MED ORDER — POTASSIUM CHLORIDE CRYS ER 20 MEQ PO TBCR
40.0000 meq | EXTENDED_RELEASE_TABLET | Freq: Once | ORAL | Status: AC
  Administered 2017-10-30: 40 meq via ORAL
  Filled 2017-10-30: qty 2

## 2017-10-30 MED ORDER — SODIUM CHLORIDE 0.9 % IV BOLUS
500.0000 mL | Freq: Once | INTRAVENOUS | Status: AC
  Administered 2017-10-30: 500 mL via INTRAVENOUS

## 2017-10-30 MED ORDER — ENSURE ENLIVE PO LIQD
237.0000 mL | Freq: Two times a day (BID) | ORAL | Status: DC
  Administered 2017-10-31 – 2017-11-10 (×21): 237 mL via ORAL

## 2017-10-30 NOTE — Progress Notes (Signed)
Family member named Ellene Route claiming to be patients sister called around 2330 last night for updates on patient. She stated she lives in [place] and patient does have closer relatives but they are not very involved with patient care.  Her phone number is: [phone] She also gave her husbands number in case we could not reach her: [phone].  She stated she would call back today for further updates and to speak with doctor when possible.

## 2017-10-30 NOTE — Evaluation (Signed)
Occupational Therapy Evaluation Patient Details Name: Caleb Carpenter MRN: [id number] DOB: Nov 23, 1955 Today's Date: 10/30/2017    History of Present Illness This 62 y.o. male admitted with substernal CP.  EKG showed STEMI.   PMH includes:  COPD, HNT, ETOH abuse, HTN, asthma, thrombocytopenia, dementia   Clinical Impression   Pt admitted with above. He demonstrates the below listed deficits and will benefit from continued OT to maximize safety and independence with BADLs.  Pt presents to OT with generalized weakness, decreased activity tolerance, impaired balance, cognitive deficits.  He currently requires mod - max A for ADLs, and min A for functional transfers.   He reports he lives with Alicia Amel, the brother of his sister's spouse.  He indicates he was independent with ADLs and ambulation, but indicates he has injured his shoulder during falls (but denies falls when asked directly).   He reports he does not drive.  Recommend 24 hour assist at discharge with SNF likely safest option.     Vitals:  BP supine 76/64; seated 68/57; end of session 96/75 MAP 82; HR 95 02 sats 88-92% on 2L supplemental 02         Follow Up Recommendations  SNF;Supervision/Assistance - 24 hour    Equipment Recommendations  3 in 1 bedside commode    Recommendations for Other Services       Precautions / Restrictions Precautions Precautions: Fall      Mobility Bed Mobility Overal bed mobility: Needs Assistance Bed Mobility: Supine to Sit     Supine to sit: Min assist     General bed mobility comments: assist to lift shoulders from bed   Transfers Overall transfer level: Needs assistance Equipment used: 1 person hand held assist Transfers: Sit to/from [employer] Sit to Stand: Min assist Stand pivot transfers: Min assist;+2 safety/equipment       General transfer comment: assist to steady     Balance Overall balance assessment: Needs assistance Sitting-balance support:  Feet supported Sitting balance-Leahy Scale: Fair     Standing balance support: Single extremity supported Standing balance-Leahy Scale: Poor Standing balance comment: min A for balance, and UE support                            ADL either performed or assessed with clinical judgement   ADL Overall ADL's : Needs assistance/impaired Eating/Feeding: Moderate assistance;Sitting Eating/Feeding Details (indicate cue type and reason): Pt does not self initiate feeding  Grooming: Wash/dry hands;Wash/dry face;Brushing hair;Moderate assistance;Sitting Grooming Details (indicate cue type and reason): due to ROM limitations of shoulders  Upper Body Bathing: Minimal assistance;Sitting   Lower Body Bathing: Moderate assistance;Sit to/from stand Lower Body Bathing Details (indicate cue type and reason): unable to access feet  Upper Body Dressing : Minimal assistance;Sitting   Lower Body Dressing: Maximal assistance;Sit to/from stand Lower Body Dressing Details (indicate cue type and reason): unable to access feet  Toilet Transfer: Minimal assistance;+2 for safety/equipment;Stand-pivot;BSC   Toileting- Clothing Manipulation and Hygiene: Sit to/from stand;Moderate assistance       Functional mobility during ADLs: Minimal assistance       Vision         Perception     Praxis      Pertinent Vitals/Pain Pain Assessment: No/denies pain     Hand Dominance Right   Extremity/Trunk Assessment Upper Extremity Assessment Upper Extremity Assessment: RUE deficits/detail;LUE deficits/detail RUE Deficits / Details: shoulder elevation to ~70-80*.  He reports multiple falls resulting  in injury  LUE Deficits / Details: Shoulder elevation to 80-90*.  He indicates injuries due to falls        Cervical / Trunk Assessment Cervical / Trunk Assessment: Kyphotic   Communication Communication Communication: Expressive difficulties(endentulous - speech difficult to understand )    Cognition Arousal/Alertness: Awake/alert Behavior During Therapy: WFL for tasks assessed/performed;Flat affect Overall Cognitive Status: No family/caregiver present to determine baseline cognitive functioning                                 General Comments: Pt thinks it's Wed (is Thurs), June (it's July), 2019, and knows he is in the hospital.  He follows commands consistently.  He appears to have poor awareness of current situation    General Comments  BP supine 76/64; seated 68/57; end of session 96/75 MAP 82; HR 95 02 sats 88-92% on 2L supplemental 02     Exercises     Shoulder Instructions      Home Living Family/patient expects to be discharged to:: Private residence Living Arrangements: Non-relatives/Friends(Lives with Mitzi Davenport) Available Help at Discharge: Friend(s);Available 24 hours/day Type of Home: House Home Access: Ramped entrance     Home Layout: One level     Bathroom Shower/Tub: Chief Strategy Officer: Standard     Home Equipment: Environmental consultant - 2 wheels   Additional Comments: Pt reports he lives with a friend named Alicia Amel, who is the brother of his sister's husband.   [name] works 3 days/wk, and provides transportation to MD appointements       Prior Functioning/Environment Level of Independence: Independent        Comments: Pt reports he ambulates without a device.  He denies falls, but later says he has hurt his shoulders multiple times during falls.  He indicates he changes his clothes every other day, and acknowledges he doesn't eat much         OT Problem List: Decreased strength;Decreased activity tolerance;Impaired balance (sitting and/or standing);Decreased cognition;Decreased safety awareness;Decreased knowledge of use of DME or AE;Cardiopulmonary status limiting activity      OT Treatment/Interventions: Self-care/ADL training;DME and/or AE instruction;Therapeutic activities;Cognitive  remediation/compensation;Patient/family education;Balance training;Therapeutic exercise;Energy conservation    OT Goals(Current goals can be found in the care plan section) Acute Rehab OT Goals Patient Stated Goal: "to go home"  OT Goal Formulation: With patient Time For Goal Achievement: 11/13/17 Potential to Achieve Goals: Fair ADL Goals Pt Will Perform Grooming: with min assist;standing Pt Will Perform Upper Body Bathing: with set-up;with supervision;sitting Pt Will Perform Lower Body Bathing: with min assist;with adaptive equipment;sit to/from stand Pt Will Perform Upper Body Dressing: with set-up;with supervision;sitting Pt Will Perform Lower Body Dressing: with min assist;with adaptive equipment;sit to/from stand Pt Will Transfer to Toilet: with min guard assist;ambulating;regular height toilet;bedside commode Pt Will Perform Toileting - Clothing Manipulation and hygiene: with min guard assist;sit to/from stand Additional ADL Goal #1: Pt will incorporate energy conservation techniques into daily activity with min cues  OT Frequency: Min 2X/week   Barriers to D/C: Decreased caregiver support          Co-evaluation PT/OT/SLP Co-Evaluation/Treatment: Yes Reason for Co-Treatment: Necessary to address cognition/behavior during functional activity;For patient/therapist safety;To address functional/ADL transfers   OT goals addressed during session: ADL's and self-care      AM-PAC PT "6 Clicks" Daily Activity     Outcome Measure Help from another person eating meals?: A Lot Help from  another person taking care of personal grooming?: A Lot Help from another person toileting, which includes using toliet, bedpan, or urinal?: A Lot Help from another person bathing (including washing, rinsing, drying)?: A Lot Help from another person to put on and taking off regular upper body clothing?: A Little Help from another person to put on and taking off regular lower body clothing?: A Lot 6  Click Score: 13   End of Session Equipment Utilized During Treatment: Oxygen Nurse Communication: Mobility status  Activity Tolerance: Patient limited by fatigue Patient left: in chair;with call bell/phone within reach;with chair alarm set  OT Visit Diagnosis: Unsteadiness on feet (R26.81)                Time: 1610-9604 OT Time Calculation (min): 22 min Charges:  OT General Charges $OT Visit: 1 Visit OT Evaluation $OT Eval Moderate Complexity: 1 Mod G-Codes:     [employer], OTR/L [phone]   Jeani Hawking M 10/30/2017, 3:20 PM

## 2017-10-30 NOTE — Evaluation (Signed)
Physical Therapy Evaluation Patient Details Name: Caleb Carpenter MRN: [id number] DOB: Jun 18, 1955 Today's Date: 10/30/2017   History of Present Illness  This 62 y.o. male admitted with substernal CP.  EKG showed STEMI.   PMH includes:  COPD, HNT, ETOH abuse, HTN, asthma, thrombocytopenia, dementia    Clinical Impression  Pt admitted with above diagnosis. Pt currently with functional limitations due to the deficits listed below (see PT Problem List). PTA, pt reports living with friend, independent with household ambulation without AD. Upon eval pt presents with generalized weakness and limited activity tolerance. BP soft but MAP above 60. Min A for transfers to bedside chair, unsafe to mobilize without supervision at this time.  Pt will benefit from skilled PT to increase their independence and safety with mobility to allow discharge to the venue listed below.       Follow Up Recommendations SNF;Supervision/Assistance - 24 hour    Equipment Recommendations  None recommended by PT    Recommendations for Other Services       Precautions / Restrictions Precautions Precautions: Fall      Mobility  Bed Mobility Overal bed mobility: Needs Assistance Bed Mobility: Supine to Sit     Supine to sit: Min assist     General bed mobility comments: assist to lift shoulders from bed   Transfers Overall transfer level: Needs assistance Equipment used: 1 person hand held assist Transfers: Sit to/from Stand;Stand Pivot Transfers Sit to Stand: Min assist Stand pivot transfers: Min assist;+2 safety/equipment       General transfer comment: assist to steady   Ambulation/Gait             General Gait Details: defered 2/2 weakness.   Stairs            Wheelchair Mobility    Modified Rankin (Stroke Patients Only)       Balance Overall balance assessment: Needs assistance Sitting-balance support: Feet supported Sitting balance-Leahy Scale: Fair     Standing balance  support: Single extremity supported Standing balance-Leahy Scale: Poor Standing balance comment: min A for balance, and UE support                              Pertinent Vitals/Pain Pain Assessment: No/denies pain    Home Living Family/patient expects to be discharged to:: Private residence Living Arrangements: Non-relatives/Friends Available Help at Discharge: Friend(s);Available 24 hours/day Type of Home: House Home Access: Ramped entrance     Home Layout: One level Home Equipment: Walker - 2 wheels Additional Comments: Pt reports he lives with a friend named Alicia Amel, who is the brother of his sister's husband.   [name] works 3 days/wk, and provides transportation to MD appointements     Prior Function Level of Independence: Independent         Comments: Pt reports he ambulates without a device.  He denies falls, but later says he has hurt his shoulders multiple times during falls.  He indicates he changes his clothes every other day, and acknowledges he doesn't eat much      Hand Dominance   Dominant Hand: Right    Extremity/Trunk Assessment   Upper Extremity Assessment Upper Extremity Assessment: Defer to OT evaluation RUE Deficits / Details: shoulder elevation to ~70-80*.  He reports multiple falls resulting in injury  LUE Deficits / Details: Shoulder elevation to 80-90*.  He indicates injuries due to falls     Lower Extremity Assessment Lower Extremity Assessment:  Generalized weakness    Cervical / Trunk Assessment Cervical / Trunk Assessment: Kyphotic  Communication   Communication: Expressive difficulties(speech difficult to understand )  Cognition Arousal/Alertness: Awake/alert Behavior During Therapy: WFL for tasks assessed/performed;Flat affect Overall Cognitive Status: No family/caregiver present to determine baseline cognitive functioning                                 General Comments: Pt thinks it's Wed (is Thurs),  June (it's July), 2019, and knows he is in the hospital.  He follows commands consistently.  He appears to have poor awareness of current situation       General Comments General comments (skin integrity, edema, etc.): BP supine 76/64; seated 68/57; end of session 96/75 MAP 82; HR 95 02 sats 88-92% on 2L supplemental 02     Exercises     Assessment/Plan    PT Assessment Patient needs continued PT services  PT Problem List Decreased strength;Decreased range of motion;Decreased activity tolerance;Decreased balance;Decreased mobility;Decreased safety awareness;Cardiopulmonary status limiting activity       PT Treatment Interventions DME instruction;Gait training;Functional mobility training;Stair training;Therapeutic activities;Therapeutic exercise;Balance training    PT Goals (Current goals can be found in the Care Plan section)  Acute Rehab PT Goals Patient Stated Goal: "to go home"  PT Goal Formulation: With patient Time For Goal Achievement: 11/06/17 Potential to Achieve Goals: Fair    Frequency Min 2X/week   Barriers to discharge        Co-evaluation PT/OT/SLP Co-Evaluation/Treatment: Yes Reason for Co-Treatment: Necessary to address cognition/behavior during functional activity;For patient/therapist safety;To address functional/ADL transfers PT goals addressed during session: Mobility/safety with mobility;Balance;Proper use of DME;Strengthening/ROM OT goals addressed during session: ADL's and self-care       AM-PAC PT "6 Clicks" Daily Activity  Outcome Measure Difficulty turning over in bed (including adjusting bedclothes, sheets and blankets)?: Unable Difficulty moving from lying on back to sitting on the side of the bed? : Unable Difficulty sitting down on and standing up from a chair with arms (e.g., wheelchair, bedside commode, etc,.)?: Unable Help needed moving to and from a bed to chair (including a wheelchair)?: A Little Help needed walking in hospital room?: A  Lot Help needed climbing 3-5 steps with a railing? : Total 6 Click Score: 9    End of Session   Activity Tolerance: Patient limited by fatigue Patient left: in chair;with call bell/phone within reach Nurse Communication: Mobility status PT Visit Diagnosis: Unsteadiness on feet (R26.81);Other abnormalities of gait and mobility (R26.89);Muscle weakness (generalized) (M62.81)    Time: 0998-3382 PT Time Calculation (min) (ACUTE ONLY): 30 min   Charges:   PT Evaluation $PT Eval Moderate Complexity: 1 Mod     PT G Codes:       Etta Grandchild, PT, DPT Acute Rehab Services Pager: [phone]    Etta Grandchild 10/30/2017, 3:54 PM

## 2017-10-30 NOTE — Progress Notes (Signed)
Cardiology Moonlighter Note  Called to the bedside to evaluate patient for hypotension.  Patient has had labile blood pressures all day today with occasional maps in the 50s and 60s.  Currently bedside automatic blood pressure cuff reading systolic pressures in the 60s to 70s with maps in the 50s.  Patient in Trendelenburg position.  On my exam, the patient has limited insight into his current medical state.  He did however reaffirm his desire to remain full code, stating he wants "what ever you think would help me."  JVP less than 5 cm H2O.  Warm extremities with diminished pulses throughout.  Hypotension confirmed by radial artery Doppler.  Although patient is at high risk for cardiogenic shock given his recent STEMI and very low LV systolic function, physical exam suggests current hypotension is more likely hypovolemic in etiology.  Will check lactate and VBG.  Will administer 500 cc normal saline bolus and reassess for improvement in blood pressure.  The patient's current clinical status is very tenuous and he will be monitored closely throughout the night.  Rosario Jacks, MD Cardiology Fellow, PGY-6

## 2017-10-30 NOTE — Progress Notes (Addendum)
No charge note.  I spoke with Cordelia Pen father in law this morning as French Ana was at work.  French Ana is Hershall Rockman's niece.  Her father in law explained that Mikos has no wife and no children.  He has several siblings most of which are somewhat infirmed.   He gave me telephone numbers for 2 of Derwood's sisters   (1) [name] [phone] and (2) Carley Hammed [phone].    I will reach out to Hilda Lias and Carley Hammed today for assistance in making health care decisions for Mr. Salber.  Norvel Richards, PA-C Palliative Medicine Pager: [phone]

## 2017-10-30 NOTE — Progress Notes (Signed)
Attempted to call Caleb Carpenter 2x left voice mail messages.  I also called his sister Carley Hammed and left a message regarding Hospice services and requesting a call back.  Talked with patient.  He states that he feels "50/50" and he still "has some pain around his heart" but he is hopeful to go home tomorrow.  His sister Caleb Carpenter is married.  Mamon lives with Marie's husband's brother Bethann Berkshire (his brother in law's brother).    Two meals are untouched beside the patient.  The patient is lying in his recliner chair.  Bp on the monitor is 62/52.  Earlier BP at 12:00 noon was 70/57.  We talked about his physical capabilities.  He tells me he can go from bed to bedside chair, and that this is what he was doing at home prior to admission.  Assessment:  62 yo male with advanced coronary artery disease, LVEF of 15%, and pulmonary HTN, he is cachectic, and essentially bed bound.  He is eligible for Hospice Services in the Home should he elect to receive them.  I would like to have the benefit of communicating with his family.  His code status may not change but least we code provide education about what to expect and how to best handle it.  I feel he would be better off with Hospice support at home.  PMT will attempt to reach out to his family again tomorrow.  Norvel Richards, PA-C Palliative Medicine Pager: [phone]  25 min.

## 2017-10-30 NOTE — Progress Notes (Signed)
MD Carnicelli called at 2208 due to change in patient status. Patient's 2200 pressure was 60/47(54) and BP was recycled with similar results- MAPs have been in the 70s for most of the shift. Pt was alert and oriented to place and situation an mentation was appropriate. Pt was placed in trendelenburg position and MD was called. MD presented at the bedside at 2215 Manual BP performed that correlated with automatic cuff pressures. MD then ordered for 500cc fluid bolus. Bolus is being administered. Patient's MAP remains in the low 60s. RN will continue to assess for improvement in blood pressure.

## 2017-10-30 NOTE — Progress Notes (Addendum)
Progress Note  Patient Name: Caleb Carpenter Date of Encounter: 10/30/2017  Primary Cardiologist: No primary care provider on file.  Subjective   No chest pain, resting comfortably in bed.   Inpatient Medications    Scheduled Meds: . aspirin  81 mg Oral Daily  . carvedilol  3.125 mg Oral BID WC  . clopidogrel  75 mg Oral Q breakfast  . folic acid  1 mg Oral Daily  . heparin  5,000 Units Subcutaneous Q8H  . lisinopril  10 mg Oral Daily  . mouth rinse  15 mL Mouth Rinse BID  . multivitamin with minerals  1 tablet Oral Daily  . sodium chloride flush  3 mL Intravenous Q12H  . sucralfate  1 g Oral TID WC & HS  . thiamine  100 mg Oral Daily   Or  . thiamine  100 mg Intravenous Daily  . tiotropium  18 mcg Inhalation Daily  . traZODone  50 mg Oral QHS   Continuous Infusions: . sodium chloride     PRN Meds: sodium chloride, acetaminophen, albuterol, guaiFENesin, LORazepam **OR** LORazepam, ondansetron (ZOFRAN) IV, sodium chloride flush   Vital Signs    Vitals:   10/30/17 0414 10/30/17 0500 10/30/17 0600 10/30/17 0700  BP: 93/68 93/75 90/74  97/77  Pulse: 89 96 92 91  Resp: 15 15 13 14   Temp: 97.9 F (36.6 C)     TempSrc: Oral     SpO2: 98% 98% 98% 100%  Weight:   81 lb 12.7 oz (37.1 kg)   Height:        Intake/Output Summary (Last 24 hours) at 10/30/2017 0756 Last data filed at 10/30/2017 0300 Gross per 24 hour  Intake 360 ml  Output 625 ml  Net -265 ml   Filed Weights   10/28/17 1345 10/30/17 0600  Weight: 86 lb 3.2 oz (39.1 kg) 81 lb 12.7 oz (37.1 kg)    Telemetry    SR with episodes of tachycardia - Personally Reviewed  Physical Exam   General: Thin, cachetic W male appearing in no acute distress. Head: Normocephalic, atraumatic.  Neck: Supple, no JVD. Lungs:  Resp regular and unlabored, diminished in the bases. Heart: RRR, S1, S2, no murmur; no rub. Abdomen: Soft, non-tender, non-distended with normoactive bowel sounds.  Extremities: No clubbing,  cyanosis, edema. Distal pedal pulses are 2+ bilaterally. Neuro: Alert and oriented X 3. Moves all extremities spontaneously. Psych: Normal affect.  Labs    Chemistry Recent Labs  Lab 10/26/17 1827 10/28/17 1052 10/29/17 0722  NA 138 136 137  K 4.2 4.8 4.5  CL 106 97* 97*  CO2 24 25 27   GLUCOSE 97 146* 152*  BUN <5* 9 10  CREATININE 0.56* 1.10 0.89  CALCIUM 7.7* 8.6* 8.5*  PROT 5.2* 5.8*  --   ALBUMIN 2.5* 2.9*  --   AST 26 177*  --   ALT 8 24  --   ALKPHOS 42 49  --   BILITOT 0.4 1.0  --   GFRNONAA >60 >60 >60  GFRAA >60 >60 >60  ANIONGAP 8 14 13      Hematology Recent Labs  Lab 10/26/17 1827 10/28/17 1052 10/29/17 0722  WBC 5.1 21.9* 19.5*  RBC 3.46* 3.73* 3.60*  HGB 11.7* 12.1* 11.8*  HCT 34.6* 38.5* 37.8*  MCV 100.1* 103.2* 105.0*  MCH 33.8 32.4 32.8  MCHC 33.8 31.4 31.2  RDW 17.3* 16.0* 16.1*  PLT 291 258 270    Cardiac Enzymes Recent Labs  Lab 10/26/17 1827  10/26/17 2106 10/29/17 0722  TROPONINI <0.03 <0.03 0.71*    Recent Labs  Lab 10/28/17 1106  TROPIPOC 0.81*     BNP Recent Labs  Lab 10/29/17 0702  BNP 3,014.6*     DDimer No results for input(s): DDIMER in the last 168 hours.    Radiology    Dg Chest Port 1 View  Result Date: 10/29/2017 CLINICAL DATA:  Shortness of breath.  COPD. EXAM: PORTABLE CHEST 1 VIEW COMPARISON:  10/28/2017 FINDINGS: Heart size remains within normal limits.  Aortic atherosclerosis. Pulmonary emphysema again demonstrated. Increased opacity is seen in both lung bases, which may be due to superimposed pulmonary edema or infection. New tiny right pleural effusion is seen. IMPRESSION: Increased bilateral lower lung opacity superimposed on severe emphysema. This is suspicious for superimposed pulmonary edema or infection. New tiny right pleural effusion. Electronically Signed   By: Myles Rosenthal M.D.   On: 10/29/2017 10:52   Dg Chest Port 1 View  Result Date: 10/28/2017 CLINICAL DATA:  STEMI. Dry cough. Shortness  of breath. Chest pain. EXAM: PORTABLE CHEST 1 VIEW COMPARISON:  10/26/2017, 04/30/2017.  CT 04/30/2017. FINDINGS: Mediastinum and hilar structures are normal. COPD. Bilateral pleural-parenchymal thickening again noted consistent with scarring. Reference is made to recent CT report of 04/30/2017 for discussion of pulmonary nodules noted. Tiny left pleural effusion. No pneumothorax. Borderline cardiomegaly with mild pulmonary venous congestion. Carotid vascular and bilateral upper extremity peripheral vascular calcification. Old right non healed femoral neck fracture. IMPRESSION: 1. COPD and pleuroparenchymal scarring. Small left pleural effusion cannot be excluded. 2.  Cardiomegaly with mild pulmonary venous congestion. 3. Carotid and bilateral upper extremity peripheral vascular disease. Electronically Signed   By: Maisie Fus  Register   On: 10/28/2017 11:22    Cardiac Studies   Cath: 10/28/17   Prox LAD lesion is 75% stenosed.  Prox Cx to Mid Cx lesion is 90% stenosed.  Prox RCA to Mid RCA lesion is 90% stenosed.  Ost RPDA to RPDA lesion is 95% stenosed.  The left ventricular ejection fraction is less than 25% by visual estimate.  There is severe left ventricular systolic dysfunction.  LV end diastolic pressure is mildly elevated.  1. Severe 3 vessel obstructive CAD. Vessels are diffusely diseased and severely calcified. 2. Severe LV dysfunction. Sparing of the base with otherwise diffuse akinesia. EF 15%.  3. Mildly elevated LVEDP  Plan: Patient is chronically ill with severe cachexia, COPD, history of Etoh. He is not a candidate for CABG. Vessels are poorly suited for PCI and I am concerned that attempt at PCI at this point will only make his condition worse. I feel he is best managed with medical therapy and would get Palliative care consult. Prognosis is extremely poor. Discussed with Dr. Eldridge Dace.  TTE: 10/28/17  Study Conclusions  - Left ventricle: The cavity size was normal.  Systolic function was severely reduced. The estimated ejection fraction was in the range of 20% to 25%. There is akinesis of the mid-apicalanterior, anterolateral, lateral, inferolateral, inferior, and apical myocardium. The study is not technically sufficient to allow evaluation of LV diastolic function. - Mitral valve: There was mild regurgitation. - Right ventricle: Systolic function was mildly reduced. - Tricuspid valve: There was moderate regurgitation. - Pulmonary arteries: PA peak pressure: 71 mm Hg (S).  Impressions:  - The right ventricular systolic pressure was increased consistent with severe pulmonary hypertension.  Patient Profile     62 y.o. male PMH of HTN, COPD, asthma, chewing tobacco use, ETOH abuse and chronic  diastolic HF who presented with chest pain and Code STEMI called.  Assessment & Plan    1. STEMI: Underwent cath noted above with severe 3v disease and EF of 20%. Vessels not suited for PCI and not a candidate for CABG. At this time plan is for medical therapy with overall prognosis extremely poor. Palliative care consulted for goals of care moving forward.  -- currently on ASA, plavix, BB, and ACEi. Will switch to losartan to day, as he has had a dry cough. Could consider entresto at some point given plan for medical therapy.   2. Acute on Chronic ICM: EF noted to be 30-35% about a year ago. Echo this admission with further decline to 20%. CXR with small left pleural effusion and pulmonary congestion. Given dose of IV lasix yesterday, breathing improved today.   3. COPD: on scheduled inhalers  4. ETOH use: CIWA protocol in place.   5. Leukocytosis: WBC 21.9>19.5. Suspect this may be 2/2 to his MI. No fevers, UA negative. Observe for now. -- Follow CBC.   Signed, Laverda Page, NP  10/30/2017, 7:56 AM  Pager # [phone]   I have examined the patient and reviewed assessment and plan and discussed with patient.  Agree with above as  stated.  Stable medically.  Issues with malnutrition, and pressure ulcer as well.  Palliative care assistance appreciated for goals of care and possible hospice initiation.    Not actively in heart failure at this time.   Lance Muss   For questions or updates, please contact [facility] Please consult [email or web address] for contact info under Cardiology/STEMI.

## 2017-10-31 DIAGNOSIS — E43 Unspecified severe protein-calorie malnutrition: Secondary | ICD-10-CM

## 2017-10-31 LAB — CBC
HEMATOCRIT: 32.2 % — AB (ref 39.0–52.0)
HEMOGLOBIN: 9.9 g/dL — AB (ref 13.0–17.0)
MCH: 31.6 pg (ref 26.0–34.0)
MCHC: 30.7 g/dL (ref 30.0–36.0)
MCV: 102.9 fL — ABNORMAL HIGH (ref 78.0–100.0)
Platelets: 157 10*3/uL (ref 150–400)
RBC: 3.13 MIL/uL — AB (ref 4.22–5.81)
RDW: 15.8 % — ABNORMAL HIGH (ref 11.5–15.5)
WBC: 6.9 10*3/uL (ref 4.0–10.5)

## 2017-10-31 LAB — BLOOD GAS, VENOUS
ACID-BASE EXCESS: 9.7 mmol/L — AB (ref 0.0–2.0)
Bicarbonate: 35.1 mmol/L — ABNORMAL HIGH (ref 20.0–28.0)
O2 SAT: 60.7 %
PCO2 VEN: 61.3 mmHg — AB (ref 44.0–60.0)
PO2 VEN: 35.1 mmHg (ref 32.0–45.0)
Patient temperature: 98.6
pH, Ven: 7.376 (ref 7.250–7.430)

## 2017-10-31 LAB — LACTIC ACID, PLASMA
LACTIC ACID, VENOUS: 0.9 mmol/L (ref 0.5–1.9)
Lactic Acid, Venous: 0.8 mmol/L (ref 0.5–1.9)

## 2017-10-31 LAB — BASIC METABOLIC PANEL
Anion gap: 5 (ref 5–15)
BUN: 5 mg/dL — AB (ref 8–23)
CO2: 33 mmol/L — AB (ref 22–32)
CREATININE: 0.62 mg/dL (ref 0.61–1.24)
Calcium: 8.3 mg/dL — ABNORMAL LOW (ref 8.9–10.3)
Chloride: 103 mmol/L (ref 98–111)
GFR calc Af Amer: >60 mL/min (ref >60)
GFR calc non Af Amer: >60 mL/min (ref >60)
Glucose, Bld: 98 mg/dL (ref 70–99)
POTASSIUM: 3.9 mmol/L (ref 3.5–5.1)
SODIUM: 141 mmol/L (ref 135–145)

## 2017-10-31 MED ORDER — METOPROLOL TARTRATE 12.5 MG HALF TABLET
12.5000 mg | ORAL_TABLET | Freq: Two times a day (BID) | ORAL | Status: DC
  Administered 2017-10-31 – 2017-11-09 (×18): 12.5 mg via ORAL
  Filled 2017-10-31 (×18): qty 1

## 2017-10-31 NOTE — Plan of Care (Signed)
  Problem: Clinical Measurements: Goal: Ability to maintain clinical measurements within normal limits will improve Outcome: Progressing Goal: Will remain free from infection Outcome: Progressing Goal: Diagnostic test results will improve Outcome: Progressing Goal: Respiratory complications will improve Outcome: Progressing Goal: Cardiovascular complication will be avoided Outcome: Progressing   Problem: Coping: Goal: Level of anxiety will decrease Outcome: Progressing   Problem: Elimination: Goal: Will not experience complications related to bowel motility Outcome: Progressing   Problem: Pain Managment: Goal: General experience of comfort will improve Outcome: Progressing   Problem: Safety: Goal: Ability to remain free from injury will improve Outcome: Progressing   Problem: Cardiac: Goal: Ability to achieve and maintain adequate cardiopulmonary perfusion will improve Outcome: Progressing   Problem: Education: Goal: Knowledge of General Education information will improve Outcome: Not Progressing   Problem: Health Behavior/Discharge Planning: Goal: Ability to manage health-related needs will improve Outcome: Not Progressing   Problem: Activity: Goal: Risk for activity intolerance will decrease Outcome: Not Progressing   Problem: Nutrition: Goal: Adequate nutrition will be maintained Outcome: Not Progressing   Problem: Skin Integrity: Goal: Risk for impaired skin integrity will decrease Outcome: Not Progressing   Problem: Education: Goal: Understanding of cardiac disease, CV risk reduction, and recovery process will improve Outcome: Not Progressing Goal: Understanding of medication regimen will improve Outcome: Not Progressing   Problem: Activity: Goal: Ability to tolerate increased activity will improve Outcome: Not Progressing   Problem: Health Behavior/Discharge Planning: Goal: Ability to safely manage health-related needs after discharge will  improve Outcome: Not Progressing

## 2017-10-31 NOTE — Progress Notes (Signed)
Patient complaining of shortness of breath. O2 sat=100 on 2L nasal cannula. Lung sounds are diminished. Patient has productive cough but is unable to cough strong enough to release sputum. RT called to administer breathing treatment and HOB raised >30 degrees. RN will continue to monitor.

## 2017-10-31 NOTE — Progress Notes (Addendum)
Progress Note  Patient Name: Caleb Carpenter Date of Encounter: 10/31/2017  Primary Cardiologist: New  Subjective   No chest pain. His only complaint is a persistent dry cough.  Inpatient Medications    Scheduled Meds: . aspirin  81 mg Oral Daily  . carvedilol  3.125 mg Oral BID WC  . clopidogrel  75 mg Oral Q breakfast  . feeding supplement (ENSURE ENLIVE)  237 mL Oral BID BM  . folic acid  1 mg Oral Daily  . heparin  5,000 Units Subcutaneous Q8H  . losartan  12.5 mg Oral Daily  . mouth rinse  15 mL Mouth Rinse BID  . multivitamin with minerals  1 tablet Oral Daily  . sodium chloride flush  3 mL Intravenous Q12H  . sucralfate  1 g Oral TID WC & HS  . thiamine  100 mg Oral Daily   Or  . thiamine  100 mg Intravenous Daily  . tiotropium  18 mcg Inhalation Daily  . traZODone  50 mg Oral QHS   Continuous Infusions: . sodium chloride 10 mL/hr at 10/31/17 0900   PRN Meds: sodium chloride, acetaminophen, albuterol, guaiFENesin, LORazepam **OR** LORazepam, ondansetron (ZOFRAN) IV, sodium chloride flush   Vital Signs    Vitals:   10/31/17 0758 10/31/17 0800 10/31/17 0830 10/31/17 0834  BP:  106/84 98/78   Pulse:  (!) 102 (!) 114   Resp:  20 19   Temp: 98.2 F (36.8 C)     TempSrc: Oral     SpO2:  100% 99% 98%  Weight:      Height:        Intake/Output Summary (Last 24 hours) at 10/31/2017 1022 Last data filed at 10/31/2017 0900 Gross per 24 hour  Intake 1012.1 ml  Output 325 ml  Net 687.1 ml   Filed Weights   10/28/17 1345 10/30/17 0600  Weight: 86 lb 3.2 oz (39.1 kg) 81 lb 12.7 oz (37.1 kg)    Telemetry    NSR, 90s   - Personally Reviewed  ECG    7/16, sinus tach 116  - Personally Reviewed  Physical Exam   GEN: frail, cachetic and disheveled looking WM in No acute distress.   Neck: No JVD Cardiac: RRR, no murmurs, rubs, or gallops.  Respiratory: Clear to auscultation bilaterally. GI: Soft, nontender, non-distended  MS: No edema; No  deformity. Neuro:  Nonfocal  Psych: Normal affect   Labs    Chemistry Recent Labs  Lab 10/26/17 1827 10/28/17 1052 10/29/17 0722 10/30/17 0844 10/31/17 0325  NA 138 136 137 139 141  K 4.2 4.8 4.5 3.2* 3.9  CL 106 97* 97* 97* 103  CO2 24 25 27 31  33*  GLUCOSE 97 146* 152* 76 98  BUN <5* 9 10 5* 5*  CREATININE 0.56* 1.10 0.89 0.64 0.62  CALCIUM 7.7* 8.6* 8.5* 8.1* 8.3*  PROT 5.2* 5.8*  --  4.4*  --   ALBUMIN 2.5* 2.9*  --  2.3*  --   AST 26 177*  --  270*  --   ALT 8 24  --  62*  --   ALKPHOS 42 49  --  39  --   BILITOT 0.4 1.0  --  0.9  --   GFRNONAA >60 >60 >60 >60 >60  GFRAA >60 >60 >60 >60 >60  ANIONGAP 8 14 13 11 5      Hematology Recent Labs  Lab 10/29/17 0722 10/30/17 0844 10/31/17 0325  WBC 19.5* 9.8 6.9  RBC 3.60* 3.13* 3.13*  HGB 11.8* 10.2* 9.9*  HCT 37.8* 32.0* 32.2*  MCV 105.0* 102.2* 102.9*  MCH 32.8 32.6 31.6  MCHC 31.2 31.9 30.7  RDW 16.1* 15.5 15.8*  PLT 270 165 157    Cardiac Enzymes Recent Labs  Lab 10/26/17 1827 10/26/17 2106 10/29/17 0722  TROPONINI <0.03 <0.03 0.71*    Recent Labs  Lab 10/28/17 1106  TROPIPOC 0.81*     BNP Recent Labs  Lab 10/29/17 0702  BNP 3,014.6*     DDimer No results for input(s): DDIMER in the last 168 hours.   Radiology    No results found.  Cardiac Studies   Cath: 10/28/17   Prox LAD lesion is 75% stenosed.  Prox Cx to Mid Cx lesion is 90% stenosed.  Prox RCA to Mid RCA lesion is 90% stenosed.  Ost RPDA to RPDA lesion is 95% stenosed.  The left ventricular ejection fraction is less than 25% by visual estimate.  There is severe left ventricular systolic dysfunction.  LV end diastolic pressure is mildly elevated.  1. Severe 3 vessel obstructive CAD. Vessels are diffusely diseased and severely calcified. 2. Severe LV dysfunction. Sparing of the base with otherwise diffuse akinesia. EF 15%.  3. Mildly elevated LVEDP  Plan: Patient is chronically ill with severe cachexia, COPD,  history of Etoh. He is not a candidate for CABG. Vessels are poorly suited for PCI and I am concerned that attempt at PCI at this point will only make his condition worse. I feel he is best managed with medical therapy and would get Palliative care consult. Prognosis is extremely poor. Discussed with Dr. Eldridge Dace.  TTE: 10/28/17  Study Conclusions  - Left ventricle: The cavity size was normal. Systolic function was severely reduced. The estimated ejection fraction was in the range of 20% to 25%. There is akinesis of the mid-apicalanterior, anterolateral, lateral, inferolateral, inferior, and apical myocardium. The study is not technically sufficient to allow evaluation of LV diastolic function. - Mitral valve: There was mild regurgitation. - Right ventricle: Systolic function was mildly reduced. - Tricuspid valve: There was moderate regurgitation. - Pulmonary arteries: PA peak pressure: 71 mm Hg (S).  Impressions:  - The right ventricular systolic pressure was increased consistent with severe pulmonary hypertension.   Patient Profile     62 y.o. male PMH of HTN, COPD, asthma, chewing tobacco use, ETOH abuse and chronic diastolic HF who presented with chest pain and Code STEMI called.  Assessment & Plan    1. CAD s/p STEMI: Cath 7/16 showed severe 3 V CAD. Due to pt being chronically ill with severe cachexia, COPD and history of ETOH, he is not a candidate for CABG. Vessels are poorly suited for PCI and there was concern that attempt at PCI would only make his condition worse. Medical therapy recommended. Medical management has included ASA, Plavix, Coreg and losartan, however pt with notable hypotension overnight with SBP in the 60s. Losartan held this morning. Coreg was given at 0800. SBP currently in the 70s. Mentating well but gets dizzy when he stands. We will stop Coreg. Add low dose metoprolol, 12.5 (starting with short acting tartrate to make sure he will  tolerate). If he is able to tolerate, we will switch to succinate. Overall prognosis is extremely poor. Palliative Care is following. Decisions regarding Hospice pending family discussion. He is currently CP free.   2. 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 There  has been a further reducation in EF post STEMI, now at 20-25%. Euvolemic on exam. Holding ARB due to hypotension. Continue with low dose BB.   3. COPD: on scheduled inhalers   4. ETOH Abuse: on CIWA Protocol.   For questions or updates, please contact [facility] Please consult [email or web address] for contact info under Cardiology/STEMI.      Signed, Robbie Lis, PA-C  10/31/2017, 10:22 AM    I have examined the patient and reviewed assessment and plan and discussed with patient.  Agree with above as stated.  Adjusting beta blocker due to hypotension.  MAy need to stop if BP stays low.  Appreciate palliative care assistance.  I think plan for hospice is most appropriate.   Lance Muss

## 2017-10-31 NOTE — Progress Notes (Signed)
   10/30/17 2355  Vitals  BP (!) 80/67  MAP (mmHg) 73  Pulse Rate 95  ECG Heart Rate 95  Resp (!) 21  Oxygen Therapy  SpO2 100 %  Post 500 cc bolus intervention. Patient's BP has continued to increase throughout the night with most recent BP 102/81 (89). Patient is currently AAO, skin is warm and dry.

## 2017-10-31 NOTE — Progress Notes (Addendum)
Daily Progress Note   Patient Name: Caleb Carpenter       Date: 10/31/2017 DOB: September 14, 1955  Age: 62 y.o. MRN#: [id number] Attending Physician: [place], Caleb M, MD Primary Care Physician: Caleb Croon, MD Admit Date: 10/28/2017  Reason for Consultation/Follow-up: Establishing goals of care  Subjective: I spoke with the patient's sister Caleb Carpenter.  She is only reasonably available family.  Caleb Carpenter explains that she and Caleb Carpenter are from a family of 13 children.  There are only 6 left.  She is the youngest and has been responsible for making all of the goals of care decisions in the family for her mother and all of her siblings.  "no one steps up.  They expect me to make all of the decisions"   She describes how she has cared for Caleb Carpenter over the years - getting him food stamps, a place to live, his income check.    Caleb Carpenter is firm on her GOC decisions. "If Caleb Carpenter arrests DO NOT resuscitate him, do not put him on life support.  If he dies call me and I will have Caleb Carpenter in [place] come care for him."  She goes on to say, "If he needs Hospice House please send him to [facility] [address].  That is where his brother was".    Caleb Carpenter confirms that Caleb Carpenter lives with her brother in [place] and that if Caleb Carpenter becomes stable enough to go home with hospice, he can return to [employer] [facility].  She asks that we make him comfortable and do what we can for him.  Caleb Carpenter concludes the conversation with a "Thank you" and states that if we need any further advice regarding Caleb Carpenter to call her back.   Assessment: Caleb Carpenter, with advanced cardiac disease and no surgical options.  He is experiencing significant hypotension, not eating, and appears to have little to no insight into his health  status.  Patient Profile/HPI:  62 y.o. male  with past medical history of COPD, HTN, and alcohol use who was admitted on 10/28/2017 with chest pain.  He was found to be positive for STEMI.  He has had a full cardiac work up and was found to have severe coronary artery disease, heart failure with an LVEF of 15% and pulmonary artery pressure of 71 mmhg.  He is quite cachectic and is not a candidate for CABG.  A palliative approach to care has been recommended and PMT was consulted for a goals of care discussion.   Length of Stay: 3  Current Medications: Scheduled Meds:  . aspirin  81 mg Oral Daily  . carvedilol  3.125 mg Oral BID WC  . clopidogrel  75 mg Oral Q breakfast  . feeding supplement (ENSURE ENLIVE)  237 mL Oral BID BM  . folic acid  1 mg Oral Daily  . heparin  5,000 Units Subcutaneous Q8H  . losartan  12.5 mg Oral Daily  . mouth rinse  15 mL Mouth Rinse BID  . multivitamin with minerals  1 tablet Oral Daily  . sodium chloride flush  3 mL Intravenous Q12H  . sucralfate  1 g Oral TID WC & HS  . thiamine  100 mg Oral Daily   Or  . thiamine  100 mg Intravenous Daily  . tiotropium  18 mcg Inhalation Daily  . traZODone  50 mg Oral QHS    Continuous Infusions: . sodium chloride 10 mL/hr at 10/31/17 1000    PRN Meds: sodium chloride, acetaminophen, albuterol, guaiFENesin, LORazepam **OR** LORazepam, ondansetron (ZOFRAN) IV, sodium chloride flush  Physical Exam       Did not examine.  Vital Signs: BP 98/78   Pulse (!) 114   Temp 98.2 F (36.8 C) (Oral)   Resp 19   Ht 5\' 5"  (1.651 Carpenter)   Wt 37.1 kg (81 lb 12.7 oz)   SpO2 98%   BMI 13.61 kg/Carpenter  SpO2: SpO2: 98 % O2 Device: O2 Device: Nasal Cannula O2 Flow Rate: O2 Flow Rate (L/min): 2 L/min  Intake/output summary:   Intake/Output Summary (Last 24 hours) at 10/31/2017 1055 Last data filed at 10/31/2017 1000 Gross per 24 hour  Intake 1021.89 ml  Output 325 ml  Net 696.89 ml   LBM: Last BM Date: 10/30/17 Baseline  Weight: Weight: 39.1 kg (86 lb 3.2 oz) Most recent weight: Weight: 37.1 kg (81 lb 12.7 oz)       Palliative Assessment/Data: 40%      Patient Active Problem List   Diagnosis Date Noted  . Protein-calorie malnutrition, severe 10/30/2017  . Palliative care encounter   . STEMI (ST elevation myocardial infarction) (HCC) 10/28/2017  . ST elevation myocardial infarction involving right coronary artery (HCC) 10/28/2017  . Syncope 04/01/2017  . Pressure injury of skin 04/01/2017  . Fall 02/18/2017  . Hypokalemia 11/06/2016  . Dementia 11/06/2016  . Chronic systolic heart failure (HCC) 11/01/2016  . Dehydration 08/28/2016  . General weakness 09/03/2015  . Generalized weakness 09/02/2015  . Hyponatremia 09/02/2015  . HTN (hypertension) 09/02/2015  . GERD (gastroesophageal reflux disease) 09/02/2015  . Hypomagnesemia 06/23/2015  . Leukocytosis 06/23/2015  . Alcohol abuse 06/23/2015  . Thrombocytopenia (HCC) 06/23/2015  . Reflux esophagitis   . Gastritis     Palliative Care Plan    Recommendations/Plan:  Change code status to DNR per sister Caleb Carpenter ([phone]) who is the only reasonably available family.  No "Life support"  Treat the treatable. "Do what you can for him"  Utilize medications for comfort as needed. "Keep him comfortable"  If patient is able to leave the Carpenter -  home to Caleb Carpenter's house with [facility]  Caleb Carpenter has confirmed this with Caleb Carpenter)  Caleb Carpenter would really like for Caleb Carpenter to come home.  If he does not stabilize, [address] requests [address] on [place]  [address].  If he dies in the Carpenter please call Caleb Carpenter.  She would like to utilize Caleb Carpenter in [place].  Goals of Care and Additional Recommendations:  Limitations on Scope of Treatment: Treat the treatable, but ensure comfort.  Code Status:  DNR  Prognosis:  If BP will stabilize the patient likely has some number of weeks.  If BP does not  stabilize and he is not eating he will decline very quickly and may only have hours to days.  Discharge Planning:  Home with Hospice:   Ideally home with [facility] to Caleb Carpenter's house (Caleb Carpenter brother in [place])  Care plan was discussed with Caleb Carpenter, patient's sister, Caleb Carpenter, and Cardiology PA-C.  Thank you for allowing the Palliative Medicine Team to assist in the care of this patient.  PMT has limited coverage over the weekend.  If we are needed please call [phone].  Otherwise we will check on Mr. Dollins next week.  Thank you!  Total time spent:  35 min.     Greater than 50%  of this time was spent counseling and coordinating care related to the above assessment and plan.  Norvel Richards, PA-C Palliative Medicine  Please contact Palliative MedicineTeam phone at [phone] for questions and concerns between 7 am - 7 pm.   Please see AMION for individual provider pager numbers.

## 2017-10-31 NOTE — Progress Notes (Signed)
Mobility delayed due to patient's increased work of breathing.

## 2017-10-31 NOTE — Progress Notes (Signed)
RN placed pink foam pads on bony prominences (ischial tuberosities and lateral aspects of L/R feet) to prevent skin breakdown.

## 2017-10-31 NOTE — Progress Notes (Signed)
Phlebotomy arrived to draw patient's venous blood gas.

## 2017-11-01 LAB — BASIC METABOLIC PANEL
Anion gap: 7 (ref 5–15)
BUN: 11 mg/dL (ref 8–23)
CHLORIDE: 95 mmol/L — AB (ref 98–111)
CO2: 34 mmol/L — AB (ref 22–32)
CREATININE: 0.54 mg/dL — AB (ref 0.61–1.24)
Calcium: 8.5 mg/dL — ABNORMAL LOW (ref 8.9–10.3)
GFR calc Af Amer: >60 mL/min (ref >60)
GFR calc non Af Amer: >60 mL/min (ref >60)
Glucose, Bld: 109 mg/dL — ABNORMAL HIGH (ref 70–99)
POTASSIUM: 3.9 mmol/L (ref 3.5–5.1)
Sodium: 136 mmol/L (ref 135–145)

## 2017-11-01 NOTE — Progress Notes (Signed)
Progress Note  Patient Name: Caleb Carpenter Date of Encounter: 11/01/2017  Primary Cardiologist: No primary care provider on file.   Subjective   Denies chest pain or shortness of breath.  Inpatient Medications    Scheduled Meds: . aspirin  81 mg Oral Daily  . clopidogrel  75 mg Oral Q breakfast  . feeding supplement (ENSURE ENLIVE)  237 mL Oral BID BM  . folic acid  1 mg Oral Daily  . heparin  5,000 Units Subcutaneous Q8H  . mouth rinse  15 mL Mouth Rinse BID  . metoprolol tartrate  12.5 mg Oral BID  . multivitamin with minerals  1 tablet Oral Daily  . sodium chloride flush  3 mL Intravenous Q12H  . sucralfate  1 g Oral TID WC & HS  . thiamine  100 mg Oral Daily   Or  . thiamine  100 mg Intravenous Daily  . tiotropium  18 mcg Inhalation Daily  . traZODone  50 mg Oral QHS   Continuous Infusions: . sodium chloride Stopped (10/31/17 1114)   PRN Meds: sodium chloride, acetaminophen, albuterol, guaiFENesin, ondansetron (ZOFRAN) IV, sodium chloride flush   Vital Signs    Vitals:   11/01/17 0741 11/01/17 0800 11/01/17 0831 11/01/17 0900  BP: 106/85   92/73  Pulse: (!) 102 (!) 102 (!) 102 87  Resp: 18 20 20  (!) 24  Temp:   (!) 97.4 F (36.3 C)   TempSrc:   Oral   SpO2: 100% 100% 100% 100%  Weight:      Height:        Intake/Output Summary (Last 24 hours) at 11/01/2017 1048 Last data filed at 11/01/2017 0900 Gross per 24 hour  Intake 320.83 ml  Output 425 ml  Net -104.17 ml   Filed Weights   10/28/17 1345 10/30/17 0600  Weight: 86 lb 3.2 oz (39.1 kg) 81 lb 12.7 oz (37.1 kg)    Telemetry    Normal sinus rhythm- Personally Reviewed  ECG    None- Personally Reviewed  Physical Exam   GEN:  Cachectic, frail-appearing, no acute distress.   Neck:  7 cm JVD Cardiac: RRR, no murmurs, rubs, or gallops.  Respiratory: Clear to auscultation bilaterally. GI: Soft, nontender, non-distended  MS: No edema; No deformity. Neuro:  Nonfocal  Psych: Normal affect    Labs    Chemistry Recent Labs  Lab 10/26/17 1827 10/28/17 1052  10/30/17 0844 10/31/17 0325 11/01/17 0224  NA 138 136   < > 139 141 136  K 4.2 4.8   < > 3.2* 3.9 3.9  CL 106 97*   < > 97* 103 95*  CO2 24 25   < > 31 33* 34*  GLUCOSE 97 146*   < > 76 98 109*  BUN <5* 9   < > 5* 5* 11  CREATININE 0.56* 1.10   < > 0.64 0.62 0.54*  CALCIUM 7.7* 8.6*   < > 8.1* 8.3* 8.5*  PROT 5.2* 5.8*  --  4.4*  --   --   ALBUMIN 2.5* 2.9*  --  2.3*  --   --   AST 26 177*  --  270*  --   --   ALT 8 24  --  62*  --   --   ALKPHOS 42 49  --  39  --   --   BILITOT 0.4 1.0  --  0.9  --   --   GFRNONAA >60 >60   < > >  60 >60 >60  GFRAA >60 >60   < > >60 >60 >60  ANIONGAP 8 14   < > 11 5 7    < > = values in this interval not displayed.     Hematology Recent Labs  Lab 10/29/17 0722 10/30/17 0844 10/31/17 0325  WBC 19.5* 9.8 6.9  RBC 3.60* 3.13* 3.13*  HGB 11.8* 10.2* 9.9*  HCT 37.8* 32.0* 32.2*  MCV 105.0* 102.2* 102.9*  MCH 32.8 32.6 31.6  MCHC 31.2 31.9 30.7  RDW 16.1* 15.5 15.8*  PLT 270 165 157    Cardiac Enzymes Recent Labs  Lab 10/26/17 1827 10/26/17 2106 10/29/17 0722  TROPONINI <0.03 <0.03 0.71*    Recent Labs  Lab 10/28/17 1106  TROPIPOC 0.81*     BNP Recent Labs  Lab 10/29/17 0702  BNP 3,014.6*     DDimer No results for input(s): DDIMER in the last 168 hours.   Radiology    No results found.  Cardiac Studies   None  Patient Profile     62 y.o. male admitted with acute inferior MI, with severe three-vessel disease.  Severe cachexia and multiple other comorbidities make the patient not a candidate for bypass surgery or percutaneous intervention.  Assessment & Plan    1.  Coronary artery disease status post STEMI -see above.  He is currently pain-free.  We will ask palliative care to provide US guidance for post discharge care.  We will transfer to telemetry. 2.  Hospice care -appreciate the palliative care service recommendations.  We will attempt  to get him to his family's residence with outpatient hospice follow-up.  For questions or updates, please contact [facility] Please consult [email or web address] for contact info under Cardiology/STEMI.      Signed, Lewayne Bunting, MD  11/01/2017, 10:48 AM  Patient ID: Caleb Carpenter, male   DOB: 1955/05/11, 62 y.o.   MRN: [id number]

## 2017-11-02 MED ORDER — HYPROMELLOSE (GONIOSCOPIC) 2.5 % OP SOLN
1.0000 [drp] | Freq: Three times a day (TID) | OPHTHALMIC | Status: DC | PRN
  Administered 2017-11-02: 1 [drp] via OPHTHALMIC
  Filled 2017-11-02: qty 15

## 2017-11-02 NOTE — Clinical Social Work Note (Signed)
Per pallative note pt will d/c home with hospice. Clinical Social Worker will sign off for now as social work intervention is no longer needed. Please consult [place] again if new need arises.   Caleb Carpenter [name] 11/02/2017

## 2017-11-02 NOTE — Progress Notes (Signed)
Progress Note  Patient Name: Caleb Carpenter Date of Encounter: 11/02/2017  Primary Cardiologist: No primary care provider on file.   Subjective   No chest pain or shortness of breath.  His appetite is down  Inpatient Medications    Scheduled Meds: . aspirin  81 mg Oral Daily  . clopidogrel  75 mg Oral Q breakfast  . feeding supplement (ENSURE ENLIVE)  237 mL Oral BID BM  . folic acid  1 mg Oral Daily  . heparin  5,000 Units Subcutaneous Q8H  . mouth rinse  15 mL Mouth Rinse BID  . metoprolol tartrate  12.5 mg Oral BID  . multivitamin with minerals  1 tablet Oral Daily  . sodium chloride flush  3 mL Intravenous Q12H  . sucralfate  1 g Oral TID WC & HS  . thiamine  100 mg Oral Daily   Or  . thiamine  100 mg Intravenous Daily  . tiotropium  18 mcg Inhalation Daily  . traZODone  50 mg Oral QHS   Continuous Infusions: . sodium chloride Stopped (10/31/17 1114)   PRN Meds: sodium chloride, acetaminophen, albuterol, guaiFENesin, ondansetron (ZOFRAN) IV, sodium chloride flush   Vital Signs    Vitals:   11/02/17 0800 11/02/17 0801 11/02/17 0810 11/02/17 0825  BP: (!) 113/92   (!) 113/92  Pulse: (!) 103  96 98  Resp: 18  16 19   Temp:  98.5 F (36.9 C)    TempSrc:  Oral    SpO2: 90%  100% 100%  Weight:      Height:        Intake/Output Summary (Last 24 hours) at 11/02/2017 0925 Last data filed at 11/02/2017 0800 Gross per 24 hour  Intake 915 ml  Output 500 ml  Net 415 ml   Filed Weights   10/28/17 1345 10/30/17 0600  Weight: 86 lb 3.2 oz (39.1 kg) 81 lb 12.7 oz (37.1 kg)    Telemetry    Normal sinus rhythm- Personally Reviewed  ECG    None- Personally Reviewed  Physical Exam   GEN:  Frail, acute distress.   Neck:  6 cm JVD Cardiac: RRR, no murmurs, rubs, or gallops.  Respiratory: Clear to auscultation bilaterally. GI: Soft, nontender, non-distended  MS: No edema; No deformity. Neuro:  Nonfocal  Psych: Normal affect   Labs    Chemistry Recent Labs   Lab 10/26/17 1827 10/28/17 1052  10/30/17 0844 10/31/17 0325 11/01/17 0224  NA 138 136   < > 139 141 136  K 4.2 4.8   < > 3.2* 3.9 3.9  CL 106 97*   < > 97* 103 95*  CO2 24 25   < > 31 33* 34*  GLUCOSE 97 146*   < > 76 98 109*  BUN <5* 9   < > 5* 5* 11  CREATININE 0.56* 1.10   < > 0.64 0.62 0.54*  CALCIUM 7.7* 8.6*   < > 8.1* 8.3* 8.5*  PROT 5.2* 5.8*  --  4.4*  --   --   ALBUMIN 2.5* 2.9*  --  2.3*  --   --   AST 26 177*  --  270*  --   --   ALT 8 24  --  62*  --   --   ALKPHOS 42 49  --  39  --   --   BILITOT 0.4 1.0  --  0.9  --   --   GFRNONAA >60 >60   < > >  60 >60 >60  GFRAA >60 >60   < > >60 >60 >60  ANIONGAP 8 14   < > 11 5 7    < > = values in this interval not displayed.     Hematology Recent Labs  Lab 10/29/17 0722 10/30/17 0844 10/31/17 0325  WBC 19.5* 9.8 6.9  RBC 3.60* 3.13* 3.13*  HGB 11.8* 10.2* 9.9*  HCT 37.8* 32.0* 32.2*  MCV 105.0* 102.2* 102.9*  MCH 32.8 32.6 31.6  MCHC 31.2 31.9 30.7  RDW 16.1* 15.5 15.8*  PLT 270 165 157    Cardiac Enzymes Recent Labs  Lab 10/26/17 1827 10/26/17 2106 10/29/17 0722  TROPONINI <0.03 <0.03 0.71*    Recent Labs  Lab 10/28/17 1106  TROPIPOC 0.81*     BNP Recent Labs  Lab 10/29/17 0702  BNP 3,014.6*     DDimer No results for input(s): DDIMER in the last 168 hours.   Radiology    No results found.  Cardiac Studies   None  Patient Profile     62 y.o. male admitted with an acute inferior MI, severe three-vessel disease, not a candidate for revascularization, hospice has been called.  Assessment & Plan    1.  Coronary artery disease status post STEMI -he is pain-free.  Will transfer to telemetry. 2.  Hospice care -we will attempt to place the patient to his family's residence tomorrow.  He is otherwise stable for discharge.  Long-term prognosis is very poor.  For questions or updates, please contact [facility] Please consult [email or web address] for contact info under Cardiology/STEMI.       Signed, Caleb Bunting, MD  11/02/2017, 9:25 AM  Patient ID: Caleb Carpenter, male   DOB: 1956/04/14, 62 y.o.   MRN: [id number]

## 2017-11-03 ENCOUNTER — Encounter (HOSPITAL_COMMUNITY): Payer: Self-pay | Admitting: *Deleted

## 2017-11-03 DIAGNOSIS — I255 Ischemic cardiomyopathy: Secondary | ICD-10-CM | POA: Diagnosis present

## 2017-11-03 DIAGNOSIS — I2510 Atherosclerotic heart disease of native coronary artery without angina pectoris: Secondary | ICD-10-CM | POA: Diagnosis present

## 2017-11-03 DIAGNOSIS — Z66 Do not resuscitate: Secondary | ICD-10-CM | POA: Diagnosis not present

## 2017-11-03 DIAGNOSIS — I25110 Atherosclerotic heart disease of native coronary artery with unstable angina pectoris: Secondary | ICD-10-CM

## 2017-11-03 MED ORDER — CLOPIDOGREL BISULFATE 75 MG PO TABS: 75.0000 mg | ORAL_TABLET | Freq: Every day | ORAL | 11 refills | Status: DC

## 2017-11-03 MED ORDER — NITROGLYCERIN 0.4 MG SL SUBL: 0.4000 mg | SUBLINGUAL_TABLET | SUBLINGUAL | 2 refills | Status: DC | PRN

## 2017-11-03 MED ORDER — NITROGLYCERIN 0.4 MG SL SUBL: 0.4000 mg | SUBLINGUAL_TABLET | SUBLINGUAL | Status: DC | PRN

## 2017-11-03 MED ORDER — METOPROLOL TARTRATE 25 MG PO TABS: 12.5000 mg | ORAL_TABLET | Freq: Two times a day (BID) | ORAL | 11 refills | Status: DC

## 2017-11-03 MED ORDER — FOLIC ACID 1 MG PO TABS: 1.0000 mg | ORAL_TABLET | Freq: Every day | ORAL | 11 refills | Status: DC

## 2017-11-03 MED ORDER — ACETAMINOPHEN 325 MG PO TABS: 650.0000 mg | ORAL_TABLET | ORAL | Status: DC | PRN

## 2017-11-03 NOTE — Progress Notes (Addendum)
Just received a call from the hospice nurse liaison, Renea Ee, from hospice of  and she expressed that the patient cannot be discharged home tonight because the "friend" that the patient has been staying with for a year stated to the liaison that he thought the patient was going to a hospice facility. The friend said he no longer has the means to take care of this patient at his house. The liaison said she will call tomorrow to speak with the case manager to work out a plan since Case management is no longer at the hospital at this time. I paged cardiology on-call to inform them of this, awaiting call back. PTAR cancelled at this time.

## 2017-11-03 NOTE — Progress Notes (Signed)
Received a call from patient's friend Adriana Mccallum- whom patient has been living with for the past 1 year.  He expressed that he thought the patient was discharging to the hospice home- not to his home.  He also states that he is only a friend- and not a family member and that his sister is in charge of his care and decisions.  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 Greeney needs at home with his declining.  He expressed that he has provided Baldo Ash a home, and he was happy to do so- but he can't provide care for him.  He said after talking to his family this evening, after delivery of the oxygen- he realized the patient was coming to  His home- and not the hospice home.  I called and spoke with the patient's nurse, Ginger to notify her of the above and request to cancel EMS transport as there is no caregiver nor home for this patient to discharge to tonight.  We will follow up tomorrow with case management to work out another discharge plan.  I apologize for any inconvenience.  Norris Cross, MA, BSN, RN, FNE Clinical Nurse Liaison [facility] [place] [phone]

## 2017-11-03 NOTE — Progress Notes (Signed)
Occupational Therapy Treatment Patient Details Name: Caleb Carpenter MRN: [id number] DOB: 27-Dec-1955 Today's Date: 11/03/2017    History of present illness This 62 y.o. male admitted with substernal CP.  EKG showed STEMI.   PMH includes:  COPD, HNT, ETOH abuse, HTN, asthma, thrombocytopenia, dementia   OT comments  Pt demonstrating impulsivity today during OT session as well as shortness of breath with decrease in SpO2 to ultimately 84% improving to 90% with 2L O2 applied by RN. Pt able to complete multiple stand-pivot transfers to and from [facility] this session with min assist and required min assist for standing toileting hygiene today. Pt additionally limited by HR up to 131 while seated on BSC. Note plan to D/C home with hospice this session. Feel that pt will need 24 hour hands on assistance at home and would benefit from home health therapy follow-up if desired. OT will continue to follow while admitted.    Follow Up Recommendations  Supervision/Assistance - 24 hour;Home health OT    Equipment Recommendations  3 in 1 bedside commode    Recommendations for Other Services      Precautions / Restrictions Precautions Precautions: Fall Restrictions Weight Bearing Restrictions: No RLE Weight Bearing: Weight bearing as tolerated       Mobility Bed Mobility Overal bed mobility: Needs Assistance Bed Mobility: Supine to Sit     Supine to sit: Min assist     General bed mobility comments: Using momentum to sit up. Requiring assistance to raise trunk from [facility].   Transfers Overall transfer level: Needs assistance Equipment used: 1 person hand held assist Transfers: Sit to/from [employer] Sit to Stand: Min assist Stand pivot transfers: Min assist       General transfer comment: Min assist for stability.     Balance Overall balance assessment: Needs assistance Sitting-balance support: Feet supported Sitting balance-Leahy Scale: Fair     Standing balance  support: Single extremity supported Standing balance-Leahy Scale: Poor Standing balance comment: min A for balance, and UE support                            ADL either performed or assessed with clinical judgement   ADL Overall ADL's : Needs assistance/impaired                         Toilet Transfer: Minimal assistance;Stand-pivot(completing twice this session)   Toileting- Clothing Manipulation and Hygiene: Minimal assistance;Sit to/from stand Toileting - Clothing Manipulation Details (indicate cue type and reason): assist for balance and to clean thoroughly     Functional mobility during ADLs: Minimal assistance(stand-pivot transfer only) General ADL Comments: Pt impulsive and perseverating on needing to use the bathroom. Also stating "I feel like I am going to die." Noted shortness of breath. Pt with SpO2 down to 86% on RA and notified RN who came to apply supplemental O2 via [place]. Pt then demonstrating decrease to 84% but improving to 90% on 2L O2 via [place]. HR up to 131 with sitting on BSC and informed pt of need to return to bed due to medical status. However, pt continued to decliner transfer back to bed and unsafely returning to [facility]. Eventually able to convince pt to return to bed.      Vision       Perception     Praxis      Cognition Arousal/Alertness: Awake/alert Behavior During Therapy: [facility] for tasks assessed/performed;Flat affect Overall  Cognitive Status: No family/caregiver present to determine baseline cognitive functioning                                 General Comments: Pt with poor awareness of safety and decreased ability to follow commands. Pre-occupied over needing to have a bowel movement; however unable to attend to education concerning medical status and need to return to bed due to SpO2 and HR.         Exercises     Shoulder Instructions       General Comments      Pertinent Vitals/ Pain       Pain Assessment:  No/denies pain  Home Living                                          Prior Functioning/Environment              Frequency  Min 2X/week        Progress Toward Goals  OT Goals(current goals can now be found in the care plan section)  Progress towards OT goals: Progressing toward goals  Acute Rehab OT Goals Patient Stated Goal: "to go home"  OT Goal Formulation: With patient Time For Goal Achievement: 11/13/17 Potential to Achieve Goals: Fair  Plan Discharge plan needs to be updated    Co-evaluation                 AM-PAC PT "6 Clicks" Daily Activity     Outcome Measure   Help from another person eating meals?: A Little Help from another person taking care of personal grooming?: A Little Help from another person toileting, which includes using toliet, bedpan, or urinal?: A Little Help from another person bathing (including washing, rinsing, drying)?: A Little Help from another person to put on and taking off regular upper body clothing?: A Little Help from another person to put on and taking off regular lower body clothing?: A Little 6 Click Score: 18    End of Session Equipment Utilized During Treatment: Oxygen  OT Visit Diagnosis: Unsteadiness on feet (R26.81)   Activity Tolerance Patient limited by fatigue   Patient Left in chair;with call bell/phone within reach;with chair alarm set   Nurse Communication Mobility status        Time: 1610-9604 OT Time Calculation (min): 32 min  Charges: OT General Charges $OT Visit: 1 Visit OT Treatments $Self Care/Home Management : 23-37 mins  Doristine Section, MS OTR/L  Pager: [phone]    Aaryn Parrilla A Benson Porcaro 11/03/2017, 4:29 PM

## 2017-11-03 NOTE — Discharge Summary (Addendum)
Discharge Summary    Patient ID: Caleb Carpenter,  MRN: [id number], DOB/AGE: 64-10-1955 64 y.o.  Admit date: 10/28/2017 Discharge date: 11/10/2017  Primary Care Provider: Evelene Croon Primary Cardiologist: Dr [place]  Discharge Diagnoses    Principal Problem:   Evaluation by psychiatric service required Active Problems:   Alcohol abuse   HTN (hypertension)   General weakness   Chronic systolic heart failure (HCC)   STEMI (ST elevation myocardial infarction) [facility])   Palliative care encounter   Protein-calorie malnutrition, severe   CAD (coronary artery disease)   DNR (do not resuscitate)   Ischemic cardiomyopathy   Allergies Allergies  Allergen Reactions  . Codeine Other (See Comments)    ams    Diagnostic Studies/Procedures    Coronary angiogram 10/28/17 Echo 10/28/17 _____________   History of Present Illness     62 yo male with PMH of HTN, COPD, asthma, chewing tobacco use, ETOH abuse and chronic diastolic HF. He is followed by his PCP for chronic illnesses. Had an echo done back in 6/18 that showed an EF of 30-35% with mild MR and moderate TR with severely elevated PA pressures of . He has had multiple trips to the ED in review of notes. Has been on BB and ACEi as an outpatient. Drinks heavily. Was at [facility] on 7/14 with complaints of chest pain, cough and ETOH use. EKG was nonischemic and troponins negative. Was discharged home.   On the morning of 10/28/17, he developed worsening centralized chest pain, back pain and cough. Friend called EMS. On EMS arrival his sats were 90% on RA. He was transported to [facility] for further evaluation. Sent to triage on arrival to [facility], there EKG was done in triage and showed acute ST elevation in inferior and anterior leads. Code STEMI was called. Given 324mg  ASA and 2000 units of heparin. Brought to the cath lab for emergent cardiac cath.     Hospital Course     Consultants: Palliative care; Psychiatry  62 yo male with  PMH of HTN, COPD, asthma, ETOH abuse, and chronic CHF. He is followed by his PCP for chronic illnesses. Had an echo in 6/18 that showed an EF of 30-35% with mild MR and moderate TR with severely elevated PA pressures of . He has had multiple trips to the ED in review of notes. Has been on BB and ACEi as an outpatient. He drinks heavily. Was at [facility] on 10/26/17 with complaints of chest pain, cough and ETOH use. EKG was nonischemic and troponins negative and he was discharged home.   On 10/28/17 he developed worsening centralized chest pain, back pain and cough. A friend called EMS. On EMS arrival his sats were 90% on RA. He was transported to [facility] for further evaluation.  EKG done in triage showed acute ST elevation in inferior and anterior leads. Code STEMI was called. Brought to the cath lab for emergent cardiac cath.  Cath revealed severe 3 vessel obstructive CAD. Vessels were noted to be diffusely diseased and severely calcified. He had severe LV dysfunction with diffuse akinesia- EF 15%. After review it was felt that the patient was chronically ill with severe cachexia, COPD, and a history of chronic Etoh. He was not a candidate for CABG. his vessels were poorly suited for PCI and there was concern that attempt at PCI would only make his condition worse. It was felt his prognosis was extremely poor and that he would be best managed with medical therapy and a Palliative  care consult was recomended. Palliative care followed throughout admission and noted improvement in his condition, to the point that they did not believe he was an inpatient hospice candidate. Given difficult home situation, he was ultimately recommended to go to a SNF at discharge. Psychiatry evaluated the patient and deemed him to have the capacity to consent to SNF placement. SW assisted with SNF placement and a bed was available on 11/10/17.  1. CAD: patient found to have severe 3 vessel disease not amenable to stenting and not a  surgical candidate for CABG. Medical management recommended. - Continue ASA, plavix, statin, and metoprolol  2. Ischemic cardiomyopathy: Echo revealed EF 15% this admission.  - Continue metoprolol succinate and losartan  3. Poor prognosis: seen by Palliative care in the hospital - Continue hospice services at SNF      _____________  Discharge Vitals Blood pressure 100/65, pulse 83, temperature 98.6 F (37 C), temperature source Oral, resp. rate 18, height 5\' 5"  (1.651 m), weight 106 lb 7.7 oz (48.3 kg), SpO2 94 %.  Filed Weights   11/07/17 0457 11/08/17 0539 11/10/17 0513  Weight: 107 lb 5.8 oz (48.7 kg) 104 lb 15 oz (47.6 kg) 106 lb 7.7 oz (48.3 kg)    Labs & Radiologic Studies    CBC No results for input(s): WBC, NEUTROABS, HGB, HCT, MCV, PLT in the last 72 hours. Basic Metabolic Panel No results for input(s): NA, K, CL, CO2, GLUCOSE, BUN, CREATININE, CALCIUM, MG, PHOS in the last 72 hours. Liver Function Tests No results for input(s): AST, ALT, ALKPHOS, BILITOT, PROT, ALBUMIN in the last 72 hours. No results for input(s): LIPASE, AMYLASE in the last 72 hours. Cardiac Enzymes No results for input(s): CKTOTAL, CKMB, CKMBINDEX, TROPONINI in the last 72 hours. BNP Invalid input(s): POCBNP D-Dimer No results for input(s): DDIMER in the last 72 hours. Hemoglobin A1C No results for input(s): HGBA1C in the last 72 hours. Fasting Lipid Panel No results for input(s): CHOL, HDL, LDLCALC, TRIG, CHOLHDL, LDLDIRECT in the last 72 hours. Thyroid Function Tests No results for input(s): TSH, T4TOTAL, T3FREE, THYROIDAB in the last 72 hours.  Invalid input(s): FREET3 _____________  Dg Chest 2 View  Result Date: 10/26/2017 CLINICAL DATA:  Pt arrived via ems for c/o chest pain - the pain started around 7am and has not gotten better - he reports shortness of breath but denies N/V - he reports the pain is in the left side of his chest and radiates around left side and into back EXAM:  CHEST - 2 VIEW COMPARISON:  Chest CTA and chest radiographs, 04/30/2017. FINDINGS: Cardiac silhouette is normal in size. No mediastinal or hilar masses. No evidence of adenopathy. Hyperexpanded lungs. Prominent bronchovascular markings most evident in the bases. Mild lung base scarring. No evidence of pneumonia or pulmonary edema. No pleural effusion or pneumothorax. Multiple old rib fractures.  Old right proximal humerus fracture. IMPRESSION: 1. No acute cardiopulmonary disease. 2. COPD. Electronically Signed   By: Amie Portland M.D.   On: 10/26/2017 18:59   Dg Chest Port 1 View  Result Date: 10/29/2017 CLINICAL DATA:  Shortness of breath.  COPD. EXAM: PORTABLE CHEST 1 VIEW COMPARISON:  10/28/2017 FINDINGS: Heart size remains within normal limits.  Aortic atherosclerosis. Pulmonary emphysema again demonstrated. Increased opacity is seen in both lung bases, which may be due to superimposed pulmonary edema or infection. New tiny right pleural effusion is seen. IMPRESSION: Increased bilateral lower lung opacity superimposed on severe emphysema. This is suspicious for superimposed pulmonary edema  or infection. New tiny right pleural effusion. Electronically Signed   By: Myles Rosenthal M.D.   On: 10/29/2017 10:52   Dg Chest Port 1 View  Result Date: 10/28/2017 CLINICAL DATA:  STEMI. Dry cough. Shortness of breath. Chest pain. EXAM: PORTABLE CHEST 1 VIEW COMPARISON:  10/26/2017, 04/30/2017.  CT 04/30/2017. FINDINGS: Mediastinum and hilar structures are normal. COPD. Bilateral pleural-parenchymal thickening again noted consistent with scarring. Reference is made to recent CT report of 04/30/2017 for discussion of pulmonary nodules noted. Tiny left pleural effusion. No pneumothorax. Borderline cardiomegaly with mild pulmonary venous congestion. Carotid vascular and bilateral upper extremity peripheral vascular calcification. Old right non healed femoral neck fracture. IMPRESSION: 1. COPD and pleuroparenchymal  scarring. Small left pleural effusion cannot be excluded. 2.  Cardiomegaly with mild pulmonary venous congestion. 3. Carotid and bilateral upper extremity peripheral vascular disease. Electronically Signed   By: Maisie Fus  Register   On: 10/28/2017 11:22   Disposition   Pt is being discharged home today in good condition.  Follow-up Plans & Appointments    Follow-up Information    Evelene Croon, MD Follow up.   Specialty:  Family Medicine Why:  Call office for follow up Contact information: [facility] [place] [address] [phone]        [facility] Follow up.   Specialty:  Hospice and Palliative Medicine Why:  Registered Nurse- Staff to Assess for Durable Medical Equipment for Home.  Contact information: [address] [place] [address] [phone]        Delma Freeze, FNP Follow up on 12/08/2017.   Specialty:  Family Medicine Why:  Please arrive 15 minutes early for your 4:00pm cardiology appointment Contact information: [address] [place] [address] [phone]          Discharge Instructions    Diet - low sodium heart healthy   Complete by:  As directed    Increase activity slowly   Complete by:  As directed       Discharge Medications   Allergies as of 11/10/2017      Reactions   Codeine Other (See Comments)   ams      Medication List    STOP taking these medications   alendronate 70 MG tablet Commonly known as:  FOSAMAX   buPROPion 150 MG 12 hr tablet Commonly known as:  WELLBUTRIN SR   carvedilol 3.125 MG tablet Commonly known as:  COREG   donepezil 5 MG tablet Commonly known as:  ARICEPT   meloxicam 7.5 MG tablet Commonly known as:  MOBIC   sucralfate 1 g tablet Commonly known as:  CARAFATE     TAKE these medications   acetaminophen 325 MG tablet Commonly known as:  TYLENOL Take 2 tablets (650 mg total) by mouth every 4 (four) hours as needed for headache or mild  pain.   albuterol 108 (90 Base) MCG/ACT inhaler Commonly known as:  PROVENTIL HFA;VENTOLIN HFA Inhale 2 puffs into the lungs every 4 (four) hours as needed for wheezing.   aspirin 81 MG chewable tablet Chew 1 tablet (81 mg total) by mouth daily.   atorvastatin 40 MG tablet Commonly known as:  LIPITOR Take 1 tablet (40 mg total) by mouth daily.   clopidogrel 75 MG tablet Commonly known as:  PLAVIX Take 1 tablet (75 mg total) by mouth daily with breakfast.   fluticasone 50 MCG/ACT nasal spray Commonly known as:  FLONASE Place 2 sprays into both nostrils daily.  folic acid 1 MG tablet Commonly known as:  FOLVITE Take 1 tablet (1 mg total) by mouth daily.   losartan 25 MG tablet Commonly known as:  COZAAR Take 1 tablet (25 mg total) by mouth daily. Start taking on:  11/11/2017   metoprolol succinate 50 MG 24 hr tablet Commonly known as:  TOPROL XL Take 1 tablet (50 mg total) by mouth daily. Take with or immediately following a meal.   multivitamin with minerals Tabs tablet Take 1 tablet by mouth daily.   nitroGLYCERIN 0.4 MG SL tablet Commonly known as:  NITROSTAT Place 1 tablet (0.4 mg total) under the tongue every 5 (five) minutes as needed for chest pain.   STIOLTO RESPIMAT 2.5-2.5 MCG/ACT Aers Generic drug:  Tiotropium Bromide-Olodaterol Inhale 2 puffs into the lungs daily.   thiamine 100 MG tablet Take 100 mg by mouth daily.   traZODone 50 MG tablet Commonly known as:  DESYREL Take 50 mg by mouth at bedtime.        Acute coronary syndrome (MI, NSTEMI, STEMI, etc) this admission?: Yes.     AHA/ACC Clinical Performance & Quality Measures: 1. Aspirin prescribed? - Yes 2. ADP Receptor Inhibitor (Plavix/Clopidogrel, Brilinta/Ticagrelor or Effient/Prasugrel) prescribed (includes medically managed patients)? - Yes 3. Beta Blocker prescribed? - Yes 4. High Intensity Statin (Lipitor 40-80mg  or Crestor 20-40mg ) prescribed? - No - Hospice Care 5. EF assessed during  THIS hospitalization? - Yes 6. For EF <40%, was ACEI/ARB prescribed? - Yes 7. For EF <40%, Aldosterone Antagonist (Spironolactone or Eplerenone) prescribed? - No - Reason:  low B/P Hospice care 8. Cardiac Rehab Phase II ordered (Included Medically managed Patients)? - No     Outstanding Labs/Studies    Duration of Discharge Encounter   Greater than 30 minutes including physician time.  Signed, Beatriz Stallion, PA-C 11/10/2017, 1:46 PM

## 2017-11-03 NOTE — Care Management Note (Addendum)
Case Management Note  Patient Details  Name: Caleb Carpenter MRN: [id number] Date of Birth: 07/17/1955  Subjective/Objective: Pt presented for Chest Pain -Stemi. Pt with hx of Chronic CHF. PTA from home with a friend Lysbeth Galas @ [phone]. Patient did ask CM to call [name] to offer agency choice.                    Action/Plan: [name] chose [facility]. Referral called to [facility] and referral accepted by agency. Office to call patient with time to visit. Pt has DME Rollator at home. CM did state that pt will need DME WC. Information provided to agency-Agency to assess the need for additional DME. CM to set up transportation for home via ambulance. No further needs from CM at this time.   Expected Discharge Date:  11/03/17               Expected Discharge Plan:  Home w Hospice Care  In-House Referral:  Hospice / Palliative Care  Discharge planning Services  CM Consult  Post Acute Care Choice:  Hospice Choice offered to:  Patient, Sibling  DME Arranged:  N/A DME Agency:  NA  HH Arranged:  RN HH Agency:  [facility]  Status of Service:  Completed, signed off  If discussed at Long Length of Stay Meetings, dates discussed:    Additional Comments: 1425 11-10-17 Tomi Bamberger, RN,BSN Case Manager [phone] Patient will transition to [facility] today. CSW assisting with disposition needs. No further needs from CM at this time.    1400 11-04-17 Late Entry Tomi Bamberger, RN, BSN Case Manager [phone]  CM received notice from [facility] this am in regards to Caregivers Family states that Caregiver Bethann Berkshire will not be able to care for the patient and this is why transportation was cancelled for transport home 11-04-27. CSW was asked to follow up for possible SNF with Hospice vs Palliative Services. CM will continue to monitor as well.     1445 11-03-17 11-03-17 Tomi Bamberger, RN,BSN  [phone] CM did call [facility] for ambulance pick up at 5:00 pm. Pt and Caregiver aware of ambulance transport time. CM did make caregiver aware that at 5:00 pm there may be a delay- feel free to contact the unit if so. CM did ask RN staff to get signed DNR for transport. No further needs from CM at this time.  Gala Lewandowsky, RN 11/03/2017, 2:12 PM

## 2017-11-03 NOTE — Progress Notes (Signed)
Progress Note  Patient Name: Caleb Carpenter Date of Encounter: 11/03/2017  Primary Cardiologist: [place]  Subjective   No chest pain  Inpatient Medications    Scheduled Meds: . aspirin  81 mg Oral Daily  . clopidogrel  75 mg Oral Q breakfast  . feeding supplement (ENSURE ENLIVE)  237 mL Oral BID BM  . folic acid  1 mg Oral Daily  . heparin  5,000 Units Subcutaneous Q8H  . mouth rinse  15 mL Mouth Rinse BID  . metoprolol tartrate  12.5 mg Oral BID  . multivitamin with minerals  1 tablet Oral Daily  . sodium chloride flush  3 mL Intravenous Q12H  . sucralfate  1 g Oral TID WC & HS  . thiamine  100 mg Oral Daily   Or  . thiamine  100 mg Intravenous Daily  . tiotropium  18 mcg Inhalation Daily  . traZODone  50 mg Oral QHS   Continuous Infusions: . sodium chloride Stopped (10/31/17 1114)   PRN Meds: sodium chloride, acetaminophen, albuterol, guaiFENesin, hydroxypropyl methylcellulose / hypromellose, ondansetron (ZOFRAN) IV, sodium chloride flush   Vital Signs    Vitals:   11/02/17 2100 11/02/17 2203 11/03/17 0521 11/03/17 0847  BP: 93/77 110/84  108/86  Pulse: 92 92 89 96  Resp: 15 (!) 21    Temp:  98.4 F (36.9 C) 98.5 F (36.9 C)   TempSrc:  Oral    SpO2: 95%  93%   Weight:  108 lb 7.5 oz (49.2 kg) 103 lb 13.4 oz (47.1 kg)   Height:  5\' 5"  (1.651 m)      Intake/Output Summary (Last 24 hours) at 11/03/2017 1009 Last data filed at 11/03/2017 0600 Gross per 24 hour  Intake 495 ml  Output 450 ml  Net 45 ml   Filed Weights   10/30/17 0600 11/02/17 2203 11/03/17 0521  Weight: 81 lb 12.7 oz (37.1 kg) 108 lb 7.5 oz (49.2 kg) 103 lb 13.4 oz (47.1 kg)    Telemetry    Sinus - Personally Reviewed  ECG    No AM EKG - Personally Reviewed  Physical Exam   GEN: Thin cachectic male. NAD Neck: No JVD Cardiac: RRR, no murmurs, rubs, or gallops.  Respiratory: Clear to auscultation bilaterally. GI: Soft, nontender, non-distended  MS: No edema; No deformity. Neuro:   Nonfocal  Psych: Normal affect   Labs    Chemistry Recent Labs  Lab 10/28/17 1052  10/30/17 0844 10/31/17 0325 11/01/17 0224  NA 136   < > 139 141 136  K 4.8   < > 3.2* 3.9 3.9  CL 97*   < > 97* 103 95*  CO2 25   < > 31 33* 34*  GLUCOSE 146*   < > 76 98 109*  BUN 9   < > 5* 5* 11  CREATININE 1.10   < > 0.64 0.62 0.54*  CALCIUM 8.6*   < > 8.1* 8.3* 8.5*  PROT 5.8*  --  4.4*  --   --   ALBUMIN 2.9*  --  2.3*  --   --   AST 177*  --  270*  --   --   ALT 24  --  62*  --   --   ALKPHOS 49  --  39  --   --   BILITOT 1.0  --  0.9  --   --   GFRNONAA >60   < > >60 >60 >60  GFRAA >60   < > >  60 >60 >60  ANIONGAP 14   < > 11 5 7    < > = values in this interval not displayed.     Hematology Recent Labs  Lab 10/29/17 0722 10/30/17 0844 10/31/17 0325  WBC 19.5* 9.8 6.9  RBC 3.60* 3.13* 3.13*  HGB 11.8* 10.2* 9.9*  HCT 37.8* 32.0* 32.2*  MCV 105.0* 102.2* 102.9*  MCH 32.8 32.6 31.6  MCHC 31.2 31.9 30.7  RDW 16.1* 15.5 15.8*  PLT 270 165 157    Cardiac Enzymes Recent Labs  Lab 10/29/17 0722  TROPONINI 0.71*    Recent Labs  Lab 10/28/17 1106  TROPIPOC 0.81*     BNP Recent Labs  Lab 10/29/17 0702  BNP 3,014.6*     DDimer No results for input(s): DDIMER in the last 168 hours.   Radiology    No results found.  Cardiac Studies   Cath: 10/28/17   Prox LAD lesion is 75% stenosed.  Prox Cx to Mid Cx lesion is 90% stenosed.  Prox RCA to Mid RCA lesion is 90% stenosed.  Ost RPDA to RPDA lesion is 95% stenosed.  The left ventricular ejection fraction is less than 25% by visual estimate.  There is severe left ventricular systolic dysfunction.  LV end diastolic pressure is mildly elevated.  1. Severe 3 vessel obstructive CAD. Vessels are diffusely diseased and severely calcified. 2. Severe LV dysfunction. Sparing of the base with otherwise diffuse akinesia. EF 15%.  3. Mildly elevated LVEDP  Plan: Patient is chronically ill with severe cachexia,  COPD, history of Etoh. He is not a candidate for CABG. Vessels are poorly suited for PCI and I am concerned that attempt at PCI at this point will only make his condition worse. I feel he is best managed with medical therapy and would get Palliative care consult. Prognosis is extremely poor. Discussed with Dr. Eldridge Dace.  TTE: 10/28/17  Study Conclusions  - Left ventricle: The cavity size was normal. Systolic function was severely reduced. The estimated ejection fraction was in the range of 20% to 25%. There is akinesis of the mid-apicalanterior, anterolateral, lateral, inferolateral, inferior, and apical myocardium. The study is not technically sufficient to allow evaluation of LV diastolic function. - Mitral valve: There was mild regurgitation. - Right ventricle: Systolic function was mildly reduced. - Tricuspid valve: There was moderate regurgitation. - Pulmonary arteries: PA peak pressure: 71 mm Hg (S).  Impressions:  - The right ventricular systolic pressure was increased consistent with severe pulmonary hypertension.    Patient Profile     62 y.o. male with chronic cachexia, COPD, chronic systolic CHF and etoh abuse admitted with an acute anterior STEMI. He was found to have diffuse, heavily calcified three vessel CAD. He is not a candidate for stent placement or CABG given his chronic illness. Palliative care has been involved and plans for Hospice after discharge.   Assessment & Plan    1. CAD with unstable angina: He has diffuse, heavily calcified three vessel CAD. Not a candidate for PCI or CABG. (see notes from our team last week). He will be discharged home today with Hospice care. Will continue ASA, Plavix and beta blocker.   2. Ischemic cardiomyopathy: He will likely not tolerate addition of an ARB or Ace-inh. He is being discharged with plans for Hospice.   ? If our palliative care team has arranged Hospice?  Discharge home today back to his friend  [name]'s house.   For questions or updates, please contact [facility] Please consult  [email or web address] for contact info under Cardiology/STEMI.      Signed, Verne Carrow, MD  11/03/2017, 10:09 AM  ]

## 2017-11-04 NOTE — [place] FL2 (Signed)
[place] MEDICAID FL2 LEVEL OF CARE SCREENING TOOL     IDENTIFICATION  Patient Name: Caleb Carpenter Birthdate: 1956-03-05 Sex: male Admission Date (Current Location): 10/28/2017  [facility] and [place] Number:  Producer, television/film/video and Address:  [facility], [address], [place], [place] [address]      Provider Number: [id number]  Attending Physician Name and Address:  [place], Peter M, MD  Relative Name and Phone Number:  [name], sister, [phone]    Current Level of Care: Hospital Recommended Level of Care: Skilled Nursing Facility Prior Approval Number:    Date Approved/Denied:   PASRR Number: [id number]  Discharge Plan: SNF    Current Diagnoses: Patient Active Problem List   Diagnosis Date Noted  . CAD (coronary artery disease) 11/03/2017  . DNR (do not resuscitate) 11/03/2017  . Ischemic cardiomyopathy 11/03/2017  . Protein-calorie malnutrition, severe 10/30/2017  . Palliative care encounter   . STEMI (ST elevation myocardial infarction) (HCC) 10/28/2017  . Syncope 04/01/2017  . Pressure injury of skin 04/01/2017  . Fall 02/18/2017  . Hypokalemia 11/06/2016  . Dementia 11/06/2016  . Chronic systolic heart failure (HCC) 11/01/2016  . Dehydration 08/28/2016  . General weakness 09/03/2015  . Generalized weakness 09/02/2015  . Hyponatremia 09/02/2015  . HTN (hypertension) 09/02/2015  . GERD (gastroesophageal reflux disease) 09/02/2015  . Hypomagnesemia 06/23/2015  . Leukocytosis 06/23/2015  . Alcohol abuse 06/23/2015  . Thrombocytopenia (HCC) 06/23/2015  . Reflux esophagitis   . Gastritis     Orientation RESPIRATION BLADDER Height & Weight     Self, Time, Place  O2(nasal cannula 2L) Continent Weight: 105 lb 2.6 oz (47.7 kg) Height:  5\' 5"  (165.1 cm)  BEHAVIORAL SYMPTOMS/MOOD NEUROLOGICAL BOWEL NUTRITION STATUS      Continent Diet(please see DC summary)  AMBULATORY STATUS COMMUNICATION OF NEEDS Skin   Extensive Assist  Verbally PU Stage and Appropriate Care(PU stage I sacrum)                       Personal Care Assistance Level of Assistance  Bathing, Feeding, Dressing Bathing Assistance: Limited assistance Feeding assistance: Independent Dressing Assistance: Limited assistance     Functional Limitations Info  Sight, Hearing, Speech Sight Info: Impaired Hearing Info: Impaired Speech Info: Adequate    SPECIAL CARE FACTORS FREQUENCY  PT (By licensed PT), OT (By licensed OT)     PT Frequency: 5x/week OT Frequency: 5x/week            Contractures Contractures Info: Not present    Additional Factors Info  Code Status, Allergies, Psychotropic Code Status Info: DNR Allergies Info: Codeine Psychotropic Info: trazadone         Current Medications (11/04/2017):  This is the current hospital active medication list Current Facility-Administered Medications  Medication Dose Route Frequency Provider Last Rate Last Dose  . 0.9 %  sodium chloride infusion  250 mL Intravenous PRN [place], Peter M, MD   Stopped at 10/31/17 1114  . acetaminophen (TYLENOL) tablet 650 mg  650 mg Oral Q4H PRN [place], Peter M, MD   650 mg at 11/01/17 2004  . albuterol (PROVENTIL) (2.5 MG/3ML) 0.083% nebulizer solution 2.5 mg  2.5 mg Inhalation Q4H PRN Judy Pimple M., PA-C   2.5 mg at 10/31/17 2208  . aspirin chewable tablet 81 mg  81 mg Oral Daily [place], Peter M, MD   81 mg at 11/04/17 1010  . clopidogrel (PLAVIX) tablet 75 mg  75 mg Oral Q  breakfast [place], Peter M, MD   75 mg at 11/04/17 0900  . feeding supplement (ENSURE ENLIVE) (ENSURE ENLIVE) liquid 237 mL  237 mL Oral BID BM [place], Peter M, MD   237 mL at 11/04/17 1009  . folic acid (FOLVITE) tablet 1 mg  1 mg Oral Daily Laverda Page B, NP   1 mg at 11/04/17 1009  . guaiFENesin (ROBITUSSIN) 100 MG/5ML solution 100 mg  5 mL Oral Q4H PRN [place], Peter M, MD   100 mg at 10/29/17 0003  . heparin injection 5,000 Units  5,000 Units Subcutaneous Q8H [place],  Peter M, MD   5,000 Units at 11/04/17 0501  . hydroxypropyl methylcellulose / hypromellose (ISOPTO TEARS / GONIOVISC) 2.5 % ophthalmic solution 1 drop  1 drop Both Eyes TID PRN [place], Peter M, MD   1 drop at 11/02/17 1318  . MEDLINE mouth rinse  15 mL Mouth Rinse BID [place], Peter M, MD   15 mL at 11/04/17 1010  . metoprolol tartrate (LOPRESSOR) tablet 12.5 mg  12.5 mg Oral BID Robbie Lis M, PA-C   12.5 mg at 11/04/17 1009  . multivitamin with minerals tablet 1 tablet  1 tablet Oral Daily [place], Peter M, MD   1 tablet at 11/04/17 1008  . nitroGLYCERIN (NITROSTAT) SL tablet 0.4 mg  0.4 mg Sublingual Q5 min PRN Kilroy, Luke K, PA-C      . ondansetron [facility]) injection 4 mg  4 mg Intravenous Q6H PRN [place], Peter M, MD      . sodium chloride flush (NS) 0.9 % injection 3 mL  3 mL Intravenous Q12H [place], Peter M, MD   3 mL at 11/04/17 1011  . sodium chloride flush (NS) 0.9 % injection 3 mL  3 mL Intravenous PRN [place], Peter M, MD      . sucralfate (CARAFATE) tablet 1 g  1 g Oral TID WC & HS [place], Peter M, MD   1 g at 11/04/17 0900  . thiamine (VITAMIN B-1) tablet 100 mg  100 mg Oral Daily Laverda Page B, NP   100 mg at 11/04/17 1009   Or  . thiamine (B-1) injection 100 mg  100 mg Intravenous Daily Laverda Page B, NP   100 mg at 10/29/17 0955  . tiotropium (SPIRIVA) inhalation capsule 18 mcg  18 mcg Inhalation Daily Judy Pimple M., PA-C   18 mcg at 11/04/17 0854  . traZODone (DESYREL) tablet 50 mg  50 mg Oral QHS [place], Peter M, MD   50 mg at 11/03/17 2112     Discharge Medications: Please see discharge summary for a list of discharge medications.  Relevant Imaging Results:  Relevant Lab Results:   Additional Information SSN: [id number]  Abigail Butts, LCSW

## 2017-11-04 NOTE — Progress Notes (Addendum)
12:50 pm CSW has not received return calls from patient's sister, Hilda Lias, or friend Bethann Berkshire. CSW called brother, Cindee Lame, but answer and no voicemail available. CSW to continue to attempt to reach family for disposition planning.  12:06 pm CSW has also left a voicemail for patient's roommate/friend, [name] [phone]) requesting a call back.   10:15 am CSW consulted with palliative PA, Clerance Lav, regarding disposition planning for patient. Patient seems to have improved and may be appropriate for SNF with palliative or hospice following. CM had arranged home hospice for patient, but this is no longer an option, as patient's roommate stating he is not able to help care for patient.  CSW called patient's sister, Hilda Lias, and left a message requesting call back to discuss disposition. CSW to follow and support.  Abigail Butts, LCSWA [phone]

## 2017-11-04 NOTE — Progress Notes (Signed)
Daily Progress Note   Patient Name: Caleb Carpenter       Date: 11/04/2017 DOB: 1956-01-17  Age: 62 y.o. MRN#: [id number] Attending Physician: [place], Peter M, MD Primary Care Physician: Evelene Croon, MD Admit Date: 10/28/2017  Reason for Consultation/Follow-up: Disposition and Psychosocial/spiritual support  Subjective: Notes reviewed.  The patient's room mate, Caleb Carpenter, indicated last evening that he is unable to care for him at home.  I do not have a contact number for Caleb Carpenter.   I have reached out to the patient's sister Caleb Carpenter this morning and left a message for her to return my call.  Patient is awake.  States he feels 50-50.  He is attempting to eat breakfast.  His preference is to return to [facility].  I mentioned a nursing facility to him.  He did not seem to acknowledge this idea.   Assessment: 62 yo male with advanced heart disease, severe malnutrition and alcoholism.  Patient has improved over the past week.  Now eating some, able to transfer to bedside commode.  Appears more stable.      Patient Profile/HPI:  62 y.o. male  with past medical history of COPD, HTN, and alcohol use who was admitted on 10/28/2017 with chest pain.  He was found to be positive for STEMI.  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  He is quite cachectic (BMI of 12) and is not a candidate for CABG. A palliative approach to care has been recommended and PMT was consulted for a goals of care discussion.   Length of Stay: 7  Current Medications: Scheduled Meds:  . aspirin  81 mg Oral Daily  . clopidogrel  75 mg Oral Q breakfast  . feeding supplement (ENSURE ENLIVE)  237 mL  Oral BID BM  . folic acid  1 mg Oral Daily  . heparin  5,000 Units Subcutaneous Q8H  . mouth rinse  15 mL Mouth Rinse BID  . metoprolol tartrate  12.5 mg Oral BID  . multivitamin with minerals  1 tablet Oral Daily  . sodium chloride flush  3 mL Intravenous Q12H  . sucralfate  1 g Oral TID WC & HS  .  thiamine  100 mg Oral Daily   Or  . thiamine  100 mg Intravenous Daily  . tiotropium  18 mcg Inhalation Daily  . traZODone  50 mg Oral QHS    Continuous Infusions: . sodium chloride Stopped (10/31/17 1114)    PRN Meds: sodium chloride, acetaminophen, albuterol, guaiFENesin, hydroxypropyl methylcellulose / hypromellose, nitroGLYCERIN, ondansetron (ZOFRAN) IV, sodium chloride flush  Physical Exam        Well developed pale, cachectic male, lying in bed, trying to eat breakfast.  Somewhat confused. CV rrr resp no distress on 2L Abdomen thin, nt  Vital Signs: BP 107/84 (BP Location: Right Arm)   Pulse 90   Temp 98.2 F (36.8 C) (Oral)   Resp 16   Ht 5\' 5"  (1.651 m)   Wt 47.7 kg (105 lb 2.6 oz)   SpO2 95%   BMI 17.50 kg/m  SpO2: SpO2: 95 % O2 Device: O2 Device: Room Air O2 Flow Rate: O2 Flow Rate (L/min): 2 L/min  Intake/output summary:   Intake/Output Summary (Last 24 hours) at 11/04/2017 6045 Last data filed at 11/04/2017 0459 Gross per 24 hour  Intake 600 ml  Output 830 ml  Net -230 ml   LBM: Last BM Date: 11/02/17 Baseline Weight: Weight: 39.1 kg (86 lb 3.2 oz) Most recent weight: Weight: 47.7 kg (105 lb 2.6 oz)       Palliative Assessment/Data: 40%    Flowsheet Rows     Most Recent Value  Intake Tab  Referral Department  Critical care  Unit at Time of Referral  Cardiac/Telemetry Unit  Palliative Care Primary Diagnosis  Cardiac  Date Notified  10/28/17  Palliative Care Type  New Palliative care  Reason for referral  Clarify Goals of Care  Date of Admission  10/28/17  Date first seen by Palliative Care  10/29/17  # of days Palliative referral response  time  1 Day(s)  # of days IP prior to Palliative referral  0  Clinical Assessment  Psychosocial & Spiritual Assessment  Palliative Care Outcomes      Patient Active Problem List   Diagnosis Date Noted  . CAD (coronary artery disease) 11/03/2017  . DNR (do not resuscitate) 11/03/2017  . Ischemic cardiomyopathy 11/03/2017  . Protein-calorie malnutrition, severe 10/30/2017  . Palliative care encounter   . STEMI (ST elevation myocardial infarction) (HCC) 10/28/2017  . Syncope 04/01/2017  . Pressure injury of skin 04/01/2017  . Fall 02/18/2017  . Hypokalemia 11/06/2016  . Dementia 11/06/2016  . Chronic systolic heart failure (HCC) 11/01/2016  . Dehydration 08/28/2016  . General weakness 09/03/2015  . Generalized weakness 09/02/2015  . Hyponatremia 09/02/2015  . HTN (hypertension) 09/02/2015  . GERD (gastroesophageal reflux disease) 09/02/2015  . Hypomagnesemia 06/23/2015  . Leukocytosis 06/23/2015  . Alcohol abuse 06/23/2015  . Thrombocytopenia (HCC) 06/23/2015  . Reflux esophagitis   . Gastritis     Palliative Care Plan    Recommendations/Plan:  The patient has improved over the past week.  The best situation for him would be to DC to home with hospice.  (Caleb Carpenter's house).  Alternately, DC to nursing facility +/- hospice - will defer to social work.    He still does not appear to have capacity to make his own decisions.    Caleb Carpenter [phone] is his sister and only reasonably available family member to make decisions for him.    Code Status:  DNR Hard copy on chart.  Prognosis:   < 6 months given advanced CAD, Heart failure  with EF of 15%, Pulmonary HTN, Severe malnutrition.   Discharge Planning:  To Be Determined by Caleb Carpenter with the help of Case Management and Social work  Care plan was discussed with patient.  Left voice mail for Caleb Carpenter.  Thank you for allowing the Palliative Medicine Team to assist in the care of this patient.  Total time spent:   25 min.     Greater than 50%  of this time was spent counseling and coordinating care related to the above assessment and plan.  Norvel Richards, PA-C Palliative Medicine  Please contact Palliative MedicineTeam phone at [phone] for questions and concerns between 7 am - 7 pm.   Please see AMION for individual provider pager numbers.

## 2017-11-04 NOTE — Progress Notes (Signed)
Physical Therapy Treatment Patient Details Name: Caleb Carpenter MRN: [id number] DOB: 22-Oct-1955 Today's Date: 11/04/2017    History of Present Illness Pt is a 62 y.o. male admitted 10/28/17 with substernal CP; EKG showed acute anterior STEMI. Pt found to have diffuse, heavily calcified three vessel CAD; deemed not a candidate for stent placement or CABG given his chronic illness. PMH includes COPD, ETOH abuse, HTN, asthma, thrombocytopenia, dementia.   PT Comments    Pt slowly progressing with mobility. Requires minA for bed mobility. Standing balance and pre-gait stability improved with BUE support on RW. Limited by fatigue, decreased activity tolerance, and c/o nausea upon standing this session. Pt reports agreeable for short-term SNF-level therapies to maximize functional mobility and independence prior to return home. Will follow acutely.   Follow Up Recommendations  SNF;Supervision/Assistance - 24 hour     Equipment Recommendations  None recommended by PT    Recommendations for Other Services       Precautions / Restrictions Precautions Precautions: Fall Restrictions Weight Bearing Restrictions: No    Mobility  Bed Mobility Overal bed mobility: Needs Assistance Bed Mobility: Supine to Sit     Supine to sit: Min assist     General bed mobility comments: MinA for HHA to assist trunk elevation; pt not using bed rail for support despite cues/education  Transfers Overall transfer level: Needs assistance Equipment used: 1 person hand held assist;Rolling walker (2 wheeled) Transfers: Sit to/from Stand Sit to Stand: Min assist;Min guard         General transfer comment: MinA for HHA to maintain stability upon standing. Performed repeated trial with RW and min guard; poor carryover of education for correct hand placement on RW. Pt initially reporting he need to transfer to [facility] for bowel movement, but then stating "I need to lay back down" due to  fatigue/nausea  Ambulation/Gait Ambulation/Gait assistance: Min guard   Assistive device: Rolling walker (2 wheeled)       General Gait Details: Pt able to march in place with RW and min guard for balance before needing to return to supine due to c/o fatigue and nausea   Stairs             Wheelchair Mobility    Modified Rankin (Stroke Patients Only)       Balance Overall balance assessment: Needs assistance Sitting-balance support: Feet supported Sitting balance-Leahy Scale: Fair     Standing balance support: Single extremity supported Standing balance-Leahy Scale: Poor Standing balance comment: Reliant on at least single UE support                            Cognition Arousal/Alertness: Awake/alert Behavior During Therapy: Flat affect Overall Cognitive Status: No family/caregiver present to determine baseline cognitive functioning Area of Impairment: Attention;Following commands;Safety/judgement;Awareness;Problem solving                   Current Attention Level: Sustained   Following Commands: Follows one step commands with increased time;Follows multi-step commands inconsistently Safety/Judgement: Decreased awareness of safety;Decreased awareness of deficits Awareness: Emergent Problem Solving: Slow processing;Requires verbal cues;Decreased initiation General Comments: Poor awareness and insight into deficits      Exercises      General Comments        Pertinent Vitals/Pain Pain Assessment: Faces Faces Pain Scale: Hurts a little bit Pain Location: Lower back Pain Descriptors / Indicators: Constant Pain Intervention(s): Limited activity within patient's tolerance    Home Living  Prior Function            PT Goals (current goals can now be found in the care plan section) Acute Rehab PT Goals Patient Stated Goal: None stated this session, but pt reports agreeable to go to SNF for continued  rehab PT Goal Formulation: With patient Time For Goal Achievement: 11/11/17 Potential to Achieve Goals: Fair Progress towards PT goals: Progressing toward goals    Frequency    Min 2X/week      PT Plan Current plan remains appropriate    Co-evaluation              AM-PAC PT "6 Clicks" Daily Activity  Outcome Measure  Difficulty turning over in bed (including adjusting bedclothes, sheets and blankets)?: None Difficulty moving from lying on back to sitting on the side of the bed? : Unable Difficulty sitting down on and standing up from a chair with arms (e.g., wheelchair, bedside commode, etc,.)?: Unable Help needed moving to and from a bed to chair (including a wheelchair)?: A Little Help needed walking in hospital room?: A Little Help needed climbing 3-5 steps with a railing? : A Lot 6 Click Score: 14    End of Session   Activity Tolerance: Patient limited by fatigue Patient left: in bed;with call bell/phone within reach;with bed alarm set Nurse Communication: Mobility status PT Visit Diagnosis: Unsteadiness on feet (R26.81);Other abnormalities of gait and mobility (R26.89);Muscle weakness (generalized) (M62.81)     Time: 1610-9604 PT Time Calculation (min) (ACUTE ONLY): 19 min  Charges:  $Therapeutic Activity: 8-22 mins                    G Codes:      Ina Homes, PT, DPT Acute Rehab Services  Pager: [phone]  Malachy Chamber 11/04/2017, 12:17 PM

## 2017-11-04 NOTE — Progress Notes (Signed)
Nutrition Follow-up  DOCUMENTATION CODES:   Severe malnutrition in context of chronic illness, Underweight  INTERVENTION:    Continue Ensure Enlive po BID, each supplement provides 350 kcal and 20 grams of protein  NUTRITION DIAGNOSIS:   Severe Malnutrition related to chronic illness(COPD, with possible social factors) as evidenced by severe muscle depletion, severe fat depletion.  Ongoing  GOAL:   Patient will meet greater than or equal to 90% of their needs  Unmet  MONITOR:   PO intake, Supplement acceptance   ASSESSMENT:   Pt with PMH of severe malnutrition, HTN, COPD, asthma, chewing tobacco use, ETOH abuse, CHF who was admitted with STEMI.   Plans for d/c to SNF with Hospice to follow. Patient continues to eat poorly, only consuming 10-25% of meals. He is also drinking Ensure once or twice daily. Weight trending up. I/O +680 ml since admission  Diet Order:   Diet Order           Diet Heart Room service appropriate? Yes; Fluid consistency: Thin  Diet effective now          EDUCATION NEEDS:   Not appropriate for education at this time  Skin:  Skin Assessment: Skin Integrity Issues: Skin Integrity Issues:: Stage I Stage I: sacrum   Last BM:  7/21  Height:   Ht Readings from Last 1 Encounters:  11/02/17 5\' 5"  (1.651 m)    Weight:   Wt Readings from Last 1 Encounters:  11/04/17 105 lb 2.6 oz (47.7 kg)    Ideal Body Weight:  56.8 kg  BMI:  Body mass index is 17.5 kg/m.  Estimated Nutritional Needs:   Kcal:  1500-1700  Protein:  70-85 grams  Fluid:  > 1.5 L/day    Joaquin Courts, RD, LDN, CNSC Pager [phone] After Hours Pager [phone]

## 2017-11-04 NOTE — Progress Notes (Addendum)
Progress Note  Patient Name: Caleb Carpenter Date of Encounter: 11/04/2017  Primary Cardiologist: No primary care provider on file.  Subjective   Feeling well today. Planned for DC once arrangements are finalized.   Inpatient Medications    Scheduled Meds: . aspirin  81 mg Oral Daily  . clopidogrel  75 mg Oral Q breakfast  . feeding supplement (ENSURE ENLIVE)  237 mL Oral BID BM  . folic acid  1 mg Oral Daily  . heparin  5,000 Units Subcutaneous Q8H  . mouth rinse  15 mL Mouth Rinse BID  . metoprolol tartrate  12.5 mg Oral BID  . multivitamin with minerals  1 tablet Oral Daily  . sodium chloride flush  3 mL Intravenous Q12H  . sucralfate  1 g Oral TID WC & HS  . thiamine  100 mg Oral Daily   Or  . thiamine  100 mg Intravenous Daily  . tiotropium  18 mcg Inhalation Daily  . traZODone  50 mg Oral QHS   Continuous Infusions: . sodium chloride Stopped (10/31/17 1114)   PRN Meds: sodium chloride, acetaminophen, albuterol, guaiFENesin, hydroxypropyl methylcellulose / hypromellose, nitroGLYCERIN, ondansetron (ZOFRAN) IV, sodium chloride flush   Vital Signs    Vitals:   11/03/17 1947 11/03/17 2112 11/04/17 0500 11/04/17 0854  BP: 107/83  107/84   Pulse: 98 (!) 101 90   Resp: 16  16   Temp: 99.6 F (37.6 C)  98.2 F (36.8 C)   TempSrc: Oral  Oral   SpO2: 100%  95% 95%  Weight:   105 lb 2.6 oz (47.7 kg)   Height:        Intake/Output Summary (Last 24 hours) at 11/04/2017 0946 Last data filed at 11/04/2017 0459 Gross per 24 hour  Intake 600 ml  Output 830 ml  Net -230 ml   Filed Weights   11/02/17 2203 11/03/17 0521 11/04/17 0500  Weight: 108 lb 7.5 oz (49.2 kg) 103 lb 13.4 oz (47.1 kg) 105 lb 2.6 oz (47.7 kg)    Telemetry    SR - Personally Reviewed  Physical Exam   General: Thin cachectic W male appearing in no acute distress.  Neck: Supple , no JVD. Lungs:  Resp regular and unlabored, CTA. Heart: RRR, S1, S2, no murmur; no rub. Abdomen: Soft, non-tender,  non-distended with normoactive bowel sounds.  Extremities: No clubbing, cyanosis, edema. Distal pedal pulses are 2+ bilaterally. Neuro: Alert and oriented X 3. Moves all extremities spontaneously. Psych: Normal affect.  Labs    Chemistry Recent Labs  Lab 10/28/17 1052  10/30/17 0844 10/31/17 0325 11/01/17 0224  NA 136   < > 139 141 136  K 4.8   < > 3.2* 3.9 3.9  CL 97*   < > 97* 103 95*  CO2 25   < > 31 33* 34*  GLUCOSE 146*   < > 76 98 109*  BUN 9   < > 5* 5* 11  CREATININE 1.10   < > 0.64 0.62 0.54*  CALCIUM 8.6*   < > 8.1* 8.3* 8.5*  PROT 5.8*  --  4.4*  --   --   ALBUMIN 2.9*  --  2.3*  --   --   AST 177*  --  270*  --   --   ALT 24  --  62*  --   --   ALKPHOS 49  --  39  --   --   BILITOT 1.0  --  0.9  --   --  GFRNONAA >60   < > >60 >60 >60  GFRAA >60   < > >60 >60 >60  ANIONGAP 14   < > 11 5 7    < > = values in this interval not displayed.     Hematology Recent Labs  Lab 10/29/17 0722 10/30/17 0844 10/31/17 0325  WBC 19.5* 9.8 6.9  RBC 3.60* 3.13* 3.13*  HGB 11.8* 10.2* 9.9*  HCT 37.8* 32.0* 32.2*  MCV 105.0* 102.2* 102.9*  MCH 32.8 32.6 31.6  MCHC 31.2 31.9 30.7  RDW 16.1* 15.5 15.8*  PLT 270 165 157    Cardiac Enzymes Recent Labs  Lab 10/29/17 0722  TROPONINI 0.71*    Recent Labs  Lab 10/28/17 1106  TROPIPOC 0.81*     BNP Recent Labs  Lab 10/29/17 0702  BNP 3,014.6*     DDimer No results for input(s): DDIMER in the last 168 hours.    Radiology    No results found.  Cardiac Studies   Cath: 10/28/17   Prox LAD lesion is 75% stenosed.  Prox Cx to Mid Cx lesion is 90% stenosed.  Prox RCA to Mid RCA lesion is 90% stenosed.  Ost RPDA to RPDA lesion is 95% stenosed.  The left ventricular ejection fraction is less than 25% by visual estimate.  There is severe left ventricular systolic dysfunction.  LV end diastolic pressure is mildly elevated.  1. Severe 3 vessel obstructive CAD. Vessels are diffusely diseased and  severely calcified. 2. Severe LV dysfunction. Sparing of the base with otherwise diffuse akinesia. EF 15%.  3. Mildly elevated LVEDP  Plan: Patient is chronically ill with severe cachexia, COPD, history of Etoh. He is not a candidate for CABG. Vessels are poorly suited for PCI and I am concerned that attempt at PCI at this point will only make his condition worse. I feel he is best managed with medical therapy and would get Palliative care consult. Prognosis is extremely poor. Discussed with Dr. Eldridge Dace.  TTE: 10/28/17  Study Conclusions  - Left ventricle: The cavity size was normal. Systolic function was severely reduced. The estimated ejection fraction was in the range of 20% to 25%. There is akinesis of the mid-apicalanterior, anterolateral, lateral, inferolateral, inferior, and apical myocardium. The study is not technically sufficient to allow evaluation of LV diastolic function. - Mitral valve: There was mild regurgitation. - Right ventricle: Systolic function was mildly reduced. - Tricuspid valve: There was moderate regurgitation. - Pulmonary arteries: PA peak pressure: 71 mm Hg (S).  Impressions:  - The right ventricular systolic pressure was increased consistent with severe pulmonary hypertension.  Patient Profile     62 y.o. male with chronic cachexia, COPD, chronic systolic CHF and etoh abuse admitted with an acute anterior STEMI. He was found to have diffuse, heavily calcified three vessel CAD. He is not a candidate for stent placement or CABG given his chronic illness. Palliative care has been involved and plans for Hospice after discharge once arrangements are in place.   Assessment & Plan    1. CAD with unstable angina: He has diffuse, heavily calcified three vessel CAD. Not a candidate for PCI or CABG. (see notes from our team last week). He will be discharged home once arrangements are confirmed with Hospice care. Will continue ASA, Plavix and beta  blocker.   2. Ischemic cardiomyopathy: He will likely not tolerate addition of an ARB or Ace-inh. He is planned for discharged with plans for Hospice.  Dispo: planned to go home with friend Bethann Berkshire  with hospice but he called yesterday stating that he felt he did not have the means the to care for him at home. May need to consider SNF if unable to go to friends home.   Signed, Laverda Page, NP  11/04/2017, 9:46 AM  Pager # [phone]   For questions or updates, please contact [facility] Please consult [email or web address] for contact info under Cardiology/STEMI.  I have personally seen and examined this patient. I agree with the assessment and plan as outlined above.  He is ready for d/c which was planned yesterday but friend unable to take him at his house. If he has no options for staying with family, will need to go to a SNF. Case management to help with planning for d/c to SNF.   Verne Carrow 11/04/2017 10:10 AM

## 2017-11-05 NOTE — Progress Notes (Addendum)
Progress Note  Patient Name: Caleb Carpenter Date of Encounter: 11/05/2017  Primary Cardiologist: No primary care provider on file.  Subjective   No complaints. Waiting for placement.   Inpatient Medications    Scheduled Meds: . aspirin  81 mg Oral Daily  . clopidogrel  75 mg Oral Q breakfast  . feeding supplement (ENSURE ENLIVE)  237 mL Oral BID BM  . folic acid  1 mg Oral Daily  . heparin  5,000 Units Subcutaneous Q8H  . mouth rinse  15 mL Mouth Rinse BID  . metoprolol tartrate  12.5 mg Oral BID  . multivitamin with minerals  1 tablet Oral Daily  . sucralfate  1 g Oral TID WC & HS  . thiamine  100 mg Oral Daily   Or  . thiamine  100 mg Intravenous Daily  . tiotropium  18 mcg Inhalation Daily  . traZODone  50 mg Oral QHS   Continuous Infusions: . sodium chloride Stopped (10/31/17 1114)   PRN Meds: sodium chloride, acetaminophen, albuterol, guaiFENesin, hydroxypropyl methylcellulose / hypromellose, nitroGLYCERIN, ondansetron (ZOFRAN) IV   Vital Signs    Vitals:   11/04/17 1300 11/04/17 1952 11/05/17 0453 11/05/17 0833  BP: 93/68 (!) 123/92 112/83   Pulse: 86 (!) 101 83   Resp: 16 20 16    Temp: 98.3 F (36.8 C) 98.5 F (36.9 C) 98.4 F (36.9 C)   TempSrc: Oral Oral Oral   SpO2: 95% 94% 95% 93%  Weight:   103 lb 9.9 oz (47 kg)   Height:        Intake/Output Summary (Last 24 hours) at 11/05/2017 1055 Last data filed at 11/05/2017 0458 Gross per 24 hour  Intake 477 ml  Output 825 ml  Net -348 ml   Filed Weights   11/03/17 0521 11/04/17 0500 11/05/17 0453  Weight: 103 lb 13.4 oz (47.1 kg) 105 lb 2.6 oz (47.7 kg) 103 lb 9.9 oz (47 kg)    Telemetry    SR - Personally Reviewed  Physical Exam   General: Thin, cachetic older W male appearing in no acute distress. Head: Normocephalic, atraumatic.  Neck: Supple, no JVD. Lungs:  Resp regular and unlabored, CTA. Heart: RRR, S1, S2, no murmur; no rub. Abdomen: Soft, non-tender, non-distended with normoactive  bowel sounds.  Extremities: No clubbing, cyanosis, edema. Distal pedal pulses are 2+ bilaterally. Neuro: Alert and oriented X 3. Moves all extremities spontaneously. Psych: Normal affect.  Labs    Chemistry Recent Labs  Lab 10/30/17 0844 10/31/17 0325 11/01/17 0224  NA 139 141 136  K 3.2* 3.9 3.9  CL 97* 103 95*  CO2 31 33* 34*  GLUCOSE 76 98 109*  BUN 5* 5* 11  CREATININE 0.64 0.62 0.54*  CALCIUM 8.1* 8.3* 8.5*  PROT 4.4*  --   --   ALBUMIN 2.3*  --   --   AST 270*  --   --   ALT 62*  --   --   ALKPHOS 39  --   --   BILITOT 0.9  --   --   GFRNONAA >60 >60 >60  GFRAA >60 >60 >60  ANIONGAP 11 5 7      Hematology Recent Labs  Lab 10/30/17 0844 10/31/17 0325  WBC 9.8 6.9  RBC 3.13* 3.13*  HGB 10.2* 9.9*  HCT 32.0* 32.2*  MCV 102.2* 102.9*  MCH 32.6 31.6  MCHC 31.9 30.7  RDW 15.5 15.8*  PLT 165 157    Cardiac EnzymesNo results for input(s):  TROPONINI in the last 168 hours. No results for input(s): TROPIPOC in the last 168 hours.   BNPNo results for input(s): BNP, PROBNP in the last 168 hours.   DDimer No results for input(s): DDIMER in the last 168 hours.    Radiology    No results found.  Cardiac Studies   Cath: 10/28/17   Prox LAD lesion is 75% stenosed.  Prox Cx to Mid Cx lesion is 90% stenosed.  Prox RCA to Mid RCA lesion is 90% stenosed.  Ost RPDA to RPDA lesion is 95% stenosed.  The left ventricular ejection fraction is less than 25% by visual estimate.  There is severe left ventricular systolic dysfunction.  LV end diastolic pressure is mildly elevated.  1. Severe 3 vessel obstructive CAD. Vessels are diffusely diseased and severely calcified. 2. Severe LV dysfunction. Sparing of the base with otherwise diffuse akinesia. EF 15%.  3. Mildly elevated LVEDP  Plan: Patient is chronically ill with severe cachexia, COPD, history of Etoh. He is not a candidate for CABG. Vessels are poorly suited for PCI and I am concerned that attempt at  PCI at this point will only make his condition worse. I feel he is best managed with medical therapy and would get Palliative care consult. Prognosis is extremely poor. Discussed with Dr. Eldridge Dace.  TTE: 10/28/17  Study Conclusions  - Left ventricle: The cavity size was normal. Systolic function was severely reduced. The estimated ejection fraction was in the range of 20% to 25%. There is akinesis of the mid-apicalanterior, anterolateral, lateral, inferolateral, inferior, and apical myocardium. The study is not technically sufficient to allow evaluation of LV diastolic function. - Mitral valve: There was mild regurgitation. - Right ventricle: Systolic function was mildly reduced. - Tricuspid valve: There was moderate regurgitation. - Pulmonary arteries: PA peak pressure: 71 mm Hg (S).  Impressions:  - The right ventricular systolic pressure was increased consistent with severe pulmonary hypertension.  Patient Profile     62 y.o. male with chronic cachexia, COPD, chronic systolic CHF and etoh abuse admitted with an acute anterior STEMI. He was found to have diffuse, heavily calcified three vessel CAD. He is not a candidate for stent placement or CABG given his chronic illness. Palliative care has been involved and plans for Hospice after discharge once arrangements, but now improved and planned for SNF placement.   Assessment & Plan    1. CAD with unstable angina: He has diffuse, heavily calcified three vessel CAD. Not a candidate for PCI or CABG. (see notes from our team last week). Initial plan was for hospice but he has improved since his admission. No longer qualifies. Will continue ASA, Plavix and beta blocker.   2. Ischemic cardiomyopathy: He will likely not tolerate addition of an ARB or Ace-inh. No signs of volume overload on exam.   Dispo: planned to go home with friend Bethann Berkshire with hospice but he called stating that he felt he did not have the means the to  care for him at home. Seen by PT and recommendations for SNF noted. SW working to find placement. Plan for discharge once placement found.   Signed, Laverda Page, NP  11/05/2017, 10:55 AM  Pager # [phone]   For questions or updates, please contact [facility] Please consult [email or web address] for contact info under Cardiology/STEMI.  I have personally seen and examined this patient. I agree with the assessment and plan as outlined above.  No changes today. Waiting on SNF bed. Cardiac issues stable.   Cristal Deer  McAlhany 11/05/2017 11:08 AM

## 2017-11-05 NOTE — Progress Notes (Signed)
Clinical Social Worker following patient for support and discharge needs. Patient at this time would qualify for SNF with palliative/hospice to follow per [facility] Palliative Team. Patient is unabel to make his own decision, CSW contacted patients sister and left voicemail to discuss discharge plan. CSW is awaiting a return phone call back from family member to move forward with discharge.  Marrianne Mood, MSW,  [employer] [phone]

## 2017-11-06 LAB — CBC
HEMATOCRIT: 31.7 % — AB (ref 39.0–52.0)
Hemoglobin: 10.2 g/dL — ABNORMAL LOW (ref 13.0–17.0)
MCH: 32.8 pg (ref 26.0–34.0)
MCHC: 32.2 g/dL (ref 30.0–36.0)
MCV: 101.9 fL — AB (ref 78.0–100.0)
PLATELETS: 272 10*3/uL (ref 150–400)
RBC: 3.11 MIL/uL — ABNORMAL LOW (ref 4.22–5.81)
RDW: 16.9 % — AB (ref 11.5–15.5)
WBC: 7 10*3/uL (ref 4.0–10.5)

## 2017-11-06 LAB — BASIC METABOLIC PANEL
ANION GAP: 8 (ref 5–15)
BUN: 7 mg/dL — ABNORMAL LOW (ref 8–23)
CHLORIDE: 99 mmol/L (ref 98–111)
CO2: 27 mmol/L (ref 22–32)
Calcium: 8.7 mg/dL — ABNORMAL LOW (ref 8.9–10.3)
Creatinine, Ser: 0.6 mg/dL — ABNORMAL LOW (ref 0.61–1.24)
Glucose, Bld: 110 mg/dL — ABNORMAL HIGH (ref 70–99)
POTASSIUM: 3.8 mmol/L (ref 3.5–5.1)
SODIUM: 134 mmol/L — AB (ref 135–145)

## 2017-11-06 MED ORDER — MENTHOL 3 MG MT LOZG
1.0000 | LOZENGE | OROMUCOSAL | Status: DC | PRN
Start: 2017-11-06 — End: 2017-11-10
  Administered 2017-11-06: 3 mg via ORAL
  Filled 2017-11-06: qty 9

## 2017-11-06 NOTE — Plan of Care (Signed)
Pt VSS with no C/O pain bur is very weak and needs assistance to go to the [facility]. He's on and off O2 but found his O2 on his chin and his sats were in the mid 90's, will leave the O2 off for awhile to see how he does without it, no C/O pain but has a dry, hacky cough, medicated him with Robitussin, will continue to monitor.

## 2017-11-06 NOTE — Progress Notes (Addendum)
Progress Note  Patient Name: Caleb Carpenter Date of Encounter: 11/06/2017  Primary Cardiologist: No primary care provider on file.  Subjective   No complaints this morning. Sitting up in bed, had breakfast.   Inpatient Medications    Scheduled Meds: . aspirin  81 mg Oral Daily  . clopidogrel  75 mg Oral Q breakfast  . feeding supplement (ENSURE ENLIVE)  237 mL Oral BID BM  . folic acid  1 mg Oral Daily  . heparin  5,000 Units Subcutaneous Q8H  . mouth rinse  15 mL Mouth Rinse BID  . metoprolol tartrate  12.5 mg Oral BID  . multivitamin with minerals  1 tablet Oral Daily  . sucralfate  1 g Oral TID WC & HS  . thiamine  100 mg Oral Daily   Or  . thiamine  100 mg Intravenous Daily  . tiotropium  18 mcg Inhalation Daily  . traZODone  50 mg Oral QHS   Continuous Infusions: . sodium chloride Stopped (10/31/17 1114)   PRN Meds: sodium chloride, acetaminophen, albuterol, guaiFENesin, hydroxypropyl methylcellulose / hypromellose, nitroGLYCERIN, ondansetron (ZOFRAN) IV   Vital Signs    Vitals:   11/05/17 2232 11/06/17 0452 11/06/17 0837 11/06/17 0846  BP: (!) 150/100 100/82  131/88  Pulse: (!) 102 84  86  Resp:  (!) 21    Temp:  98 F (36.7 C)    TempSrc:  Oral    SpO2:  96% 95%   Weight:  105 lb 9.6 oz (47.9 kg)    Height:        Intake/Output Summary (Last 24 hours) at 11/06/2017 0931 Last data filed at 11/06/2017 0514 Gross per 24 hour  Intake 960 ml  Output 1250 ml  Net -290 ml   Filed Weights   11/04/17 0500 11/05/17 0453 11/06/17 0452  Weight: 105 lb 2.6 oz (47.7 kg) 103 lb 9.9 oz (47 kg) 105 lb 9.6 oz (47.9 kg)    Telemetry    SR - Personally Reviewed  Physical Exam   General: Thin cachetic male appearing in no acute distress. Head: Normocephalic, atraumatic.  Neck: Supple, no JVD. Lungs:  Resp regular and unlabored, CTA. Heart: RRR, S1, S2, no murmur; no rub. Abdomen: Soft, non-tender, non-distended with normoactive bowel sounds. Extremities: No  clubbing, cyanosis, edema. Distal pedal pulses are 2+ bilaterally. Neuro: Alert and oriented X 3. Moves all extremities spontaneously. Psych: Normal affect.  Labs    Chemistry Recent Labs  Lab 10/31/17 0325 11/01/17 0224 11/06/17 0428  NA 141 136 134*  K 3.9 3.9 3.8  CL 103 95* 99  CO2 33* 34* 27  GLUCOSE 98 109* 110*  BUN 5* 11 7*  CREATININE 0.62 0.54* 0.60*  CALCIUM 8.3* 8.5* 8.7*  GFRNONAA >60 >60 >60  GFRAA >60 >60 >60  ANIONGAP 5 7 8      Hematology Recent Labs  Lab 10/31/17 0325 11/06/17 0428  WBC 6.9 7.0  RBC 3.13* 3.11*  HGB 9.9* 10.2*  HCT 32.2* 31.7*  MCV 102.9* 101.9*  MCH 31.6 32.8  MCHC 30.7 32.2  RDW 15.8* 16.9*  PLT 157 272    Cardiac EnzymesNo results for input(s): TROPONINI in the last 168 hours. No results for input(s): TROPIPOC in the last 168 hours.   BNPNo results for input(s): BNP, PROBNP in the last 168 hours.   DDimer No results for input(s): DDIMER in the last 168 hours.    Radiology    No results found.  Cardiac Studies  Cath: 10/28/17   Prox LAD lesion is 75% stenosed.  Prox Cx to Mid Cx lesion is 90% stenosed.  Prox RCA to Mid RCA lesion is 90% stenosed.  Ost RPDA to RPDA lesion is 95% stenosed.  The left ventricular ejection fraction is less than 25% by visual estimate.  There is severe left ventricular systolic dysfunction.  LV end diastolic pressure is mildly elevated.  1. Severe 3 vessel obstructive CAD. Vessels are diffusely diseased and severely calcified. 2. Severe LV dysfunction. Sparing of the base with otherwise diffuse akinesia. EF 15%.  3. Mildly elevated LVEDP  Plan: Patient is chronically ill with severe cachexia, COPD, history of Etoh. He is not a candidate for CABG. Vessels are poorly suited for PCI and I am concerned that attempt at PCI at this point will only make his condition worse. I feel he is best managed with medical therapy and would get Palliative care consult. Prognosis is extremely  poor. Discussed with Dr. Eldridge Dace.  TTE: 10/28/17  Study Conclusions  - Left ventricle: The cavity size was normal. Systolic function was severely reduced. The estimated ejection fraction was in the range of 20% to 25%. There is akinesis of the mid-apicalanterior, anterolateral, lateral, inferolateral, inferior, and apical myocardium. The study is not technically sufficient to allow evaluation of LV diastolic function. - Mitral valve: There was mild regurgitation. - Right ventricle: Systolic function was mildly reduced. - Tricuspid valve: There was moderate regurgitation. - Pulmonary arteries: PA peak pressure: 71 mm Hg (S).  Impressions:  - The right ventricular systolic pressure was increased consistent with severe pulmonary hypertension.  Patient Profile     62 y.o. male with chronic cachexia, COPD, chronic systolic CHF and etoh abuse admitted with an acute anterior STEMI. He was found to have diffuse, heavily calcified three vessel CAD. He is not a candidate for stent placement or CABG given his chronic illness. Palliative care has been involved and plans for Hospice after dischargeonce arrangements, but now improved and planned for SNF placement.   Assessment & Plan    1. CAD with unstable angina:He has diffuse, heavily calcified three vessel CAD. Not a candidate for PCI or CABG. (see notes from our team last week). Initial plan was for hospice but he has improved since his admission. No longer qualifies.  -- Will continue ASA, Plavix and beta blocker.   2. Ischemic cardiomyopathy:Blood pressures remain soft, therefore, he will likely not tolerate addition of an ARB or Ace-inh. No signs of volume overload on exam.   Dispo:planned to go home with friend Bethann Berkshire with hospice but he called stating that he felt he did not have the means the to care for him at home. Seen by PT and recommendations for SNF noted. SW working to find placement. Plan for discharge  once placement found. No new recommendations.   Signed, Laverda Page, NP  11/06/2017, 9:31 AM  Pager # [phone]   For questions or updates, please contact [facility] Please consult [email or web address] for contact info under Cardiology/STEMI.  I have personally seen and examined this patient. I agree with the assessment and plan as outlined above.  He is ready for d/c but awaiting SNF bed. Nothing else to change in regards to his cardiac care.  Continue current medications. D/C to SNF when bed available.   Verne Carrow 11/06/2017 9:59 AM

## 2017-11-06 NOTE — Plan of Care (Signed)
Pt is to remain free of falls. Bed alarm in in place and all personal items within reach.

## 2017-11-07 DIAGNOSIS — F1099 Alcohol use, unspecified with unspecified alcohol-induced disorder: Secondary | ICD-10-CM

## 2017-11-07 DIAGNOSIS — Z008 Encounter for other general examination: Secondary | ICD-10-CM

## 2017-11-07 DIAGNOSIS — Z66 Do not resuscitate: Secondary | ICD-10-CM

## 2017-11-07 DIAGNOSIS — Z87891 Personal history of nicotine dependence: Secondary | ICD-10-CM

## 2017-11-07 NOTE — Consult Note (Addendum)
[facility] Face-to-Face Psychiatry Consult   Reason for Consult:  Capacity evaluation  Referring Physician:  Dr. Jordan  Patient Identification: Caleb Carpenter MRN:  [id number] Principal Diagnosis: Evaluation by psychiatric service required Diagnosis:   Patient Active Problem List   Diagnosis Date Noted  . CAD (coronary artery disease) [I25.10] 11/03/2017  . DNR (do not resuscitate) [Z66] 11/03/2017  . Ischemic cardiomyopathy [I25.5] 11/03/2017  . Protein-calorie malnutrition, severe [E43] 10/30/2017  . Palliative care encounter [Z51.5]   . STEMI (ST elevation myocardial infarction) (HCC) [I21.3] 10/28/2017  . Syncope [R55] 04/01/2017  . Pressure injury of skin [L89.90] 04/01/2017  . Fall [W19.XXXA] 02/18/2017  . Hypokalemia [E87.6] 11/06/2016  . Dementia [F03.90] 11/06/2016  . Chronic systolic heart failure (HCC) [I50.22] 11/01/2016  . Dehydration [E86.0] 08/28/2016  . General weakness [R53.1] 09/03/2015  . Generalized weakness [R53.1] 09/02/2015  . Hyponatremia [E87.1] 09/02/2015  . HTN (hypertension) [I10] 09/02/2015  . GERD (gastroesophageal reflux disease) [K21.9] 09/02/2015  . Hypomagnesemia [E83.42] 06/23/2015  . Leukocytosis [D72.829] 06/23/2015  . Alcohol abuse [F10.10] 06/23/2015  . Thrombocytopenia (HCC) [D69.6] 06/23/2015  . Reflux esophagitis [K21.0]   . Gastritis [K29.70]     Total Time spent with patient: 1 hour  Subjective:   Caleb Carpenter is a 62 y.o. male patient admitted with acute anterior STEMI.   HPI:  Per chart review, patient was admitted with acute anterior STEMI. He is not a candidate for stent placement or CABG given his chronic illness. Hospice was planned after discharge but he is improved now and is recommended for SNF placement. He previously planned to go home with his friend but he reported that he could not properly care for patient. Psychiatry was consulted for capacity evaluation for SNF placement given concern for ability to consent due to  fluctuations in mental status. He is receiving Trazodone 50 mg qhs in the hospital. Wellbutrin 150 mg daily is listed as a home medication although he is not receiving this medication in the hospital.   On interview, Mr. Ledvina reports that he came to the hospital for a MI.  He reports that he has a history of bronchitis.  He is able to state why his treatment team is recommending SNF placement.  He reports that he has generalized weakness and needs assistance with his ADLs at this time.  He reports that he was previously independent of his ADLs.  He denies SI, HI or AVH.  He denies problems with sleep or appetite.  He denies a history of depression or anxiety.  Per medical records, it appears that he has taken Wellbutrin in the past.  He is oriented to person, place, month and day.  He believes the year is 2016.  Past Psychiatric History: Denies  Risk to Self:  None. Denies SI. Risk to Others:  None. Denies HI.  Prior Inpatient Therapy:  Denies  Prior Outpatient Therapy:  Denies   Past Medical History:  Past Medical History:  Diagnosis Date  . Asthma   . COPD (chronic obstructive pulmonary disease) (HCC)   . Hypertension     Past Surgical History:  Procedure Laterality Date  . CATARACT EXTRACTION, BILATERAL Bilateral   . ESOPHAGOGASTRODUODENOSCOPY (EGD) WITH PROPOFOL N/A 06/21/2015   Procedure: ESOPHAGOGASTRODUODENOSCOPY (EGD) WITH PROPOFOL;  Surgeon: Darren Wohl, MD;  Location: [facility] ENDOSCOPY;  Service: Endoscopy;  Laterality: N/A;  . LEFT HEART CATH AND CORONARY ANGIOGRAPHY N/A 10/28/2017   Procedure: LEFT HEART CATH AND CORONARY ANGIOGRAPHY;  Surgeon: Jordan, Peter M, MD;  Location:   [facility];  Service: Cardiovascular;  Laterality: N/A;   Family History:  Family History  Problem Relation Age of Onset  . Diabetes Unknown   . Hypertension Unknown    Family Psychiatric  History: Denies  Social History:  Social History   Substance and Sexual Activity  Alcohol Use Yes  .  Alcohol/week: 1.2 oz  . Types: 2 Cans of beer per week   Comment: per day     Social History   Substance and Sexual Activity  Drug Use No    Social History   Socioeconomic History  . Marital status: Legally Separated    Spouse name: Not on file  . Number of children: Not on file  . Years of education: 6  . Highest education level: 6th grade  Occupational History  . Occupation: retired  Scientific laboratory technician  . Financial resource strain: Very hard  . Food insecurity:    Worry: Often true    Inability: Often true  . Transportation needs:    Medical: No    Non-medical: No  Tobacco Use  . Smoking status: Former Smoker    Types: Cigarettes    Last attempt to quit: 06/21/2014    Years since quitting: 3.3  . Smokeless tobacco: Current User    Types: Chew  Substance and Sexual Activity  . Alcohol use: Yes    Alcohol/week: 1.2 oz    Types: 2 Cans of beer per week    Comment: per day  . Drug use: No  . Sexual activity: Never  Lifestyle  . Physical activity:    Days per week: Not on file    Minutes per session: Not on file  . Stress: Not on file  Relationships  . Social connections:    Talks on phone: Not on file    Gets together: Not on file    Attends religious service: Not on file    Active member of club or organization: Not on file    Attends meetings of clubs or organizations: Not on file    Relationship status: Not on file  Other Topics Concern  . Not on file  Social History Narrative  . Not on file   Additional Social History: He previously lived with a close friend. He receives disability. He reports social alcohol use. He denies illicit substance use.     Allergies:   Allergies  Allergen Reactions  . Codeine Other (See Comments)    ams    Labs:  Results for orders placed or performed during the hospital encounter of 10/28/17 (from the past 48 hour(s))  CBC     Status: Abnormal   Collection Time: 11/06/17  4:28 AM  Result Value Ref Range   WBC 7.0 4.0 -  10.5 K/uL   RBC 3.11 (L) 4.22 - 5.81 MIL/uL   Hemoglobin 10.2 (L) 13.0 - 17.0 g/dL   HCT 31.7 (L) 39.0 - 52.0 %   MCV 101.9 (H) 78.0 - 100.0 fL   MCH 32.8 26.0 - 34.0 pg   MCHC 32.2 30.0 - 36.0 g/dL   RDW 16.9 (H) 11.5 - 15.5 %   Platelets 272 150 - 400 K/uL    Comment: Performed at [facility], [place] [address], [place], [place] [address]  Basic metabolic panel     Status: Abnormal   Collection Time: 11/06/17  4:28 AM  Result Value Ref Range   Sodium 134 (L) 135 - 145 mmol/L   Potassium 3.8 3.5 - 5.1  mmol/L   Chloride 99 98 - 111 mmol/L   CO2 27 22 - 32 mmol/L   Glucose, Bld 110 (H) 70 - 99 mg/dL   BUN 7 (L) 8 - 23 mg/dL   Creatinine, Ser 0.60 (L) 0.61 - 1.24 mg/dL   Calcium 8.7 (L) 8.9 - 10.3 mg/dL   GFR calc non Af Amer >60 >60 mL/min   GFR calc Af Amer >60 >60 mL/min    Comment: (NOTE) The eGFR has been calculated using the CKD EPI equation. This calculation has not been validated in all clinical situations. eGFR's persistently <60 mL/min signify possible Chronic Kidney Disease.    Anion gap 8 5 - 15    Comment: Performed at No Name [address], [place], [place] [address]    Current Facility-Administered Medications  Medication Dose Route Frequency Provider Last Rate Last Dose  . 0.9 %  sodium chloride infusion  250 mL Intravenous PRN Evans Lance, MD   Stopped at 10/31/17 1114  . acetaminophen (TYLENOL) tablet 650 mg  650 mg Oral Q4H PRN Evans Lance, MD   650 mg at 11/01/17 2004  . albuterol (PROVENTIL) (2.5 MG/3ML) 0.083% nebulizer solution 2.5 mg  2.5 mg Inhalation Q4H PRN Evans Lance, MD   2.5 mg at 10/31/17 2208  . aspirin chewable tablet 81 mg  81 mg Oral Daily Evans Lance, MD   81 mg at 11/07/17 0950  . clopidogrel (PLAVIX) tablet 75 mg  75 mg Oral Q breakfast Evans Lance, MD   75 mg at 11/07/17 0953  . feeding supplement (ENSURE ENLIVE) (ENSURE ENLIVE) liquid 237 mL  237 mL Oral BID BM Evans Lance, MD   237 mL at 11/07/17  1000  . folic acid (FOLVITE) tablet 1 mg  1 mg Oral Daily Evans Lance, MD   1 mg at 11/07/17 0950  . guaiFENesin (ROBITUSSIN) 100 MG/5ML solution 100 mg  5 mL Oral Q4H PRN Evans Lance, MD   100 mg at 11/06/17 1623  . heparin injection 5,000 Units  5,000 Units Subcutaneous Q8H Evans Lance, MD   5,000 Units at 11/07/17 [phone]  . hydroxypropyl methylcellulose / hypromellose (ISOPTO TEARS / GONIOVISC) 2.5 % ophthalmic solution 1 drop  1 drop Both Eyes TID PRN Martinique, Peter M, MD   1 drop at 11/02/17 1318  . MEDLINE mouth rinse  15 mL Mouth Rinse BID Evans Lance, MD   15 mL at 11/06/17 2105  . menthol-cetylpyridinium (CEPACOL) lozenge 3 mg  1 lozenge Oral PRN Marcie Mowers, MD   3 mg at 11/06/17 2236  . metoprolol tartrate (LOPRESSOR) tablet 12.5 mg  12.5 mg Oral BID Evans Lance, MD   12.5 mg at 11/07/17 0950  . multivitamin with minerals tablet 1 tablet  1 tablet Oral Daily Evans Lance, MD   1 tablet at 11/07/17 0950  . nitroGLYCERIN (NITROSTAT) SL tablet 0.4 mg  0.4 mg Sublingual Q5 min PRN Kilroy, Luke K, PA-C      . ondansetron [facility]) injection 4 mg  4 mg Intravenous Q6H PRN Evans Lance, MD      . sucralfate (CARAFATE) tablet 1 g  1 g Oral TID WC & HS Evans Lance, MD   1 g at 11/07/17 1206  . thiamine (VITAMIN B-1) tablet 100 mg  100 mg Oral Daily Evans Lance, MD   100 mg at 11/07/17 6389   Or  . thiamine (  B-1) injection 100 mg  100 mg Intravenous Daily Taylor, Gregg W, MD   100 mg at 10/29/17 0955  . tiotropium (SPIRIVA) inhalation capsule 18 mcg  18 mcg Inhalation Daily Taylor, Gregg W, MD   18 mcg at 11/07/17 0832  . traZODone (DESYREL) tablet 50 mg  50 mg Oral QHS Taylor, Gregg W, MD   50 mg at 11/06/17 2105    Musculoskeletal: Strength & Muscle Tone: decreased due to physical deconditioning.  Gait & Station: unable to stand Patient leans: N/A  Psychiatric Specialty Exam: Physical Exam  Nursing note and vitals reviewed. Constitutional: He  appears well-developed.  thin  HENT:  Head: Normocephalic and atraumatic.  Neck: Normal range of motion.  Respiratory: Effort normal.  Musculoskeletal: Normal range of motion.  Neurological: He is alert.  Oriented to person, place, month and day.  Skin: No rash noted.  Multiple healing excoriations on chest.  Psychiatric: He has a normal mood and affect. His speech is normal and behavior is normal. Judgment and thought content normal. Cognition and memory are impaired.    Review of Systems  Constitutional: Negative for chills and fever.  Cardiovascular: Negative for chest pain.  Gastrointestinal: Positive for constipation. Negative for abdominal pain, diarrhea, nausea and vomiting.  Psychiatric/Behavioral: Negative for depression, hallucinations, substance abuse and suicidal ideas. The patient does not have insomnia.   All other systems reviewed and are negative.   Blood pressure 121/82, pulse 80, temperature 98 F (36.7 C), temperature source Oral, resp. rate 17, height 5' 5" (1.651 m), weight 48.7 kg (107 lb 5.8 oz), SpO2 94 %.Body mass index is 17.87 kg/m.  General Appearance: Fairly Groomed, thin, middle aged, Caucasian male, wearing a hospital gown with long hair and beard who is lying in bed. NAD.   Eye Contact:  Good  Speech:  Clear and Coherent and Normal Rate  Volume:  Normal  Mood:  Euthymic  Affect:  Appropriate and Congruent  Thought Process:  Goal Directed, Linear and Descriptions of Associations: Intact  Orientation:  Full (Time, Place, and Person)  Thought Content:  Logical  Suicidal Thoughts:  No  Homicidal Thoughts:  No  Memory:  Immediate;   Fair Recent;   Fair Remote;   Fair  Judgement:  Fair  Insight:  Fair  Psychomotor Activity:  Normal  Concentration:  Concentration: Good and Attention Span: Good  Recall:  Good  Fund of Knowledge:  Good  Language:  Good  Akathisia:  No  Handed:  Right  AIMS (if indicated):   N/A  Assets:  Communication  Skills Desire for Improvement Financial Resources/Insurance Housing Social Support  ADL's:  Intact  Cognition:  WNL  Sleep:   Okay   Assessment:  Elester A Carreiro is a 61 y.o. male who was admitted with acute anterior STEMI. He was oriented to person, place, month and day at the time of interview. He did not appear confused. He is able to state his medical condition. He has capacity to consent to SNF placement. He understands why SNF placement is needed given physical decompensation.   Treatment Plan Summary: -Patient has capacity to consent to SNF placement at this time.  -Psychiatry will sign off on patient at this time. Please consult psychiatry again as needed.  Disposition: No evidence of imminent risk to self or others at present.   Patient does not meet criteria for psychiatric inpatient admission.  Jacqueline J Norman, DO 11/07/2017 2:11 PM 

## 2017-11-07 NOTE — Progress Notes (Addendum)
Progress Note  Patient Name: Caleb Carpenter Date of Encounter: 11/07/2017  Primary Cardiologist: No primary care provider on file.  Subjective   Feeling well today. No complaints.  Inpatient Medications    Scheduled Meds: . aspirin  81 mg Oral Daily  . clopidogrel  75 mg Oral Q breakfast  . feeding supplement (ENSURE ENLIVE)  237 mL Oral BID BM  . folic acid  1 mg Oral Daily  . heparin  5,000 Units Subcutaneous Q8H  . mouth rinse  15 mL Mouth Rinse BID  . metoprolol tartrate  12.5 mg Oral BID  . multivitamin with minerals  1 tablet Oral Daily  . sucralfate  1 g Oral TID WC & HS  . thiamine  100 mg Oral Daily   Or  . thiamine  100 mg Intravenous Daily  . tiotropium  18 mcg Inhalation Daily  . traZODone  50 mg Oral QHS   Continuous Infusions: . sodium chloride Stopped (10/31/17 1114)   PRN Meds: sodium chloride, acetaminophen, albuterol, guaiFENesin, hydroxypropyl methylcellulose / hypromellose, menthol-cetylpyridinium, nitroGLYCERIN, ondansetron (ZOFRAN) IV   Vital Signs    Vitals:   11/07/17 0457 11/07/17 0600 11/07/17 0832 11/07/17 0950  BP: 125/76   121/82  Pulse: 83 81  80  Resp: 15 17    Temp: 98 F (36.7 C)     TempSrc: Oral     SpO2: 92% 94% 94%   Weight: 107 lb 5.8 oz (48.7 kg)     Height:        Intake/Output Summary (Last 24 hours) at 11/07/2017 1030 Last data filed at 11/07/2017 0908 Gross per 24 hour  Intake 355 ml  Output 1850 ml  Net -1495 ml   Filed Weights   11/05/17 0453 11/06/17 0452 11/07/17 0457  Weight: 103 lb 9.9 oz (47 kg) 105 lb 9.6 oz (47.9 kg) 107 lb 5.8 oz (48.7 kg)    Telemetry    SR - Personally Reviewed  Physical Exam   General: Thin cachetic male appearing in no acute distress. Head: Normocephalic, atraumatic.  Neck: Supple without bruits, JVD. Lungs:  Resp regular and unlabored, CTA. Heart: RRR, S1, S2, no murmur; no rub. Abdomen: Soft, non-tender, non-distended with normoactive bowel sounds.  Extremities: No  clubbing, cyanosis, edema. Distal pedal pulses are 2+ bilaterally. Neuro: Alert and oriented X 3. Moves all extremities spontaneously. Psych: Normal affect.  Labs    Chemistry Recent Labs  Lab 11/01/17 0224 11/06/17 0428  NA 136 134*  K 3.9 3.8  CL 95* 99  CO2 34* 27  GLUCOSE 109* 110*  BUN 11 7*  CREATININE 0.54* 0.60*  CALCIUM 8.5* 8.7*  GFRNONAA >60 >60  GFRAA >60 >60  ANIONGAP 7 8     Hematology Recent Labs  Lab 11/06/17 0428  WBC 7.0  RBC 3.11*  HGB 10.2*  HCT 31.7*  MCV 101.9*  MCH 32.8  MCHC 32.2  RDW 16.9*  PLT 272    Cardiac EnzymesNo results for input(s): TROPONINI in the last 168 hours. No results for input(s): TROPIPOC in the last 168 hours.   BNPNo results for input(s): BNP, PROBNP in the last 168 hours.   DDimer No results for input(s): DDIMER in the last 168 hours.    Radiology    No results found.  Cardiac Studies   Cath: 10/28/17   Prox LAD lesion is 75% stenosed.  Prox Cx to Mid Cx lesion is 90% stenosed.  Prox RCA to Mid RCA lesion is 90% stenosed.  Ost RPDA to RPDA lesion is 95% stenosed.  The left ventricular ejection fraction is less than 25% by visual estimate.  There is severe left ventricular systolic dysfunction.  LV end diastolic pressure is mildly elevated.  1. Severe 3 vessel obstructive CAD. Vessels are diffusely diseased and severely calcified. 2. Severe LV dysfunction. Sparing of the base with otherwise diffuse akinesia. EF 15%.  3. Mildly elevated LVEDP  Plan: Patient is chronically ill with severe cachexia, COPD, history of Etoh. He is not a candidate for CABG. Vessels are poorly suited for PCI and I am concerned that attempt at PCI at this point will only make his condition worse. I feel he is best managed with medical therapy and would get Palliative care consult. Prognosis is extremely poor. Discussed with Dr. Eldridge Dace.  TTE: 10/28/17  Study Conclusions  - Left ventricle: The cavity size was  normal. Systolic function was severely reduced. The estimated ejection fraction was in the range of 20% to 25%. There is akinesis of the mid-apicalanterior, anterolateral, lateral, inferolateral, inferior, and apical myocardium. The study is not technically sufficient to allow evaluation of LV diastolic function. - Mitral valve: There was mild regurgitation. - Right ventricle: Systolic function was mildly reduced. - Tricuspid valve: There was moderate regurgitation. - Pulmonary arteries: PA peak pressure: 71 mm Hg (S).  Impressions:  - The right ventricular systolic pressure was increased consistent with severe pulmonary hypertension.  Patient Profile     62 y.o. male with chronic cachexia, COPD, chronic systolic CHF and etoh abuse admitted with an acute anterior STEMI. He was found to have diffuse, heavily calcified three vessel CAD. He is not a candidate for stent placement or CABG given his chronic illness. Palliative care has been involved and plans for Hospice after dischargeonce arrangements, but now improved and planned for SNF placement.   Assessment & Plan    1. CAD with unstable angina:He has diffuse, heavily calcified three vessel CAD. Not a candidate for PCI or CABG. (see notes from our team last week).Initial plan was for hospice but he has improved since his admission.No longer qualifies. -- Will continue ASA, Plavix and beta blocker.   2. Ischemic cardiomyopathy:Blood pressures remain soft, therefore, he will likely not tolerate addition of an ARB or Ace-inh.No signs of volume overload on exam.Continue with current treatment plan.   Dispo:planned to go home with friend Bethann Berkshire with hospice but he called stating that he felt he did not have the means the to care for him at home.Patient is now reporting that Bethann Berkshire is not always reliable. Seen by PT and recommendations for SNF noted. SW working to find placement. Plan for discharge once placement  found.No new recommendations.   Signed, Laverda Page, NP  11/07/2017, 10:30 AM  Pager # [phone]   For questions or updates, please contact [facility] Please consult [email or web address] for contact info under Cardiology/STEMI.  I have personally seen and examined this patient. I agree with the assessment and plan as outlined above.  He is stable from a cardiac standpoint. Awaiting SNF bed. No changes today.   Verne Carrow 11/07/2017 10:46 AM

## 2017-11-07 NOTE — Progress Notes (Signed)
CSW following for discharge plan. CSW met with patient today to discuss discharge plan, and patient seemed to not remember what was discussed with him yesterday in regards to discharge plan. When CSW asked if he had been able to talk to [facility], patient said he didn't know. Patient could not remember that he had said he wanted to talk to [facility] yesterday about taking him home. Patient did ask about facility placement, and where facilities were located in [place]. CSW was not sure if patient seemed to understand what was being discussed due to his responses during conversation.  CSW updated MD, and discussed ordering a psych consult for patient's capacity to make decisions. Patient continues to have no bed offers, and none of his family is responding to assist with making decisions for the patient at this time.  CSW to continue to follow.  Laveda Abbe, [place] Clinical Social Worker [phone]

## 2017-11-07 NOTE — Progress Notes (Signed)
CSW met with patient to discuss discharge plan (patient is documented as alert and fully oriented). CSW discussed how the patient's friend Charlotte Crumb is saying that he can't take care of him anymore, so we need to find another place for the patient to go where he can be cared for 24 hours a day. Patient said that [name] hasn't told him that he can't go home with him, that Charlotte Crumb is still saying that he can come back home. Patient attempted to call [name] on his cell phone, but he didn't answer. Patient would like to discuss with Charlotte Crumb so he can hear it himself.  CSW discussed with patient other options available if [name] can't take care of him anymore, and patient acknowledged that he has nowhere else to go. Patient discussed how his siblings are "useless"; he said his youngest sister is "downright hateful". Patient stated he has no one else. CSW explained that patient is requiring care around the clock because he can't do much for himself anymore, and patient agreed. CSW explained that we need to look at placing the patient into a facility under his Medicaid so that he could receive care. Patient asked about where and how long, and said he needed time to think about it and that he still wanted to talk to [facility] first. Anthony said she'd come check back with the patient tomorrow.  CSW checked in with Admissions at [facility] and [facility], as the patient has no bed offers at this time. Facility may be willing to offer a bed as long as the patient is fully aware that it will be a palliative/hospice admission, with no therapy offered for rehabilitation. CSW to continue to follow.  Laveda Abbe, [place] Clinical Social Worker [phone]

## 2017-11-07 NOTE — Progress Notes (Signed)
Physical Therapy Treatment Patient Details Name: Caleb Carpenter MRN: [id number] DOB: 1955-05-13 Today's Date: 11/07/2017    History of Present Illness Pt is a 62 y.o. male admitted 10/28/17 with substernal CP; EKG showed acute anterior STEMI. Pt found to have diffuse, heavily calcified three vessel CAD; deemed not a candidate for stent placement or CABG given his chronic illness. PMH includes COPD, ETOH abuse, HTN, asthma, thrombocytopenia, dementia.    PT Comments    Pt demonstrating mild improvements in cognition and activity tolerance this visit. Session focused on practicing functional transfers to [facility] and short distance ambulation in hospital room. Patient is Min A level for mobility, intermittent min guard. Heavily encouraged patient and nursing staff to avoid bed pan usage and instead utilize BSC. Pt states understanding.    Follow Up Recommendations  SNF;Supervision/Assistance - 24 hour     Equipment Recommendations  None recommended by PT    Recommendations for Other Services       Precautions / Restrictions Restrictions Weight Bearing Restrictions: No RLE Weight Bearing: Weight bearing as tolerated    Mobility  Bed Mobility Overal bed mobility: Needs Assistance Bed Mobility: Supine to Sit     Supine to sit: Supervision        Transfers Overall transfer level: Needs assistance Equipment used: Rolling walker (2 wheeled) Transfers: Sit to/from Stand Sit to Stand: Min assist;Min guard Stand pivot transfers: Min guard       General transfer comment: transfer training to and from commode x2, pt with min guard and increased tolerence to activity this session  Ambulation/Gait Ambulation/Gait assistance: Min guard Gait Distance (Feet): 15 Feet Assistive device: Rolling walker (2 wheeled) Gait Pattern/deviations: Step-to pattern Gait velocity: decreased   General Gait Details: several steps in hospital room and back to bed, after transfer training, increassed  activity tolerence. no overt LOB but safer with RW    Stairs             Wheelchair Mobility    Modified Rankin (Stroke Patients Only)       Balance Overall balance assessment: Needs assistance Sitting-balance support: Feet supported Sitting balance-Leahy Scale: Fair     Standing balance support: Single extremity supported Standing balance-Leahy Scale: Poor Standing balance comment: Reliant on at least single UE support                            Cognition Arousal/Alertness: Awake/alert Behavior During Therapy: Flat affect Overall Cognitive Status: No family/caregiver present to determine baseline cognitive functioning                     Current Attention Level: Selective   Following Commands: Follows multi-step commands with increased time   Awareness: Emergent Problem Solving: Slow processing;Requires verbal cues;Decreased initiation        Exercises General Exercises - Lower Extremity Long Arc Quad: 10 reps    General Comments        Pertinent Vitals/Pain      Home Living                      Prior Function            PT Goals (current goals can now be found in the care plan section) Acute Rehab PT Goals Patient Stated Goal: None stated this session, but pt reports agreeable to go to SNF for continued rehab PT Goal Formulation: With patient Time For Goal Achievement: 11/11/17  Potential to Achieve Goals: Fair Progress towards PT goals: Progressing toward goals    Frequency    Min 2X/week      PT Plan Current plan remains appropriate    Co-evaluation              AM-PAC PT "6 Clicks" Daily Activity  Outcome Measure  Difficulty turning over in bed (including adjusting bedclothes, sheets and blankets)?: None Difficulty moving from lying on back to sitting on the side of the bed? : A Little Difficulty sitting down on and standing up from a chair with arms (e.g., wheelchair, bedside commode, etc,.)?:  A Little Help needed moving to and from a bed to chair (including a wheelchair)?: A Little Help needed walking in hospital room?: A Little Help needed climbing 3-5 steps with a railing? : A Lot 6 Click Score: 18    End of Session Equipment Utilized During Treatment: Gait belt Activity Tolerance: Patient limited by fatigue Patient left: in bed;with call bell/phone within reach;with bed alarm set Nurse Communication: Mobility status PT Visit Diagnosis: Unsteadiness on feet (R26.81);Other abnormalities of gait and mobility (R26.89);Muscle weakness (generalized) (M62.81)     Time: 9735-3299 PT Time Calculation (min) (ACUTE ONLY): 15 min  Charges:  $Therapeutic Activity: 8-22 mins                    Etta Grandchild, PT, DPT Acute Rehab Services Pager: [phone]     Etta Grandchild 11/07/2017, 9:44 AM

## 2017-11-08 DIAGNOSIS — Z008 Encounter for other general examination: Secondary | ICD-10-CM

## 2017-11-08 NOTE — Plan of Care (Signed)
Plan d/c to SNF pending Medicaid verification.

## 2017-11-08 NOTE — Progress Notes (Addendum)
7/27:  CSW followed up with Marcelino Duster at [facility] [employer] [facility] [employer]. Still waiting for verification of Medicaid.    CSW received notification that pt has been deemed to make his own decisions per psychiatry. CSW reached out [facility] they reported that they must verify his Medicaid in order to accept him which may not be possible over the weekend.  They will contact CSW back with more information.  Budd Palmer LCSWA [phone]

## 2017-11-08 NOTE — Progress Notes (Signed)
Progress Note  Patient Name: Caleb Carpenter Date of Encounter: 11/08/2017  Primary Cardiologist: No primary care provider on file.  Subjective   Feels well today.  No acute complaints.  Inpatient Medications    Scheduled Meds: . aspirin  81 mg Oral Daily  . clopidogrel  75 mg Oral Q breakfast  . feeding supplement (ENSURE ENLIVE)  237 mL Oral BID BM  . folic acid  1 mg Oral Daily  . heparin  5,000 Units Subcutaneous Q8H  . mouth rinse  15 mL Mouth Rinse BID  . metoprolol tartrate  12.5 mg Oral BID  . multivitamin with minerals  1 tablet Oral Daily  . sucralfate  1 g Oral TID WC & HS  . thiamine  100 mg Oral Daily   Or  . thiamine  100 mg Intravenous Daily  . tiotropium  18 mcg Inhalation Daily  . traZODone  50 mg Oral QHS   Continuous Infusions: . sodium chloride Stopped (10/31/17 1114)   PRN Meds: sodium chloride, acetaminophen, albuterol, guaiFENesin, hydroxypropyl methylcellulose / hypromellose, menthol-cetylpyridinium, nitroGLYCERIN, ondansetron (ZOFRAN) IV   Vital Signs    Vitals:   11/07/17 2112 11/08/17 0500 11/08/17 0539 11/08/17 0540  BP: 125/76   129/83  Pulse: 91   79  Resp:  (!) 23  (!) 22  Temp:    98.6 F (37 C)  TempSrc:    Oral  SpO2:    95%  Weight:   104 lb 15 oz (47.6 kg)   Height:        Intake/Output Summary (Last 24 hours) at 11/08/2017 1018 Last data filed at 11/08/2017 0700 Gross per 24 hour  Intake 240 ml  Output 1125 ml  Net -885 ml   Filed Weights   11/06/17 0452 11/07/17 0457 11/08/17 0539  Weight: 105 lb 9.6 oz (47.9 kg) 107 lb 5.8 oz (48.7 kg) 104 lb 15 oz (47.6 kg)    Telemetry    Sinus rhythm- Personally Reviewed  Physical Exam   GEN: Well nourished, well developed, in no acute distress  HEENT: normal  Neck: no JVD, carotid bruits, or masses Cardiac: RRR; no murmurs, rubs, or gallops,no edema  Respiratory:  clear to auscultation bilaterally, normal work of breathing GI: soft, nontender, nondistended, + BS MS: no  deformity or atrophy  Skin: warm and dry Neuro:  Strength and sensation are intact Psych: euthymic mood, full affect   Labs    Chemistry Recent Labs  Lab 11/06/17 0428  NA 134*  K 3.8  CL 99  CO2 27  GLUCOSE 110*  BUN 7*  CREATININE 0.60*  CALCIUM 8.7*  GFRNONAA >60  GFRAA >60  ANIONGAP 8     Hematology Recent Labs  Lab 11/06/17 0428  WBC 7.0  RBC 3.11*  HGB 10.2*  HCT 31.7*  MCV 101.9*  MCH 32.8  MCHC 32.2  RDW 16.9*  PLT 272    Cardiac EnzymesNo results for input(s): TROPONINI in the last 168 hours. No results for input(s): TROPIPOC in the last 168 hours.   BNPNo results for input(s): BNP, PROBNP in the last 168 hours.   DDimer No results for input(s): DDIMER in the last 168 hours.    Radiology    No results found.  Cardiac Studies   Cath: 10/28/17   Prox LAD lesion is 75% stenosed.  Prox Cx to Mid Cx lesion is 90% stenosed.  Prox RCA to Mid RCA lesion is 90% stenosed.  Ost RPDA to RPDA lesion is  95% stenosed.  The left ventricular ejection fraction is less than 25% by visual estimate.  There is severe left ventricular systolic dysfunction.  LV end diastolic pressure is mildly elevated.  1. Severe 3 vessel obstructive CAD. Vessels are diffusely diseased and severely calcified. 2. Severe LV dysfunction. Sparing of the base with otherwise diffuse akinesia. EF 15%.  3. Mildly elevated LVEDP  Plan: Patient is chronically ill with severe cachexia, COPD, history of Etoh. He is not a candidate for CABG. Vessels are poorly suited for PCI and I am concerned that attempt at PCI at this point Krissie Merrick only make his condition worse. I feel he is best managed with medical therapy and would get Palliative care consult. Prognosis is extremely poor. Discussed with Dr. Eldridge Dace.  TTE: 10/28/17  Study Conclusions  - Left ventricle: The cavity size was normal. Systolic function was severely reduced. The estimated ejection fraction was in the range  of 20% to 25%. There is akinesis of the mid-apicalanterior, anterolateral, lateral, inferolateral, inferior, and apical myocardium. The study is not technically sufficient to allow evaluation of LV diastolic function. - Mitral valve: There was mild regurgitation. - Right ventricle: Systolic function was mildly reduced. - Tricuspid valve: There was moderate regurgitation. - Pulmonary arteries: PA peak pressure: 71 mm Hg (S).  Impressions:  - The right ventricular systolic pressure was increased consistent with severe pulmonary hypertension.  Patient Profile     62 y.o. male with chronic cachexia, COPD, chronic systolic CHF and etoh abuse admitted with an acute anterior STEMI. He was found to have diffuse, heavily calcified three vessel CAD. He is not a candidate for stent placement or CABG given his chronic illness. Palliative care has been involved and plans for Hospice after dischargeonce arrangements, but now improved and planned for SNF placement.   Assessment & Plan    1. CAD with unstable angina:Currently on aspirin, Plavix, beta-blocker.  Has heavily diffuse disease.  Not currently candidate for PCI.  Planning for medical management for his coronary disease.  2. Ischemic cardiomyopathy:Blood pressures have been low in the past, but has since come up.  He is currently on a beta-blocker but no ACE inhibitor versus ARB.  We Dorathea Faerber continue to hold and follow blood pressures.  Dispo:Hospice had been called, but the patient does not qualify.  Plan for discharge to SNF once a bed is available.  Signed, Breckyn Troyer Jorja Loa, MD  11/08/2017, 10:18 AM   For questions or updates, please contact [facility] Please consult [email or web address] for contact info under Cardiology/STEMI.

## 2017-11-09 MED ORDER — LOSARTAN POTASSIUM 25 MG PO TABS
25.0000 mg | ORAL_TABLET | Freq: Every day | ORAL | Status: DC
  Administered 2017-11-09 – 2017-11-10 (×2): 25 mg via ORAL
  Filled 2017-11-09 (×2): qty 1

## 2017-11-09 MED ORDER — METOPROLOL TARTRATE 25 MG PO TABS
25.0000 mg | ORAL_TABLET | Freq: Two times a day (BID) | ORAL | Status: DC
  Administered 2017-11-09 – 2017-11-10 (×2): 25 mg via ORAL
  Filled 2017-11-09 (×2): qty 1

## 2017-11-09 NOTE — Progress Notes (Signed)
Pt stated was having cp with pain 3/10. Ekg- no change from previous. After ekg done- patient started coughing and said his pain was all from the cough and not heart related. He would like to have meds for that not cp. Gave prn dose of Robitussin. Pt stated relief from that. Pt expressed that he would like to have doctor discuss symptoms with him tomorrow.

## 2017-11-09 NOTE — Progress Notes (Signed)
Progress Note  Patient Name: Caleb Carpenter Date of Encounter: 11/09/2017  Primary Cardiologist: No primary care provider on file.  Subjective   Feeling well today.  No complaints.  Inpatient Medications    Scheduled Meds: . aspirin  81 mg Oral Daily  . clopidogrel  75 mg Oral Q breakfast  . feeding supplement (ENSURE ENLIVE)  237 mL Oral BID BM  . folic acid  1 mg Oral Daily  . heparin  5,000 Units Subcutaneous Q8H  . mouth rinse  15 mL Mouth Rinse BID  . metoprolol tartrate  12.5 mg Oral BID  . multivitamin with minerals  1 tablet Oral Daily  . sucralfate  1 g Oral TID WC & HS  . thiamine  100 mg Oral Daily   Or  . thiamine  100 mg Intravenous Daily  . tiotropium  18 mcg Inhalation Daily  . traZODone  50 mg Oral QHS   Continuous Infusions: . sodium chloride Stopped (10/31/17 1114)   PRN Meds: sodium chloride, acetaminophen, albuterol, guaiFENesin, hydroxypropyl methylcellulose / hypromellose, menthol-cetylpyridinium, nitroGLYCERIN, ondansetron (ZOFRAN) IV   Vital Signs    Vitals:   11/08/17 2150 11/08/17 2337 11/09/17 0552 11/09/17 0844  BP: 113/78 116/86 114/77 140/72  Pulse:  92 79 (!) 101  Resp:   15   Temp: 98.4 F (36.9 C)  98.7 F (37.1 C)   TempSrc: Oral  Oral   SpO2:      Weight:      Height:        Intake/Output Summary (Last 24 hours) at 11/09/2017 0952 Last data filed at 11/09/2017 0858 Gross per 24 hour  Intake 720 ml  Output 600 ml  Net 120 ml   Filed Weights   11/06/17 0452 11/07/17 0457 11/08/17 0539  Weight: 105 lb 9.6 oz (47.9 kg) 107 lb 5.8 oz (48.7 kg) 104 lb 15 oz (47.6 kg)    Telemetry    Sinus rhythm- Personally Reviewed  Physical Exam   GEN: Well nourished, well developed, in no acute distress  HEENT: normal  Neck: no JVD, carotid bruits, or masses Cardiac: RRR; no murmurs, rubs, or gallops,no edema  Respiratory:  clear to auscultation bilaterally, normal work of breathing GI: soft, nontender, nondistended, + BS MS: no  deformity or atrophy  Skin: warm and dry Neuro:  Strength and sensation are intact Psych: euthymic mood, full affect    Labs    Chemistry Recent Labs  Lab 11/06/17 0428  NA 134*  K 3.8  CL 99  CO2 27  GLUCOSE 110*  BUN 7*  CREATININE 0.60*  CALCIUM 8.7*  GFRNONAA >60  GFRAA >60  ANIONGAP 8     Hematology Recent Labs  Lab 11/06/17 0428  WBC 7.0  RBC 3.11*  HGB 10.2*  HCT 31.7*  MCV 101.9*  MCH 32.8  MCHC 32.2  RDW 16.9*  PLT 272    Cardiac EnzymesNo results for input(s): TROPONINI in the last 168 hours. No results for input(s): TROPIPOC in the last 168 hours.   BNPNo results for input(s): BNP, PROBNP in the last 168 hours.   DDimer No results for input(s): DDIMER in the last 168 hours.    Radiology    No results found.  Cardiac Studies   Cath: 10/28/17   Prox LAD lesion is 75% stenosed.  Prox Cx to Mid Cx lesion is 90% stenosed.  Prox RCA to Mid RCA lesion is 90% stenosed.  Ost RPDA to RPDA lesion is 95% stenosed.  The left ventricular ejection fraction is less than 25% by visual estimate.  There is severe left ventricular systolic dysfunction.  LV end diastolic pressure is mildly elevated.  1. Severe 3 vessel obstructive CAD. Vessels are diffusely diseased and severely calcified. 2. Severe LV dysfunction. Sparing of the base with otherwise diffuse akinesia. EF 15%.  3. Mildly elevated LVEDP  Plan: Patient is chronically ill with severe cachexia, COPD, history of Etoh. He is not a candidate for CABG. Vessels are poorly suited for PCI and I am concerned that attempt at PCI at this point will only make his condition worse. I feel he is best managed with medical therapy and would get Palliative care consult. Prognosis is extremely poor. Discussed with Dr. Eldridge Dace.  TTE: 10/28/17  Study Conclusions  - Left ventricle: The cavity size was normal. Systolic function was severely reduced. The estimated ejection fraction was in  the range of 20% to 25%. There is akinesis of the mid-apicalanterior, anterolateral, lateral, inferolateral, inferior, and apical myocardium. The study is not technically sufficient to allow evaluation of LV diastolic function. - Mitral valve: There was mild regurgitation. - Right ventricle: Systolic function was mildly reduced. - Tricuspid valve: There was moderate regurgitation. - Pulmonary arteries: PA peak pressure: 71 mm Hg (S).  Impressions:  - The right ventricular systolic pressure was increased consistent with severe pulmonary hypertension.  Patient Profile     62 y.o. male with chronic cachexia, COPD, chronic systolic CHF and etoh abuse admitted with an acute anterior STEMI. He was found to have diffuse, heavily calcified three vessel CAD. He is not a candidate for stent placement or CABG given his chronic illness. Palliative care has been involved and plans for Hospice after dischargeonce arrangements, but now improved and planned for SNF placement.   Assessment & Plan    1. CAD with unstable angina:Severe three-vessel disease.  Currently on aspirin, Plavix, beta-blocker.  Not a candidate for PCI.  Planning for medical management.    2. Ischemic cardiomyopathy:Had previously been low but have recovered.  We will titrate heart failure medications.  Dispo:Plan for discharge to SNF once bed available.  Does not qualify for hospice.  Signed, Will Jorja Loa, MD  11/09/2017, 9:52 AM   For questions or updates, please contact [facility] Please consult [email or web address] for contact info under Cardiology/STEMI.

## 2017-11-10 DIAGNOSIS — I2584 Coronary atherosclerosis due to calcified coronary lesion: Secondary | ICD-10-CM

## 2017-11-10 MED ORDER — METOPROLOL SUCCINATE ER 50 MG PO TB24: 50.0000 mg | ORAL_TABLET | Freq: Every day | ORAL | 11 refills | Status: DC

## 2017-11-10 MED ORDER — NITROGLYCERIN 0.4 MG SL SUBL: 0.4000 mg | SUBLINGUAL_TABLET | SUBLINGUAL | 2 refills | Status: DC | PRN

## 2017-11-10 MED ORDER — ATORVASTATIN CALCIUM 40 MG PO TABS: 40.0000 mg | ORAL_TABLET | Freq: Every day | ORAL | 11 refills | Status: DC

## 2017-11-10 MED ORDER — LOSARTAN POTASSIUM 25 MG PO TABS: 25.0000 mg | ORAL_TABLET | Freq: Every day | ORAL | Status: DC

## 2017-11-10 MED ORDER — CLOPIDOGREL BISULFATE 75 MG PO TABS: 75.0000 mg | ORAL_TABLET | Freq: Every day | ORAL | 11 refills | Status: DC

## 2017-11-10 MED ORDER — METOPROLOL TARTRATE 25 MG PO TABS: 25.0000 mg | ORAL_TABLET | Freq: Two times a day (BID) | ORAL | Status: DC

## 2017-11-10 MED ORDER — FOLIC ACID 1 MG PO TABS: 1.0000 mg | ORAL_TABLET | Freq: Every day | ORAL | 11 refills | Status: DC

## 2017-11-10 NOTE — Progress Notes (Signed)
Physical Therapy Treatment Patient Details Name: Caleb Carpenter MRN: [id number] DOB: 04-03-1956 Today's Date: 11/10/2017    History of Present Illness Pt is a 62 y.o. male admitted 10/28/17 with substernal CP; EKG showed acute anterior STEMI. Pt found to have diffuse, heavily calcified three vessel CAD; deemed not a candidate for stent placement or CABG given his chronic illness. PMH includes COPD, ETOH abuse, HTN, asthma, thrombocytopenia, dementia.   PT Comments    Pt slowly progressing with mobility. Able to transfer and ambulate with RW and close min guard for balance. Remains limited by generalized weakness, decreased activity tolerance and c/o bilat knee soreness with ambulation. DOE 2/4; SpO2 95% on RA. Pt remains at increased risk for falls. Encouraged continued ambulation with assist from nursing staff. Continue to recommend SNF-level therapies to maximize functional mobility and independence.   Follow Up Recommendations  SNF;Supervision/Assistance - 24 hour     Equipment Recommendations  None recommended by PT    Recommendations for Other Services       Precautions / Restrictions Precautions Precautions: Fall Restrictions Weight Bearing Restrictions: No    Mobility  Bed Mobility Overal bed mobility: Needs Assistance Bed Mobility: Supine to Sit     Supine to sit: Supervision        Transfers Overall transfer level: Needs assistance Equipment used: Rolling walker (2 wheeled) Transfers: Sit to/from Stand Sit to Stand: Min guard            Ambulation/Gait Ambulation/Gait assistance: Min guard Gait Distance (Feet): (hallway) Assistive device: Rolling walker (2 wheeled) Gait Pattern/deviations: Step-through pattern;Decreased stride length;Trunk flexed Gait velocity: Decreased Gait velocity interpretation: <1.8 ft/sec, indicate of risk for recurrent falls General Gait Details: Slow, antalgic amb with RW and min guard for balance; further amb distance limited  by fatigue and c/o bilat knee soreness   Stairs             Wheelchair Mobility    Modified Rankin (Stroke Patients Only)       Balance Overall balance assessment: Needs assistance Sitting-balance support: Feet supported Sitting balance-Leahy Scale: Fair Sitting balance - Comments: Increased time to don shoes sitting EOB   Standing balance support: Single extremity supported Standing balance-Leahy Scale: Poor Standing balance comment: Reliant on at least single UE support                            Cognition Arousal/Alertness: Awake/alert Behavior During Therapy: Flat affect Overall Cognitive Status: No family/caregiver present to determine baseline cognitive functioning Area of Impairment: Attention;Following commands;Safety/judgement;Awareness;Problem solving                   Current Attention Level: Selective   Following Commands: Follows multi-step commands with increased time Safety/Judgement: Decreased awareness of safety;Decreased awareness of deficits Awareness: Emergent Problem Solving: Slow processing;Requires verbal cues;Decreased initiation General Comments: Poor awareness and insight into deficits      Exercises      General Comments General comments (skin integrity, edema, etc.): SpO2 95% on RA      Pertinent Vitals/Pain Pain Assessment: No/denies pain    Home Living                      Prior Function            PT Goals (current goals can now be found in the care plan section) Acute Rehab PT Goals Patient Stated Goal: Motivated to get out of the hospital  and keep getting stronger PT Goal Formulation: With patient Time For Goal Achievement: 11/17/17 Potential to Achieve Goals: Fair Progress towards PT goals: Progressing toward goals    Frequency    Min 2X/week      PT Plan Current plan remains appropriate    Co-evaluation              AM-PAC PT "6 Clicks" Daily Activity  Outcome  Measure  Difficulty turning over in bed (including adjusting bedclothes, sheets and blankets)?: A Little Difficulty moving from lying on back to sitting on the side of the bed? : A Little Difficulty sitting down on and standing up from a chair with arms (e.g., wheelchair, bedside commode, etc,.)?: A Little Help needed moving to and from a bed to chair (including a wheelchair)?: A Little Help needed walking in hospital room?: A Little Help needed climbing 3-5 steps with a railing? : A Lot 6 Click Score: 17    End of Session Equipment Utilized During Treatment: Gait belt Activity Tolerance: Patient limited by fatigue Patient left: in bed;with call bell/phone within reach;with bed alarm set Nurse Communication: Mobility status PT Visit Diagnosis: Unsteadiness on feet (R26.81);Other abnormalities of gait and mobility (R26.89);Muscle weakness (generalized) (M62.81)     Time: 4503-8882 PT Time Calculation (min) (ACUTE ONLY): 16 min  Charges:  $Gait Training: 8-22 mins                    Ina Homes, PT, DPT Acute Rehab Services  Pager: [phone]  Malachy Chamber 11/10/2017, 2:08 PM

## 2017-11-10 NOTE — Progress Notes (Signed)
Patient has a bed at [facility] today. SNF requires updated discharge summary indicating plan for patient to discharge to SNF with hospice following. Paged NP and MD. CSW to follow and support.   Abigail Butts, LCSWA [phone]

## 2017-11-10 NOTE — Clinical Social Work Note (Signed)
Clinical Social Work Assessment  Patient Details  Name: Caleb Carpenter MRN: [id number] Date of Birth: [birth year]  Date of referral:  11/10/17               Reason for consult:  Facility Placement, Discharge Planning                Permission sought to share information with:  Facility Sport and exercise psychologist, Family Supports Permission granted to share information::  Yes, Verbal Permission Granted  Name::     Sport and exercise psychologist::  SNFs  Relationship::  friend  Contact Information:  [phone]  Housing/Transportation Living arrangements for the past 2 months:  Single Family Home Source of Information:  Patient Patient Interpreter Needed:  None Criminal Activity/Legal Involvement Pertinent to Current Situation/Hospitalization:  No - Comment as needed Significant Relationships:  Siblings, Friend Lives with:  Friends Do you feel safe going back to the place where you live?  Yes Need for family participation in patient care:  Yes (Comment)  Care giving concerns: Patient from home with friend. Initially planned for home with hospice, but friend was unable to provide care for patient. Family not involved, not returning calls. Now plan for SNF with hospice. Patient cleared for decision making by psychiatry.    Social Worker assessment / plan: CSW met with patient at bedside and introduced self and role. CSW reviewed patient's conversation with covering CSW last week; patient did not recall the conversation about SNF with hospice from last week. CSW did review options for disposition. CSW explained that due to patient's prognosis, SNF with hospice would likely be a long term placement. Patient hesitant about a long term placement, but agreeable to go to SNF. Patient was hopeful to reach his friend Charlotte Crumb, who he lived with prior to admission. CSW reflected that CSWs had been unable to reach [place], and that initial plan for discharge was cancelled due to [facility] not being able to help with patient's  care at home.   Patient stated if he had someone to come to the house and help he could go home. CSW explained that hospice care at home would not be all day and patient would have to pay out of pocket for 24 hour care.   [facility] offered [facility] bed for patient. Patient agreeable to the SNF. Paged NP for updated discharge summary. CSW to support with discharge.  Employment status:  Disabled (Comment on whether or not currently receiving Disability) Insurance information:  Medicaid [place] PT Recommendations:  [place] / Referral to community resources:  [place]  Patient/Family's Response to care: Not discussed.  Patient/Family's Understanding of and Emotional Response to Diagnosis, Current Treatment, and Prognosis: Patient oriented, though forgetful. Patient agreeable to SNF with hospice.  Emotional Assessment Appearance:  Appears stated age Attitude/Demeanor/Rapport:  Engaged Affect (typically observed):  Accepting, Calm, Appropriate Orientation:  Oriented to Self, Oriented to Place, Oriented to  Time Alcohol / Substance use:  Not Applicable Psych involvement (Current and /or in the community):  No (Comment)  Discharge Needs  Concerns to be addressed:  Discharge Planning Concerns, Care Coordination Readmission within the last 30 days:  No Current discharge risk:  Physical Impairment, Terminally ill, Dependent with Mobility Barriers to Discharge:  No Barriers Identified   Estanislado Emms, LCSW 11/10/2017, 12:02 PM

## 2017-11-10 NOTE — Progress Notes (Signed)
Report called to [facility], pt aware of discharge. Pt dc'd via PTAR no distressed noted.

## 2017-11-10 NOTE — Progress Notes (Signed)
Patient will discharge to [facility]. Anticipated discharge date: 11/10/17 Family notified: unable to reach family; patient deemed to have capacity for decisions, responsible for self Transportation by: PTAR  Nurse to call report to [phone]. Patient will go to room 223B at the facility.   CSW signing off.  Abigail Butts, LCSWA  Clinical Social Worker

## 2017-11-10 NOTE — Progress Notes (Signed)
Progress Note  Patient Name: Caleb Carpenter Date of Encounter: 11/10/2017  Primary Cardiologist: No primary care provider on file.   Subjective   Feeling well, no complaints. Planning to walk with PT after our encounter. Hopeful for SNF today.  Inpatient Medications    Scheduled Meds: . aspirin  81 mg Oral Daily  . clopidogrel  75 mg Oral Q breakfast  . feeding supplement (ENSURE ENLIVE)  237 mL Oral BID BM  . folic acid  1 mg Oral Daily  . heparin  5,000 Units Subcutaneous Q8H  . losartan  25 mg Oral Daily  . mouth rinse  15 mL Mouth Rinse BID  . metoprolol tartrate  25 mg Oral BID  . multivitamin with minerals  1 tablet Oral Daily  . sucralfate  1 g Oral TID WC & HS  . thiamine  100 mg Oral Daily  . tiotropium  18 mcg Inhalation Daily  . traZODone  50 mg Oral QHS   Continuous Infusions: . sodium chloride Stopped (10/31/17 1114)   PRN Meds: sodium chloride, acetaminophen, albuterol, guaiFENesin, hydroxypropyl methylcellulose / hypromellose, menthol-cetylpyridinium, nitroGLYCERIN, ondansetron (ZOFRAN) IV   Vital Signs    Vitals:   11/10/17 0513 11/10/17 0720 11/10/17 0934 11/10/17 1332  BP: 111/62  100/65   Pulse: 76  83   Resp: 18     Temp: 98.6 F (37 C)     TempSrc: Oral     SpO2: 94% 93%  94%  Weight: 106 lb 7.7 oz (48.3 kg)     Height:        Intake/Output Summary (Last 24 hours) at 11/10/2017 1337 Last data filed at 11/10/2017 0850 Gross per 24 hour  Intake 480 ml  Output 725 ml  Net -245 ml   Filed Weights   11/07/17 0457 11/08/17 0539 11/10/17 0513  Weight: 107 lb 5.8 oz (48.7 kg) 104 lb 15 oz (47.6 kg) 106 lb 7.7 oz (48.3 kg)    Telemetry    NSR - Personally Reviewed  ECG    NSR - Personally Reviewed  Physical Exam   GEN: No acute distress.   Neck: supple, no JVD Cardiac: regular S1 and S2, no murmurs, rubs, or gallops.  Respiratory: Clear to auscultation bilaterally. GI: Soft, nontender, non-distended. Bowel sounds normal MS: No  edema; No deformity. Neuro:  Nonfocal, moves all limbs independently Psych: Normal affect   Labs    Chemistry Recent Labs  Lab 11/06/17 0428  NA 134*  K 3.8  CL 99  CO2 27  GLUCOSE 110*  BUN 7*  CREATININE 0.60*  CALCIUM 8.7*  GFRNONAA >60  GFRAA >60  ANIONGAP 8     Hematology Recent Labs  Lab 11/06/17 0428  WBC 7.0  RBC 3.11*  HGB 10.2*  HCT 31.7*  MCV 101.9*  MCH 32.8  MCHC 32.2  RDW 16.9*  PLT 272    Cardiac EnzymesNo results for input(s): TROPONINI in the last 168 hours. No results for input(s): TROPIPOC in the last 168 hours.   BNPNo results for input(s): BNP, PROBNP in the last 168 hours.   DDimer No results for input(s): DDIMER in the last 168 hours.   Radiology    No results found.  Cardiac Studies   Cath: 10/28/17   Prox LAD lesion is 75% stenosed.  Prox Cx to Mid Cx lesion is 90% stenosed.  Prox RCA to Mid RCA lesion is 90% stenosed.  Ost RPDA to RPDA lesion is 95% stenosed.  The left ventricular  ejection fraction is less than 25% by visual estimate.  There is severe left ventricular systolic dysfunction.  LV end diastolic pressure is mildly elevated.  1. Severe 3 vessel obstructive CAD. Vessels are diffusely diseased and severely calcified. 2. Severe LV dysfunction. Sparing of the base with otherwise diffuse akinesia. EF 15%.  3. Mildly elevated LVEDP  Plan: Patient is chronically ill with severe cachexia, COPD, history of Etoh. He is not a candidate for CABG. Vessels are poorly suited for PCI and I am concerned that attempt at PCI at this point will only make his condition worse. I feel he is best managed with medical therapy and would get Palliative care consult. Prognosis is extremely poor. Discussed with Dr. Eldridge Dace.  TTE: 10/28/17  Study Conclusions  - Left ventricle: The cavity size was normal. Systolic function was severely reduced. The estimated ejection fraction was in the range of 20% to 25%. There is  akinesis of the mid-apicalanterior, anterolateral, lateral, inferolateral, inferior, and apical myocardium. The study is not technically sufficient to allow evaluation of LV diastolic function. - Mitral valve: There was mild regurgitation. - Right ventricle: Systolic function was mildly reduced. - Tricuspid valve: There was moderate regurgitation. - Pulmonary arteries: PA peak pressure: 71 mm Hg (S).  Impressions:  - The right ventricular systolic pressure was increased consistent with severe pulmonary hypertension.    Patient Profile     62 y.o. 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 Unfortunately he had diffuse disease in all 3 vessels, and his chronic conditions and frailty made him a poor candidate for intervention. Given his poor functional status, plan is for discharge to SNF.  Assessment & Plan    Severe 3 vessel CAD, plan for medical management: -continue aspirin, clopidogrel for antiplatelet therapy -will start atorvastatin 40 mg given plan for medical management. This can be further addressed post discharge depending on long term management plans. -would stop meloxicam and NSAIDs  Chronic systolic and diastolic heart failure: euvolemic today -continue metoprolol, will consolidate to succinate -continue losartan  -not on standing diuretics, monitor volume status -low sodium diet, minimal fluid restriction--his poor nutritional status needs to take priority, however, so would not over-restrict   Time Spent Directly with Patient: I have spent a total of >40 minutes with the patient reviewing hospital notes, telemetry, EKGs, labs and examining the patient as well as establishing an assessment and plan that was discussed personally with the patient.  > 50% of time was spent in direct patient care. Time includes planning for discharge.  Length of Stay:   LOS: 13 days   Jodelle Red, MD, PhD [facility]   11/10/2017, 1:37 PM   For questions or updates, please contact [facility] Please consult [email or web address] for contact info under Cardiology/STEMI.

## 2017-11-10 NOTE — Clinical Social Work Placement (Signed)
   CLINICAL SOCIAL WORK PLACEMENT  NOTE  Date:  11/10/2017  Patient Details  Name: Caleb Carpenter MRN: [id number] Date of Birth: September 25, 1955  Clinical Social Work is seeking post-discharge placement for this patient at the Skilled  Nursing Facility level of care (*CSW will initial, date and re-position this form in  chart as items are completed):  Yes   Patient/family provided with [facility] Clinical Social Work Department's list of facilities offering this level of care within the geographic area requested by the patient (or if unable, by the patient's family).  Yes   Patient/family informed of their freedom to choose among providers that offer the needed level of care, that participate in Medicare, Medicaid or managed care program needed by the patient, have an available bed and are willing to accept the patient.  Yes   Patient/family informed of [place]'s ownership interest in [facility] and [facility], as well as of the fact that they are under no obligation to receive care at these facilities.  PASRR submitted to EDS on       PASRR number received on       Existing PASRR number confirmed on 11/04/17     FL2 transmitted to all facilities in geographic area requested by pt/family on 11/04/17     FL2 transmitted to all facilities within larger geographic area on       Patient informed that his/her managed care company has contracts with or will negotiate with certain facilities, including the following:  [facility])     Yes   Patient/family informed of bed offers received.  Patient chooses bed at [facility]     Physician recommends and patient chooses bed at      Patient to be transferred to [facility]) on 11/10/17.  Patient to be transferred to facility by PTAR     Patient family notified on 11/10/17 of transfer.  Name of family member notified:         PHYSICIAN       Additional Comment:    _______________________________________________ Abigail Butts, LCSW 11/10/2017, 2:22 PM

## 2017-11-12 ENCOUNTER — Non-Acute Institutional Stay (SKILLED_NURSING_FACILITY): Payer: Medicaid Other | Admitting: Adult Health

## 2017-11-12 ENCOUNTER — Encounter: Payer: Self-pay | Admitting: Adult Health

## 2017-11-12 DIAGNOSIS — I110 Hypertensive heart disease with heart failure: Secondary | ICD-10-CM | POA: Diagnosis not present

## 2017-11-12 DIAGNOSIS — I2510 Atherosclerotic heart disease of native coronary artery without angina pectoris: Secondary | ICD-10-CM | POA: Diagnosis not present

## 2017-11-12 DIAGNOSIS — I2111 ST elevation (STEMI) myocardial infarction involving right coronary artery: Secondary | ICD-10-CM | POA: Diagnosis not present

## 2017-11-12 DIAGNOSIS — I5022 Chronic systolic (congestive) heart failure: Secondary | ICD-10-CM | POA: Diagnosis not present

## 2017-11-12 DIAGNOSIS — J449 Chronic obstructive pulmonary disease, unspecified: Secondary | ICD-10-CM

## 2017-11-12 DIAGNOSIS — E785 Hyperlipidemia, unspecified: Secondary | ICD-10-CM

## 2017-11-12 DIAGNOSIS — I2584 Coronary atherosclerosis due to calcified coronary lesion: Secondary | ICD-10-CM

## 2017-11-12 DIAGNOSIS — F1010 Alcohol abuse, uncomplicated: Secondary | ICD-10-CM

## 2017-11-12 NOTE — Progress Notes (Signed)
Location:   [facility] Room Number: 233 B Place of Service:  SNF (31)   CODE STATUS: Full Code  Allergies  Allergen Reactions  . Codeine Other (See Comments)    ams    Chief Complaint  Patient presents with  . Hospitalization Follow-up    Hospital Follow up    HPI:  He is a 62 year old man with has history of COPD: alcoholism; who has been hospitalized from 10-28-17 through 11-10-17. He had a STEMI. He had a cardiac cath done which shows an EF of 15%; and severe 3 vessel disease; noted to be diffusely diseased and severely calcified. He is not a candidate for a CABG. He will be treated medically. He is here for short term rehab with his goal to return back home. I doubt if he will be able to achieve that goal. He denies any chest pain; no cough no shortness of breath. He will continue to be followed for his chronic illnesses including: heart failure; cad; hypertension. There are no nursing concerns at this time.   Past Medical History:  Diagnosis Date  . Asthma   . COPD (chronic obstructive pulmonary disease) (HCC)   . Hypertension     Past Surgical History:  Procedure Laterality Date  . CATARACT EXTRACTION, BILATERAL Bilateral   . ESOPHAGOGASTRODUODENOSCOPY (EGD) WITH PROPOFOL N/A 06/21/2015   Procedure: ESOPHAGOGASTRODUODENOSCOPY (EGD) WITH PROPOFOL;  Surgeon: Midge Minium, MD;  Location: [facility] ENDOSCOPY;  Service: Endoscopy;  Laterality: N/A;  . LEFT HEART CATH AND CORONARY ANGIOGRAPHY N/A 10/28/2017   Procedure: LEFT HEART CATH AND CORONARY ANGIOGRAPHY;  Surgeon: [place]az[place], Peter M, MD;  Location: [facility] INVASIVE CV LAB;  Service: Cardiovascular;  Laterality: N/A;    Social History   Socioeconomic History  . Marital status: Legally Separated    Spouse name: Not on file  . Number of children: Not on file  . Years of education: 6  . Highest education level: 6th grade  Occupational History  . Occupation: retired  Engineer, production  . Financial resource strain: Very  hard  . Food insecurity:    Worry: Often true    Inability: Often true  . Transportation needs:    Medical: No    Non-medical: No  Tobacco Use  . Smoking status: Former Smoker    Types: Cigarettes    Last attempt to quit: 06/21/2014    Years since quitting: 3.3  . Smokeless tobacco: Current User    Types: Chew  Substance and Sexual Activity  . Alcohol use: Yes    Alcohol/week: 1.2 oz    Types: 2 Cans of beer per week    Comment: per day  . Drug use: No  . Sexual activity: Never  Lifestyle  . Physical activity:    Days per week: Not on file    Minutes per session: Not on file  . Stress: Not on file  Relationships  . Social connections:    Talks on phone: Not on file    Gets together: Not on file    Attends religious service: Not on file    Active member of club or organization: Not on file    Attends meetings of clubs or organizations: Not on file    Relationship status: Not on file  . Intimate partner violence:    Fear of current or ex partner: Not on file    Emotionally abused: Not on file    Physically abused: Not on file    Forced sexual activity:  Not on file  Other Topics Concern  . Not on file  Social History Narrative  . Not on file   Family History  Problem Relation Age of Onset  . Diabetes Unknown   . Hypertension Unknown       VITAL SIGNS BP 100/68   Pulse 68   Temp (!) 97.1 F (36.2 C)   Resp 18   Ht 5\' 5"  (1.651 m) Comment: received from Epic  Wt 108 lb 7 oz (49.2 kg) Comment: received from Epic  BMI 18.04 kg/m   Outpatient Encounter Medications as of 11/12/2017  Medication Sig  . acetaminophen (TYLENOL) 325 MG tablet Take 2 tablets (650 mg total) by mouth every 4 (four) hours as needed for headache or mild pain.  Marland Kitchen albuterol (PROVENTIL HFA;VENTOLIN HFA) 108 (90 Base) MCG/ACT inhaler Inhale 2 puffs into the lungs every 4 (four) hours as needed for wheezing.   Marland Kitchen aspirin 81 MG chewable tablet Chew 1 tablet (81 mg total) by mouth daily.  Marland Kitchen  atorvastatin (LIPITOR) 40 MG tablet Take 1 tablet (40 mg total) by mouth daily.  . clopidogrel (PLAVIX) 75 MG tablet Take 1 tablet (75 mg total) by mouth daily with breakfast.  . fluticasone (FLONASE) 50 MCG/ACT nasal spray Place 2 sprays into both nostrils daily.  . folic acid (FOLVITE) 1 MG tablet Take 1 tablet (1 mg total) by mouth daily.  Marland Kitchen losartan (COZAAR) 25 MG tablet Take 1 tablet (25 mg total) by mouth daily.  . metoprolol succinate (TOPROL XL) 50 MG 24 hr tablet Take 1 tablet (50 mg total) by mouth daily. Take with or immediately following a meal.  . Multiple Vitamin (MULTIVITAMIN WITH MINERALS) TABS tablet Take 1 tablet by mouth daily.  . nitroGLYCERIN (NITROSTAT) 0.4 MG SL tablet Place 1 tablet (0.4 mg total) under the tongue every 5 (five) minutes as needed for chest pain.  Marland Kitchen thiamine 100 MG tablet Take 100 mg by mouth daily.  . Tiotropium Bromide-Olodaterol (STIOLTO RESPIMAT) 2.5-2.5 MCG/ACT AERS Inhale 2 puffs into the lungs daily.  . traZODone (DESYREL) 50 MG tablet Take 50 mg by mouth at bedtime.   No facility-administered encounter medications on file as of 11/12/2017.      SIGNIFICANT DIAGNOSTIC EXAMS  TODAY:   10-28-17: 2-d echo: - Left ventricle: The cavity size was normal. Systolic function was severely reduced. The estimated ejection fraction was in the range of 20% to 25%. There is akinesis of the mid-apicalanterior, anterolateral, lateral, inferolateral, inferior, and apical myocardium. The study is not technically sufficient to allow evaluation of LV diastolic function. - Mitral valve: There was mild regurgitation. - Right ventricle: Systolic function was mildly reduced. - Tricuspid valve: There was moderate regurgitation. - Pulmonary arteries: PA peak pressure: 71 mm Hg (S). Impressions: - The right ventricular systolic pressure was increased consistent with severe pulmonary hypertension.  10-28-17: left heart cath:  1. Severe 3 vessel obstructive CAD. Vessels are  diffusely diseased and severely calcified. 2. Severe LV dysfunction. Sparing of the base with otherwise diffuse akinesia. EF 15%.  3. Mildly elevated LVEDP  10-28-17: chest x-ray: 1. COPD and pleuroparenchymal scarring. Small left pleural effusion cannot be excluded. 2.  Cardiomegaly with mild pulmonary venous congestion. 3. Carotid and bilateral upper extremity peripheral vascular Disease.  10-29-17: chest x-ray: Increased bilateral lower lung opacity superimposed on severe emphysema. This is suspicious for superimposed pulmonary edema or infection. New tiny right pleural effusion.  LABS REVIEWED: TODAY:   10-28-17: wbc 21.9; hgb 12.1; hct 38.5;  mcv 103.2; plt 258; glucose 146; bun 9; creat 1.10; k+ 4.8; na++ 136; ca 8.6; ast 177; albumin 2.9  10-30-17: wbc 9.8; hgb 10.2; hct 32.0; mcv 102.2; plt 165 glucose 76; bun 5; creat 0.64; k+ 3.2; na++ 139; ca 8.1 ast 270; alt 62; albumin 2.3  11-06-17: wbc 7.0' hgb 10.2; hct 31.7; mcv 101.9; plt 272; glucose 110; bun 7; creat 0.60 ;k+ 3.8; na++ 134 ca 8.7    Review of Systems  Constitutional: Negative for malaise/fatigue.  Respiratory: Negative for cough and shortness of breath.   Cardiovascular: Negative for chest pain, palpitations and leg swelling.  Gastrointestinal: Negative for abdominal pain, constipation and heartburn.  Musculoskeletal: Negative for back pain, joint pain and myalgias.  Skin: Negative.   Neurological: Negative for dizziness.  Psychiatric/Behavioral: The patient is not nervous/anxious.     Physical Exam  Constitutional: No distress.  Cachexia   Neck: No thyromegaly present.  Cardiovascular: Normal rate, regular rhythm and intact distal pulses.  Murmur heard. 1/6  Pulmonary/Chest: Effort normal and breath sounds normal. No respiratory distress.  Abdominal: Soft. Bowel sounds are normal. He exhibits no distension. There is no tenderness.  Musculoskeletal: Normal range of motion. He exhibits no edema.  Lymphadenopathy:     He has no cervical adenopathy.  Neurological: He is alert.  Is confused   Skin: Skin is warm and dry. He is not diaphoretic.  Psychiatric: He has a normal mood and affect.      ASSESSMENT/ PLAN:  TODAY:   1. CAD due to calcified coronary lesion: has 3 vessel disease status post STEMI : not a surgical candidate: is stable no complaints of chest pain: will continue asa 81 mg daily has prn ntg  and plavix 75 mg daily is on statin and beta blocker  2. Chronic systolic heart failure EF 15% (cardiac cath 10-28-17) is stable will continue toprol xl 50 mg daily   3.  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 COPD with asthma: is stable will continue albuterol 2 puffs every 4 hours as needed; flonase daily and stiolto respimat 2.5-2.5 mcg 2 puffs daily   5. Dyslipidemia: is stable will continue lipitor 40 mg daily   6. Alcohol abuse: is presently stable will continue folic acid and thiamine supplements.    Will have him to be seen by palliative care.    MD is aware of resident's narcotic use and is in agreement with current plan of care. We will attempt to wean resident as apropriate   Synthia Innocent NP [facility] Adult [facility]  Contact [phone] Monday through Friday 8am- 5pm  After hours call [phone]

## 2017-11-13 ENCOUNTER — Non-Acute Institutional Stay (SKILLED_NURSING_FACILITY): Payer: Medicaid Other | Admitting: Internal Medicine

## 2017-11-13 ENCOUNTER — Encounter: Payer: Self-pay | Admitting: Internal Medicine

## 2017-11-13 DIAGNOSIS — R945 Abnormal results of liver function studies: Secondary | ICD-10-CM

## 2017-11-13 DIAGNOSIS — E43 Unspecified severe protein-calorie malnutrition: Secondary | ICD-10-CM

## 2017-11-13 DIAGNOSIS — R059 Cough, unspecified: Secondary | ICD-10-CM

## 2017-11-13 DIAGNOSIS — I2510 Atherosclerotic heart disease of native coronary artery without angina pectoris: Secondary | ICD-10-CM | POA: Diagnosis not present

## 2017-11-13 DIAGNOSIS — I5022 Chronic systolic (congestive) heart failure: Secondary | ICD-10-CM

## 2017-11-13 DIAGNOSIS — I219 Acute myocardial infarction, unspecified: Secondary | ICD-10-CM

## 2017-11-13 DIAGNOSIS — F1010 Alcohol abuse, uncomplicated: Secondary | ICD-10-CM

## 2017-11-13 DIAGNOSIS — J449 Chronic obstructive pulmonary disease, unspecified: Secondary | ICD-10-CM

## 2017-11-13 DIAGNOSIS — R05 Cough: Secondary | ICD-10-CM | POA: Diagnosis not present

## 2017-11-13 DIAGNOSIS — I200 Unstable angina: Secondary | ICD-10-CM

## 2017-11-13 DIAGNOSIS — I2584 Coronary atherosclerosis due to calcified coronary lesion: Secondary | ICD-10-CM

## 2017-11-13 DIAGNOSIS — R7989 Other specified abnormal findings of blood chemistry: Secondary | ICD-10-CM

## 2017-11-13 NOTE — Progress Notes (Signed)
Patient ID: Caleb Carpenter, male   DOB: 12/13/1955, 62 y.o.   MRN: [id number]  Provider:  DR Elmon Kirschner Location:  [facility]   Place of Service:  SNF (31)  PCP: Evelene Croon, MD Patient Care Team: Evelene Croon, MD as PCP - General (Family Medicine) Midge Minium, MD as Consulting Physician (Gastroenterology) Delma Freeze, FNP as Nurse Practitioner [facility])  Extended Emergency Contact Information Primary Emergency Contact: Chalker,Pete Address: [address]          [place], [place] [address] [place] of [place] Home Phone: [phone] Mobile Phone: [phone] Relation: Brother Secondary Emergency Contact: Claudette Stapler [place] of [place] Home Phone: [phone] Relation: Other  Code Status: FULL CODE Goals of Care: Advanced Directive information Advanced Directives 11/12/2017  Does Patient Have a Medical Advance Directive? No  Would patient like information on creating a medical advance directive? No - Patient declined      Chief Complaint  Patient presents with  . New Admit To SNF    from hospital    HPI: Patient is a 62 y.o. male seen today for admission to SNF following hospital stay for STEMI, Etoh abuse, CAD, generalized weakness, chronic systolic HF, severe protein calorie malnutrition, ICM, COPD/asth,a, pulmonary HTN. He presented to the ED with ST elevation inferior and anterior leads on ECG. He was taken to the cath lab for emergent heart cath and was found to have 3 vessel obstruction of LAD, not amenable to stent; EF 15% with severe LV dysfunction and akinesis. He was not a candidate for CABG 2/2 multiple comorbidities and palliative care consulted 2/2 poor prognosis. WBC 21.9k-->7k; Hgb 10.2; BNP 3014.6; troponin peaked at 0.71; albumin 2.3; AST 270; ALT 62. He presents to SNF for short term rehab.  Today he reports intermittent central CP. No N/V. He has a cough. No nursing issues. No falls. Appetite reduced and  sleeps well. He takes ASA and plavix for anticoagulation   Chronic systolic heart failure - EF 15% (cardiac cath 10-28-17) with BNP 3014.6; takes toprol xl 50 mg daily. Followed by cardio  HTN - stable on toprol xl 50 mg daily and cozaar 25 mg daily   COPD with asthma - stable on albuterol 2 puffs every 4 hours as needed; flonase daily; stiolto respimat 2.5-2.5 mcg 2 puffs daily   Dyslipidemia - stable on lipitor 40 mg daily   Alcohol abuse - ongoing; takes folic acid and thiamine supplements.     Past Medical History:  Diagnosis Date  . Asthma   . COPD (chronic obstructive pulmonary disease) (HCC)   . Hypertension    Past Surgical History:  Procedure Laterality Date  . CATARACT EXTRACTION, BILATERAL Bilateral   . ESOPHAGOGASTRODUODENOSCOPY (EGD) WITH PROPOFOL N/A 06/21/2015   Procedure: ESOPHAGOGASTRODUODENOSCOPY (EGD) WITH PROPOFOL;  Surgeon: Midge Minium, MD;  Location: [facility] ENDOSCOPY;  Service: Endoscopy;  Laterality: N/A;  . LEFT HEART CATH AND CORONARY ANGIOGRAPHY N/A 10/28/2017   Procedure: LEFT HEART CATH AND CORONARY ANGIOGRAPHY;  Surgeon: Swaziland, Peter M, MD;  Location: [facility] INVASIVE CV LAB;  Service: Cardiovascular;  Laterality: N/A;    reports that he quit smoking about 3 years ago. His smoking use included cigarettes. His smokeless tobacco use includes chew. He reports that he drinks about 1.2 oz of alcohol per week. He reports that he does not use drugs. Social History   Socioeconomic History  . Marital status: Legally Separated    Spouse name: Not on file  . Number of children:  Not on file  . Years of education: 6  . Highest education level: 6th grade  Occupational History  . Occupation: retired  Engineer, production  . Financial resource strain: Very hard  . Food insecurity:    Worry: Often true    Inability: Often true  . Transportation needs:    Medical: No    Non-medical: No  Tobacco Use  . Smoking status: Former Smoker    Types: Cigarettes    Last attempt to  quit: 06/21/2014    Years since quitting: 3.4  . Smokeless tobacco: Current User    Types: Chew  Substance and Sexual Activity  . Alcohol use: Yes    Alcohol/week: 1.2 oz    Types: 2 Cans of beer per week    Comment: per day  . Drug use: No  . Sexual activity: Never  Lifestyle  . Physical activity:    Days per week: Not on file    Minutes per session: Not on file  . Stress: Not on file  Relationships  . Social connections:    Talks on phone: Not on file    Gets together: Not on file    Attends religious service: Not on file    Active member of club or organization: Not on file    Attends meetings of clubs or organizations: Not on file    Relationship status: Not on file  . Intimate partner violence:    Fear of current or ex partner: Not on file    Emotionally abused: Not on file    Physically abused: Not on file    Forced sexual activity: Not on file  Other Topics Concern  . Not on file  Social History Narrative  . Not on file    Functional Status Survey:    Family History  Problem Relation Age of Onset  . Diabetes Unknown   . Hypertension Unknown     Health Maintenance  Topic Date Due  . INFLUENZA VACCINE  11/13/2017  . COLONOSCOPY  01/12/2018 (Originally 02/01/2004)  . TETANUS/TDAP  01/12/2018 (Originally 01/31/1973)  . Hepatitis C Screening  01/12/2018 (Originally 1955/09/18)  . HIV Screening  Completed    Allergies  Allergen Reactions  . Codeine Other (See Comments)    ams    Outpatient Encounter Medications as of 11/13/2017  Medication Sig  . acetaminophen (TYLENOL) 325 MG tablet Take 2 tablets (650 mg total) by mouth every 4 (four) hours as needed for headache or mild pain.  Marland Kitchen albuterol (PROVENTIL HFA;VENTOLIN HFA) 108 (90 Base) MCG/ACT inhaler Inhale 2 puffs into the lungs every 4 (four) hours as needed for wheezing.   Marland Kitchen aspirin 81 MG chewable tablet Chew 1 tablet (81 mg total) by mouth daily.  Marland Kitchen atorvastatin (LIPITOR) 40 MG tablet Take 1 tablet (40  mg total) by mouth daily.  . clopidogrel (PLAVIX) 75 MG tablet Take 1 tablet (75 mg total) by mouth daily with breakfast.  . fluticasone (FLONASE) 50 MCG/ACT nasal spray Place 2 sprays into both nostrils daily.  . folic acid (FOLVITE) 1 MG tablet Take 1 tablet (1 mg total) by mouth daily.  Marland Kitchen losartan (COZAAR) 25 MG tablet Take 1 tablet (25 mg total) by mouth daily.  . metoprolol succinate (TOPROL XL) 50 MG 24 hr tablet Take 1 tablet (50 mg total) by mouth daily. Take with or immediately following a meal.  . Multiple Vitamin (MULTIVITAMIN WITH MINERALS) TABS tablet Take 1 tablet by mouth daily.  . nitroGLYCERIN (NITROSTAT) 0.4 MG SL  tablet Place 1 tablet (0.4 mg total) under the tongue every 5 (five) minutes as needed for chest pain.  Marland Kitchen thiamine 100 MG tablet Take 100 mg by mouth daily.  . Tiotropium Bromide-Olodaterol (STIOLTO RESPIMAT) 2.5-2.5 MCG/ACT AERS Inhale 2 puffs into the lungs daily.  . traZODone (DESYREL) 50 MG tablet Take 50 mg by mouth at bedtime.   No facility-administered encounter medications on file as of 11/13/2017.     Review of Systems  Constitutional: Positive for appetite change (loss) and fatigue.  Respiratory: Positive for cough.   Cardiovascular: Positive for chest pain.  Gastrointestinal: Positive for abdominal pain.  All other systems reviewed and are negative.   Vitals:   11/13/17 1425  BP: 100/68  Pulse: 68  Temp: (!) 97.1 F (36.2 C)   There is no height or weight on file to calculate BMI. Physical Exam  Constitutional: He appears cachectic. He has a sickly appearance.  Frail appearing in NAD, thin  HENT:  Mouth/Throat: Oropharynx is clear and moist.  MMM; no oral thrush  Eyes: Pupils are equal, round, and reactive to light. No scleral icterus.  Neck: Neck supple. Carotid bruit is not present. No thyromegaly present.  Cardiovascular: Regular rhythm and intact distal pulses. Bradycardia present. Exam reveals no gallop and no friction rub.  Murmur  heard.  Systolic murmur is present with a grade of 1/6. no distal LE swelling. No calf TTP  Pulmonary/Chest: Effort normal and breath sounds normal. He has no wheezes. He has no rales. He exhibits no tenderness.  Reduced BS L>R at base  Abdominal: Soft. Bowel sounds are normal. He exhibits distension. He exhibits no abdominal bruit, no pulsatile midline mass and no mass. There is hepatomegaly. There is tenderness. There is no rebound and no guarding.  Musculoskeletal: He exhibits edema.  Lymphadenopathy:    He has no cervical adenopathy.  Neurological: He is alert. He has normal reflexes.  No signs of DTs  Skin: Skin is warm and dry. No rash noted.  Psychiatric: He has a normal mood and affect. His behavior is normal. Thought content normal.    Labs reviewed: Basic Metabolic Panel: Recent Labs    10/31/17 0325 11/01/17 0224 11/06/17 0428  NA 141 136 134*  K 3.9 3.9 3.8  CL 103 95* 99  CO2 33* 34* 27  GLUCOSE 98 109* 110*  BUN 5* 11 7*  CREATININE 0.62 0.54* 0.60*  CALCIUM 8.3* 8.5* 8.7*   Liver Function Tests: Recent Labs    10/26/17 1827 10/28/17 1052 10/30/17 0844  AST 26 177* 270*  ALT 8 24 62*  ALKPHOS 42 49 39  BILITOT 0.4 1.0 0.9  PROT 5.2* 5.8* 4.4*  ALBUMIN 2.5* 2.9* 2.3*   Recent Labs    05/03/17 1743 10/26/17 1827  LIPASE 29 48   No results for input(s): AMMONIA in the last 8760 hours. CBC: Recent Labs    01/30/17 1820  05/03/17 1743  10/30/17 0844 10/31/17 0325 11/06/17 0428  WBC 13.2*   < > 7.8   < > 9.8 6.9 7.0  NEUTROABS 10.7*  --  4.6  --   --   --   --   HGB 13.4   < > 12.3*   < > 10.2* 9.9* 10.2*  HCT 40.1   < > 37.2*   < > 32.0* 32.2* 31.7*  MCV 96.0   < > 100.5*   < > 102.2* 102.9* 101.9*  PLT 232   < > 144*   < >  165 157 272   < > = values in this interval not displayed.   Cardiac Enzymes: Recent Labs    10/26/17 1827 10/26/17 2106 10/29/17 0722  TROPONINI <0.03 <0.03 0.71*   BNP: Invalid input(s): POCBNP Lab Results    Component Value Date   HGBA1C 4.8 04/01/2017   Lab Results  Component Value Date   TSH 0.675 04/01/2017   No results found for: VITAMINB12 No results found for: FOLATE No results found for: IRON, TIBC, FERRITIN  Imaging and Procedures obtained prior to SNF admission: Dg Chest Port 1 View  Result Date: 10/29/2017 CLINICAL DATA:  Shortness of breath.  COPD. EXAM: PORTABLE CHEST 1 VIEW COMPARISON:  10/28/2017 FINDINGS: Heart size remains within normal limits.  Aortic atherosclerosis. Pulmonary emphysema again demonstrated. Increased opacity is seen in both lung bases, which may be due to superimposed pulmonary edema or infection. New tiny right pleural effusion is seen. IMPRESSION: Increased bilateral lower lung opacity superimposed on severe emphysema. This is suspicious for superimposed pulmonary edema or infection. New tiny right pleural effusion. Electronically Signed   By: Myles Rosenthal M.D.   On: 10/29/2017 10:52   Dg Chest Port 1 View  Result Date: 10/28/2017 CLINICAL DATA:  STEMI. Dry cough. Shortness of breath. Chest pain. EXAM: PORTABLE CHEST 1 VIEW COMPARISON:  10/26/2017, 04/30/2017.  CT 04/30/2017. FINDINGS: Mediastinum and hilar structures are normal. COPD. Bilateral pleural-parenchymal thickening again noted consistent with scarring. Reference is made to recent CT report of 04/30/2017 for discussion of pulmonary nodules noted. Tiny left pleural effusion. No pneumothorax. Borderline cardiomegaly with mild pulmonary venous congestion. Carotid vascular and bilateral upper extremity peripheral vascular calcification. Old right non healed femoral neck fracture. IMPRESSION: 1. COPD and pleuroparenchymal scarring. Small left pleural effusion cannot be excluded. 2.  Cardiomegaly with mild pulmonary venous congestion. 3. Carotid and bilateral upper extremity peripheral vascular disease. Electronically Signed   By: Maisie Fus  Register   On: 10/28/2017 11:22    Assessment/Plan   ICD-10-CM   1.  Unstable angina pectoris (HCC) I20.0   2. Coronary artery disease due to calcified coronary lesion I25.10    I25.84    3 vessel per cardiac cath in July 2019  3. Protein-calorie malnutrition, severe E43   4. Cough R05   5. Chronic obstructive pulmonary disease, unspecified COPD type (HCC) J44.9   6. Chronic systolic heart failure (HCC) I50.22   7. Myocardial infarction less than 4 weeks ago (HCC) I21.9    s/p STEMI treated conservatively as he is not a CABG candidate due to comorbidities  8. Alcohol abuse F10.10   9. Abnormal LFTs R94.5    Palliative care to follow  Use SLNTG q49min x 3 prn CP  Cont current meds as ordered  PT/Ot/ST as ordered  Start ensure plus TID with meals  Etoh abuse use discussed and cessation highly urged  F/u with cardio  GOAL: short term rehab with potential for long term care. Overall prognosis is poor. Communicated with pt and nursing.  Labs/tests ordered: cmp; stat CXR   Jeilani Grupe S. Ancil Linsey  [facility] [address] [place], [place] [address] [phone] Cell (Monday-Friday 8 AM - 5 PM) [phone] After 5 PM and follow prompts

## 2017-11-19 ENCOUNTER — Non-Acute Institutional Stay (SKILLED_NURSING_FACILITY): Payer: Medicaid Other | Admitting: Adult Health

## 2017-11-19 DIAGNOSIS — J449 Chronic obstructive pulmonary disease, unspecified: Secondary | ICD-10-CM | POA: Diagnosis not present

## 2017-11-19 DIAGNOSIS — I110 Hypertensive heart disease with heart failure: Secondary | ICD-10-CM

## 2017-11-19 DIAGNOSIS — I5022 Chronic systolic (congestive) heart failure: Secondary | ICD-10-CM

## 2017-11-19 DIAGNOSIS — I2510 Atherosclerotic heart disease of native coronary artery without angina pectoris: Secondary | ICD-10-CM

## 2017-11-19 DIAGNOSIS — I2584 Coronary atherosclerosis due to calcified coronary lesion: Secondary | ICD-10-CM

## 2017-11-20 ENCOUNTER — Encounter: Payer: Self-pay | Admitting: Adult Health

## 2017-11-20 NOTE — Progress Notes (Signed)
Location:   [facility] Room Number: 233 B Place of Service:  SNF (31)   CODE STATUS: Full Code  Allergies  Allergen Reactions  . Codeine Other (See Comments)    ams    Chief Complaint  Patient presents with  . Medical Management of Chronic Issues    Hypertensive heart disease; copd with asthma; systolic heart failure; cad. Weekly follow up for the 30 day post hospitalization     HPI:  He is a 62 year old rehab patient being seen for the management of his chronic illnesses; hypertensive heart disease; copd with asthma; systolic heart failure; cad. He denies any chest pain or shortness of breath. He denies any cough. There are no nursing concerns at this time.   Past Medical History:  Diagnosis Date  . Asthma   . COPD (chronic obstructive pulmonary disease) (HCC)   . Hypertension     Past Surgical History:  Procedure Laterality Date  . CATARACT EXTRACTION, BILATERAL Bilateral   . ESOPHAGOGASTRODUODENOSCOPY (EGD) WITH PROPOFOL N/A 06/21/2015   Procedure: ESOPHAGOGASTRODUODENOSCOPY (EGD) WITH PROPOFOL;  Surgeon: Midge Minium, MD;  Location: [facility] ENDOSCOPY;  Service: Endoscopy;  Laterality: N/A;  . LEFT HEART CATH AND CORONARY ANGIOGRAPHY N/A 10/28/2017   Procedure: LEFT HEART CATH AND CORONARY ANGIOGRAPHY;  Surgeon: Swaziland, Peter M, MD;  Location: [facility] INVASIVE CV LAB;  Service: Cardiovascular;  Laterality: N/A;    Social History   Socioeconomic History  . Marital status: Legally Separated    Spouse name: Not on file  . Number of children: Not on file  . Years of education: 6  . Highest education level: 6th grade  Occupational History  . Occupation: retired  Engineer, production  . Financial resource strain: Very hard  . Food insecurity:    Worry: Often true    Inability: Often true  . Transportation needs:    Medical: No    Non-medical: No  Tobacco Use  . Smoking status: Former Smoker    Types: Cigarettes    Last attempt to quit: 06/21/2014    Years since  quitting: 3.4  . Smokeless tobacco: Current User    Types: Chew  Substance and Sexual Activity  . Alcohol use: Yes    Alcohol/week: 2.0 standard drinks    Types: 2 Cans of beer per week    Comment: per day  . Drug use: No  . Sexual activity: Never  Lifestyle  . Physical activity:    Days per week: Not on file    Minutes per session: Not on file  . Stress: Not on file  Relationships  . Social connections:    Talks on phone: Not on file    Gets together: Not on file    Attends religious service: Not on file    Active member of club or organization: Not on file    Attends meetings of clubs or organizations: Not on file    Relationship status: Not on file  . Intimate partner violence:    Fear of current or ex partner: Not on file    Emotionally abused: Not on file    Physically abused: Not on file    Forced sexual activity: Not on file  Other Topics Concern  . Not on file  Social History Narrative  . Not on file   Family History  Problem Relation Age of Onset  . Diabetes Unknown   . Hypertension Unknown       VITAL SIGNS BP 120/80   Pulse  78   Temp 98.1 F (36.7 C)   Resp 20   Ht 5\' 5"  (1.651 m)   Wt 93 lb 9.6 oz (42.5 kg)   SpO2 98%   BMI 15.58 kg/m   Outpatient Encounter Medications as of 11/19/2017  Medication Sig  . acetaminophen (TYLENOL) 325 MG tablet Take 2 tablets (650 mg total) by mouth every 4 (four) hours as needed for headache or mild pain.  Marland Kitchen albuterol (PROVENTIL HFA;VENTOLIN HFA) 108 (90 Base) MCG/ACT inhaler Inhale 2 puffs into the lungs every 4 (four) hours as needed for wheezing.   Marland Kitchen aspirin 81 MG chewable tablet Chew 1 tablet (81 mg total) by mouth daily.  Marland Kitchen atorvastatin (LIPITOR) 40 MG tablet Take 1 tablet (40 mg total) by mouth daily.  . clopidogrel (PLAVIX) 75 MG tablet Take 1 tablet (75 mg total) by mouth daily with breakfast.  . ENSURE PLUS (ENSURE PLUS) LIQD Take 237 mLs by mouth 3 (three) times daily.  . fluticasone (FLONASE) 50 MCG/ACT  nasal spray Place 2 sprays into both nostrils daily.  . folic acid (FOLVITE) 1 MG tablet Take 1 tablet (1 mg total) by mouth daily.  Marland Kitchen losartan (COZAAR) 25 MG tablet Take 1 tablet (25 mg total) by mouth daily.  . metoprolol succinate (TOPROL XL) 50 MG 24 hr tablet Take 1 tablet (50 mg total) by mouth daily. Take with or immediately following a meal.  . Multiple Vitamin (MULTIVITAMIN WITH MINERALS) TABS tablet Take 1 tablet by mouth daily.  . nitroGLYCERIN (NITROSTAT) 0.4 MG SL tablet Place 1 tablet (0.4 mg total) under the tongue every 5 (five) minutes as needed for chest pain.  . Nutritional Supplements (NUTRITIONAL SUPPLEMENT PO) HSG NAS Diet - Regular texture, Regular / Thin consistency  . ondansetron (ZOFRAN) 4 MG tablet Take 4 mg by mouth every 6 (six) hours as needed for nausea or vomiting.  . thiamine 100 MG tablet Take 100 mg by mouth daily.  . Tiotropium Bromide-Olodaterol (STIOLTO RESPIMAT) 2.5-2.5 MCG/ACT AERS Inhale 2 puffs into the lungs daily.  . traZODone (DESYREL) 50 MG tablet Take 50 mg by mouth at bedtime.   No facility-administered encounter medications on file as of 11/19/2017.      SIGNIFICANT DIAGNOSTIC EXAMS  PREVIOUS:   10-28-17: 2-d echo: - Left ventricle: The cavity size was normal. Systolic function was severely reduced. The estimated ejection fraction was in the range of 20% to 25%. There is akinesis of the mid-apicalanterior, anterolateral, lateral, inferolateral, inferior, and apical myocardium. The study is not technically sufficient to allow evaluation of LV diastolic function. - Mitral valve: There was mild regurgitation. - Right ventricle: Systolic function was mildly reduced. - Tricuspid valve: There was moderate regurgitation. - Pulmonary arteries: PA peak pressure: 71 mm Hg (S). Impressions: - The right ventricular systolic pressure was increased consistent with severe pulmonary hypertension.  10-28-17: left heart cath:  1. Severe 3 vessel obstructive  CAD. Vessels are diffusely diseased and severely calcified. 2. Severe LV dysfunction. Sparing of the base with otherwise diffuse akinesia. EF 15%.  3. Mildly elevated LVEDP  10-28-17: chest x-ray: 1. COPD and pleuroparenchymal scarring. Small left pleural effusion cannot be excluded. 2.  Cardiomegaly with mild pulmonary venous congestion. 3. Carotid and bilateral upper extremity peripheral vascular Disease.  10-29-17: chest x-ray: Increased bilateral lower lung opacity superimposed on severe emphysema. This is suspicious for superimposed pulmonary edema or infection. New tiny right pleural effusion.  NO NEW EXAMS   LABS REVIEWED: PREVIOUS :   10-28-17: wbc  21.9; hgb 12.1; hct 38.5; mcv 103.2; plt 258; glucose 146; bun 9; creat 1.10; k+ 4.8; na++ 136; ca 8.6; ast 177; albumin 2.9  10-30-17: wbc 9.8; hgb 10.2; hct 32.0; mcv 102.2; plt 165 glucose 76; bun 5; creat 0.64; k+ 3.2; na++ 139; ca 8.1 ast 270; alt 62; albumin 2.3  11-06-17: wbc 7.0' hgb 10.2; hct 31.7; mcv 101.9; plt 272; glucose 110; bun 7; creat 0.60 ;k+ 3.8; na++ 134 ca 8.7   NO NEW LABS   Review of Systems  Constitutional: Positive for malaise/fatigue.  Respiratory: Negative for cough and shortness of breath.   Cardiovascular: Negative for chest pain, palpitations and leg swelling.  Gastrointestinal: Negative for abdominal pain, constipation and heartburn.  Musculoskeletal: Negative for back pain, joint pain and myalgias.  Skin: Negative.   Neurological: Negative for dizziness.  Psychiatric/Behavioral: The patient is not nervous/anxious.     Physical Exam  Constitutional: No distress.  Cachexia   Neck: No thyromegaly present.  Cardiovascular: Normal rate, regular rhythm and intact distal pulses.  Murmur heard. 1/6  Pulmonary/Chest: Effort normal and breath sounds normal. No respiratory distress.  Abdominal: Soft. Bowel sounds are normal. He exhibits no distension. There is no tenderness.  Musculoskeletal: Normal range  of motion. He exhibits no edema.  Muscle wasting present   Lymphadenopathy:    He has no cervical adenopathy.  Neurological: He is alert.  Is confused   Skin: Skin is warm and dry. He is not diaphoretic.  Psychiatric: He has a normal mood and affect.      ASSESSMENT/ PLAN:  TODAY:   1. CAD due to calcified coronary lesion: has 3 vessel disease status post STEMI : not a surgical candidate: is stable no complaints of chest pain: will continue asa 81 mg daily has prn ntg  and plavix 75 mg daily is on statin and beta blocker  2. Chronic systolic heart failure EF 15% (cardiac cath 10-28-17) is stable will continue toprol xl 50 mg daily   3.  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 COPD with asthma: is stable will continue albuterol 2 puffs every 4 hours as needed; flonase daily and stiolto respimat 2.5-2.5 mcg 2 puffs daily   PREVIOUS  5. Dyslipidemia: is stable will continue lipitor 40 mg daily   6. Alcohol abuse: is presently stable will continue folic acid and thiamine supplements.        MD is aware of resident's narcotic use and is in agreement with current plan of care. We will attempt to wean resident as apropriate   Synthia Innocent NP [facility]  Contact [phone] Monday through Friday 8am- 5pm  After hours call [phone]

## 2017-11-21 ENCOUNTER — Non-Acute Institutional Stay: Payer: Medicaid Other | Admitting: Internal Medicine

## 2017-11-22 DIAGNOSIS — J449 Chronic obstructive pulmonary disease, unspecified: Secondary | ICD-10-CM | POA: Insufficient documentation

## 2017-11-22 DIAGNOSIS — I110 Hypertensive heart disease with heart failure: Secondary | ICD-10-CM

## 2017-11-22 DIAGNOSIS — I5022 Chronic systolic (congestive) heart failure: Secondary | ICD-10-CM

## 2017-11-22 DIAGNOSIS — E785 Hyperlipidemia, unspecified: Secondary | ICD-10-CM | POA: Insufficient documentation

## 2017-11-22 HISTORY — DX: Hypertensive heart disease with heart failure: I11.0

## 2017-11-22 HISTORY — DX: Chronic systolic (congestive) heart failure: I50.22

## 2017-11-27 ENCOUNTER — Encounter: Payer: Self-pay | Admitting: Adult Health

## 2017-11-27 ENCOUNTER — Non-Acute Institutional Stay (SKILLED_NURSING_FACILITY): Payer: Medicaid Other | Admitting: Adult Health

## 2017-11-27 DIAGNOSIS — E785 Hyperlipidemia, unspecified: Secondary | ICD-10-CM

## 2017-11-27 DIAGNOSIS — J449 Chronic obstructive pulmonary disease, unspecified: Secondary | ICD-10-CM | POA: Diagnosis not present

## 2017-11-27 DIAGNOSIS — F1010 Alcohol abuse, uncomplicated: Secondary | ICD-10-CM | POA: Diagnosis not present

## 2017-11-27 NOTE — Progress Notes (Signed)
Location:   [facility] Room Number: 233 B Place of Service:  SNF (31)   CODE STATUS: Full Code  Allergies  Allergen Reactions  . Codeine Other (See Comments)    ams    Chief Complaint  Patient presents with  . Medical Management of Chronic Issues    Copd with asthma; alcohol abuse; dyslipidemia. Weekly follow up for the first 30 days post hospitalization     HPI:  He is a 62 year old rehab patient being seen for the management of his chronic illnesses: copd with asthma; alcohol abuse; dyslipidemia. He tells me that he is not having chest pain; no cough; no shortness of breath. There are no nursing concerns at this time.   Past Medical History:  Diagnosis Date  . Asthma   . COPD (chronic obstructive pulmonary disease) (HCC)   . Hypertension     Past Surgical History:  Procedure Laterality Date  . CATARACT EXTRACTION, BILATERAL Bilateral   . ESOPHAGOGASTRODUODENOSCOPY (EGD) WITH PROPOFOL N/A 06/21/2015   Procedure: ESOPHAGOGASTRODUODENOSCOPY (EGD) WITH PROPOFOL;  Surgeon: Midge Minium, MD;  Location: [facility] ENDOSCOPY;  Service: Endoscopy;  Laterality: N/A;  . LEFT HEART CATH AND CORONARY ANGIOGRAPHY N/A 10/28/2017   Procedure: LEFT HEART CATH AND CORONARY ANGIOGRAPHY;  Surgeon: Swaziland, Peter M, MD;  Location: [facility] INVASIVE CV LAB;  Service: Cardiovascular;  Laterality: N/A;    Social History   Socioeconomic History  . Marital status: Legally Separated    Spouse name: Not on file  . Number of children: Not on file  . Years of education: 6  . Highest education level: 6th grade  Occupational History  . Occupation: retired  Engineer, production  . Financial resource strain: Very hard  . Food insecurity:    Worry: Often true    Inability: Often true  . Transportation needs:    Medical: No    Non-medical: No  Tobacco Use  . Smoking status: Former Smoker    Types: Cigarettes    Last attempt to quit: 06/21/2014    Years since quitting: 3.4  . Smokeless tobacco:  Current User    Types: Chew  Substance and Sexual Activity  . Alcohol use: Yes    Alcohol/week: 2.0 standard drinks    Types: 2 Cans of beer per week    Comment: per day  . Drug use: No  . Sexual activity: Never  Lifestyle  . Physical activity:    Days per week: Not on file    Minutes per session: Not on file  . Stress: Not on file  Relationships  . Social connections:    Talks on phone: Not on file    Gets together: Not on file    Attends religious service: Not on file    Active member of club or organization: Not on file    Attends meetings of clubs or organizations: Not on file    Relationship status: Not on file  . Intimate partner violence:    Fear of current or ex partner: Not on file    Emotionally abused: Not on file    Physically abused: Not on file    Forced sexual activity: Not on file  Other Topics Concern  . Not on file  Social History Narrative  . Not on file   Family History  Problem Relation Age of Onset  . Diabetes Unknown   . Hypertension Unknown       VITAL SIGNS BP 102/76   Pulse 64   Temp Marland Kitchen)  97.5 F (36.4 C)   Resp 18   Ht 5\' 5"  (1.651 m)   Wt 95 lb 3.2 oz (43.2 kg)   SpO2 98%   BMI 15.84 kg/m   Outpatient Encounter Medications as of 11/27/2017  Medication Sig  . acetaminophen (TYLENOL) 325 MG tablet Take 2 tablets (650 mg total) by mouth every 4 (four) hours as needed for headache or mild pain.  Marland Kitchen albuterol (PROVENTIL HFA;VENTOLIN HFA) 108 (90 Base) MCG/ACT inhaler Inhale 2 puffs into the lungs every 4 (four) hours as needed for wheezing.   Marland Kitchen aspirin 81 MG chewable tablet Chew 1 tablet (81 mg total) by mouth daily.  Marland Kitchen atorvastatin (LIPITOR) 40 MG tablet Take 1 tablet (40 mg total) by mouth daily.  . clopidogrel (PLAVIX) 75 MG tablet Take 1 tablet (75 mg total) by mouth daily with breakfast.  . ENSURE PLUS (ENSURE PLUS) LIQD Take 237 mLs by mouth 3 (three) times daily.  . fluticasone (FLONASE) 50 MCG/ACT nasal spray Place 2 sprays into  both nostrils daily.  . folic acid (FOLVITE) 1 MG tablet Take 1 tablet (1 mg total) by mouth daily.  Marland Kitchen losartan (COZAAR) 25 MG tablet Take 1 tablet (25 mg total) by mouth daily.  . metoprolol succinate (TOPROL XL) 50 MG 24 hr tablet Take 1 tablet (50 mg total) by mouth daily. Take with or immediately following a meal.  . Multiple Vitamin (MULTIVITAMIN WITH MINERALS) TABS tablet Take 1 tablet by mouth daily.  . nitroGLYCERIN (NITROSTAT) 0.4 MG SL tablet Place 1 tablet (0.4 mg total) under the tongue every 5 (five) minutes as needed for chest pain.  . Nutritional Supplements (NUTRITIONAL SUPPLEMENT PO) HSG NAS Diet - Regular texture, Regular / Thin consistency  . ondansetron (ZOFRAN) 4 MG tablet Take 4 mg by mouth every 6 (six) hours as needed for nausea or vomiting.  . thiamine 100 MG tablet Take 100 mg by mouth daily.  . Tiotropium Bromide-Olodaterol (STIOLTO RESPIMAT) 2.5-2.5 MCG/ACT AERS Inhale 2 puffs into the lungs daily.  . traZODone (DESYREL) 50 MG tablet Take 50 mg by mouth at bedtime.   No facility-administered encounter medications on file as of 11/27/2017.      SIGNIFICANT DIAGNOSTIC EXAMS  PREVIOUS:   10-28-17: 2-d echo: - Left ventricle: The cavity size was normal. Systolic function was severely reduced. The estimated ejection fraction was in the range of 20% to 25%. There is akinesis of the mid-apicalanterior, anterolateral, lateral, inferolateral, inferior, and apical myocardium. The study is not technically sufficient to allow evaluation of LV diastolic function. - Mitral valve: There was mild regurgitation. - Right ventricle: Systolic function was mildly reduced. - Tricuspid valve: There was moderate regurgitation. - Pulmonary arteries: PA peak pressure: 71 mm Hg (S). Impressions: - The right ventricular systolic pressure was increased consistent with severe pulmonary hypertension.  10-28-17: left heart cath:  1. Severe 3 vessel obstructive CAD. Vessels are diffusely  diseased and severely calcified. 2. Severe LV dysfunction. Sparing of the base with otherwise diffuse akinesia. EF 15%.  3. Mildly elevated LVEDP  10-28-17: chest x-ray: 1. COPD and pleuroparenchymal scarring. Small left pleural effusion cannot be excluded. 2.  Cardiomegaly with mild pulmonary venous congestion. 3. Carotid and bilateral upper extremity peripheral vascular Disease.  10-29-17: chest x-ray: Increased bilateral lower lung opacity superimposed on severe emphysema. This is suspicious for superimposed pulmonary edema or infection. New tiny right pleural effusion.  NO NEW EXAMS   LABS REVIEWED: PREVIOUS :   10-28-17: wbc 21.9; hgb 12.1; hct  38.5; mcv 103.2; plt 258; glucose 146; bun 9; creat 1.10; k+ 4.8; na++ 136; ca 8.6; ast 177; albumin 2.9  10-30-17: wbc 9.8; hgb 10.2; hct 32.0; mcv 102.2; plt 165 glucose 76; bun 5; creat 0.64; k+ 3.2; na++ 139; ca 8.1 ast 270; alt 62; albumin 2.3  11-06-17: wbc 7.0' hgb 10.2; hct 31.7; mcv 101.9; plt 272; glucose 110; bun 7; creat 0.60 ;k+ 3.8; na++ 134 ca 8.7   NO NEW LABS  Review of Systems  Constitutional: Negative for malaise/fatigue.  Respiratory: Negative for cough and shortness of breath.   Cardiovascular: Negative for chest pain, palpitations and leg swelling.  Gastrointestinal: Negative for abdominal pain, constipation and heartburn.  Musculoskeletal: Negative for back pain, joint pain and myalgias.  Skin: Negative.   Neurological: Negative for dizziness.  Psychiatric/Behavioral: The patient is not nervous/anxious.      Physical Exam  Constitutional: No distress.  cachexia  Neck: No thyromegaly present.  Cardiovascular: Normal rate, regular rhythm and intact distal pulses.  Murmur heard. 1/6  Pulmonary/Chest: Effort normal and breath sounds normal. No respiratory distress.  Abdominal: Soft. Bowel sounds are normal. He exhibits no distension. There is no tenderness.  Musculoskeletal: Normal range of motion. He exhibits no  edema.  Muscle wasting present   Lymphadenopathy:    He has no cervical adenopathy.  Neurological: He is alert.  Skin: Skin is warm and dry. He is not diaphoretic.  Psychiatric: He has a normal mood and affect.      ASSESSMENT/ PLAN:  TODAY:   1. COPD with asthma: is stable will continue albuterol 2 puffs every 4 hours as needed; flonase daily and stiolto respimat 2.5-2.5 mcg 2 puffs daily   2. Dyslipidemia: is stable will continue lipitor 40 mg daily   3. Alcohol abuse: is presently stable will continue folic acid and thiamine supplements.  PREVIOUS   4. CAD due to calcified coronary lesion: has 3 vessel disease status post STEMI : not a surgical candidate: is stable no complaints of chest pain: will continue asa 81 mg daily has prn ntg  and plavix 75 mg daily is on statin and beta blocker  5.  Chronic systolic heart failure EF 15% (cardiac cath 10-28-17) is stable will continue toprol xl 50 mg daily   6.  Hypertensive heart disease with chronic systolic heart failure is stable; is stable b/p 100/68: will continue toprol xl 50 mg daily and cozaar 25 mg daily     MD is aware of resident's narcotic use and is in agreement with current plan of care. We will attempt to wean resident as apropriate   Synthia Innocent NP [facility] Adult Medicine  Contact [phone] Monday through Friday 8am- 5pm  After hours call [phone]

## 2017-12-08 ENCOUNTER — Ambulatory Visit: Payer: Medicaid Other | Admitting: Family

## 2017-12-09 ENCOUNTER — Encounter: Payer: Self-pay | Admitting: Adult Health

## 2017-12-09 ENCOUNTER — Non-Acute Institutional Stay (SKILLED_NURSING_FACILITY): Payer: Medicaid Other | Admitting: Adult Health

## 2017-12-09 DIAGNOSIS — I110 Hypertensive heart disease with heart failure: Secondary | ICD-10-CM

## 2017-12-09 DIAGNOSIS — I2510 Atherosclerotic heart disease of native coronary artery without angina pectoris: Secondary | ICD-10-CM

## 2017-12-09 DIAGNOSIS — I5022 Chronic systolic (congestive) heart failure: Secondary | ICD-10-CM

## 2017-12-09 NOTE — Progress Notes (Signed)
Location:   [facility] Room Number: 233 B Place of Service:  SNF (31)   CODE STATUS: Full Code  Allergies  Allergen Reactions  . Codeine Other (See Comments)    ams    Chief Complaint  Patient presents with  . Medical Management of Chronic Issues    Systolic heart failure; hypertension; cad    HPI:  He is a 62 year old long term resident of this facility who is being seen for the management of his chronic illnesses; systolic heart failure; hypertension; cad. He denies any chest pain; no shortness of breath; no heart burn. There are no nursing concerns at this time.   Past Medical History:  Diagnosis Date  . Asthma   . COPD (chronic obstructive pulmonary disease) (HCC)   . Hypertension     Past Surgical History:  Procedure Laterality Date  . CATARACT EXTRACTION, BILATERAL Bilateral   . ESOPHAGOGASTRODUODENOSCOPY (EGD) WITH PROPOFOL N/A 06/21/2015   Procedure: ESOPHAGOGASTRODUODENOSCOPY (EGD) WITH PROPOFOL;  Surgeon: Midge Minium, MD;  Location: [facility] ENDOSCOPY;  Service: Endoscopy;  Laterality: N/A;  . LEFT HEART CATH AND CORONARY ANGIOGRAPHY N/A 10/28/2017   Procedure: LEFT HEART CATH AND CORONARY ANGIOGRAPHY;  Surgeon: Swaziland, Peter M, MD;  Location: [facility] INVASIVE CV LAB;  Service: Cardiovascular;  Laterality: N/A;    Social History   Socioeconomic History  . Marital status: Legally Separated    Spouse name: Not on file  . Number of children: Not on file  . Years of education: 6  . Highest education level: 6th grade  Occupational History  . Occupation: retired  Engineer, production  . Financial resource strain: Very hard  . Food insecurity:    Worry: Often true    Inability: Often true  . Transportation needs:    Medical: No    Non-medical: No  Tobacco Use  . Smoking status: Former Smoker    Types: Cigarettes    Last attempt to quit: 06/21/2014    Years since quitting: 3.4  . Smokeless tobacco: Current User    Types: Chew  Substance and Sexual  Activity  . Alcohol use: Yes    Alcohol/week: 2.0 standard drinks    Types: 2 Cans of beer per week    Comment: per day  . Drug use: No  . Sexual activity: Never  Lifestyle  . Physical activity:    Days per week: Not on file    Minutes per session: Not on file  . Stress: Not on file  Relationships  . Social connections:    Talks on phone: Not on file    Gets together: Not on file    Attends religious service: Not on file    Active member of club or organization: Not on file    Attends meetings of clubs or organizations: Not on file    Relationship status: Not on file  . Intimate partner violence:    Fear of current or ex partner: Not on file    Emotionally abused: Not on file    Physically abused: Not on file    Forced sexual activity: Not on file  Other Topics Concern  . Not on file  Social History Narrative  . Not on file   Family History  Problem Relation Age of Onset  . Diabetes Unknown   . Hypertension Unknown       VITAL SIGNS BP 128/80   Pulse 77   Temp 98.1 F (36.7 C)   Resp 16   Ht  5\' 5"  (1.651 m)   Wt 94 lb 9.6 oz (42.9 kg)   SpO2 98%   BMI 15.74 kg/m   Outpatient Encounter Medications as of 12/09/2017  Medication Sig  . acetaminophen (TYLENOL) 325 MG tablet Take 2 tablets (650 mg total) by mouth every 4 (four) hours as needed for headache or mild pain.  Marland Kitchen albuterol (PROVENTIL HFA;VENTOLIN HFA) 108 (90 Base) MCG/ACT inhaler Inhale 2 puffs into the lungs every 4 (four) hours as needed for wheezing.   Marland Kitchen aspirin 81 MG chewable tablet Chew 1 tablet (81 mg total) by mouth daily.  Marland Kitchen atorvastatin (LIPITOR) 40 MG tablet Take 1 tablet (40 mg total) by mouth daily.  . clopidogrel (PLAVIX) 75 MG tablet Take 1 tablet (75 mg total) by mouth daily with breakfast.  . ENSURE PLUS (ENSURE PLUS) LIQD Take 237 mLs by mouth 3 (three) times daily.  . fluticasone (FLONASE) 50 MCG/ACT nasal spray Place 2 sprays into both nostrils daily.  . folic acid (FOLVITE) 1 MG  tablet Take 1 tablet (1 mg total) by mouth daily.  Marland Kitchen losartan (COZAAR) 25 MG tablet Take 1 tablet (25 mg total) by mouth daily.  . metoprolol succinate (TOPROL XL) 50 MG 24 hr tablet Take 1 tablet (50 mg total) by mouth daily. Take with or immediately following a meal.  . Multiple Vitamin (MULTIVITAMIN WITH MINERALS) TABS tablet Take 1 tablet by mouth daily.  . nitroGLYCERIN (NITROSTAT) 0.4 MG SL tablet Place 1 tablet (0.4 mg total) under the tongue every 5 (five) minutes as needed for chest pain.  . Nutritional Supplements (NUTRITIONAL SUPPLEMENT PO) Regular Diet - Mechanical Soft, Ground Meat texture  . ondansetron (ZOFRAN) 4 MG tablet Take 4 mg by mouth every 6 (six) hours as needed for nausea or vomiting.  . thiamine 100 MG tablet Take 100 mg by mouth daily.  . Tiotropium Bromide-Olodaterol (STIOLTO RESPIMAT) 2.5-2.5 MCG/ACT AERS Inhale 2 puffs into the lungs daily.  . traZODone (DESYREL) 50 MG tablet Take 50 mg by mouth at bedtime.   No facility-administered encounter medications on file as of 12/09/2017.      SIGNIFICANT DIAGNOSTIC EXAMS  PREVIOUS:   10-28-17: 2-d echo: - Left ventricle: The cavity size was normal. Systolic function was severely reduced. The estimated ejection fraction was in the range of 20% to 25%. There is akinesis of the mid-apicalanterior, anterolateral, lateral, inferolateral, inferior, and apical myocardium. The study is not technically sufficient to allow evaluation of LV diastolic function. - Mitral valve: There was mild regurgitation. - Right ventricle: Systolic function was mildly reduced. - Tricuspid valve: There was moderate regurgitation. - Pulmonary arteries: PA peak pressure: 71 mm Hg (S). Impressions: - The right ventricular systolic pressure was increased consistent with severe pulmonary hypertension.  10-28-17: left heart cath:  1. Severe 3 vessel obstructive CAD. Vessels are diffusely diseased and severely calcified. 2. Severe LV dysfunction.  Sparing of the base with otherwise diffuse akinesia. EF 15%.  3. Mildly elevated LVEDP  10-28-17: chest x-ray: 1. COPD and pleuroparenchymal scarring. Small left pleural effusion cannot be excluded. 2.  Cardiomegaly with mild pulmonary venous congestion. 3. Carotid and bilateral upper extremity peripheral vascular Disease.  10-29-17: chest x-ray: Increased bilateral lower lung opacity superimposed on severe emphysema. This is suspicious for superimposed pulmonary edema or infection. New tiny right pleural effusion.  NO NEW EXAMS   LABS REVIEWED: PREVIOUS :   10-28-17: wbc 21.9; hgb 12.1; hct 38.5; mcv 103.2; plt 258; glucose 146; bun 9; creat 1.10; k+ 4.8;  na++ 136; ca 8.6; ast 177; albumin 2.9  10-30-17: wbc 9.8; hgb 10.2; hct 32.0; mcv 102.2; plt 165 glucose 76; bun 5; creat 0.64; k+ 3.2; na++ 139; ca 8.1 ast 270; alt 62; albumin 2.3  11-06-17: wbc 7.0' hgb 10.2; hct 31.7; mcv 101.9; plt 272; glucose 110; bun 7; creat 0.60 ;k+ 3.8; na++ 134 ca 8.7   NO NEW LABS  Review of Systems  Constitutional: Negative for malaise/fatigue.  Respiratory: Negative for cough and shortness of breath.   Cardiovascular: Negative for chest pain, palpitations and leg swelling.  Gastrointestinal: Negative for abdominal pain, constipation and heartburn.  Musculoskeletal: Negative for back pain, joint pain and myalgias.  Skin: Negative.   Neurological: Negative for dizziness.  Psychiatric/Behavioral: The patient is not nervous/anxious.    Physical Exam  Constitutional: No distress.  cachexia  Neck: No thyromegaly present.  Cardiovascular: Normal rate, regular rhythm and intact distal pulses.  Murmur heard. 1/6  Pulmonary/Chest: Effort normal and breath sounds normal. No respiratory distress.  Abdominal: Soft. Bowel sounds are normal. He exhibits no distension. There is no tenderness.  Musculoskeletal: Normal range of motion. He exhibits no edema.  Has muscle wasting   Lymphadenopathy:    He has no  cervical adenopathy.  Neurological: He is alert.  Skin: Skin is warm and dry. He is not diaphoretic.  Psychiatric: He has a normal mood and affect.     ASSESSMENT/ PLAN:  TODAY:   1. 3-vessel coronary artery disease:  status post STEMI : not a surgical candidate: is stable no complaints of chest pain: will continue asa 81 mg daily has prn ntg  and plavix 75 mg daily is on statin and beta blocker  2.  Chronic systolic heart failure EF 15% (cardiac cath 10-28-17) is stable will continue toprol xl 50 mg daily   3.  Hypertensive heart disease with chronic systolic heart failure is stable; is stable b/p 128/80: will continue toprol xl 50 mg daily and cozaar 25 mg daily   PREVIOUS  4. Insomnia: is stable will continue trazodone 50 mg nightly   5. Chewing tobacco use: continues to chew; does not want to stop.    6. COPD with asthma: is stable will continue albuterol 2 puffs every 4 hours as needed; flonase daily and stiolto respimat 2.5-2.5 mcg 2 puffs daily   7. Dyslipidemia: is stable will continue lipitor 40 mg daily   8. Alcohol abuse: is presently stable will continue folic acid and thiamine supplements.  MD is aware of resident's narcotic use and is in agreement with current plan of care. We will attempt to wean resident as apropriate   Synthia Innocent NP [facility] Adult Medicine  Contact [phone] Monday through Friday 8am- 5pm  After hours call [phone]

## 2017-12-15 DIAGNOSIS — G4700 Insomnia, unspecified: Secondary | ICD-10-CM | POA: Insufficient documentation

## 2017-12-15 DIAGNOSIS — Z720 Tobacco use: Secondary | ICD-10-CM | POA: Insufficient documentation

## 2018-01-06 ENCOUNTER — Encounter: Payer: Self-pay | Admitting: Adult Health

## 2018-01-06 ENCOUNTER — Non-Acute Institutional Stay (SKILLED_NURSING_FACILITY): Payer: Medicaid Other | Admitting: Adult Health

## 2018-01-06 DIAGNOSIS — Z720 Tobacco use: Secondary | ICD-10-CM | POA: Diagnosis not present

## 2018-01-06 DIAGNOSIS — E785 Hyperlipidemia, unspecified: Secondary | ICD-10-CM

## 2018-01-06 DIAGNOSIS — F5101 Primary insomnia: Secondary | ICD-10-CM | POA: Diagnosis not present

## 2018-01-06 DIAGNOSIS — J449 Chronic obstructive pulmonary disease, unspecified: Secondary | ICD-10-CM | POA: Diagnosis not present

## 2018-01-06 NOTE — Progress Notes (Signed)
Location:   [facility] Room Number: 233 B Place of Service:  SNF (31)   CODE STATUS: Full Code  Allergies  Allergen Reactions  . Codeine Other (See Comments)    ams    Chief Complaint  Patient presents with  . Medical Management of Chronic Issues    Copd; tobacco abuse; dyslipidemia; insomnia.     HPI:  He is a long term resident resident of this facility being seen for the management of his chronic illnesses: copd; tobacco abuse; dyslipidemia; insomnia. He denies any cough or shortness of breath. He denies any uncontrolled pain. He does continue to chew tobacco. There are no nursing concerns at this time.   Past Medical History:  Diagnosis Date  . Asthma   . COPD (chronic obstructive pulmonary disease) (HCC)   . Hypertension     Past Surgical History:  Procedure Laterality Date  . CATARACT EXTRACTION, BILATERAL Bilateral   . ESOPHAGOGASTRODUODENOSCOPY (EGD) WITH PROPOFOL N/A 06/21/2015   Procedure: ESOPHAGOGASTRODUODENOSCOPY (EGD) WITH PROPOFOL;  Surgeon: Midge Minium, MD;  Location: [facility] ENDOSCOPY;  Service: Endoscopy;  Laterality: N/A;  . LEFT HEART CATH AND CORONARY ANGIOGRAPHY N/A 10/28/2017   Procedure: LEFT HEART CATH AND CORONARY ANGIOGRAPHY;  Surgeon: Swazil[place], Peter M, MD;  Location: [facility] INVASIVE CV LAB;  Service: Cardiovascular;  Laterality: N/A;    Social History   Socioeconomic History  . Marital status: Legally Separated    Spouse name: Not on file  . Number of children: Not on file  . Years of education: 6  . Highest education level: 6th grade  Occupational History  . Occupation: retired  Engineer, production  . Financial resource strain: Very hard  . Food insecurity:    Worry: Often true    Inability: Often true  . Transportation needs:    Medical: No    Non-medical: No  Tobacco Use  . Smoking status: Former Smoker    Types: Cigarettes    Last attempt to quit: 06/21/2014    Years since quitting: 3.5  . Smokeless tobacco: Current User   Types: Chew  Substance and Sexual Activity  . Alcohol use: Yes    Alcohol/week: 2.0 standard drinks    Types: 2 Cans of beer per week    Comment: per day  . Drug use: No  . Sexual activity: Never  Lifestyle  . Physical activity:    Days per week: Not on file    Minutes per session: Not on file  . Stress: Not on file  Relationships  . Social connections:    Talks on phone: Not on file    Gets together: Not on file    Attends religious service: Not on file    Active member of club or organization: Not on file    Attends meetings of clubs or organizations: Not on file    Relationship status: Not on file  . Intimate partner violence:    Fear of current or ex partner: Not on file    Emotionally abused: Not on file    Physically abused: Not on file    Forced sexual activity: Not on file  Other Topics Concern  . Not on file  Social History Narrative  . Not on file   Family History  Problem Relation Age of Onset  . Diabetes Unknown   . Hypertension Unknown       VITAL SIGNS BP 120/68   Pulse 68   Temp 98.1 F (36.7 C)   Ht 5\' 5"  (  1.651 m)   Wt 105 lb 12.8 oz (48 kg)   BMI 17.61 kg/m   Outpatient Encounter Medications as of 01/06/2018  Medication Sig  . acetaminophen (TYLENOL) 325 MG tablet Take 2 tablets (650 mg total) by mouth every 4 (four) hours as needed for headache or mild pain.  Marland Kitchen albuterol (PROVENTIL HFA;VENTOLIN HFA) 108 (90 Base) MCG/ACT inhaler Inhale 2 puffs into the lungs every 4 (four) hours as needed for wheezing.   Marland Kitchen aspirin 81 MG chewable tablet Chew 1 tablet (81 mg total) by mouth daily.  Marland Kitchen atorvastatin (LIPITOR) 40 MG tablet Take 1 tablet (40 mg total) by mouth daily.  . clopidogrel (PLAVIX) 75 MG tablet Take 1 tablet (75 mg total) by mouth daily with breakfast.  . ENSURE PLUS (ENSURE PLUS) LIQD Take 237 mLs by mouth 3 (three) times daily.  . fluticasone (FLONASE) 50 MCG/ACT nasal spray Place 2 sprays into both nostrils daily.  . folic acid  (FOLVITE) 1 MG tablet Take 1 tablet (1 mg total) by mouth daily.  Marland Kitchen losartan (COZAAR) 25 MG tablet Take 1 tablet (25 mg total) by mouth daily.  . metoprolol succinate (TOPROL XL) 50 MG 24 hr tablet Take 1 tablet (50 mg total) by mouth daily. Take with or immediately following a meal.  . Multiple Vitamin (MULTIVITAMIN WITH MINERALS) TABS tablet Take 1 tablet by mouth daily.  . nitroGLYCERIN (NITROSTAT) 0.4 MG SL tablet Place 1 tablet (0.4 mg total) under the tongue every 5 (five) minutes as needed for chest pain.  . Nutritional Supplements (NUTRITIONAL SUPPLEMENT PO) Regular Diet - Regular / Thin consistency  . ondansetron (ZOFRAN) 4 MG tablet Take 4 mg by mouth every 6 (six) hours as needed for nausea or vomiting.  . thiamine 100 MG tablet Take 100 mg by mouth daily.  . Tiotropium Bromide-Olodaterol (STIOLTO RESPIMAT) 2.5-2.5 MCG/ACT AERS Inhale 2 puffs into the lungs daily.  . traZODone (DESYREL) 50 MG tablet Take 50 mg by mouth at bedtime.   No facility-administered encounter medications on file as of 01/06/2018.      SIGNIFICANT DIAGNOSTIC EXAMS  PREVIOUS:   10-28-17: 2-d echo: - Left ventricle: The cavity size was normal. Systolic function was severely reduced. The estimated ejection fraction was in the range of 20% to 25%. There is akinesis of the mid-apicalanterior, anterolateral, lateral, inferolateral, inferior, and apical myocardium. The study is not technically sufficient to allow evaluation of LV diastolic function. - Mitral valve: There was mild regurgitation. - Right ventricle: Systolic function was mildly reduced. - Tricuspid valve: There was moderate regurgitation. - Pulmonary arteries: PA peak pressure: 71 mm Hg (S). Impressions: - The right ventricular systolic pressure was increased consistent with severe pulmonary hypertension.  10-28-17: left heart cath:  1. Severe 3 vessel obstructive CAD. Vessels are diffusely diseased and severely calcified. 2. Severe LV dysfunction.  Sparing of the base with otherwise diffuse akinesia. EF 15%.  3. Mildly elevated LVEDP  10-28-17: chest x-ray: 1. COPD and pleuroparenchymal scarring. Small left pleural effusion cannot be excluded. 2.  Cardiomegaly with mild pulmonary venous congestion. 3. Carotid and bilateral upper extremity peripheral vascular Disease.  10-29-17: chest x-ray: Increased bilateral lower lung opacity superimposed on severe emphysema. This is suspicious for superimposed pulmonary edema or infection. New tiny right pleural effusion.  NO NEW EXAMS   LABS REVIEWED: PREVIOUS :   10-28-17: wbc 21.9; hgb 12.1; hct 38.5; mcv 103.2; plt 258; glucose 146; bun 9; creat 1.10; k+ 4.8; na++ 136; ca 8.6; ast 177; albumin  2.9  10-30-17: wbc 9.8; hgb 10.2; hct 32.0; mcv 102.2; plt 165 glucose 76; bun 5; creat 0.64; k+ 3.2; na++ 139; ca 8.1 ast 270; alt 62; albumin 2.3  11-06-17: wbc 7.0' hgb 10.2; hct 31.7; mcv 101.9; plt 272; glucose 110; bun 7; creat 0.60 ;k+ 3.8; na++ 134 ca 8.7   NO NEW LABS   Review of Systems  Constitutional: Negative for malaise/fatigue.  Respiratory: Negative for cough and shortness of breath.   Cardiovascular: Negative for chest pain, palpitations and leg swelling.  Gastrointestinal: Negative for abdominal pain, constipation and heartburn.  Musculoskeletal: Negative for back pain, joint pain and myalgias.  Skin: Negative.   Neurological: Negative for dizziness.  Psychiatric/Behavioral: The patient is not nervous/anxious.    Physical Exam  Constitutional: No distress.  Cachexia   Neck: No thyromegaly present.  Cardiovascular: Normal rate, regular rhythm and intact distal pulses.  Murmur heard. 1/6  Pulmonary/Chest: Effort normal and breath sounds normal. No respiratory distress.  Abdominal: Soft. Bowel sounds are normal. He exhibits no distension. There is no tenderness.  Musculoskeletal: Normal range of motion. He exhibits no edema.  Muscle wasting present   Lymphadenopathy:    He has  no cervical adenopathy.  Neurological: He is alert.  Skin: Skin is warm and dry. He is not diaphoretic.  Psychiatric: He has a normal mood and affect.     ASSESSMENT/ PLAN:  TODAY:   1. Insomnia: is stable will continue trazodone 50 mg nightly   2. Chewing tobacco use: continues to chew; does not want to stop.    3. COPD with asthma: is stable will continue albuterol 2 puffs every 4 hours as needed; flonase daily and stiolto respimat 2.5-2.5 mcg 2 puffs daily   4. Dyslipidemia: is stable will continue lipitor 40 mg daily   PREVIOUS  5. Alcohol abuse: is presently stable will continue folic acid and thiamine supplements.  6. 3-vessel coronary artery disease:  status post STEMI : not a surgical candidate: is stable no complaints of chest pain: will continue asa 81 mg daily has prn ntg  and plavix 75 mg daily is on statin and beta blocker  7.  Chronic systolic heart failure EF 15% (cardiac cath 10-28-17) is stable will continue toprol xl 50 mg daily   8.  Hypertensive heart disease with chronic systolic heart failure is stable; is stable b/p 116/78: will continue toprol xl 50 mg daily and cozaar 25 mg daily    MD is aware of resident's narcotic use and is in agreement with current plan of care. We will attempt to wean resident as apropriate   Synthia Innocent NP [facility] Adult Medicine  Contact [phone] Monday through Friday 8am- 5pm  After hours call [phone]

## 2018-01-20 ENCOUNTER — Encounter: Payer: Self-pay | Admitting: Adult Health

## 2018-01-20 NOTE — Progress Notes (Signed)
Entered in error

## 2018-01-21 ENCOUNTER — Encounter: Payer: Self-pay | Admitting: Adult Health

## 2018-01-21 ENCOUNTER — Non-Acute Institutional Stay (SKILLED_NURSING_FACILITY): Payer: Self-pay | Admitting: Adult Health

## 2018-01-21 DIAGNOSIS — I2510 Atherosclerotic heart disease of native coronary artery without angina pectoris: Secondary | ICD-10-CM

## 2018-01-21 DIAGNOSIS — I5022 Chronic systolic (congestive) heart failure: Secondary | ICD-10-CM

## 2018-01-21 DIAGNOSIS — I2111 ST elevation (STEMI) myocardial infarction involving right coronary artery: Secondary | ICD-10-CM

## 2018-01-21 NOTE — Progress Notes (Signed)
Location:   [facility] Room Number: 233 B Place of Service:  SNF (31)    CODE STATUS: Full Code  Allergies  Allergen Reactions  . Codeine Other (See Comments)    ams    Chief Complaint  Patient presents with  . Discharge Note    Discharging to home on 01/23/18    HPI:  He is being discharged to home with him health for to/rn. He will not need any dme. He will need his prescriptions written and need to follow up with him medical provider. He had been hospitalized for severe 3 vessel disease status post MI and an ef of 15%. He is ready to return back home.    Past Medical History:  Diagnosis Date  . Asthma   . COPD (chronic obstructive pulmonary disease) (HCC)   . Hypertension     Past Surgical History:  Procedure Laterality Date  . CATARACT EXTRACTION, BILATERAL Bilateral   . ESOPHAGOGASTRODUODENOSCOPY (EGD) WITH PROPOFOL N/A 06/21/2015   Procedure: ESOPHAGOGASTRODUODENOSCOPY (EGD) WITH PROPOFOL;  Surgeon: Midge Minium, MD;  Location: [facility] ENDOSCOPY;  Service: Endoscopy;  Laterality: N/A;  . LEFT HEART CATH AND CORONARY ANGIOGRAPHY N/A 10/28/2017   Procedure: LEFT HEART CATH AND CORONARY ANGIOGRAPHY;  Surgeon: Swaziland, Peter M, MD;  Location: [facility] INVASIVE CV LAB;  Service: Cardiovascular;  Laterality: N/A;    Social History   Socioeconomic History  . Marital status: Legally Separated    Spouse name: Not on file  . Number of children: Not on file  . Years of education: 6  . Highest education level: 6th grade  Occupational History  . Occupation: retired  Engineer, production  . Financial resource strain: Very hard  . Food insecurity:    Worry: Often true    Inability: Often true  . Transportation needs:    Medical: No    Non-medical: No  Tobacco Use  . Smoking status: Former Smoker    Types: Cigarettes    Last attempt to quit: 06/21/2014    Years since quitting: 3.6  . Smokeless tobacco: Current User    Types: Chew  Substance and Sexual Activity  .  Alcohol use: Yes    Alcohol/week: 2.0 standard drinks    Types: 2 Cans of beer per week    Comment: per day  . Drug use: No  . Sexual activity: Never  Lifestyle  . Physical activity:    Days per week: Not on file    Minutes per session: Not on file  . Stress: Not on file  Relationships  . Social connections:    Talks on phone: Not on file    Gets together: Not on file    Attends religious service: Not on file    Active member of club or organization: Not on file    Attends meetings of clubs or organizations: Not on file    Relationship status: Not on file  . Intimate partner violence:    Fear of current or ex partner: Not on file    Emotionally abused: Not on file    Physically abused: Not on file    Forced sexual activity: Not on file  Other Topics Concern  . Not on file  Social History Narrative  . Not on file   Family History  Problem Relation Age of Onset  . Diabetes Unknown   . Hypertension Unknown     VITAL SIGNS BP 118/76   Pulse 95   Temp 97.8 F (36.6 C)  Resp 18   Ht 5\' 5"  (1.651 m)   Wt 111 lb 9.6 oz (50.6 kg)   SpO2 96%   BMI 18.57 kg/m   Patient's Medications  New Prescriptions   No medications on file  Previous Medications   ACETAMINOPHEN (TYLENOL) 325 MG TABLET    Take 2 tablets (650 mg total) by mouth every 4 (four) hours as needed for headache or mild pain.   ALBUTEROL (PROVENTIL HFA;VENTOLIN HFA) 108 (90 BASE) MCG/ACT INHALER    Inhale 2 puffs into the lungs every 4 (four) hours as needed for wheezing.    ASPIRIN 81 MG CHEWABLE TABLET    Chew 1 tablet (81 mg total) by mouth daily.   ATORVASTATIN (LIPITOR) 40 MG TABLET    Take 1 tablet (40 mg total) by mouth daily.   CLOPIDOGREL (PLAVIX) 75 MG TABLET    Take 1 tablet (75 mg total) by mouth daily with breakfast.   ENSURE PLUS (ENSURE PLUS) LIQD    Take 237 mLs by mouth 3 (three) times daily.   FLUTICASONE (FLONASE) 50 MCG/ACT NASAL SPRAY    Place 2 sprays into both nostrils daily.   FOLIC  ACID (FOLVITE) 1 MG TABLET    Take 1 tablet (1 mg total) by mouth daily.   LOSARTAN (COZAAR) 25 MG TABLET    Take 1 tablet (25 mg total) by mouth daily.   METOPROLOL SUCCINATE (TOPROL XL) 50 MG 24 HR TABLET    Take 1 tablet (50 mg total) by mouth daily. Take with or immediately following a meal.   MULTIPLE VITAMIN (MULTIVITAMIN WITH MINERALS) TABS TABLET    Take 1 tablet by mouth daily.   NITROGLYCERIN (NITROSTAT) 0.4 MG SL TABLET    Place 1 tablet (0.4 mg total) under the tongue every 5 (five) minutes as needed for chest pain.   NUTRITIONAL SUPPLEMENTS (NUTRITIONAL SUPPLEMENT PO)    Regular Diet - Regular / Thin consistency   ONDANSETRON (ZOFRAN) 4 MG TABLET    Take 4 mg by mouth every 6 (six) hours as needed for nausea or vomiting.   THIAMINE 100 MG TABLET    Take 100 mg by mouth daily.   TIOTROPIUM BROMIDE-OLODATEROL (STIOLTO RESPIMAT) 2.5-2.5 MCG/ACT AERS    Inhale 2 puffs into the lungs daily.   TRAZODONE (DESYREL) 50 MG TABLET    Take 50 mg by mouth at bedtime.  Modified Medications   No medications on file  Discontinued Medications   No medications on file     SIGNIFICANT DIAGNOSTIC EXAMS  PREVIOUS:   10-28-17: 2-d echo: - Left ventricle: The cavity size was normal. Systolic function was severely reduced. The estimated ejection fraction was in the range of 20% to 25%. There is akinesis of the mid-apicalanterior, anterolateral, lateral, inferolateral, inferior, and apical myocardium. The study is not technically sufficient to allow evaluation of LV diastolic function. - Mitral valve: There was mild regurgitation. - Right ventricle: Systolic function was mildly reduced. - Tricuspid valve: There was moderate regurgitation. - Pulmonary arteries: PA peak pressure: 71 mm Hg (S). Impressions: - The right ventricular systolic pressure was increased consistent with severe pulmonary hypertension.  10-28-17: left heart cath:  1. Severe 3 vessel obstructive CAD. Vessels are diffusely diseased  and severely calcified. 2. Severe LV dysfunction. Sparing of the base with otherwise diffuse akinesia. EF 15%.  3. Mildly elevated LVEDP  10-28-17: chest x-ray: 1. COPD and pleuroparenchymal scarring. Small left pleural effusion cannot be excluded. 2.  Cardiomegaly with mild pulmonary venous congestion. 3.  Carotid and bilateral upper extremity peripheral vascular Disease.  10-29-17: chest x-ray: Increased bilateral lower lung opacity superimposed on severe emphysema. This is suspicious for superimposed pulmonary edema or infection. New tiny right pleural effusion.  NO NEW EXAMS   LABS REVIEWED: PREVIOUS :   10-28-17: wbc 21.9; hgb 12.1; hct 38.5; mcv 103.2; plt 258; glucose 146; bun 9; creat 1.10; k+ 4.8; na++ 136; ca 8.6; ast 177; albumin 2.9  10-30-17: wbc 9.8; hgb 10.2; hct 32.0; mcv 102.2; plt 165 glucose 76; bun 5; creat 0.64; k+ 3.2; na++ 139; ca 8.1 ast 270; alt 62; albumin 2.3  11-06-17: wbc 7.0' hgb 10.2; hct 31.7; mcv 101.9; plt 272; glucose 110; bun 7; creat 0.60 ;k+ 3.8; na++ 134 ca 8.7   NO NEW LABS  Review of Systems  Constitutional: Negative for malaise/fatigue.  Respiratory: Negative for cough and shortness of breath.   Cardiovascular: Negative for chest pain, palpitations and leg swelling.  Gastrointestinal: Negative for abdominal pain, constipation and heartburn.  Musculoskeletal: Negative for back pain, joint pain and myalgias.  Skin: Negative.   Neurological: Negative for dizziness.  Psychiatric/Behavioral: The patient is not nervous/anxious.    Physical Exam  Constitutional: No distress.  Cachexia   Neck: No thyromegaly present.  Cardiovascular: Normal rate, regular rhythm and intact distal pulses.  Murmur heard. 1/6  Pulmonary/Chest: Effort normal and breath sounds normal. No respiratory distress.  Abdominal: Soft. Bowel sounds are normal. He exhibits no distension. There is no tenderness.  Musculoskeletal: Normal range of motion. He exhibits no edema.  Has  muscle wasting   Lymphadenopathy:    He has no cervical adenopathy.  Neurological: He is alert.  Skin: Skin is warm and dry. He is not diaphoretic.  Psychiatric: He has a normal mood and affect.     ASSESSMENT/ PLAN:   Patient is being discharged with the following home health services:  To/rn: to evaluate and treat as indicated for adl training and medication management.   Patient is being discharged with the following durable medical equipment:  None required   Patient has been advised to f/u with their PCP in 1-2 weeks to bring them up to date on their rehab stay.  Social services at facility was responsible for arranging this appointment.  Pt was provided with a 30 day supply of prescriptions for medications and refills must be obtained from their PCP.  For controlled substances, a more limited supply may be provided adequate until PCP appointment only.   A 30 day supply of his prescription medications have been written as listed above  Time spent with patient: 35 minutes: discussed medications; home health needs and expectations. Verbalized understanding.    Synthia Innocent NP [facility] Adult Medicine  Contact [phone] Monday through Friday 8am- 5pm  After hours call [phone]

## 2018-01-27 ENCOUNTER — Encounter: Payer: Self-pay | Admitting: Adult Health

## 2018-04-07 ENCOUNTER — Other Ambulatory Visit: Payer: Self-pay

## 2018-04-07 ENCOUNTER — Emergency Department (HOSPITAL_COMMUNITY): Payer: Medicaid Other

## 2018-04-07 ENCOUNTER — Emergency Department (HOSPITAL_COMMUNITY)
Admission: EM | Admit: 2018-04-07 | Discharge: 2018-04-08 | Disposition: A | Discharge status: Home / Self Care | Payer: Medicaid Other | Attending: Emergency Medicine | Admitting: Emergency Medicine

## 2018-04-07 ENCOUNTER — Encounter (HOSPITAL_COMMUNITY): Payer: Self-pay

## 2018-04-07 DIAGNOSIS — I252 Old myocardial infarction: Secondary | ICD-10-CM | POA: Diagnosis not present

## 2018-04-07 DIAGNOSIS — J449 Chronic obstructive pulmonary disease, unspecified: Secondary | ICD-10-CM | POA: Diagnosis not present

## 2018-04-07 DIAGNOSIS — I110 Hypertensive heart disease with heart failure: Secondary | ICD-10-CM | POA: Diagnosis not present

## 2018-04-07 DIAGNOSIS — E785 Hyperlipidemia, unspecified: Secondary | ICD-10-CM | POA: Insufficient documentation

## 2018-04-07 DIAGNOSIS — R55 Syncope and collapse: Secondary | ICD-10-CM

## 2018-04-07 DIAGNOSIS — I951 Orthostatic hypotension: Secondary | ICD-10-CM | POA: Diagnosis not present

## 2018-04-07 DIAGNOSIS — S0101XA Laceration without foreign body of scalp, initial encounter: Secondary | ICD-10-CM

## 2018-04-07 DIAGNOSIS — Z87891 Personal history of nicotine dependence: Secondary | ICD-10-CM | POA: Insufficient documentation

## 2018-04-07 DIAGNOSIS — W010XXA Fall on same level from slipping, tripping and stumbling without subsequent striking against object, initial encounter: Secondary | ICD-10-CM | POA: Insufficient documentation

## 2018-04-07 DIAGNOSIS — Y939 Activity, unspecified: Secondary | ICD-10-CM | POA: Diagnosis not present

## 2018-04-07 DIAGNOSIS — F0390 Unspecified dementia without behavioral disturbance: Secondary | ICD-10-CM | POA: Insufficient documentation

## 2018-04-07 DIAGNOSIS — Y998 Other external cause status: Secondary | ICD-10-CM | POA: Insufficient documentation

## 2018-04-07 DIAGNOSIS — E860 Dehydration: Secondary | ICD-10-CM

## 2018-04-07 DIAGNOSIS — Z7982 Long term (current) use of aspirin: Secondary | ICD-10-CM | POA: Insufficient documentation

## 2018-04-07 DIAGNOSIS — I5022 Chronic systolic (congestive) heart failure: Secondary | ICD-10-CM | POA: Diagnosis not present

## 2018-04-07 DIAGNOSIS — Y92009 Unspecified place in unspecified non-institutional (private) residence as the place of occurrence of the external cause: Secondary | ICD-10-CM | POA: Insufficient documentation

## 2018-04-07 DIAGNOSIS — Z7902 Long term (current) use of antithrombotics/antiplatelets: Secondary | ICD-10-CM | POA: Diagnosis not present

## 2018-04-07 DIAGNOSIS — Z79899 Other long term (current) drug therapy: Secondary | ICD-10-CM | POA: Diagnosis not present

## 2018-04-07 HISTORY — DX: Non-ST elevation (NSTEMI) myocardial infarction: I21.4

## 2018-04-07 LAB — CBC WITH DIFFERENTIAL/PLATELET
Abs Immature Granulocytes: 0.05 10*3/uL (ref 0.00–0.07)
Basophils Absolute: 0 10*3/uL (ref 0.0–0.1)
Basophils Relative: 0 %
Eosinophils Absolute: 0 10*3/uL (ref 0.0–0.5)
Eosinophils Relative: 0 %
HCT: 30.4 % — ABNORMAL LOW (ref 39.0–52.0)
Hemoglobin: 9.2 g/dL — ABNORMAL LOW (ref 13.0–17.0)
Immature Granulocytes: 1 %
Lymphocytes Relative: 14 %
Lymphs Abs: 1 10*3/uL (ref 0.7–4.0)
MCH: 26.4 pg (ref 26.0–34.0)
MCHC: 30.3 g/dL (ref 30.0–36.0)
MCV: 87.1 fL (ref 80.0–100.0)
Monocytes Absolute: 0.3 10*3/uL (ref 0.1–1.0)
Monocytes Relative: 4 %
Neutro Abs: 5.4 10*3/uL (ref 1.7–7.7)
Neutrophils Relative %: 81 %
Platelets: 471 10*3/uL — ABNORMAL HIGH (ref 150–400)
RBC: 3.49 MIL/uL — ABNORMAL LOW (ref 4.22–5.81)
RDW: 18 % — ABNORMAL HIGH (ref 11.5–15.5)
WBC: 6.7 10*3/uL (ref 4.0–10.5)
nRBC: 0 % (ref 0.0–0.2)

## 2018-04-07 LAB — COMPREHENSIVE METABOLIC PANEL
ALT: 22 U/L (ref 0–44)
AST: 50 U/L — ABNORMAL HIGH (ref 15–41)
Albumin: 3.1 g/dL — ABNORMAL LOW (ref 3.5–5.0)
Alkaline Phosphatase: 69 U/L (ref 38–126)
Anion gap: 13 (ref 5–15)
BUN: 6 mg/dL — ABNORMAL LOW (ref 8–23)
CO2: 19 mmol/L — ABNORMAL LOW (ref 22–32)
Calcium: 8.1 mg/dL — ABNORMAL LOW (ref 8.9–10.3)
Chloride: 101 mmol/L (ref 98–111)
Creatinine, Ser: 0.94 mg/dL (ref 0.61–1.24)
GFR calc Af Amer: >60 mL/min (ref >60)
GFR calc non Af Amer: >60 mL/min (ref >60)
Glucose, Bld: 97 mg/dL (ref 70–99)
Potassium: 3.7 mmol/L (ref 3.5–5.1)
Sodium: 133 mmol/L — ABNORMAL LOW (ref 135–145)
Total Bilirubin: 0.4 mg/dL (ref 0.3–1.2)
Total Protein: 6.4 g/dL — ABNORMAL LOW (ref 6.5–8.1)

## 2018-04-07 LAB — I-STAT TROPONIN, ED: Troponin i, poc: 0.01 ng/mL (ref 0.00–0.08)

## 2018-04-07 LAB — LIPASE, BLOOD: Lipase: 48 U/L (ref 11–51)

## 2018-04-07 MED ORDER — SODIUM CHLORIDE 0.9 % IV BOLUS
250.0000 mL | Freq: Once | INTRAVENOUS | Status: AC
  Administered 2018-04-07: 250 mL via INTRAVENOUS

## 2018-04-07 MED ORDER — SODIUM CHLORIDE 0.9 % IV BOLUS
500.0000 mL | Freq: Once | INTRAVENOUS | Status: AC
  Administered 2018-04-07: 500 mL via INTRAVENOUS
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
## 2018-04-07 NOTE — ED Notes (Signed)
Patient transported to X-ray 

## 2018-04-07 NOTE — Discharge Instructions (Signed)
Your work-up today was suggestive of dehydration.  Your blood pressure probably dropped when you stood up which caused you to pass out.  Make sure you drink plenty water and get plenty of rest.  Continue to take your home medicines as prescribed.  The wound to the back of your head was repaired with skin glue.  This will fall off in approximately 5 days on its own.  Do not apply any antibiotic ointment or lotions to this area as this may break down the glue.  Follow-up with your primary care physician for reevaluation of your symptoms.  Return to the emergency department if any concerning signs or symptoms develop such as passing out, high fevers, persistent vomiting, or severe pain.

## 2018-04-07 NOTE — ED Triage Notes (Signed)
Pt brought in by [facility] from home for syncopal episode today. Pt states he hit the back of his head, small laceration noted to posterior of pt head, bleeding controlled. Pt endorses generalized weakness x1 month, endorses diarrhea x3 days, abdominal cramping x2 days. Per EMS pt orthostatic, sitting 90 systolic, standing 70 systolic. Pt A+Ox4.

## 2018-04-08 MED ORDER — ACETAMINOPHEN 325 MG PO TABS
650.0000 mg | ORAL_TABLET | ORAL | Status: DC | PRN
  Administered 2018-04-08: 650 mg via ORAL
  Filled 2018-04-08: qty 2

## 2018-04-08 NOTE — ED Notes (Signed)
Pt complaining of leg pain, provider made aware.
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
## 2018-04-08 NOTE — ED Notes (Signed)
Pt arrived on unit. Back of head and upper back wet with serosanguinous drainage. Sheet soiled with drainage. Linen and gown changed. Bleeding controlled, will continue to monitor. Pt given snacks to eat. Pt comfortable, denies pain.

## 2018-04-08 NOTE — Discharge Planning (Signed)
EDCM provided taxi voucher for transportation needs.

## 2018-05-21 ENCOUNTER — Other Ambulatory Visit: Payer: Self-pay | Admitting: Adult Health

## 2018-06-14 IMAGING — CT CT CERVICAL SPINE W/O CM
3 of 7 series · 11 of 33 positions shown, 13 images · non-contrast
Comparison: Head CTs dated 01/30/2017, 10/10/2016 and 06/26/2005.
Cervical spine CT dated 10/10/2016.

CLINICAL DATA: Status post fall.

EXAM:
CT HEAD WITHOUT CONTRAST
CT CERVICAL SPINE WITHOUT CONTRAST
TECHNIQUE: Multidetector CT imaging of the head and cervical spine was
performed following the standard protocol without intravenous
contrast. Multiplanar CT image reconstructions of the cervical spine
were also generated.

[Series 10: sagittal bone · sagittal · 0.23mm/px · 5 of 50 slices shown]
[im 9/50  bone]
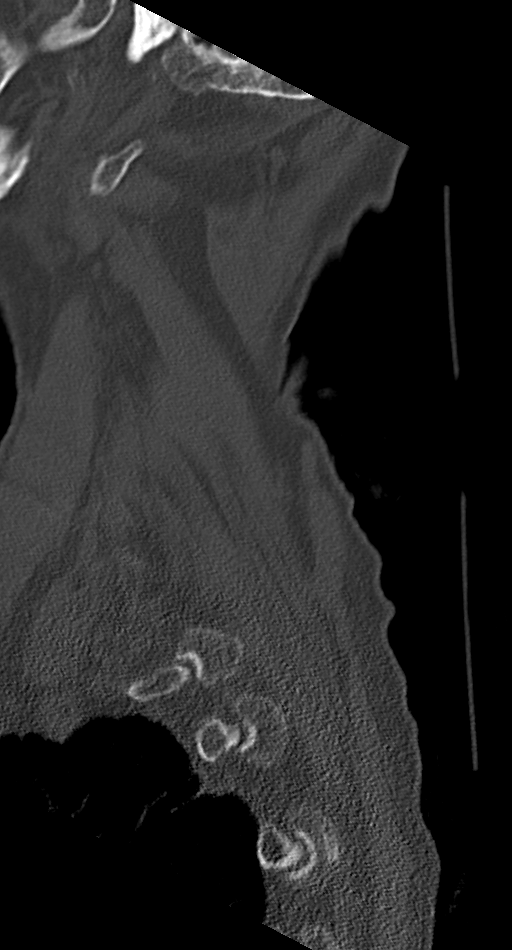
[im 17/50  bone]
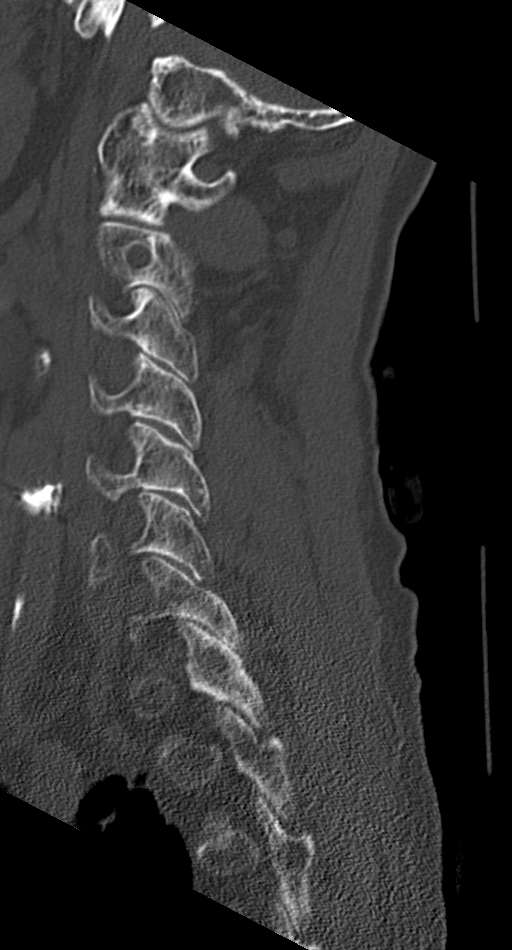
[im 25/50  bone]
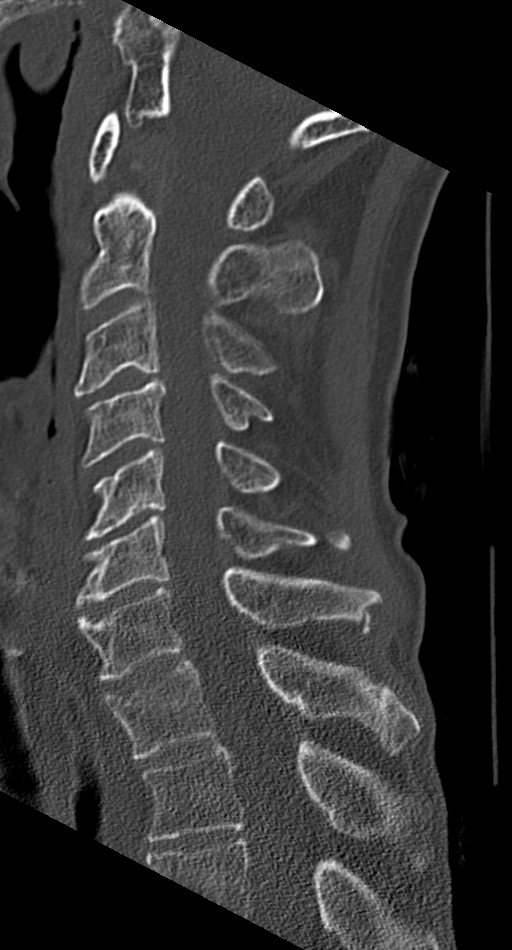
[im 33/50  bone]
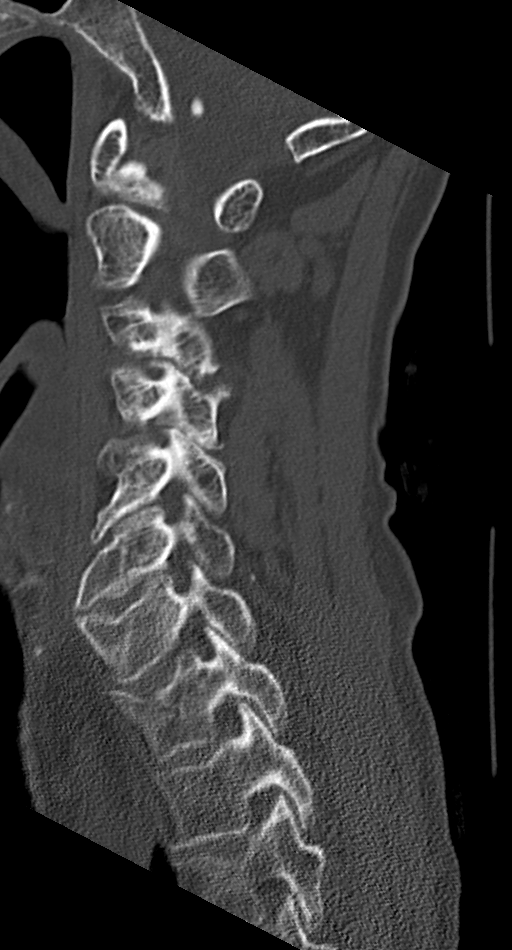
[im 41/50  bone]
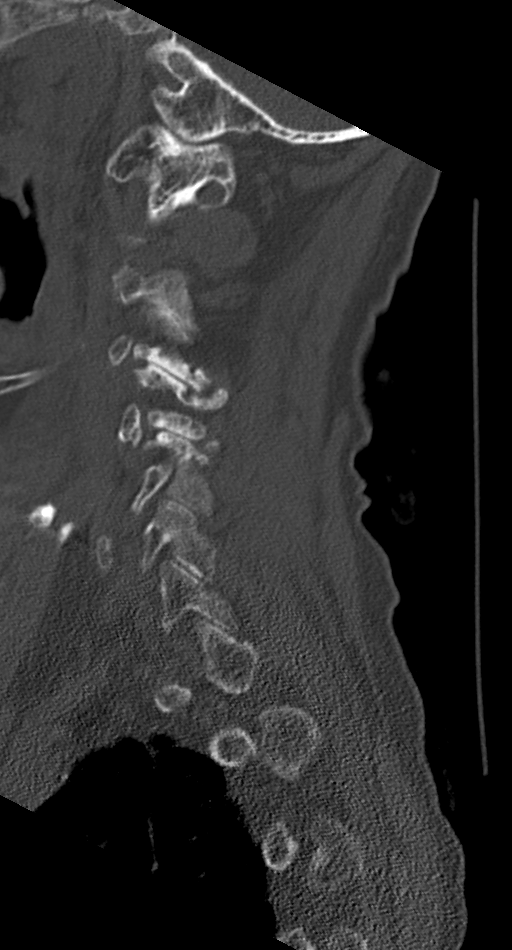

[Series 11: coronal bone · coronal · 0.30mm/px · 1 of 55 slices shown]
[im 28/55  bone]
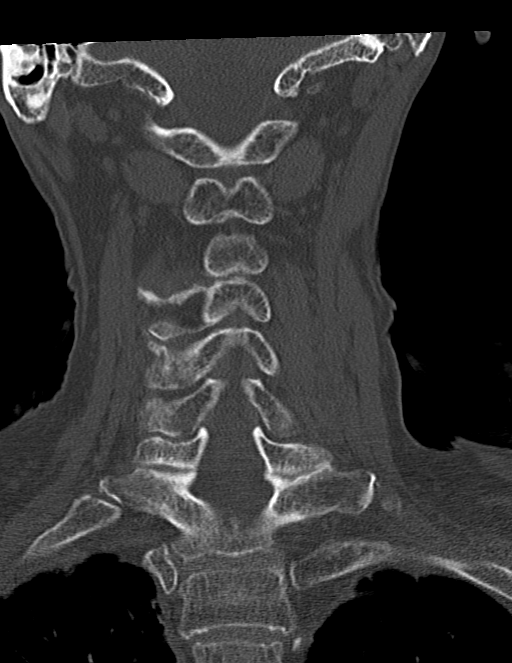

[Series 12: orthogonal bone · axial · 0.30mm/px · z∈[-313,-181]mm · 5 of 108 slices shown, 7 images]
[im 18/108  soft-tissue]
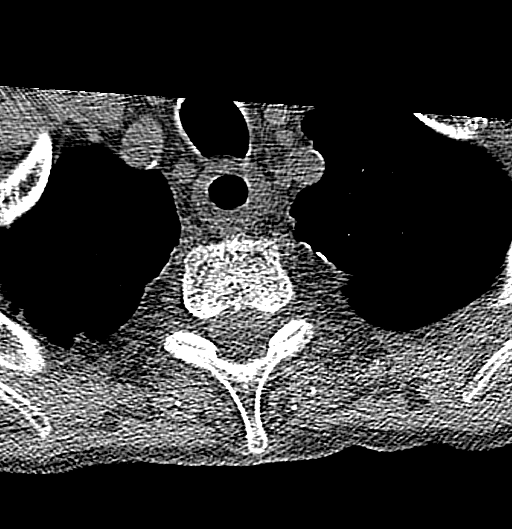
[im 18/108  bone]
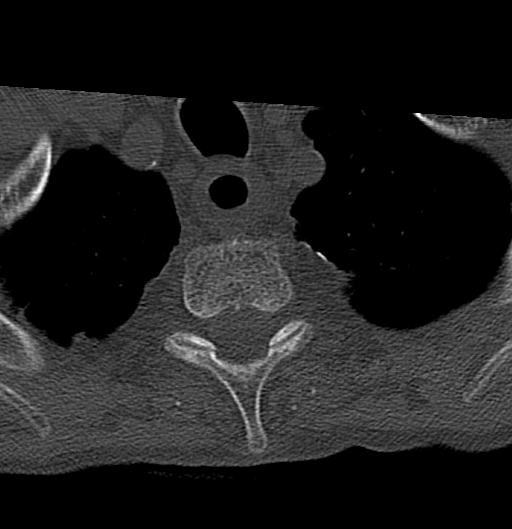
[im 36/108  bone]
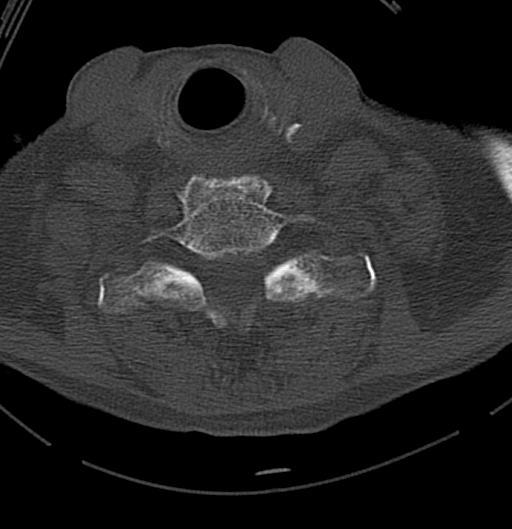
[im 54/108  bone]
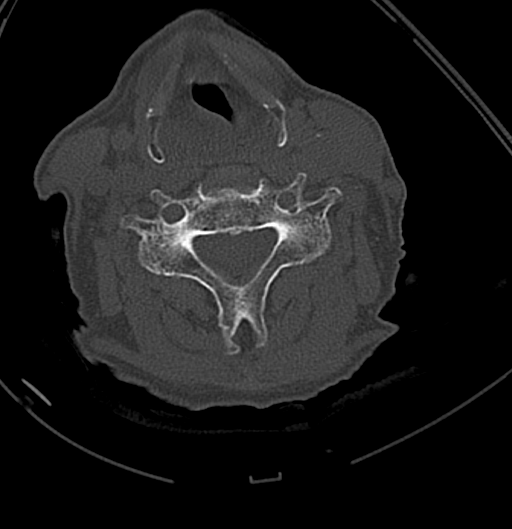
[im 72/108  bone]
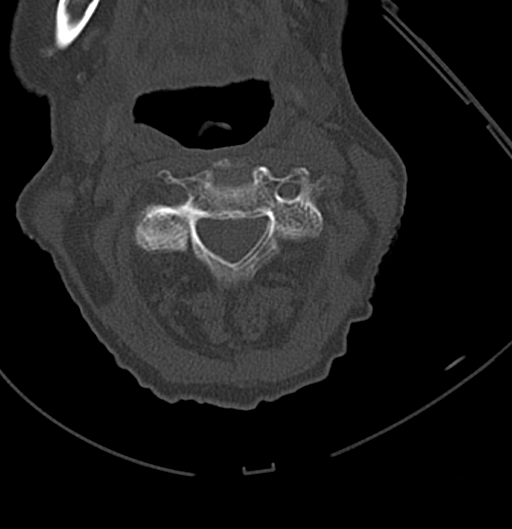
[im 90/108  soft-tissue]
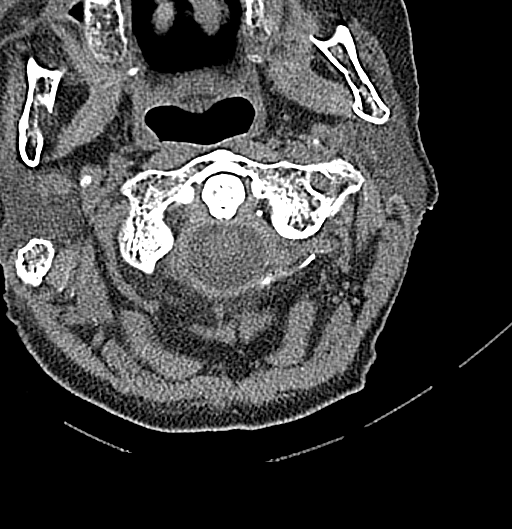
[im 90/108  bone]
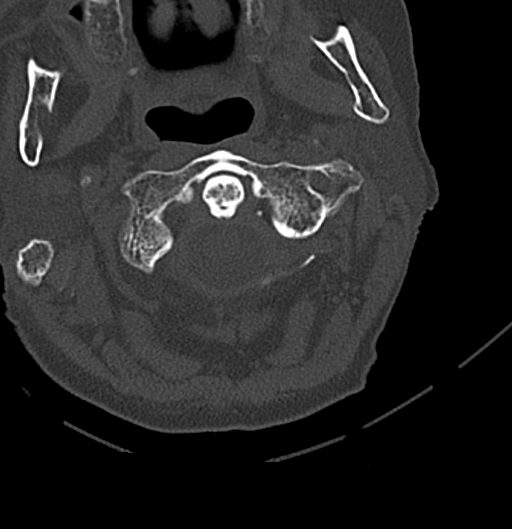

[11 of 33 positions shown; findings below may reference images not displayed]

FINDINGS: CT HEAD FINDINGS

Brain: Again noted is generalized parenchymal atrophy with
commensurate dilatation of the ventricles and sulci. Chronic small
vessel ischemic changes again noted within the bilateral
periventricular white matter regions.

There is no mass, hemorrhage, edema or other evidence of acute
parenchymal abnormality. No extra-axial hemorrhage.

Vascular: There are chronic calcified atherosclerotic changes of the
large vessels at the skull base. No unexpected hyperdense vessel.

Skull: Normal. Negative for fracture or focal lesion.

Sinuses/Orbits: No acute findings.

Other: None.

CT CERVICAL SPINE FINDINGS

Alignment: Stable.  No evidence of acute vertebral body subluxation.

Skull base and vertebrae: Interval fracture at the posterior aspect
of the C7 spinous process, but appears subacute in age. No acute
appearing fracture.

Soft tissues and spinal canal: No prevertebral fluid or swelling. No
visible canal hematoma.

Disc levels: Mild degenerative change again noted within the lower
cervical spine, stable. No significant central canal stenosis at any
level.

Upper chest: No acute findings. Scarring/fibrosis and emphysematous
blebs at each lung apex.

Other: Carotid atherosclerosis.
IMPRESSION: 1. No acute intracranial abnormality. No intracranial mass,
hemorrhage or edema. No skull fracture. Chronic small vessel
ischemic changes again noted within the white matter.
2. Fracture within the posterior aspect of the C7 spinous process,
new compared to the earlier CT of 10/10/2016, favored to be subacute
in age based on appearance, less likely acute. No acute vertebral
body fracture or subluxation.

## 2018-06-18 ENCOUNTER — Other Ambulatory Visit: Payer: Self-pay | Admitting: Adult Health

## 2018-06-21 ENCOUNTER — Emergency Department (HOSPITAL_COMMUNITY)
Admission: EM | Admit: 2018-06-21 | Discharge: 2018-06-22 | Disposition: A | Discharge status: Home / Self Care | Payer: Medicaid Other | Attending: Emergency Medicine | Admitting: Emergency Medicine

## 2018-06-21 DIAGNOSIS — Z87891 Personal history of nicotine dependence: Secondary | ICD-10-CM | POA: Diagnosis not present

## 2018-06-21 DIAGNOSIS — I5022 Chronic systolic (congestive) heart failure: Secondary | ICD-10-CM | POA: Diagnosis not present

## 2018-06-21 DIAGNOSIS — I110 Hypertensive heart disease with heart failure: Secondary | ICD-10-CM | POA: Diagnosis not present

## 2018-06-21 DIAGNOSIS — J449 Chronic obstructive pulmonary disease, unspecified: Secondary | ICD-10-CM | POA: Insufficient documentation

## 2018-06-21 DIAGNOSIS — F10929 Alcohol use, unspecified with intoxication, unspecified: Secondary | ICD-10-CM | POA: Diagnosis not present

## 2018-06-21 DIAGNOSIS — Z7982 Long term (current) use of aspirin: Secondary | ICD-10-CM | POA: Insufficient documentation

## 2018-06-21 DIAGNOSIS — Z79899 Other long term (current) drug therapy: Secondary | ICD-10-CM | POA: Diagnosis not present

## 2018-06-21 DIAGNOSIS — F10920 Alcohol use, unspecified with intoxication, uncomplicated: Secondary | ICD-10-CM

## 2018-06-21 DIAGNOSIS — R42 Dizziness and giddiness: Secondary | ICD-10-CM | POA: Diagnosis not present

## 2018-06-21 LAB — COMPREHENSIVE METABOLIC PANEL
ALT: 13 U/L (ref 0–44)
AST: 41 U/L (ref 15–41)
Albumin: 2.6 g/dL — ABNORMAL LOW (ref 3.5–5.0)
Alkaline Phosphatase: 88 U/L (ref 38–126)
Anion gap: 10 (ref 5–15)
BILIRUBIN TOTAL: 0.5 mg/dL (ref 0.3–1.2)
BUN: 5 mg/dL — AB (ref 8–23)
CO2: 20 mmol/L — ABNORMAL LOW (ref 22–32)
Calcium: 8.1 mg/dL — ABNORMAL LOW (ref 8.9–10.3)
Chloride: 104 mmol/L (ref 98–111)
Creatinine, Ser: 1.02 mg/dL (ref 0.61–1.24)
GFR calc Af Amer: >60 mL/min (ref >60)
Glucose, Bld: 111 mg/dL — ABNORMAL HIGH (ref 70–99)
Potassium: 4.3 mmol/L (ref 3.5–5.1)
Sodium: 134 mmol/L — ABNORMAL LOW (ref 135–145)
Total Protein: 6 g/dL — ABNORMAL LOW (ref 6.5–8.1)

## 2018-06-21 LAB — CBC
HEMATOCRIT: 31.5 % — AB (ref 39.0–52.0)
Hemoglobin: 9.6 g/dL — ABNORMAL LOW (ref 13.0–17.0)
MCH: 28.6 pg (ref 26.0–34.0)
MCHC: 30.5 g/dL (ref 30.0–36.0)
MCV: 93.8 fL (ref 80.0–100.0)
Platelets: 145 10*3/uL — ABNORMAL LOW (ref 150–400)
RBC: 3.36 MIL/uL — ABNORMAL LOW (ref 4.22–5.81)
RDW: 19 % — ABNORMAL HIGH (ref 11.5–15.5)
WBC: 4.4 10*3/uL (ref 4.0–10.5)
nRBC: 0 % (ref 0.0–0.2)

## 2018-06-21 LAB — ETHANOL: ALCOHOL ETHYL (B): 276 mg/dL — AB (ref <10)

## 2018-06-21 NOTE — ED Provider Notes (Signed)
[facility] EMERGENCY DEPARTMENT Provider Note   CSN: [id number] Arrival date & time: 06/21/18  1919    History   Chief Complaint Chief Complaint  Patient presents with  . Dizziness    HPI Caleb Carpenter is a 63 y.o. male.     Patient c/o generally feeling weak, gets lightheaded when stands. Symptoms gradual onset for past 1-2 months. Symptoms moderate, persistent, occur at rest, worse w standing - feels faint/dizzy as if may pass out. Denies syncope or injury. No headache. No chest pain or discomfort. Denies abd pain. No nvd. No recent wt loss. Denies cough or uri symptoms. No fever or chills. Denies any recent change in meds or new meds. Denies palpitations. No recent blood loss, rectal bleeding or melena.   The history is provided by the patient and the EMS personnel.  Dizziness  Associated symptoms: no blood in stool, no chest pain, no diarrhea, no headaches, no palpitations, no shortness of breath, no vomiting and no weakness     Past Medical History:  Diagnosis Date  . Asthma   . COPD (chronic obstructive pulmonary disease) (HCC)   . Hypertension   . 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 NSTEMI (non-ST elevated myocardial infarction) [facility])     Patient Active Problem List   Diagnosis Date Noted  . Insomnia 12/15/2017  . Chewing tobacco use 12/15/2017  . 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 COPD with asthma (HCC) 11/22/2017  . Dyslipidemia 11/22/2017  . Evaluation by psychiatric service required   . 3-vessel coronary artery disease 11/03/2017  . DNR (do not resuscitate) 11/03/2017  . Ischemic cardiomyopathy 11/03/2017  . Protein-calorie malnutrition, severe 10/30/2017  . Palliative care encounter   . STEMI (ST elevation myocardial infarction) (HCC) 10/28/2017  . Syncope 04/01/2017  . Dementia (HCC) 11/06/2016  . Chronic systolic heart  failure (HCC) 67/67/2094  . General weakness 09/03/2015  . HTN (hypertension) 09/02/2015  . Alcohol abuse 06/23/2015  . Gastritis     Past Surgical History:  Procedure Laterality Date  . CATARACT EXTRACTION, BILATERAL Bilateral   . ESOPHAGOGASTRODUODENOSCOPY (EGD) WITH PROPOFOL N/A 06/21/2015   Procedure: ESOPHAGOGASTRODUODENOSCOPY (EGD) WITH PROPOFOL;  Surgeon: Midge Minium, MD;  Location: [facility] ENDOSCOPY;  Service: Endoscopy;  Laterality: N/A;  . LEFT HEART CATH AND CORONARY ANGIOGRAPHY N/A 10/28/2017   Procedure: LEFT HEART CATH AND CORONARY ANGIOGRAPHY;  Surgeon: [place]il[place], Peter M, MD;  Location: [facility] INVASIVE CV LAB;  Service: Cardiovascular;  Laterality: N/A;        Home Medications    Prior to Admission medications   Medication Sig Start Date End Date Taking? Authorizing Provider  acetaminophen (TYLENOL) 325 MG tablet Take 2 tablets (650 mg total) by mouth every 4 (four) hours as needed for headache or mild pain. 11/03/17   Abelino Derrick, PA-C  albuterol (PROVENTIL HFA;VENTOLIN HFA) 108 (90 Base) MCG/ACT inhaler Inhale 2 puffs into the lungs every 4 (four) hours as needed for wheezing.     [provider]  aspirin 81 MG chewable tablet Chew 1 tablet (81 mg total) by mouth daily. 10/13/16   Adrian Saran, MD  atorvastatin (LIPITOR) 40 MG tablet Take 1 tablet (40 mg total) by mouth daily. 11/10/17 11/10/18  Kroeger, Ovidio Kin., PA-C  clopidogrel (PLAVIX) 75 MG tablet Take 1 tablet (75 mg total) by mouth daily with breakfast. 11/10/17   Kroeger, Ovidio Kin., PA-C  ENSURE PLUS (ENSURE PLUS) Anise Salvo  Take 237 mLs by mouth 3 (three) times daily. 11/13/17   [provider]  fluticasone (FLONASE) 50 MCG/ACT nasal spray Place 2 sprays into both nostrils daily.    [provider]  folic acid (FOLVITE) 1 MG tablet Take 1 tablet (1 mg total) by mouth daily. 11/10/17   Kroeger, Ovidio Kin., PA-C  losartan (COZAAR) 25 MG tablet Take 1 tablet (25 mg total) by mouth daily. 11/11/17   Kroeger,  Ovidio Kin., PA-C  metoprolol succinate (TOPROL XL) 50 MG 24 hr tablet Take 1 tablet (50 mg total) by mouth daily. Take with or immediately following a meal. 11/10/17 11/10/18  Kroeger, Ovidio Kin., PA-C  Multiple Vitamin (MULTIVITAMIN WITH MINERALS) TABS tablet Take 1 tablet by mouth daily.    [provider]  nitroGLYCERIN (NITROSTAT) 0.4 MG SL tablet Place 1 tablet (0.4 mg total) under the tongue every 5 (five) minutes as needed for chest pain. 11/10/17   Kroeger, Ovidio Kin., PA-C  Nutritional Supplements (NUTRITIONAL SUPPLEMENT PO) Regular Diet - Regular / Thin consistency    [provider]  ondansetron (ZOFRAN) 4 MG tablet Take 4 mg by mouth every 6 (six) hours as needed for nausea or vomiting. 11/15/17   [provider]  thiamine 100 MG tablet Take 100 mg by mouth daily.    [provider]  Tiotropium Bromide-Olodaterol (STIOLTO RESPIMAT) 2.5-2.5 MCG/ACT AERS Inhale 2 puffs into the lungs daily.    [provider]  traZODone (DESYREL) 50 MG tablet Take 50 mg by mouth at bedtime.    [provider]    Family History Family History  Problem Relation Age of Onset  . Diabetes Other   . Hypertension Other     Social History Social History   Tobacco Use  . Smoking status: Former Smoker    Types: Cigarettes    Last attempt to quit: 06/21/2014    Years since quitting: 4.0  . Smokeless tobacco: Current User    Types: Chew  Substance Use Topics  . Alcohol use: Yes    Alcohol/week: 2.0 standard drinks    Types: 2 Cans of beer per week    Comment: per day  . Drug use: No     Allergies   Codeine   Review of Systems Review of Systems  Constitutional: Negative for fever.  HENT: Negative for sore throat.   Eyes: Negative for redness.  Respiratory: Negative for cough and shortness of breath.   Cardiovascular: Negative for chest pain, palpitations and leg swelling.  Gastrointestinal: Negative for abdominal pain, blood in stool, diarrhea  and vomiting.  Endocrine: Negative for polyuria.  Genitourinary: Negative for dysuria and flank pain.  Musculoskeletal: Negative for back pain and neck pain.  Skin: Negative for rash.  Neurological: Positive for dizziness and light-headedness. Negative for speech difficulty, weakness, numbness and headaches.  Hematological: Does not bruise/bleed easily.  Psychiatric/Behavioral: Negative for confusion.     Physical Exam Updated Vital Signs BP 102/79   Pulse 81   Temp 97.7 F (36.5 C) (Oral)   Resp 14   SpO2 95%   Physical Exam Vitals signs and nursing note reviewed.  Constitutional:      Appearance: He is well-developed.     Comments: Very thin, frail appearing.   HENT:     Head: Atraumatic.     Nose: Nose normal.     Mouth/Throat:     Mouth: Mucous membranes are moist.     Pharynx: Oropharynx is clear.  Eyes:  General: No scleral icterus.    Conjunctiva/sclera: Conjunctivae normal.     Pupils: Pupils are equal, round, and reactive to light.  Neck:     Musculoskeletal: Normal range of motion and neck supple. No neck rigidity.     Trachea: No tracheal deviation.  Cardiovascular:     Rate and Rhythm: Normal rate and regular rhythm.     Pulses: Normal pulses.     Heart sounds: Normal heart sounds. No murmur. No friction rub. No gallop.   Pulmonary:     Effort: Pulmonary effort is normal. No accessory muscle usage or respiratory distress.     Breath sounds: Normal breath sounds.  Abdominal:     General: Bowel sounds are normal. There is no distension.     Palpations: Abdomen is soft. There is no mass.     Tenderness: There is no abdominal tenderness. There is no guarding.  Genitourinary:    Comments: No cva tenderness. Musculoskeletal:        General: No swelling or tenderness.     Right lower leg: No edema.     Left lower leg: No edema.  Skin:    General: Skin is warm and dry.     Findings: No rash.  Neurological:     Mental Status: He is alert.      Comments: Alert, speech clear/fluent. Motor/sens grossly intact bil.   Psychiatric:        Mood and Affect: Mood normal.      ED Treatments / Results  Labs (all labs ordered are listed, but only abnormal results are displayed) Results for orders placed or performed during the hospital encounter of 06/21/18  CBC  Result Value Ref Range   WBC 4.4 4.0 - 10.5 K/uL   RBC 3.36 (L) 4.22 - 5.81 MIL/uL   Hemoglobin 9.6 (L) 13.0 - 17.0 g/dL   HCT 40.9 (L) 81.1 - 91.4 %   MCV 93.8 80.0 - 100.0 fL   MCH 28.6 26.0 - 34.0 pg   MCHC 30.5 30.0 - 36.0 g/dL   RDW 78.2 (H) 95.6 - 21.3 %   Platelets 145 (L) 150 - 400 K/uL   nRBC 0.0 0.0 - 0.2 %  Comprehensive metabolic panel  Result Value Ref Range   Sodium 134 (L) 135 - 145 mmol/L   Potassium 4.3 3.5 - 5.1 mmol/L   Chloride 104 98 - 111 mmol/L   CO2 20 (L) 22 - 32 mmol/L   Glucose, Bld 111 (H) 70 - 99 mg/dL   BUN 5 (L) 8 - 23 mg/dL   Creatinine, Ser 0.86 0.61 - 1.24 mg/dL   Calcium 8.1 (L) 8.9 - 10.3 mg/dL   Total Protein 6.0 (L) 6.5 - 8.1 g/dL   Albumin 2.6 (L) 3.5 - 5.0 g/dL   AST 41 15 - 41 U/L   ALT 13 0 - 44 U/L   Alkaline Phosphatase 88 38 - 126 U/L   Total Bilirubin 0.5 0.3 - 1.2 mg/dL   GFR calc non Af Amer >60 >60 mL/min   GFR calc Af Amer >60 >60 mL/min   Anion gap 10 5 - 15  Ethanol  Result Value Ref Range   Alcohol, Ethyl (B) 276 (H) <10 mg/dL    EKG EKG Interpretation  Date/Time:  Sunday June 21 2018 19:31:29 EDT Ventricular Rate:  91 PR Interval:    QRS Duration: 94 QT Interval:  363 QTC Calculation: 445 R Axis:   70 Text Interpretation:  Sinus rhythm Low voltage QRS  No significant change since last tracing Confirmed by Cathren Laine ([id number]) on 06/21/2018 7:38:53 PM   Radiology No results found.  Procedures Procedures (including critical care time)  Medications Ordered in ED Medications - No data to display   Initial Impression / Assessment and Plan / ED Course  I have reviewed the triage vital signs  and the nursing notes.  Pertinent labs & imaging results that were available during my care of the patient were reviewed by me and considered in my medical decision making (see chart for details).  Iv ns. Labs sent. Ecg.  Reviewed nursing notes and prior charts for additional history.   Labs reviewed - hgb c/w baseline. etoh is high.  Po fluids. Food provided.  Patient is eating and drinking. Denies pain. No nv.   Patient is encouraged to avoid etoh use, follow up with AA, and will provide additional community resources for outpt f/u.   Pt ambulates, steady gait. No faintness. Vitals normal.   Pt currently appears stable for d/c.     Final Clinical Impressions(s) / ED Diagnoses   Final diagnoses:  None    ED Discharge Orders    None       Cathren Laine, MD 06/21/18 2201

## 2018-06-21 NOTE — Discharge Instructions (Addendum)
It was our pleasure to provide your ER care today - we hope that you feel better.  Avoid alcohol use - follow up with AA, and use resource guide provided for additional community resources.  Eat balanced diet, drink adequate liquids, consider supplementing nutrition with Boost, Ensure, or other nutritious shake.   Also follow up with primary care doctor in the coming week.  When standing after lying down, sit on edge of cough/bed for a few minutes prior to trying to stand, in order to try to decrease lightheadedness upon standing.   Return to ER if worse, new symptoms, high fevers, fainting, other concern.

## 2018-06-21 NOTE — ED Triage Notes (Signed)
Pt here for evaluation of multiple complaints. Mainly c/o dizziness and malaise that started 2 months ago. Vitals stable for EMS. Conscious alert and oriented x4.

## 2018-06-22 NOTE — ED Notes (Signed)
Per previous shift patient is waiting for ride and discharge instructions have been reviewed. Patient states he would like to be wheeled out to lobby for his family.

## 2018-06-26 ENCOUNTER — Inpatient Hospital Stay (HOSPITAL_COMMUNITY)
Admission: EM | Admit: 2018-06-26 | Discharge: 2018-06-28 | DRG: 190 | Disposition: A | Discharge status: Home Health | Payer: Medicaid Other | Attending: Internal Medicine | Admitting: Internal Medicine

## 2018-06-26 ENCOUNTER — Encounter (HOSPITAL_COMMUNITY): Payer: Self-pay | Admitting: Emergency Medicine

## 2018-06-26 ENCOUNTER — Emergency Department (HOSPITAL_COMMUNITY): Payer: Medicaid Other

## 2018-06-26 ENCOUNTER — Other Ambulatory Visit: Payer: Self-pay

## 2018-06-26 DIAGNOSIS — Z681 Body mass index (BMI) 19 or less, adult: Secondary | ICD-10-CM | POA: Diagnosis not present

## 2018-06-26 DIAGNOSIS — I252 Old myocardial infarction: Secondary | ICD-10-CM

## 2018-06-26 DIAGNOSIS — J441 Chronic obstructive pulmonary disease with (acute) exacerbation: Secondary | ICD-10-CM | POA: Diagnosis not present

## 2018-06-26 DIAGNOSIS — I2510 Atherosclerotic heart disease of native coronary artery without angina pectoris: Secondary | ICD-10-CM | POA: Diagnosis not present

## 2018-06-26 DIAGNOSIS — R0602 Shortness of breath: Secondary | ICD-10-CM | POA: Diagnosis not present

## 2018-06-26 DIAGNOSIS — Z79899 Other long term (current) drug therapy: Secondary | ICD-10-CM | POA: Diagnosis not present

## 2018-06-26 DIAGNOSIS — J449 Chronic obstructive pulmonary disease, unspecified: Secondary | ICD-10-CM

## 2018-06-26 DIAGNOSIS — J9601 Acute respiratory failure with hypoxia: Secondary | ICD-10-CM | POA: Diagnosis present

## 2018-06-26 DIAGNOSIS — F0390 Unspecified dementia without behavioral disturbance: Secondary | ICD-10-CM | POA: Diagnosis present

## 2018-06-26 DIAGNOSIS — Z66 Do not resuscitate: Secondary | ICD-10-CM | POA: Diagnosis present

## 2018-06-26 DIAGNOSIS — Z7951 Long term (current) use of inhaled steroids: Secondary | ICD-10-CM

## 2018-06-26 DIAGNOSIS — I110 Hypertensive heart disease with heart failure: Secondary | ICD-10-CM | POA: Diagnosis present

## 2018-06-26 DIAGNOSIS — E860 Dehydration: Secondary | ICD-10-CM | POA: Diagnosis present

## 2018-06-26 DIAGNOSIS — Z7982 Long term (current) use of aspirin: Secondary | ICD-10-CM | POA: Diagnosis not present

## 2018-06-26 DIAGNOSIS — F1010 Alcohol abuse, uncomplicated: Secondary | ICD-10-CM | POA: Diagnosis present

## 2018-06-26 DIAGNOSIS — I255 Ischemic cardiomyopathy: Secondary | ICD-10-CM | POA: Diagnosis present

## 2018-06-26 DIAGNOSIS — F17220 Nicotine dependence, chewing tobacco, uncomplicated: Secondary | ICD-10-CM | POA: Diagnosis present

## 2018-06-26 DIAGNOSIS — I5022 Chronic systolic (congestive) heart failure: Secondary | ICD-10-CM | POA: Diagnosis present

## 2018-06-26 DIAGNOSIS — Z7189 Other specified counseling: Secondary | ICD-10-CM

## 2018-06-26 DIAGNOSIS — Z8249 Family history of ischemic heart disease and other diseases of the circulatory system: Secondary | ICD-10-CM | POA: Diagnosis not present

## 2018-06-26 DIAGNOSIS — Z515 Encounter for palliative care: Secondary | ICD-10-CM | POA: Diagnosis not present

## 2018-06-26 DIAGNOSIS — Z833 Family history of diabetes mellitus: Secondary | ICD-10-CM

## 2018-06-26 DIAGNOSIS — E43 Unspecified severe protein-calorie malnutrition: Secondary | ICD-10-CM | POA: Diagnosis present

## 2018-06-26 DIAGNOSIS — Z7902 Long term (current) use of antithrombotics/antiplatelets: Secondary | ICD-10-CM | POA: Diagnosis not present

## 2018-06-26 LAB — CBC
HCT: 35.6 % — ABNORMAL LOW (ref 39.0–52.0)
Hemoglobin: 10.6 g/dL — ABNORMAL LOW (ref 13.0–17.0)
MCH: 27.6 pg (ref 26.0–34.0)
MCHC: 29.8 g/dL — ABNORMAL LOW (ref 30.0–36.0)
MCV: 92.7 fL (ref 80.0–100.0)
Platelets: 161 10*3/uL (ref 150–400)
RBC: 3.84 MIL/uL — AB (ref 4.22–5.81)
RDW: 18.1 % — ABNORMAL HIGH (ref 11.5–15.5)
WBC: 4.8 10*3/uL (ref 4.0–10.5)
nRBC: 0 % (ref 0.0–0.2)

## 2018-06-26 LAB — BASIC METABOLIC PANEL
ANION GAP: 12 (ref 5–15)
BUN: <5 mg/dL — ABNORMAL LOW (ref 8–23)
CHLORIDE: 98 mmol/L (ref 98–111)
CO2: 20 mmol/L — ABNORMAL LOW (ref 22–32)
Calcium: 8.5 mg/dL — ABNORMAL LOW (ref 8.9–10.3)
Creatinine, Ser: 0.72 mg/dL (ref 0.61–1.24)
GFR calc Af Amer: >60 mL/min (ref >60)
GFR calc non Af Amer: >60 mL/min (ref >60)
Glucose, Bld: 81 mg/dL (ref 70–99)
Potassium: 4.5 mmol/L (ref 3.5–5.1)
Sodium: 130 mmol/L — ABNORMAL LOW (ref 135–145)

## 2018-06-26 LAB — I-STAT TROPONIN, ED: Troponin i, poc: 0 ng/mL (ref 0.00–0.08)

## 2018-06-26 LAB — PROTIME-INR
INR: 1 (ref 0.8–1.2)
Prothrombin Time: 13.2 seconds (ref 11.4–15.2)

## 2018-06-26 LAB — URINALYSIS, ROUTINE W REFLEX MICROSCOPIC
Bilirubin Urine: NEGATIVE
Glucose, UA: NEGATIVE mg/dL
Hgb urine dipstick: NEGATIVE
Ketones, ur: NEGATIVE mg/dL
Leukocytes,Ua: NEGATIVE
Nitrite: NEGATIVE
PROTEIN: NEGATIVE mg/dL
Specific Gravity, Urine: 1.008 (ref 1.005–1.030)
pH: 5 (ref 5.0–8.0)

## 2018-06-26 LAB — BRAIN NATRIURETIC PEPTIDE: B Natriuretic Peptide: 92.8 pg/mL (ref 0.0–100.0)

## 2018-06-26 LAB — LACTIC ACID, PLASMA
Lactic Acid, Venous: 3.9 mmol/L (ref 0.5–1.9)
Lactic Acid, Venous: 3.4 mmol/L (ref 0.5–1.9)

## 2018-06-26 MED ORDER — SODIUM CHLORIDE 0.9% FLUSH
3.0000 mL | Freq: Two times a day (BID) | INTRAVENOUS | Status: DC
  Administered 2018-06-26 – 2018-06-28 (×4): 3 mL via INTRAVENOUS

## 2018-06-26 MED ORDER — SODIUM CHLORIDE 0.9 % IV BOLUS
500.0000 mL | Freq: Once | INTRAVENOUS | Status: AC
  Administered 2018-06-26: 500 mL via INTRAVENOUS

## 2018-06-26 MED ORDER — VITAMIN B-1 100 MG PO TABS
100.0000 mg | ORAL_TABLET | Freq: Every day | ORAL | Status: DC
  Administered 2018-06-26 – 2018-06-27 (×2): 100 mg via ORAL
  Filled 2018-06-26 (×2): qty 1

## 2018-06-26 MED ORDER — DOXYCYCLINE HYCLATE 100 MG PO TABS
100.0000 mg | ORAL_TABLET | Freq: Two times a day (BID) | ORAL | Status: DC
  Administered 2018-06-26 – 2018-06-27 (×2): 100 mg via ORAL
  Filled 2018-06-26 (×2): qty 1

## 2018-06-26 MED ORDER — TETANUS-DIPHTH-ACELL PERTUSSIS 5-2.5-18.5 LF-MCG/0.5 IM SUSP
0.5000 mL | Freq: Once | INTRAMUSCULAR | Status: AC
  Administered 2018-06-26: 0.5 mL via INTRAMUSCULAR
  Filled 2018-06-26: qty 0.5

## 2018-06-26 MED ORDER — IPRATROPIUM-ALBUTEROL 0.5-2.5 (3) MG/3ML IN SOLN
3.0000 mL | Freq: Once | RESPIRATORY_TRACT | Status: AC
  Administered 2018-06-26: 3 mL via RESPIRATORY_TRACT
  Filled 2018-06-26: qty 3

## 2018-06-26 MED ORDER — FOLIC ACID 1 MG PO TABS
1.0000 mg | ORAL_TABLET | Freq: Every day | ORAL | Status: DC
  Administered 2018-06-26 – 2018-06-27 (×2): 1 mg via ORAL
  Filled 2018-06-26 (×2): qty 1

## 2018-06-26 MED ORDER — ENOXAPARIN SODIUM 40 MG/0.4ML ~~LOC~~ SOLN
40.0000 mg | SUBCUTANEOUS | Status: DC
  Administered 2018-06-26 – 2018-06-27 (×2): 40 mg via SUBCUTANEOUS
  Filled 2018-06-26 (×2): qty 0.4

## 2018-06-26 MED ORDER — METHYLPREDNISOLONE SODIUM SUCC 125 MG IJ SOLR
125.0000 mg | Freq: Once | INTRAMUSCULAR | Status: AC
  Administered 2018-06-26: 125 mg via INTRAVENOUS
  Filled 2018-06-26: qty 2

## 2018-06-26 MED ORDER — LORAZEPAM 1 MG PO TABS: 0.0000 mg | ORAL_TABLET | Freq: Four times a day (QID) | ORAL | Status: DC

## 2018-06-26 MED ORDER — LORAZEPAM 2 MG/ML IJ SOLN: 1.0000 mg | Freq: Four times a day (QID) | INTRAMUSCULAR | Status: DC | PRN

## 2018-06-26 MED ORDER — LORAZEPAM 1 MG PO TABS: 0.0000 mg | ORAL_TABLET | Freq: Two times a day (BID) | ORAL | Status: DC

## 2018-06-26 MED ORDER — LORAZEPAM 1 MG PO TABS: 1.0000 mg | ORAL_TABLET | Freq: Four times a day (QID) | ORAL | Status: DC | PRN

## 2018-06-26 MED ORDER — THIAMINE HCL 100 MG/ML IJ SOLN: 100.0000 mg | Freq: Every day | INTRAMUSCULAR | Status: DC

## 2018-06-26 MED ORDER — PREDNISONE 20 MG PO TABS
40.0000 mg | ORAL_TABLET | Freq: Every day | ORAL | Status: DC
  Administered 2018-06-27 – 2018-06-28 (×2): 40 mg via ORAL
  Filled 2018-06-26 (×2): qty 2

## 2018-06-26 MED ORDER — ADULT MULTIVITAMIN W/MINERALS CH
1.0000 | ORAL_TABLET | Freq: Every day | ORAL | Status: DC
  Administered 2018-06-26 – 2018-06-27 (×2): 1 via ORAL
  Filled 2018-06-26 (×2): qty 1

## 2018-06-26 NOTE — Progress Notes (Signed)
Patient has low bed ordered due to fall history.  Attempted to put patient in low bed.  Pt refusing to change beds at this time.

## 2018-06-26 NOTE — ED Triage Notes (Signed)
Pt BIB [facility] for a SOB after a fall. [facility] advised when they got on scene the pt was on the floor and he was very weak & pale. EMS advised they took vital signs and his initial room air oxygen saturation of 81%. EMS advised they placed the pt on a nasal cannula and his saturation increased to 84% on 3 liters. EMS advised they followed protocol and placed the pt on a NRB @ 15lpm with the best oxygen saturation of 89%. Pt denies any SOB however does admit to a hx of COPD. Pt also complains of pain to the right elbow and bilateral knees from the initial fall. EMS advised vital signs stable as noted.

## 2018-06-26 NOTE — H&P (Signed)
History and Physical    Caleb Carpenter ZOX:[id number] DOB: [birth year] DOA: 06/26/2018  PCP: Evelene Croon, MD  Patient coming from: Home  I have personally briefly reviewed patient's old medical records in [facility] Link  Chief Complaint: Shortness of breath  HPI: Caleb Carpenter is a 63 y.o. male with medical history significant of alcohol related dementia, hypertension, COPD, systolic congestive heart failure with an EF of 20 to 25% presents to the emergency department via EMS status post a ground-level fall that occurred earlier today.  Patient's friend on the scene reported that he had not been eating or drinking for the past couple days.  Patient says he did not hit his head or have loss of consciousness.  He is complaining of pain in his elbow where there is a skin tear from the fall.  He complains of shortness of breath and mild abdominal pain.  He is unable to state if his shortness of breath is baseline or worse than normal.  He denies fevers nausea vomiting or chest pain.  In the emergency department his sats dropped into the 80s on room air and oxygen was begun.  ED Course: X-rays negative for any fractures, typically right elbow without fracture, to statin the low 80s improved with a nonrebreather and able to wean him down to nasal cannula.  Mild hyponatremia, desaturates when he sits up but currently stable on 4 L nasal cannula, to me for further evaluation and management  Review of Systems: As per HPI otherwise all other systems reviewed and  negative.   Past Medical History:  Diagnosis Date  . Asthma   . COPD (chronic obstructive pulmonary disease) (HCC)   . Hypertension   . 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 NSTEMI (non-ST elevated myocardial infarction) [facility])     Past Surgical History:  Procedure Laterality Date  . CATARACT EXTRACTION, BILATERAL Bilateral   . ESOPHAGOGASTRODUODENOSCOPY (EGD) WITH PROPOFOL N/A  06/21/2015   Procedure: ESOPHAGOGASTRODUODENOSCOPY (EGD) WITH PROPOFOL;  Surgeon: Midge Minium, MD;  Location: [facility] ENDOSCOPY;  Service: Endoscopy;  Laterality: N/A;  . LEFT HEART CATH AND CORONARY ANGIOGRAPHY N/A 10/28/2017   Procedure: LEFT HEART CATH AND CORONARY ANGIOGRAPHY;  Surgeon: Swazil[place], Peter M, MD;  Location: [facility] INVASIVE CV LAB;  Service: Cardiovascular;  Laterality: N/A;    Social History   Social History Narrative  . Not on file     reports that he quit smoking about 4 years ago. His smoking use included cigarettes. His smokeless tobacco use includes chew. He reports current alcohol use of about 2.0 standard drinks of alcohol per week. He reports that he does not use drugs.  Allergies  Allergen Reactions  . Codeine Other (See Comments)    ams    Family History  Problem Relation Age of Onset  . Diabetes Other   . Hypertension Other      Prior to Admission medications   Medication Sig Start Date End Date Taking? Authorizing Provider  acetaminophen (TYLENOL) 325 MG tablet Take 2 tablets (650 mg total) by mouth every 4 (four) hours as needed for headache or mild pain. 11/03/17   Abelino Derrick, PA-C  albuterol (PROVENTIL HFA;VENTOLIN HFA) 108 (90 Base) MCG/ACT inhaler Inhale 2 puffs into the lungs every 4 (four) hours as needed for wheezing.     [provider]  aspirin 81 MG chewable tablet Chew 1 tablet (81 mg total) by mouth daily. 10/13/16   Adrian Saran, MD  atorvastatin (LIPITOR) 40 MG tablet Take 1 tablet (40 mg total) by mouth daily. 11/10/17 11/10/18  Kroeger, Ovidio Kin., PA-C  clopidogrel (PLAVIX) 75 MG tablet Take 1 tablet (75 mg total) by mouth daily with breakfast. 11/10/17   Kroeger, Ovidio Kin., PA-C  ENSURE PLUS (ENSURE PLUS) LIQD Take 237 mLs by mouth 3 (three) times daily. 11/13/17   [provider]  fluticasone (FLONASE) 50 MCG/ACT nasal spray Place 2 sprays into both nostrils daily.    [provider]  folic acid (FOLVITE) 1 MG tablet Take 1  tablet (1 mg total) by mouth daily. 11/10/17   Kroeger, Ovidio Kin., PA-C  losartan (COZAAR) 25 MG tablet Take 1 tablet (25 mg total) by mouth daily. 11/11/17   Kroeger, Ovidio Kin., PA-C  metoprolol succinate (TOPROL XL) 50 MG 24 hr tablet Take 1 tablet (50 mg total) by mouth daily. Take with or immediately following a meal. 11/10/17 11/10/18  Kroeger, Ovidio Kin., PA-C  Multiple Vitamin (MULTIVITAMIN WITH MINERALS) TABS tablet Take 1 tablet by mouth daily.    [provider]  nitroGLYCERIN (NITROSTAT) 0.4 MG SL tablet Place 1 tablet (0.4 mg total) under the tongue every 5 (five) minutes as needed for chest pain. 11/10/17   Kroeger, Ovidio Kin., PA-C  Nutritional Supplements (NUTRITIONAL SUPPLEMENT PO) Regular Diet - Regular / Thin consistency    [provider]  ondansetron (ZOFRAN) 4 MG tablet Take 4 mg by mouth every 6 (six) hours as needed for nausea or vomiting. 11/15/17   [provider]  thiamine 100 MG tablet Take 100 mg by mouth daily.    [provider]  Tiotropium Bromide-Olodaterol (STIOLTO RESPIMAT) 2.5-2.5 MCG/ACT AERS Inhale 2 puffs into the lungs daily.    [provider]  traZODone (DESYREL) 50 MG tablet Take 50 mg by mouth at bedtime.    [provider]    Physical Exam:  Constitutional: NAD, calm, comfortable seems confused thin frail Vitals:   06/26/18 1523 06/26/18 1526 06/26/18 1527 06/26/18 1528  BP:      Pulse:      Resp:   19 19  Temp:      TempSrc:      SpO2: 98% 100%  100%  Weight:      Height:       Eyes: PERRL, lids and conjunctivae normal ENMT: Mucous membranes are moist. Posterior pharynx clear of any exudate or lesions.Normal dentition.  Neck: normal, supple, no masses, no thyromegaly Respiratory: Her inspiratory effort with wheezing bilaterall. No accessory muscle use.  Cardiovascular: Regular rate and rhythm, no murmurs / rubs / gallops. No extremity edema. 2+ pedal pulses. No carotid bruits.  Abdomen: no  tenderness, no masses palpated. No hepatosplenomegaly. Bowel sounds positive.  Musculoskeletal: no clubbing / cyanosis. No joint deformity upper and lower extremities. Good ROM, no contractures. Normal muscle tone.  Skin: no rashes, lesions, ulcers. No induration Neurologic: CN 2-12 grossly intact. Sensation intact, DTR normal. Strength 5/5 in all 4.  Psychiatric: Normal judgment and insight. Alert and oriented x 3. Normal mood.    Labs on Admission: I have personally reviewed following labs and imaging studies  CBC: Recent Labs  Lab 06/21/18 1936 06/26/18 1247  WBC 4.4 4.8  HGB 9.6* 10.6*  HCT 31.5* 35.6*  MCV 93.8 92.7  PLT 145* 161   Basic Metabolic Panel: Recent Labs  Lab 06/21/18 1936 06/26/18 1247  NA 134* 130*  K 4.3 4.5  CL 104 98  CO2 20* 20*  GLUCOSE 111*  81  BUN 5* <5*  CREATININE 1.02 0.72  CALCIUM 8.1* 8.5*   GFR: Estimated Creatinine Clearance: 67.3 mL/min (by C-G formula based on SCr of 0.72 mg/dL). Liver Function Tests: Recent Labs  Lab 06/21/18 1936  AST 41  ALT 13  ALKPHOS 88  BILITOT 0.5  PROT 6.0*  ALBUMIN 2.6*    Coagulation Profile: Recent Labs  Lab 06/26/18 1247  INR 1.0   Urine analysis:    Component Value Date/Time   COLORURINE YELLOW 10/28/2017 2316   APPEARANCEUR CLEAR 10/28/2017 2316   LABSPEC 1.040 (H) 10/28/2017 2316   PHURINE 6.0 10/28/2017 2316   GLUCOSEU NEGATIVE 10/28/2017 2316   HGBUR LARGE (A) 10/28/2017 2316   BILIRUBINUR NEGATIVE 10/28/2017 2316   KETONESUR 5 (A) 10/28/2017 2316   PROTEINUR NEGATIVE 10/28/2017 2316   NITRITE NEGATIVE 10/28/2017 2316   LEUKOCYTESUR NEGATIVE 10/28/2017 2316    Radiological Exams on Admission: Dg Chest 2 View  Result Date: 06/26/2018 CLINICAL DATA:  Short of breath COPD EXAM: CHEST - 2 VIEW COMPARISON:  04/07/2018 FINDINGS: COPD with pulmonary hyperinflation and emphysema. Heart size normal. Negative for pneumonia. Negative for heart failure or effusion. Chronic rib fractures  bilaterally. Chronic fracture approximately T11 or T12. 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 IMPRESSION: COPD.  No acute cardiopulmonary abnormality. Electronically Signed   By: Marlan Palau M.D.   On: 06/26/2018 14:16   Dg Elbow Complete Right  Result Date: 06/26/2018 CLINICAL DATA:  Fall.  Elbow pain EXAM: RIGHT ELBOW - COMPLETE 3+ VIEW COMPARISON:  09/26/2015 FINDINGS: Negative for fracture or dislocation. No significant degenerative change or effusion. Osteopenia.  Arterial calcification. IMPRESSION: Negative for fracture. Electronically Signed   By: Marlan Palau M.D.   On: 06/26/2018 14:18    EKG: Independently reviewed.  Sinus rhythm no change when compared to 06/21/2018  Assessment/Plan Principal Problem:   COPD with asthma (HCC) Active Problems:   Chronic systolic heart failure (HCC)   Ischemic cardiomyopathy   Protein-calorie malnutrition, severe   Alcohol abuse   Palliative care encounter   3-vessel coronary artery disease   Acute exacerbation of chronic obstructive pulmonary disease (COPD) (HCC)    1.  Acute exacerbation of COPD with asthma: Oral steroids ordered to continue from IV steroids ordered in the emergency department, will use ipratropium and albuterol nebs, continue oxygen supplementation, patient does not appear to be infected if does not improve by a.m. may want to consider adding doxycycline.  2.  Chronic systolic heart failure: Noted continue home medication regimen and monitor fluid intake and daily weights.  3.  Ischemic cardiomyopathy: As above patient with a very poor low EF.  Has three-vessel coronary disease was deemed not a candidate for CABG or intervention due to EF of 15%, severe cachexia, COPD and a history of chronic alcohol use.  Was discharged home to skilled facility with plans for hospice but improved with treatment and hospice was felt not necessary.  4.  Protein calorie malnutrition severe:  Again presents with protein calorie malnutrition likely because the patient gets money of his calories from alcohol and not from food.  Will encourage healthy eating habits.  5.  Alcohol abuse: We will start him on oral floor based withdrawal prophylaxis per protocol.  6.  Goals of care: Palliative care has once again been consulted.  Patient will likely need discharge to hospice.    DVT prophylaxis: Lovenox Code Status: DNR as previously and after discussion with patient Family Communication: Family present  Disposition Plan: Needs inpatient hospice care as cannot care for himself at home Consults called: Palliative care Admission status: Inpatient   Lahoma Crocker MD FACP [facility] Pager [phone]  How to contact the [facility] Attending or Consulting provider 7A - 7P or covering provider during after hours 7P -7A, for this patient?  1. Check the care team in [facility] and look for a) attending/consulting [facility] provider listed and b) the [facility] team listed 2. Log into [email or web address] and use [place]'s universal password to access. If you do not have the password, please contact the hospital operator. 3. Locate the [facility] provider you are looking for under [facility] and page to a number that you can be directly reached. 4. If you still have difficulty reaching the provider, please page the [facility] (Director on Call) for the Hospitalists listed on amion for assistance.  If 7PM-7AM, please contact night-coverage [email or web address] Password [facility]  06/26/2018, 3:56 PM

## 2018-06-26 NOTE — ED Notes (Signed)
ED TO INPATIENT HANDOFF REPORT  ED Nurse Name and Phone #:  Alexia Freestone, RN [phone]  S Name/Age/Gender Caleb Carpenter 63 y.o. male Room/Bed: 022C/022C  Code Status   Code Status: DNR  Home/SNF/Other Home Patient oriented to: self, place, time and situation Is this baseline? Pt is unable to name the president  Triage Complete: Triage complete  Chief Complaint SOB  Triage Note Pt BIB [facility] for a SOB after a fall. [facility] advised when they got on scene the pt was on the floor and he was very weak & pale. EMS advised they took vital signs and his initial room air oxygen saturation of 81%. EMS advised they placed the pt on a nasal cannula and his saturation increased to 84% on 3 liters. EMS advised they followed protocol and placed the pt on a NRB @ 15lpm with the best oxygen saturation of 89%. Pt denies any SOB however does admit to a hx of COPD. Pt also complains of pain to the right elbow and bilateral knees from the initial fall. EMS advised vital signs stable as noted.    Allergies Allergies  Allergen Reactions  . Codeine Other (See Comments)    ams    Level of Care/Admitting Diagnosis ED Disposition    ED Disposition Condition Comment   Admit  Hospital Area: [facility] [[id number]]  Level of Care: Telemetry Cardiac [103]  Diagnosis: Acute exacerbation of chronic obstructive pulmonary disease (COPD) [facility]) [[id number]]  Admitting Physician: Lahoma Crocker [[id number]]  Attending Physician: Lahoma Crocker [[id number]]  Estimated length of stay: 3 - 4 days  Certification:: I certify this patient will need inpatient services for at least 2 midnights  PT Class (Do Not Modify): Inpatient [101]  PT Acc Code (Do Not Modify): Private [1]       B Medical/Surgery History Past Medical History:  Diagnosis Date  . Asthma   . COPD (chronic obstructive pulmonary disease) (HCC)   . Hypertension   . Hypertensive heart disease with chronic systolic congestive heart failure (HCC)  11/22/2017  . NSTEMI (non-ST elevated myocardial infarction) [facility])    Past Surgical History:  Procedure Laterality Date  . CATARACT EXTRACTION, BILATERAL Bilateral   . ESOPHAGOGASTRODUODENOSCOPY (EGD) WITH PROPOFOL N/A 06/21/2015   Procedure: ESOPHAGOGASTRODUODENOSCOPY (EGD) WITH PROPOFOL;  Surgeon: Midge Minium, MD;  Location: [facility] ENDOSCOPY;  Service: Endoscopy;  Laterality: N/A;  . LEFT HEART CATH AND CORONARY ANGIOGRAPHY N/A 10/28/2017   Procedure: LEFT HEART CATH AND CORONARY ANGIOGRAPHY;  Surgeon: Swaziland, Peter M, MD;  Location: [facility] INVASIVE CV LAB;  Service: Cardiovascular;  Laterality: N/A;     A IV Location/Drains/Wounds Patient Lines/Drains/Airways Status   Active Line/Drains/Airways    Name:   Placement date:   Placement time:   Site:   Days:   Peripheral IV 06/26/18 Left Antecubital   06/26/18    1241    Antecubital   less than 1   Pressure Injury 10/28/17 Stage I -  Intact skin with non-blanchable redness of a localized area usually over a bony prominence. intact, flaky dry skin present    10/28/17    1345     241          Intake/Output Last 24 hours No intake or output data in the 24 hours ending 06/26/18 1611  Labs/Imaging Results for orders placed or performed during the hospital encounter of 06/26/18 (from the past 48 hour(s))  Basic metabolic panel     Status: Abnormal   Collection Time: 06/26/18 12:47  PM  Result Value Ref Range   Sodium 130 (L) 135 - 145 mmol/L   Potassium 4.5 3.5 - 5.1 mmol/L   Chloride 98 98 - 111 mmol/L   CO2 20 (L) 22 - 32 mmol/L   Glucose, Bld 81 70 - 99 mg/dL   BUN <5 (L) 8 - 23 mg/dL   Creatinine, Ser 1.610.72 0.61 - 1.24 mg/dL   Calcium 8.5 (L) 8.9 - 10.3 mg/dL   GFR calc non Af Amer >60 >60 mL/min   GFR calc Af Amer >60 >60 mL/min   Anion gap 12 5 - 15    Comment: Performed at [facility], [address] [place], [place] [address]  CBC     Status: Abnormal   Collection Time: 06/26/18 12:47 PM  Result Value Ref Range   WBC 4.8  4.0 - 10.5 K/uL   RBC 3.84 (L) 4.22 - 5.81 MIL/uL   Hemoglobin 10.6 (L) 13.0 - 17.0 g/dL   HCT 54.035.6 (L) 98.139.0 - 19.152.0 %   MCV 92.7 80.0 - 100.0 fL   MCH 27.6 26.0 - 34.0 pg   MCHC 29.8 (L) 30.0 - 36.0 g/dL   RDW 47.818.1 (H) 29.511.5 - 62.115.5 %   Platelets 161 150 - 400 K/uL   nRBC 0.0 0.0 - 0.2 %    Comment: Performed at [facility], [address] [place], [place] [address]  Lactic acid, plasma     Status: Abnormal   Collection Time: 06/26/18 12:47 PM  Result Value Ref Range   Lactic Acid, Venous 3.9 (HH) 0.5 - 1.9 mmol/L    Comment: CRITICAL RESULT CALLED TO, READ BACK BY AND VERIFIED WITH: [facility].MCCORD,RN 1408 06/26/2018 CLARK,S Performed at [facility], [address] [place], [place] [address]   Protime-INR     Status: None   Collection Time: 06/26/18 12:47 PM  Result Value Ref Range   Prothrombin Time 13.2 11.4 - 15.2 seconds   INR 1.0 0.8 - 1.2    Comment: (NOTE) INR goal varies based on device and disease states. Performed at [facility], [address] [place], [place] [address]   Brain natriuretic peptide     Status: None   Collection Time: 06/26/18 12:47 PM  Result Value Ref Range   B Natriuretic Peptide 92.8 0.0 - 100.0 pg/mL    Comment: Performed at [facility], [address] [place], [place] [address]  I-stat troponin, ED     Status: None   Collection Time: 06/26/18  1:01 PM  Result Value Ref Range   Troponin i, poc 0.00 0.00 - 0.08 ng/mL   Comment 3            Comment: Due to the release kinetics of cTnI, a negative result within the first hours of the onset of symptoms does not rule out myocardial infarction with certainty. If myocardial infarction is still suspected, repeat the test at appropriate intervals.   Lactic acid, plasma     Status: Abnormal   Collection Time: 06/26/18  3:20 PM  Result Value Ref Range   Lactic Acid, Venous 3.4 (HH) 0.5 - 1.9 mmol/L    Comment: CRITICAL RESULT CALLED TO, READ BACK BY AND VERIFIED WITH: P  Mikle Sternberg,RN 1556 06/26/2018 D BRADLEY Performed at [facility], [address] [place], [place] [address]    Dg Chest 2 View  Result Date: 06/26/2018 CLINICAL DATA:  Short of breath COPD EXAM: CHEST - 2  VIEW COMPARISON:  04/07/2018 FINDINGS: COPD with pulmonary hyperinflation and emphysema. Heart size normal. Negative for pneumonia. Negative for heart failure or effusion. Chronic rib fractures bilaterally. Chronic fracture approximately T11 or T12. 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 IMPRESSION: COPD.  No acute cardiopulmonary abnormality. Electronically Signed   By: Marlan Palau M.D.   On: 06/26/2018 14:16   Dg Elbow Complete Right  Result Date: 06/26/2018 CLINICAL DATA:  Fall.  Elbow pain EXAM: RIGHT ELBOW - COMPLETE 3+ VIEW COMPARISON:  09/26/2015 FINDINGS: Negative for fracture or dislocation. No significant degenerative change or effusion. Osteopenia.  Arterial calcification. IMPRESSION: Negative for fracture. Electronically Signed   By: Marlan Palau M.D.   On: 06/26/2018 14:18    Pending Labs Unresulted Labs (From admission, onward)    Start     Ordered   07/03/18 0500  Creatinine, serum  (enoxaparin (LOVENOX)    CrCl >/= 30 ml/min)  Weekly,   R    Comments:  while on enoxaparin therapy    06/26/18 1555   06/26/18 1545  HIV antibody (Routine Testing)  Once,   R     06/26/18 1555   06/26/18 1545  CBC  (enoxaparin (LOVENOX)    CrCl >/= 30 ml/min)  Once,   R    Comments:  Baseline for enoxaparin therapy IF NOT ALREADY DRAWN.  Notify MD if PLT < 100 K.    06/26/18 1555   06/26/18 1545  Creatinine, serum  (enoxaparin (LOVENOX)    CrCl >/= 30 ml/min)  Once,   R    Comments:  Baseline for enoxaparin therapy IF NOT ALREADY DRAWN.    06/26/18 1555   06/26/18 1245  Culture, blood (Routine x 2)  BLOOD CULTURE X 2,   STAT     06/26/18 1245   06/26/18 1245  Urinalysis, Routine w reflex microscopic  ONCE - STAT,   STAT     06/26/18  1245          Vitals/Pain Today's Vitals   06/26/18 1528 06/26/18 1529 06/26/18 1545 06/26/18 1600  BP:   (!) 92/59 (!) 94/59  Pulse:   (!) 104 (!) 108  Resp: 19  13 15   Temp:      TempSrc:      SpO2: 100%  100% 100%  Weight:      Height:      PainSc:  2       Isolation Precautions No active isolations  Medications Medications  LORazepam (ATIVAN) tablet 1 mg (has no administration in time range)    Or  LORazepam (ATIVAN) injection 1 mg (has no administration in time range)  thiamine (VITAMIN B-1) tablet 100 mg (has no administration in time range)    Or  thiamine (B-1) injection 100 mg (has no administration in time range)  folic acid (FOLVITE) tablet 1 mg (has no administration in time range)  multivitamin with minerals tablet 1 tablet (has no administration in time range)  LORazepam (ATIVAN) tablet 0-4 mg (has no administration in time range)    Followed by  LORazepam (ATIVAN) tablet 0-4 mg (has no administration in time range)  enoxaparin (LOVENOX) injection 40 mg (has no administration in time range)  sodium chloride flush (NS) 0.9 % injection 3 mL (has no administration in time range)  doxycycline (VIBRA-TABS) tablet 100 mg (has no administration in time range)  predniSONE (DELTASONE) tablet 40 mg (has no administration in time range)  ipratropium-albuterol (DUONEB) 0.5-2.5 (3) MG/3ML nebulizer solution  3 mL (3 mLs Nebulization Given 06/26/18 1351)  Tdap (BOOSTRIX) injection 0.5 mL (0.5 mLs Intramuscular Given 06/26/18 1351)  ipratropium-albuterol (DUONEB) 0.5-2.5 (3) MG/3ML nebulizer solution 3 mL (3 mLs Nebulization Given 06/26/18 1501)  methylPREDNISolone sodium succinate (SOLU-MEDROL) 125 mg/2 mL injection 125 mg (125 mg Intravenous Given 06/26/18 1503)  sodium chloride 0.9 % bolus 500 mL (500 mLs Intravenous New Bag/Given 06/26/18 1503)    Mobility walks Moderate fall risk   Focused Assessments Pulmonary Assessment Handoff:  Lung sounds: Bilateral Breath  Sounds: Diminished L Breath Sounds: Diminished R Breath Sounds: Diminished O2 Device: NRB        R Recommendations: See Admitting Provider Note  Report given to:   Additional Notes:

## 2018-06-26 NOTE — ED Provider Notes (Signed)
[facility] EMERGENCY DEPARTMENT Provider Note   CSN: [id number] Arrival date & time: 06/26/18  1217    History   Chief Complaint Chief Complaint  Patient presents with  . Shortness of Breath    HPI Caleb Carpenter is a 63 y.o. male with a PMHx dementia, HTN, COPD, CHF with EF 20-25%.      Pt presents to the ED via [facility] s/p ground level fall that occurred earlier today. When [facility] arrived on scene pt's friend reported that he had not been eating or drinking for the past couple of days. Pt denies hitting head or LOC. Is currently complaining of pain to his elbow wear he has skin tears from the fall. He also complains of shortness of breath and mild abdominal pain; unable to state if his shortness of breath is his baseline or worse than normal. Denies fevers, nausea, vomiting, chest pain.    Shortness of Breath  Associated symptoms: abdominal pain     Past Medical History:  Diagnosis Date  . Asthma   . COPD (chronic obstructive pulmonary disease) (HCC)   . Hypertension   . 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 NSTEMI (non-ST elevated myocardial infarction) [facility])     Patient Active Problem List   Diagnosis Date Noted  . Acute exacerbation of chronic obstructive pulmonary disease (COPD) (HCC) 06/26/2018  . Insomnia 12/15/2017  . Chewing tobacco use 12/15/2017  . 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 COPD with asthma (HCC) 11/22/2017  . Dyslipidemia 11/22/2017  . Evaluation by psychiatric service required   . 3-vessel coronary artery disease 11/03/2017  . DNR (do not resuscitate) 11/03/2017  . Ischemic cardiomyopathy 11/03/2017  . Protein-calorie malnutrition, severe 10/30/2017  . Palliative care encounter   . STEMI (ST elevation myocardial infarction) (HCC) 10/28/2017  . Syncope 04/01/2017  . Dementia (HCC) 11/06/2016  . Chronic systolic heart  failure (HCC) 29/51/8841  . General weakness 09/03/2015  . HTN (hypertension) 09/02/2015  . Alcohol abuse 06/23/2015  . Gastritis     Past Surgical History:  Procedure Laterality Date  . CATARACT EXTRACTION, BILATERAL Bilateral   . ESOPHAGOGASTRODUODENOSCOPY (EGD) WITH PROPOFOL N/A 06/21/2015   Procedure: ESOPHAGOGASTRODUODENOSCOPY (EGD) WITH PROPOFOL;  Surgeon: Midge Minium, MD;  Location: [facility] ENDOSCOPY;  Service: Endoscopy;  Laterality: N/A;  . LEFT HEART CATH AND CORONARY ANGIOGRAPHY N/A 10/28/2017   Procedure: LEFT HEART CATH AND CORONARY ANGIOGRAPHY;  Surgeon: Swaziland, Peter M, MD;  Location: [facility] INVASIVE CV LAB;  Service: Cardiovascular;  Laterality: N/A;        Home Medications    Prior to Admission medications   Medication Sig Start Date End Date Taking? Authorizing Provider  acetaminophen (TYLENOL) 325 MG tablet Take 2 tablets (650 mg total) by mouth every 4 (four) hours as needed for headache or mild pain. 11/03/17   Abelino Derrick, PA-C  albuterol (PROVENTIL HFA;VENTOLIN HFA) 108 (90 Base) MCG/ACT inhaler Inhale 2 puffs into the lungs every 4 (four) hours as needed for wheezing.     [provider]  aspirin 81 MG chewable tablet Chew 1 tablet (81 mg total) by mouth daily. 10/13/16   Adrian Saran, MD  atorvastatin (LIPITOR) 40 MG tablet Take 1 tablet (40 mg total) by mouth daily. 11/10/17 11/10/18  Kroeger, Ovidio Kin., PA-C  clopidogrel (PLAVIX) 75 MG tablet Take 1 tablet (75 mg total) by mouth daily with breakfast. 11/10/17   Kroeger, Ovidio Kin., PA-C  ENSURE PLUS (ENSURE PLUS) LIQD Take 237 mLs by mouth 3 (three) times daily. 11/13/17   [provider]  fluticasone (FLONASE) 50 MCG/ACT nasal spray Place 2 sprays into both nostrils daily.    [provider]  folic acid (FOLVITE) 1 MG tablet Take 1 tablet (1 mg total) by mouth daily. 11/10/17   Kroeger, Ovidio Kin., PA-C  losartan (COZAAR) 25 MG tablet Take 1 tablet (25 mg total) by mouth daily. 11/11/17   Kroeger,  Ovidio Kin., PA-C  metoprolol succinate (TOPROL XL) 50 MG 24 hr tablet Take 1 tablet (50 mg total) by mouth daily. Take with or immediately following a meal. 11/10/17 11/10/18  Kroeger, Ovidio Kin., PA-C  Multiple Vitamin (MULTIVITAMIN WITH MINERALS) TABS tablet Take 1 tablet by mouth daily.    [provider]  nitroGLYCERIN (NITROSTAT) 0.4 MG SL tablet Place 1 tablet (0.4 mg total) under the tongue every 5 (five) minutes as needed for chest pain. 11/10/17   Kroeger, Ovidio Kin., PA-C  Nutritional Supplements (NUTRITIONAL SUPPLEMENT PO) Regular Diet - Regular / Thin consistency    [provider]  ondansetron (ZOFRAN) 4 MG tablet Take 4 mg by mouth every 6 (six) hours as needed for nausea or vomiting. 11/15/17   [provider]  thiamine 100 MG tablet Take 100 mg by mouth daily.    [provider]  Tiotropium Bromide-Olodaterol (STIOLTO RESPIMAT) 2.5-2.5 MCG/ACT AERS Inhale 2 puffs into the lungs daily.    [provider]  traZODone (DESYREL) 50 MG tablet Take 50 mg by mouth at bedtime.    [provider]    Family History Family History  Problem Relation Age of Onset  . Diabetes Other   . Hypertension Other     Social History Social History   Tobacco Use  . Smoking status: Former Smoker    Types: Cigarettes    Last attempt to quit: 06/21/2014    Years since quitting: 4.0  . Smokeless tobacco: Current User    Types: Chew  Substance Use Topics  . Alcohol use: Yes    Alcohol/week: 2.0 standard drinks    Types: 2 Cans of beer per week    Comment: per day  . Drug use: No     Allergies   Codeine   Review of Systems Review of Systems  Unable to perform ROS: Dementia  Respiratory: Positive for shortness of breath.   Gastrointestinal: Positive for abdominal pain.  Skin: Positive for wound.     Physical Exam Updated Vital Signs BP 93/71   Pulse (!) 108   Temp 97.9 F (36.6 C) (Rectal)   Resp 19   Ht 5\' 5"  (1.651 m)   Wt 51 kg    SpO2 100%   BMI 18.71 kg/m   Physical Exam Vitals signs and nursing note reviewed.  Constitutional:      Comments: Frail, cachectic   HENT:     Head: Normocephalic and atraumatic.  Eyes:     Conjunctiva/sclera: Conjunctivae normal.  Neck:     Musculoskeletal: Normal range of motion and neck supple.  Cardiovascular:     Rate and Rhythm: Normal rate and regular rhythm.     Pulses: Normal pulses.  Pulmonary:     Effort: Pulmonary effort is normal.     Breath sounds: Examination of the right-lower field reveals rales. Examination of the left-lower field reveals rales. Rales present. No wheezing or rhonchi.  Abdominal:     Palpations: Abdomen is soft.     Tenderness:  There is no abdominal tenderness.  Musculoskeletal:     Comments: 4 x 2 cm skin avulsion to right elbow with small avulsion posteriorly; wound dressed with medicated gauze and wrapped  Skin:    General: Skin is warm and dry.  Neurological:     Mental Status: He is alert.  Psychiatric:        Mood and Affect: Mood normal.      ED Treatments / Results  Labs (all labs ordered are listed, but only abnormal results are displayed) Labs Reviewed  BASIC METABOLIC PANEL - Abnormal; Notable for the following components:      Result Value   Sodium 130 (*)    CO2 20 (*)    BUN <5 (*)    Calcium 8.5 (*)    All other components within normal limits  CBC - Abnormal; Notable for the following components:   RBC 3.84 (*)    Hemoglobin 10.6 (*)    HCT 35.6 (*)    MCHC 29.8 (*)    RDW 18.1 (*)    All other components within normal limits  LACTIC ACID, PLASMA - Abnormal; Notable for the following components:   Lactic Acid, Venous 3.9 (*)    All other components within normal limits  LACTIC ACID, PLASMA - Abnormal; Notable for the following components:   Lactic Acid, Venous 3.4 (*)    All other components within normal limits  CULTURE, BLOOD (ROUTINE X 2)  CULTURE, BLOOD (ROUTINE X 2)  PROTIME-INR  BRAIN NATRIURETIC  PEPTIDE  URINALYSIS, ROUTINE W REFLEX MICROSCOPIC  HIV ANTIBODY (ROUTINE TESTING W REFLEX)  CBC  CREATININE, SERUM  I-STAT TROPONIN, ED    EKG EKG Interpretation  Date/Time:  Friday June 26 2018 12:29:50 EDT Ventricular Rate:  93 PR Interval:    QRS Duration: 101 QT Interval:  357 QTC Calculation: 444 R Axis:   77 Text Interpretation:  Sinus rhythm No significant change since last tracing Confirmed by Cathren Laine ([id number]) on 06/26/2018 12:43:00 PM   Radiology Dg Chest 2 View  Result Date: 06/26/2018 CLINICAL DATA:  Short of breath COPD EXAM: CHEST - 2 VIEW COMPARISON:  04/07/2018 FINDINGS: COPD with pulmonary hyperinflation and emphysema. Heart size normal. Negative for pneumonia. Negative for heart failure or effusion. Chronic rib fractures bilaterally. Chronic fracture approximately T11 or T12. 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 IMPRESSION: COPD.  No acute cardiopulmonary abnormality. Electronically Signed   By: Marlan Palau M.D.   On: 06/26/2018 14:16   Dg Elbow Complete Right  Result Date: 06/26/2018 CLINICAL DATA:  Fall.  Elbow pain EXAM: RIGHT ELBOW - COMPLETE 3+ VIEW COMPARISON:  09/26/2015 FINDINGS: Negative for fracture or dislocation. No significant degenerative change or effusion. Osteopenia.  Arterial calcification. IMPRESSION: Negative for fracture. Electronically Signed   By: Marlan Palau M.D.   On: 06/26/2018 14:18    Procedures Procedures (including critical care time)  Medications Ordered in ED Medications  LORazepam (ATIVAN) tablet 1 mg (has no administration in time range)    Or  LORazepam (ATIVAN) injection 1 mg (has no administration in time range)  thiamine (VITAMIN B-1) tablet 100 mg (has no administration in time range)    Or  thiamine (B-1) injection 100 mg (has no administration in time range)  folic acid (FOLVITE) tablet 1 mg (has no administration in time range)  multivitamin with minerals  tablet 1 tablet (has no administration in time range)  LORazepam (ATIVAN) tablet 0-4 mg (has no administration in  time range)    Followed by  LORazepam (ATIVAN) tablet 0-4 mg (has no administration in time range)  enoxaparin (LOVENOX) injection 40 mg (has no administration in time range)  sodium chloride flush (NS) 0.9 % injection 3 mL (has no administration in time range)  doxycycline (VIBRA-TABS) tablet 100 mg (has no administration in time range)  predniSONE (DELTASONE) tablet 40 mg (has no administration in time range)  ipratropium-albuterol (DUONEB) 0.5-2.5 (3) MG/3ML nebulizer solution 3 mL (3 mLs Nebulization Given 06/26/18 1351)  Tdap (BOOSTRIX) injection 0.5 mL (0.5 mLs Intramuscular Given 06/26/18 1351)  ipratropium-albuterol (DUONEB) 0.5-2.5 (3) MG/3ML nebulizer solution 3 mL (3 mLs Nebulization Given 06/26/18 1501)  methylPREDNISolone sodium succinate (SOLU-MEDROL) 125 mg/2 mL injection 125 mg (125 mg Intravenous Given 06/26/18 1503)  sodium chloride 0.9 % bolus 500 mL (500 mLs Intravenous New Bag/Given 06/26/18 1503)     Initial Impression / Assessment and Plan / ED Course  I have reviewed the triage vital signs and the nursing notes.  Pertinent labs & imaging results that were available during my care of the patient were reviewed by me and considered in my medical decision making (see chart for details).  Clinical Course as of Jun 25 1556  Fri Jun 26, 2018  1255 Temp: 97.9 F (36.6 C) [MV]    Clinical Course User Index [MV] Tanda Rockers, PA-C   Presents via ambulance for ?fall. EMS reports that they were called out for a fall; skin tears to right elbow; when they arrived pt's friend reported he had not been eating or drinking for the past couple of days. O2 sats in the low 80's on room air. Initially started on [place] with no improvement; switched to non rebreather and increased to high 80's. Pt currently complaining of shortness of breath and abdominal pain. Reports he fell  today because his legs gave out on him. Pt poor historian and unable to illicit much more information out of him. Crackles to bilateral lower lung fields; will get CXR. Pt with hx of CHF; last EF 20-25% in July 2019; BNP ordered as well. Baseline labs as well as sepsis workup initiated; code sepsis not called at this point as pt does not meet criteria with vital signs and no suspected source of infection currently.   2:25 PM CBC with anemia; improved from previous. No leukocytosis. Mild hyponatremia likely due to reported hx of not eating or drinking for the past couple of days. Elevated LA of 3.9. CXR negative besides COPD findings and DG R elbow without fracture. Reevaluated pt; currently satting 100% on 4L [place]; nursing staff states that pt's sats decreased when he is up and moving. Will discuss case with attending physician Dr. Denton Lank; unsure why LA is elevated; still awaiting U/A.   2:47 PM Discussed case with Dr. Denton Lank who suggested steroids and fluids; likely needs admission given presentation and elevated LA.   3:25 PM Jodi Geralds, PA-C discussed case with Dr. Willette Pa who will admit patient.       Final Clinical Impressions(s) / ED Diagnoses   Final diagnoses:  Acute respiratory failure with hypoxia [facility])  COPD exacerbation [facility])  Dehydration    ED Discharge Orders    None       Tanda Rockers, PA-C 06/26/18 1558    Cathren Laine, MD 06/26/18 1630

## 2018-06-27 DIAGNOSIS — J441 Chronic obstructive pulmonary disease with (acute) exacerbation: Principal | ICD-10-CM

## 2018-06-27 DIAGNOSIS — I5022 Chronic systolic (congestive) heart failure: Secondary | ICD-10-CM

## 2018-06-27 DIAGNOSIS — Z515 Encounter for palliative care: Secondary | ICD-10-CM

## 2018-06-27 DIAGNOSIS — I2510 Atherosclerotic heart disease of native coronary artery without angina pectoris: Secondary | ICD-10-CM

## 2018-06-27 DIAGNOSIS — Z7189 Other specified counseling: Secondary | ICD-10-CM

## 2018-06-27 DIAGNOSIS — F1010 Alcohol abuse, uncomplicated: Secondary | ICD-10-CM

## 2018-06-27 DIAGNOSIS — I255 Ischemic cardiomyopathy: Secondary | ICD-10-CM

## 2018-06-27 LAB — HIV ANTIBODY (ROUTINE TESTING W REFLEX): HIV Screen 4th Generation wRfx: NONREACTIVE

## 2018-06-27 MED ORDER — METOPROLOL SUCCINATE ER 50 MG PO TB24
50.0000 mg | ORAL_TABLET | Freq: Every day | ORAL | Status: DC
  Administered 2018-06-27 – 2018-06-28 (×2): 50 mg via ORAL
  Filled 2018-06-27 (×2): qty 1

## 2018-06-27 MED ORDER — IPRATROPIUM-ALBUTEROL 0.5-2.5 (3) MG/3ML IN SOLN: 3.0000 mL | RESPIRATORY_TRACT | Status: DC | PRN

## 2018-06-27 MED ORDER — ENSURE ENLIVE PO LIQD
237.0000 mL | Freq: Three times a day (TID) | ORAL | Status: DC
  Administered 2018-06-27 – 2018-06-28 (×3): 237 mL via ORAL

## 2018-06-27 NOTE — Progress Notes (Signed)
HR goes up with activity, sometimes up to 170 bpm. Notified MD Arrien.  Evia Goldsmith, RN

## 2018-06-27 NOTE — Progress Notes (Signed)
PROGRESS NOTE    Caleb Carpenter  XYI:[id number] DOB: 16-Sep-1955 DOA: 06/26/2018 PCP: Evelene Croon, MD    Brief Narrative:  63 year old male who presented with dyspnea.  He does have significant past medical history for alcohol abuse, dementia, hypertension, COPD, systolic heart failure ejection fraction 20 to 25%.  Apparently he fell from his own height the day before admission.  Apparently patient has been not eating or drinking well for the last 2 days.  No apparent head trauma.  On his initial physical examination his oximetry was 80% on room air, that corrected to 98% on 4 L of supplemental oxygen per nasal cannula, he had moist mucous membranes, his lungs had positive bilateral wheezing, heart S1-S2 present and rhythmic, the abdomen was soft, no lower extremity edema.  He was ill looking appearing and cachectic.  Sodium 130, potassium 4.5, chloride 98, bicarb 20, glucose 81, BUN less than 5, creatinine 1.72, BNP 92.8, venous lactic acid 3.9, white count 4.8, hemo-10.6, hematocrit 35.6, platelets 161.  His urinalysis was negative for infection.  His chest x-ray had hyperinflation, chronic rib fractures bilaterally, cranial fracture T11/T12.  His EKG was low voltage, baseline artifact, sinus, rate of 120 bpm, normal axis, normal intervals, poor R wave progression, no significant ST segment changes, negative T waves in V4 V5.  Patient was admitted to the hospital working diagnosis of COPD exacerbation with acute hypoxic respiratory failure.    Assessment & Plan:   Principal Problem:   COPD with asthma (HCC) Active Problems:   Alcohol abuse   Chronic systolic heart failure (HCC)   Goals of care, counseling/discussion   Protein-calorie malnutrition, severe   3-vessel coronary artery disease   Ischemic cardiomyopathy   Acute exacerbation of chronic obstructive pulmonary disease (COPD) (HCC)   Palliative care by specialist   1. Acute COPD exacerbation with acute hypoxic respiratory  failure. Continue bronchodilator therapy, systemic steroids and oxymetry monitoring. This am oxygenation on room air is 100%.  2. Reactive tachycardia. Patient with sinus tachycardia up to 170 on exertion, likely reactive, patient with severe deconditioning. Will resume home dose of metoprolol, continue telemetry monitoring.   3. Alcohol abuse. No active signs of withdrawal, will continue as needed lorazepam per CIWA protocol, will continue neuro checks per unit protocol and thiamine.   4. Systolic heart failure. Ischemic cardiomyopathy, compensated today, will resume b blockade, will continue blood pressure monitoring.   5. Protein calorie malnutrition. Continue nutritional supplements.    DVT prophylaxis: enoxaparin   Code Status: dnr  Family Communication: no family at the bedside  Disposition Plan/ discharge barriers:  Possible this in am, home. Patient has daily nursing supervision at home.   Body mass index is 15.74 kg/m. Malnutrition Type:  Nutrition Problem: Severe Malnutrition Etiology: chronic illness(COPD, CHF)   Malnutrition Characteristics:  Signs/Symptoms: severe fat depletion, severe muscle depletion, percent weight loss(11% weight loss within 6 months) Percent weight loss: 11 %   Nutrition Interventions:  Interventions: Ensure Enlive (each supplement provides 350kcal and 20 grams of protein)  RN Pressure Injury Documentation:   Consultants:   Palliative Care  Procedures:     Antimicrobials:       Subjective: Patient is feeling better, but not yet back to baseline, very weak and deconditioned. Per telemetry his HR can be as high as 170 bpm on ambulation.   Objective: Vitals:   06/27/18 0648 06/27/18 0737 06/27/18 0905 06/27/18 1204  BP: 135/75 126/79 121/79 125/81  Pulse: 89 100 100 87  Resp: 16  18 20 18   Temp: 98.1 F (36.7 C)   98 F (36.7 C)  TempSrc: Oral   Oral  SpO2: 98% 99% 99% 100%  Weight:      Height:        Intake/Output  Summary (Last 24 hours) at 06/27/2018 1345 Last data filed at 06/27/2018 1340 Gross per 24 hour  Intake 980 ml  Output 600 ml  Net 380 ml   Filed Weights   06/26/18 1306 06/27/18 0250  Weight: 51 kg 42.9 kg    Examination:   General: deconditioned and ill looking appearing  Neurology: Awake and alert, non focal  E ENT: mild pallor, no icterus, oral mucosa moist Cardiovascular: No JVD. S1-S2 present, rhythmic, no gallops, rubs, or murmurs. No lower extremity edema. Pulmonary: decreased breath sounds bilaterally, poor air movement, no wheezing, or rhonchi scattered rales. Gastrointestinal. Abdomen with no organomegaly, non tender, no rebound or guarding Skin. No rashes Musculoskeletal: no joint deformities     Data Reviewed: I have personally reviewed following labs and imaging studies  CBC: Recent Labs  Lab 06/21/18 1936 06/26/18 1247  WBC 4.4 4.8  HGB 9.6* 10.6*  HCT 31.5* 35.6*  MCV 93.8 92.7  PLT 145* 161   Basic Metabolic Panel: Recent Labs  Lab 06/21/18 1936 06/26/18 1247  NA 134* 130*  K 4.3 4.5  CL 104 98  CO2 20* 20*  GLUCOSE 111* 81  BUN 5* <5*  CREATININE 1.02 0.72  CALCIUM 8.1* 8.5*   GFR: Estimated Creatinine Clearance: 56.6 mL/min (by C-G formula based on SCr of 0.72 mg/dL). Liver Function Tests: Recent Labs  Lab 06/21/18 1936  AST 41  ALT 13  ALKPHOS 88  BILITOT 0.5  PROT 6.0*  ALBUMIN 2.6*   No results for input(s): LIPASE, AMYLASE in the last 168 hours. No results for input(s): AMMONIA in the last 168 hours. Coagulation Profile: Recent Labs  Lab 06/26/18 1247  INR 1.0   Cardiac Enzymes: No results for input(s): CKTOTAL, CKMB, CKMBINDEX, TROPONINI in the last 168 hours. BNP (last 3 results) No results for input(s): PROBNP in the last 8760 hours. HbA1C: No results for input(s): HGBA1C in the last 72 hours. CBG: No results for input(s): GLUCAP in the last 168 hours. Lipid Profile: No results for input(s): CHOL, HDL, LDLCALC,  TRIG, CHOLHDL, LDLDIRECT in the last 72 hours. Thyroid Function Tests: No results for input(s): TSH, T4TOTAL, FREET4, T3FREE, THYROIDAB in the last 72 hours. Anemia Panel: No results for input(s): VITAMINB12, FOLATE, FERRITIN, TIBC, IRON, RETICCTPCT in the last 72 hours.    Radiology Studies: I have reviewed all of the imaging during this hospital visit personally     Scheduled Meds: . doxycycline  100 mg Oral Q12H  . enoxaparin (LOVENOX) injection  40 mg Subcutaneous Q24H  . feeding supplement (ENSURE ENLIVE)  237 mL Oral TID BM  . folic acid  1 mg Oral Daily  . LORazepam  0-4 mg Oral Q6H   Followed by  . [START ON 06/28/2018] LORazepam  0-4 mg Oral Q12H  . multivitamin with minerals  1 tablet Oral Daily  . predniSONE  40 mg Oral Q breakfast  . sodium chloride flush  3 mL Intravenous Q12H  . thiamine  100 mg Oral Daily   Or  . thiamine  100 mg Intravenous Daily   Continuous Infusions:   LOS: 1 day        Caleb Annett Gula, MD

## 2018-06-27 NOTE — Progress Notes (Signed)
Patient with blanchable redness to the bottom. Placed foam and encouraged frequent turning. Skin tear re-dressed as well.

## 2018-06-27 NOTE — Evaluation (Signed)
Physical Therapy Evaluation Patient Details Name: Caleb Carpenter MRN: [id number] DOB: 12-18-1955 Today's Date: 06/27/2018   History of Present Illness   63 y.o. male with medical history significant of alcohol related dementia, hypertension, COPD, systolic congestive heart failure with an EF of 20 to 25% presents to the emergency department via EMS status post a ground-level fall and noted acute exacerbation of COPD.  Clinical Impression  Orders received for PT evaluation. Patient demonstrates deficits in functional mobility as indicated below. Will benefit from continued skilled PT to address deficits and maximize function. Will see as indicated and progress as tolerated.  At this time, patient at increased risk for falls and poor ability for self care. Highly recommend SNF upon acute discharge.  OF NOTE: HR elevations during session upper 160s, however patient asymptomatic and appears in conjunction with tremulous activities.     Follow Up Recommendations SNF;Supervision/Assistance - 24 hour    Equipment Recommendations  (TBD)    Recommendations for Other Services       Precautions / Restrictions Precautions Precautions: Fall      Mobility  Bed Mobility Overal bed mobility: Needs Assistance Bed Mobility: Supine to Sit;Sit to Supine     Supine to sit: Min guard Sit to supine: Min guard   General bed mobility comments: min guard for safety  Transfers Overall transfer level: Needs assistance Equipment used: 1 person hand held assist Transfers: Sit to/from Stand Sit to Stand: Min assist         General transfer comment: min assist for safety and stability  Ambulation/Gait Ambulation/Gait assistance: Min assist Gait Distance (Feet): 12 Feet Assistive device: 1 person hand held assist Gait Pattern/deviations: Step-to pattern;Decreased stride length;Trunk flexed;Shuffle Gait velocity: decreased Gait velocity interpretation: <1.31 ft/sec, indicative of household  ambulator General Gait Details: patient relaint on physical assist, flexed posture. Assist for safety and stability  Stairs            Wheelchair Mobility    Modified Rankin (Stroke Patients Only)       Balance Overall balance assessment: History of Falls                                           Pertinent Vitals/Pain Pain Assessment: Faces Faces Pain Scale: Hurts even more Pain Location: left leg and IV sites Pain Descriptors / Indicators: Sore;Discomfort Pain Intervention(s): Monitored during session    Home Living Family/patient expects to be discharged to:: Private residence Living Arrangements: Non-relatives/Friends Available Help at Discharge: Friend(s);Available 24 hours/day Type of Home: House Home Access: Ramped entrance     Home Layout: One level Home Equipment: Walker - 2 wheels Additional Comments: Pt reports he lives with a friend named Alicia Amel, who is the brother of his sister's husband.   [name] works 3 days/wk, and provides transportation to MD appointements     Prior Function Level of Independence: Needs assistance   Gait / Transfers Assistance Needed: reports independence with ambulation  ADL's / Homemaking Assistance Needed: friend helps with cooking and house care tasks but patient reports he does his own bathing and dressing  Comments: Some questionable history, unclear if patient is truly a reliable historian     Hand Dominance   Dominant Hand: Right    Extremity/Trunk Assessment   Upper Extremity Assessment Upper Extremity Assessment: Generalized weakness    Lower Extremity Assessment Lower Extremity Assessment: Generalized weakness  Cervical / Trunk Assessment Cervical / Trunk Assessment: Kyphotic  Communication      Cognition Arousal/Alertness: Awake/alert Behavior During Therapy: WFL for tasks assessed/performed Overall Cognitive Status: No family/caregiver present to determine baseline cognitive  functioning                                        General Comments      Exercises     Assessment/Plan    PT Assessment Patient needs continued PT services  PT Problem List Decreased strength;Decreased activity tolerance;Decreased balance;Decreased mobility;Decreased coordination;Decreased safety awareness;Pain       PT Treatment Interventions DME instruction;Gait training;Stair training;Functional mobility training;Therapeutic activities;Therapeutic exercise;Balance training;Neuromuscular re-education;Patient/family education    PT Goals (Current goals can be found in the Care Plan section)  Acute Rehab PT Goals Patient Stated Goal: to go home PT Goal Formulation: With patient Time For Goal Achievement: 07/11/18 Potential to Achieve Goals: Good    Frequency Min 2X/week   Barriers to discharge Decreased caregiver support      Co-evaluation               AM-PAC PT "6 Clicks" Mobility  Outcome Measure Help needed turning from your back to your side while in a flat bed without using bedrails?: A Little Help needed moving from lying on your back to sitting on the side of a flat bed without using bedrails?: A Little Help needed moving to and from a bed to a chair (including a wheelchair)?: A Little Help needed standing up from a chair using your arms (e.g., wheelchair or bedside chair)?: A Little Help needed to walk in hospital room?: A Little Help needed climbing 3-5 steps with a railing? : A Lot 6 Click Score: 17    End of Session Equipment Utilized During Treatment: Oxygen Activity Tolerance: Patient limited by fatigue Patient left: in bed;with call bell/phone within reach;with bed alarm set Nurse Communication: Mobility status PT Visit Diagnosis: Unsteadiness on feet (R26.81);Muscle weakness (generalized) (M62.81);History of falling (Z91.81)    Time: 6045-4098 PT Time Calculation (min) (ACUTE ONLY): 20 min   Charges:   PT Evaluation $PT  Eval Moderate Complexity: 1 Mod          Charlotte Crumb, PT DPT  Board Certified Neurologic Specialist Acute Rehabilitation Services Pager [phone] Office [phone]   Fabio Asa 06/27/2018, 8:38 AM

## 2018-06-27 NOTE — Progress Notes (Signed)
Pt placed on low bed, floor mat in place, bed alarm on.

## 2018-06-27 NOTE — Progress Notes (Signed)
Initial Nutrition Assessment  DOCUMENTATION CODES:   Severe malnutrition in context of chronic illness, Underweight  INTERVENTION:    Ensure Enlive po TID, each supplement provides 350 kcal and 20 grams of protein  Continue MVI daily  NUTRITION DIAGNOSIS:   Severe Malnutrition related to chronic illness(COPD, CHF) as evidenced by severe fat depletion, severe muscle depletion, percent weight loss(11% weight loss within 6 months).  GOAL:   Patient will meet greater than or equal to 90% of their needs  MONITOR:   PO intake, Supplement acceptance, Skin, I & O's  REASON FOR ASSESSMENT:   Consult COPD Protocol  ASSESSMENT:   63 yo male with PMH of alcohol related dementia, HTN, asthma, COPD, CHF, NSTEMI who was admitted s/p fall at home with acute COPD exacerbation.   Patient reports poor intake at home. He eats snacks, but does not really eat meals. He likes potato chips, instant potatoes. He also likes chocolate Ensure. He was unable to provide much nutrition hx.   Labs reviewed. Sodium 130 (L), BUN <5 (L) Medications reviewed and include folic acid, MVI, prednisone, thiamine.  Per review of weight encounters, patient has lost at least 11% of usual weight within the past 6 months.  NUTRITION - FOCUSED PHYSICAL EXAM:    Most Recent Value  Orbital Region  Moderate depletion  Upper Arm Region  Severe depletion  Thoracic and Lumbar Region  Severe depletion  Buccal Region  Unable to assess  Temple Region  Severe depletion  Clavicle Bone Region  Severe depletion  Clavicle and Acromion Bone Region  Severe depletion  Scapular Bone Region  Severe depletion  Dorsal Hand  Severe depletion  Patellar Region  Severe depletion  Anterior Thigh Region  Severe depletion  Posterior Calf Region  Severe depletion  Edema (RD Assessment)  None  Hair  Reviewed  Eyes  Reviewed  Mouth  Reviewed  Skin  Reviewed  Nails  Reviewed       Diet Order:   Diet Order            Diet  regular Room service appropriate? Yes; Fluid consistency: Thin; Fluid restriction: 1200 mL Fluid  Diet effective now              EDUCATION NEEDS:   No education needs have been identified at this time  Skin:  Skin Assessment: Reviewed RN Assessment  Last BM:  3/13  Height:   Ht Readings from Last 1 Encounters:  06/26/18 5\' 5"  (1.651 m)    Weight:   Wt Readings from Last 1 Encounters:  06/27/18 42.9 kg    Ideal Body Weight:  61.8 kg  BMI:  Body mass index is 15.74 kg/m.  Estimated Nutritional Needs:   Kcal:  1600-1800  Protein:  70-80 gm  Fluid:  1.6 L    Joaquin Courts, RD, LDN, CNSC Pager [phone] After Hours Pager [phone]

## 2018-06-27 NOTE — Consult Note (Signed)
Consultation Note Date: 06/27/2018   Patient Name: Caleb Carpenter  DOB: 1955/09/26  MRN: [id number]  Age / Sex: 63 y.o., male  PCP: Evelene Croon, MD Referring Physician: Coralie Keens  Reason for Consultation: Establishing goals of care, Hospice Evaluation and Psychosocial/spiritual support  HPI/Patient Profile: 63 y.o. male  with past medical history of ischemic cardiomyopathy, systolic heart failure with EF 20 to 25%, alcohol use disorder (reports using approximately 24 ounces of beer a day), dementia, history of non-STEMI 10/2017 admitted on 06/26/2018 after a ground-level fall at home.  He was complaining of pain to his elbow.  Upon presentation to the ED, imaging revealed no fracture to his elbow.  Chest x-ray revealed pulmonary hyperinflation consistent with emphysema, no pneumonia, chronic rib fractures.  Consult ordered for goals of care and hospice evaluation.  Patient has been seen by the palliative medicine team in July 2019 when he was admitted with chest pain per chart review, after that admission he appears he that he went to a skilled nursing facility for rehab to return to the home..   Clinical Assessment and Goals of Care: Patient seen, chart reviewed.  Per my coworkers note dated July 17 through November 04, 2017 patient has no wife or children.  He lives with his brother-in-law's, brother, Bethann Berkshire.  Confirmed that patient is a DNR.  I also spoke to [facility] on the phone this morning.  He does verify that Brevon can come back to the house.  He also shares that Mauel has recently become affiliated with [facility].  Nikolai receives services through [facility] for nursing visits and someone to stay in the home with him on Monday Wednesday and Friday from 9-6.  Per Bethann Berkshire, nursing visits were starting on 06/26/2018.  Patient fell when he got up to answer the door for home health nurse.   [name] now shares that home health nurse has a key to the house.  Bethann Berkshire verifies that Khizar continues to be very weak.  He walks a few steps in the house but mostly goes from bed to the bathroom.  Patient has limited healthcare decision-making ability.  He can answer simple yes and no questions.  His healthcare proxy is established during previous palliative medicine visits would be his sister, Abbey Chatters at [phone].  Per chart review, patient is unmarried and has no children does a sibling would be in line to make healthcare decisions   SUMMARY OF RECOMMENDATIONS   DNR/DNI Patient would qualify for his hospice in-home benefit but he is currently receiving home health visits which are supplying more support in the home than hospice typically is able to give.  He is receiving a home health nurse on Monday Wednesdays and Fridays from 9 to 6 PM on the days that his roommate works.  Per Bethann Berkshire, home health nurse educator the house so obstacles that have been encountered in the past where he does not answer the door will no longer be a problem Per his roommate Bethann Berkshire, he is welcome to  come back to the home with the support of primary home health Code Status/Advance Care Planning:  DNR    Palliative Prophylaxis:   Aspiration, Bowel Regimen, Delirium Protocol, Frequent Pain Assessment, Oral Care, Palliative Wound Care and Turn Reposition  Additional Recommendations (Limitations, Scope, Preferences):  Avoid Hospitalization, Minimize Medications, No Artificial Feeding, No Chemotherapy, No Hemodialysis, No Radiation, No Surgical Procedures and No Tracheostomy  Psycho-social/Spiritual:   Desire for further Chaplaincy support:no  Additional Recommendations: Referral to Community Resources   Prognosis:   < 6 months of advanced heart failure, EF 20 to 25%, end-stage COPD, and dementia.  He would qualify for his hospice benefit but currently is receiving increased services through [facility] home  health.  Discharge Planning: Home with Home Health      Primary Diagnoses: Present on Admission:  Alcohol abuse  Chronic systolic heart failure (HCC)  Ischemic cardiomyopathy  Protein-calorie malnutrition, severe  3-vessel coronary artery disease  COPD with asthma (HCC)  Acute exacerbation of chronic obstructive pulmonary disease (COPD) (HCC)   I have reviewed the medical record, interviewed the patient and family, and examined the patient. The following aspects are pertinent.  Past Medical History:  Diagnosis Date   Asthma    COPD (chronic obstructive pulmonary disease) (HCC)    Hypertension    Hypertensive heart disease with chronic systolic congestive heart failure (HCC) 11/22/2017   NSTEMI (non-ST elevated myocardial infarction) [facility])    Social History   Socioeconomic History   Marital status: Legally Separated    Spouse name: Not on file   Number of children: Not on file   Years of education: 6   Highest education level: 6th grade  Occupational History   Occupation: retired  Ecologist strain: Very hard   Food insecurity:    Worry: Often true    Inability: Often true   Transportation needs:    Medical: No    Non-medical: No  Tobacco Use   Smoking status: Former Smoker    Types: Cigarettes    Last attempt to quit: 06/21/2014    Years since quitting: 4.0   Smokeless tobacco: Current User    Types: Chew  Substance and Sexual Activity   Alcohol use: Yes    Alcohol/week: 2.0 standard drinks    Types: 2 Cans of beer per week    Comment: per day   Drug use: No   Sexual activity: Never  Lifestyle   Physical activity:    Days per week: Not on file    Minutes per session: Not on file   Stress: Not on file  Relationships   Social connections:    Talks on phone: Not on file    Gets together: Not on file    Attends religious service: Not on file    Active member of club or organization: Not on file     Attends meetings of clubs or organizations: Not on file    Relationship status: Not on file  Other Topics Concern   Not on file  Social History Narrative   Not on file   Family History  Problem Relation Age of Onset   Diabetes Other    Hypertension Other    Scheduled Meds:  doxycycline  100 mg Oral Q12H   enoxaparin (LOVENOX) injection  40 mg Subcutaneous Q24H   feeding supplement (ENSURE ENLIVE)  237 mL Oral TID BM   folic acid  1 mg Oral Daily   LORazepam  0-4 mg Oral Q6H  Followed by   Melene Muller ON 06/28/2018] LORazepam  0-4 mg Oral Q12H   multivitamin with minerals  1 tablet Oral Daily   predniSONE  40 mg Oral Q breakfast   sodium chloride flush  3 mL Intravenous Q12H   thiamine  100 mg Oral Daily   Or   thiamine  100 mg Intravenous Daily   Continuous Infusions: PRN Meds:.LORazepam **OR** LORazepam Medications Prior to Admission:  Prior to Admission medications   Medication Sig Start Date End Date Taking? Authorizing Provider  albuterol (PROVENTIL HFA;VENTOLIN HFA) 108 (90 Base) MCG/ACT inhaler Inhale 2 puffs into the lungs every 4 (four) hours as needed for wheezing.    Yes [provider]  metoprolol succinate (TOPROL XL) 50 MG 24 hr tablet Take 1 tablet (50 mg total) by mouth daily. Take with or immediately following a meal. 11/10/17 11/10/18 Yes Kroeger, Ovidio Kin., PA-C  pantoprazole (PROTONIX) 40 MG tablet Take 40 mg by mouth daily.   Yes [provider]  rosuvastatin (CRESTOR) 10 MG tablet Take 10 mg by mouth daily.   Yes [provider]  Tiotropium Bromide-Olodaterol (STIOLTO RESPIMAT) 2.5-2.5 MCG/ACT AERS Inhale 2 puffs into the lungs daily.   Yes [provider]  acetaminophen (TYLENOL) 325 MG tablet Take 2 tablets (650 mg total) by mouth every 4 (four) hours as needed for headache or mild pain. Patient not taking: Reported on 06/26/2018 11/03/17   Abelino Derrick, PA-C  aspirin 81 MG chewable tablet Chew 1 tablet (81 mg  total) by mouth daily. Patient not taking: Reported on 06/26/2018 10/13/16   Adrian Saran, MD  atorvastatin (LIPITOR) 40 MG tablet Take 1 tablet (40 mg total) by mouth daily. Patient not taking: Reported on 06/26/2018 11/10/17 11/10/18  Beatriz Stallion., PA-C  clopidogrel (PLAVIX) 75 MG tablet Take 1 tablet (75 mg total) by mouth daily with breakfast. Patient not taking: Reported on 06/26/2018 11/10/17   Beatriz Stallion., PA-C  folic acid (FOLVITE) 1 MG tablet Take 1 tablet (1 mg total) by mouth daily. Patient not taking: Reported on 06/26/2018 11/10/17   Beatriz Stallion., PA-C  losartan (COZAAR) 25 MG tablet Take 1 tablet (25 mg total) by mouth daily. Patient not taking: Reported on 06/26/2018 11/11/17   Beatriz Stallion., PA-C  nitroGLYCERIN (NITROSTAT) 0.4 MG SL tablet Place 1 tablet (0.4 mg total) under the tongue every 5 (five) minutes as needed for chest pain. Patient not taking: Reported on 06/26/2018 11/10/17   Beatriz Stallion., PA-C   Allergies  Allergen Reactions   Codeine Other (See Comments)    ams   Review of Systems  Unable to perform ROS: Dementia    Physical Exam Vitals signs and nursing note reviewed.  Constitutional:      Comments: Disheveled, ill-appearing older man  HENT:     Head: Normocephalic and atraumatic.  Pulmonary:     Effort: Pulmonary effort is normal.  Skin:    General: Skin is warm and dry.     Findings: Bruising present.  Neurological:     Mental Status: He is oriented to person, place, and time.  Psychiatric:        Mood and Affect: Mood normal.     Vital Signs: BP 121/79 (BP Location: Left Arm)    Pulse 100    Temp 98.1 F (36.7 C) (Oral)    Resp 20    Ht 5\' 5"  (1.651 m)    Wt 42.9 kg    SpO2 99%  BMI 15.74 kg/m  Pain Scale: 0-10   Pain Score: 0-No pain   SpO2: SpO2: 99 % O2 Device:SpO2: 99 % O2 Flow Rate: .O2 Flow Rate (L/min): 2 L/min  IO: Intake/output summary:   Intake/Output Summary (Last 24 hours) at 06/27/2018 1134 Last data  filed at 06/27/2018 1044 Gross per 24 hour  Intake 740 ml  Output 500 ml  Net 240 ml    LBM: Last BM Date: 06/26/18 Baseline Weight: Weight: 51 kg Most recent weight: Weight: 42.9 kg     Palliative Assessment/Data:   Flowsheet Rows     Most Recent Value  Intake Tab  Referral Department  Hospitalist  Unit at Time of Referral  Med/Surg Unit  Palliative Care Primary Diagnosis  Trauma  Date Notified  06/26/18  Palliative Care Type  Return patient Palliative Care  Reason for referral  Clarify Goals of Care, Psychosocial or Spiritual support, Counsel Regarding Hospice  Date of Admission  06/26/18  Date first seen by Palliative Care  06/27/18  # of days Palliative referral response time  1 Day(s)  # of days IP prior to Palliative referral  0  Clinical Assessment  Palliative Performance Scale Score  40%  Pain Max last 24 hours  Not able to report  Pain Min Last 24 hours  Not able to report  Dyspnea Max Last 24 Hours  Not able to report  Dyspnea Min Last 24 hours  Not able to report  Nausea Max Last 24 Hours  Not able to report  Nausea Min Last 24 Hours  Not able to report  Anxiety Max Last 24 Hours  Not able to report  Anxiety Min Last 24 Hours  Not able to report  Other Max Last 24 Hours  Not able to report  Psychosocial & Spiritual Assessment  Palliative Care Outcomes  Patient/Family meeting held?  Yes  Who was at the meeting?  pt, and room mate [facility]  Palliative Care Outcomes  Provided psychosocial or spiritual support, Counseled regarding hospice      Time In: 1100 Time Out: 1150 Time Total: 50 min Greater than 50%  of this time was spent counseling and coordinating care related to the above assessment and plan. Staffed with Dr. Ella Jubilee  Signed by: Irean Hong, NP   Please contact Palliative Medicine Team phone at [phone] for questions and concerns.  For individual provider: See Loretha Stapler

## 2018-06-27 NOTE — TOC Initial Note (Signed)
Transition of Care [facility]) - Initial/Assessment Note    Patient Details  Name: Caleb Carpenter MRN: [id number] Date of Birth: 1955/11/30  Transition of Care [facility]) CM/SW Contact:    Cherrie Distance, RN ,MHA,BSN Phone Number: [phone] 06/27/2018, 11:44 AM  Clinical Narrative:                 CM talked to patient at the bedside, introduced my role. Patient lives at home with Friends; PCP: Evelene Croon, MD; has private insurance with Medicaid with prescription drug coverage; pharmacy of choice is [facility]; DME - nebulizer machine at home; he states that he does not smoke but chews tobacco; no home oxygen at home; awaiting for Physical Therapy eval for disposition needs - HHC vs short term SNF - he is agreeable to SNF if needed [facility]. CM will continue to follow for progression of care.  Expected Discharge Plan: Skilled Nursing Facility Barriers to Discharge: No Barriers Identified   Patient Goals and CMS Choice Patient states their goals for this hospitalization and ongoing recovery are:: to breath better CMS Medicare.gov Compare Post Acute Care list provided to:: Patient    Expected Discharge Plan and Services Expected Discharge Plan: Skilled Nursing Facility Discharge Planning Services: CM Consult   Living arrangements for the past 2 months: Single Family Home                          Prior Living Arrangements/Services Living arrangements for the past 2 months: Single Family Home Lives with:: Friends Patient language and need for interpreter reviewed:: No Do you feel safe going back to the place where you live?: Yes      Need for Family Participation in Patient Care: No (Comment) Care giver support system in place?: Yes (comment)   Criminal Activity/Legal Involvement Pertinent to Current Situation/Hospitalization: No - Comment as needed  Activities of Daily Living Home Assistive Devices/Equipment: None ADL Screening (condition at time of  admission) Is the patient deaf or have difficulty hearing?: No Does the patient have difficulty seeing, even when wearing glasses/contacts?: Yes Does the patient have difficulty concentrating, remembering, or making decisions?: No Independently performs ADLs?: Yes (appropriate for developmental age) Weakness of Legs: Both Weakness of Arms/Hands: None  Permission Sought/Granted Permission sought to share information with : Case Manager Permission granted to share information with : Yes, Verbal Permission Granted  Share Information with NAME: Marta Lamas Friend ( cannot remember his telephone number)  Permission granted to share info w AGENCY: SNF, HHC agencies        Emotional Assessment Appearance:: Appears older than stated age Attitude/Demeanor/Rapport: Gracious Affect (typically observed): Accepting Orientation: : Oriented to Self, Oriented to  Time, Oriented to Place, Oriented to Situation Alcohol / Substance Use: Not Applicable Psych Involvement: No (comment)  Admission diagnosis:  Dehydration [E86.0] COPD exacerbation (HCC) [J44.1] Acute respiratory failure with hypoxia (HCC) [J96.01] Patient Active Problem List   Diagnosis Date Noted  . Acute exacerbation of chronic obstructive pulmonary disease (COPD) (HCC) 06/26/2018  . Insomnia 12/15/2017  . Chewing tobacco use 12/15/2017  . 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 COPD with asthma (HCC) 11/22/2017  . Dyslipidemia 11/22/2017  . Evaluation by psychiatric service required   . 3-vessel coronary artery disease 11/03/2017  . DNR (do not resuscitate) 11/03/2017  . Ischemic cardiomyopathy 11/03/2017  . Protein-calorie malnutrition, severe 10/30/2017  . Palliative care encounter   . STEMI (ST elevation myocardial  infarction) (HCC) 10/28/2017  . Syncope 04/01/2017  . Dementia (HCC) 11/06/2016  . Chronic systolic heart failure (HCC) 11/01/2016  . General weakness  09/03/2015  . HTN (hypertension) 09/02/2015  . Alcohol abuse 06/23/2015  . Gastritis    PCP:  Evelene Croon, MD Pharmacy:   [facility] - [place]line Peals, [place] - [address] [place] [address] Phone: [phone] Fax: [phone]     Social Determinants of Health (SDOH) Interventions    Readmission Risk Interventions 30 Day Unplanned Readmission Risk Score     ED to Hosp-Admission (Current) from 06/26/2018 in [facility] 3E CHF  30 Day Unplanned Readmission Risk Score (%)  23 Filed at 06/27/2018 0800     This score is the patient's risk of an unplanned readmission within 30 days of being discharged (0 -100%). The score is based on dignosis, age, lab data, medications, orders, and past utilization.   Low:  0-14.9   Medium: 15-21.9   High: 22-29.9   Extreme: 30 and above       No flowsheet data found.

## 2018-06-27 NOTE — Evaluation (Signed)
Occupational Therapy Evaluation Patient Details Name: Caleb Carpenter MRN: [id number] DOB: April 26, 1955 Today's Date: 06/27/2018    History of Present Illness 63 y.o. male with medical history significant of alcohol related dementia, hypertension, COPD, systolic congestive heart failure with an EF of 20 to 25% presents to the emergency department via EMS status post a ground-level fall and noted acute exacerbation of COPD.   Clinical Impression   PTA, pt was living with a roommate who assisted with IADL tasks. Pt reports independence with basic ADL PTA. At this time, he requires max assist for LB ADL, mod assist for toileting tasks, and min assist for toilet transfers. He is limited by decreased strength and activity tolerance for ADL. Pt would benefit from continued OT services while admitted to improve independence and safety with ADL and functional mobility. Recommend SNF level rehabilitation post-acute D/C. OT will continue to follow while admitted.     Follow Up Recommendations  SNF;Supervision/Assistance - 24 hour    Equipment Recommendations  Other (comment)(TBD)    Recommendations for Other Services       Precautions / Restrictions Precautions Precautions: Fall Restrictions Weight Bearing Restrictions: No      Mobility Bed Mobility Overal bed mobility: Needs Assistance Bed Mobility: Supine to Sit;Sit to Supine     Supine to sit: Min guard Sit to supine: Min guard   General bed mobility comments: min guard for safety  Transfers Overall transfer level: Needs assistance Equipment used: 1 person hand held assist Transfers: Sit to/from Stand Sit to Stand: Min assist         General transfer comment: min assist for safety and stability    Balance Overall balance assessment: History of Falls                                         ADL either performed or assessed with clinical judgement   ADL Overall ADL's : Needs  assistance/impaired Eating/Feeding: Supervision/ safety;Set up;Bed level   Grooming: Minimal assistance;Standing Grooming Details (indicate cue type and reason): assist primarily for balance Upper Body Bathing: Sitting;Moderate assistance   Lower Body Bathing: Maximal assistance;Sit to/from stand   Upper Body Dressing : Minimal assistance;Sitting   Lower Body Dressing: Moderate assistance;Sit to/from stand Lower Body Dressing Details (indicate cue type and reason): Able to assist with socks once initiated Toilet Transfer: Minimal assistance;Ambulation Toilet Transfer Details (indicate cue type and reason): furniture/wall walking Toileting- Clothing Manipulation and Hygiene: Moderate assistance;Sit to/from stand       Functional mobility during ADLs: Minimal assistance General ADL Comments: Pt limited by decreased activity tolerance for ADL. HR up to 170s but likely artifact     Vision Patient Visual Report: No change from baseline       Perception     Praxis      Pertinent Vitals/Pain Pain Assessment: Faces Faces Pain Scale: Hurts even more Pain Location: left leg and IV sites Pain Descriptors / Indicators: Sore;Discomfort Pain Intervention(s): Monitored during session     Hand Dominance Right   Extremity/Trunk Assessment Upper Extremity Assessment Upper Extremity Assessment: Generalized weakness   Lower Extremity Assessment Lower Extremity Assessment: Generalized weakness   Cervical / Trunk Assessment Cervical / Trunk Assessment: Kyphotic   Communication Communication Communication: HOH   Cognition Arousal/Alertness: Awake/alert Behavior During Therapy: WFL for tasks assessed/performed Overall Cognitive Status: No family/caregiver present to determine baseline cognitive functioning  General Comments  Significantly limited by weakness and decreased activity tolerance    Exercises     Shoulder  Instructions      Home Living Family/patient expects to be discharged to:: Private residence Living Arrangements: Non-relatives/Friends Available Help at Discharge: Friend(s);Available 24 hours/day Type of Home: House Home Access: Ramped entrance     Home Layout: One level     Bathroom Shower/Tub: Chief Strategy Officer: Standard Bathroom Accessibility: Yes   Home Equipment: Walker - 2 wheels   Additional Comments: Pt reports he lives with a friend named Alicia Amel, who is the brother of his sister's husband.   [name] works 3 days/wk, and provides transportation to MD appointements       Prior Functioning/Environment Level of Independence: Needs assistance  Gait / Transfers Assistance Needed: reports independence with ambulation ADL's / Homemaking Assistance Needed: friend helps with cooking and house care tasks but patient reports he does his own bathing and dressing   Comments: Some questionable history, unclear if patient is truly a reliable historian        OT Problem List: Decreased strength;Decreased range of motion;Decreased activity tolerance;Impaired balance (sitting and/or standing);Decreased safety awareness;Decreased knowledge of use of DME or AE;Decreased cognition;Decreased knowledge of precautions;Pain;Impaired UE functional use      OT Treatment/Interventions: Self-care/ADL training;Therapeutic exercise;Energy conservation;DME and/or AE instruction;Therapeutic activities;Cognitive remediation/compensation;Patient/family education;Balance training    OT Goals(Current goals can be found in the care plan section) Acute Rehab OT Goals Patient Stated Goal: to go home OT Goal Formulation: With patient Time For Goal Achievement: 07/11/18 Potential to Achieve Goals: Good ADL Goals Pt Will Perform Grooming: with supervision;standing Pt Will Perform Upper Body Dressing: with supervision;sitting Pt Will Perform Lower Body Dressing: with supervision;sit  to/from stand Pt Will Transfer to Toilet: with supervision;ambulating;regular height toilet Pt Will Perform Toileting - Clothing Manipulation and hygiene: with supervision;sit to/from stand  OT Frequency: Min 2X/week   Barriers to D/C:            Co-evaluation PT/OT/SLP Co-Evaluation/Treatment: Yes Reason for Co-Treatment: For patient/therapist safety;Complexity of the patient's impairments (multi-system involvement) PT goals addressed during session: Mobility/safety with mobility OT goals addressed during session: ADL's and self-care      AM-PAC OT "6 Clicks" Daily Activity     Outcome Measure Help from another person eating meals?: None Help from another person taking care of personal grooming?: A Little Help from another person toileting, which includes using toliet, bedpan, or urinal?: A Lot Help from another person bathing (including washing, rinsing, drying)?: A Lot Help from another person to put on and taking off regular upper body clothing?: A Little Help from another person to put on and taking off regular lower body clothing?: A Lot 6 Click Score: 16   End of Session Equipment Utilized During Treatment: Oxygen(1.5 L taken off during mobility) Nurse Communication: Mobility status  Activity Tolerance: Patient limited by fatigue Patient left: in bed;with call bell/phone within reach;with bed alarm set  OT Visit Diagnosis: Other abnormalities of gait and mobility (R26.89);Muscle weakness (generalized) (M62.81);Pain Pain - Right/Left: Left Pain - part of body: Ankle and joints of foot;Leg                Time: 2549-8264 OT Time Calculation (min): 21 min Charges:  OT General Charges $OT Visit: 1 Visit OT Evaluation $OT Eval Moderate Complexity: 1 [address] [place], OTR/L Acute Rehabilitation Services Office [phone]   Cope Marte A Lorrin Nawrot 06/27/2018, 8:43 AM

## 2018-06-27 NOTE — Progress Notes (Signed)
Per CCMD patient has been NSR however this morning HR went up to 170 bpm. Patient was working with OT at that time, not sure was that heart rate was accurate due to moving. HR went down after few seconds and patient was asymptomatic.  Kaysen Sefcik, RN

## 2018-06-28 NOTE — Progress Notes (Signed)
MD aware that PT/OT recommended SNF for the patient. Patient is willing to go home and per MD has good support at home.Ok to d/c home

## 2018-06-28 NOTE — Progress Notes (Signed)
Patient discharged: Home   Via: Wheelchair   Discharge paperwork given: to patient and family  Reviewed with teach back  IV and telemetry disconnected  Belongings given to patient    

## 2018-06-28 NOTE — Discharge Summary (Signed)
Physician Discharge Summary  Caleb Carpenter YPP:[id number] DOB: [birth year] DOA: 06/26/2018  PCP: Evelene Croon, MD  Admit date: 06/26/2018 Discharge date: 06/28/2018  Admitted From: Home  Disposition:  Home   Recommendations for Outpatient Follow-up and new medication changes:  1. Follow up with Dr. Lacie Scotts in 7 days.  2. Patient has home nursing care, he does qualify for hospice, but likely he has more support from his current home care.   Home Health: Yes   Equipment/Devices: no    Discharge Condition: stable  CODE STATUS:dnr   Diet recommendation: heart healthy   Brief/Interim Summary: 63 year old male who presented with dyspnea.  He does have significant past medical history for alcohol abuse, dementia, hypertension, COPD, systolic heart failure ejection fraction 20 to 25%.  Apparently he fell from his own height the day before admission.  Apparently patient has been not eating or drinking well for the last 2 days.  No apparent head trauma.  On his initial physical examination his oximetry was 80% on room air, that corrected to 98% on 4 L of supplemental oxygen per nasal cannula, he had moist mucous membranes, his lungs had positive bilateral wheezing, heart S1-S2 present and rhythmic, the abdomen was soft, no lower extremity edema.  He was ill looking appearing and cachectic.  Sodium 130, potassium 4.5, chloride 98, bicarb 20, glucose 81, BUN less than 5, creatinine 1.72, BNP 92.8, venous lactic acid 3.9, white count 4.8, hemoglobin 10.6, hematocrit 35.6, platelets 161.  His urinalysis was negative for infection.  His chest x-ray had hyperinflation, chronic rib fractures bilaterally, chronic fracture T11/T12.  His EKG had low voltage, baseline artifact, sinus, rate of 120 bpm, normal axis, normal intervals, poor R wave progression, no significant ST segment changes, negative T waves in V4 V5.  Patient was admitted to the hospital with a working diagnosis of COPD exacerbation with acute  hypoxic respiratory failure.   1.  Acute hypoxic respiratory failure due to COPD exacerbation.  Patient was admitted to the medical ward, he received bronchodilator therapy and systemic steroids, oximetry monitoring and supplemental oxygen per nasal cannula.  He responded well to medical therapy, at discharge his oxygen saturation was 95% on room air.  At home patient will continue albuterol and tiotropium.  2.  Reactive tachycardia.  Patient had episode of tachycardia, sinus rhythm, his beta-blocker, metoprolol was resumed with improvement of his heart rate.  3.  Alcohol abuse.  No signs of active alcohol withdrawal, patient was placed on as needed lorazepam along with thiamine and folic acid.  4.  Systolic heart failure.  Stable with no signs of decompensation, continue medical therapy with beta blockade.  Very poor oral intake, currently he is not on diuretics.  Will need close follow-up as an outpatient.  5.  Calorie protein malnutrition.  Continue nutritional supplements.   Discharge Diagnoses:  Principal Problem:   COPD with asthma (HCC) Active Problems:   Alcohol abuse   Chronic systolic heart failure (HCC)   Goals of care, counseling/discussion   Protein-calorie malnutrition, severe   3-vessel coronary artery disease   Ischemic cardiomyopathy   Acute exacerbation of chronic obstructive pulmonary disease (COPD) (HCC)   Palliative care by specialist    Discharge Instructions   Allergies as of 06/28/2018      Reactions   Codeine Other (See Comments)   ams      Medication List    STOP taking these medications   acetaminophen 325 MG tablet Commonly known as:  TYLENOL  aspirin 81 MG chewable tablet   atorvastatin 40 MG tablet Commonly known as:  Lipitor   clopidogrel 75 MG tablet Commonly known as:  PLAVIX   folic acid 1 MG tablet Commonly known as:  FOLVITE   losartan 25 MG tablet Commonly known as:  COZAAR   nitroGLYCERIN 0.4 MG SL tablet Commonly known  as:  NITROSTAT     TAKE these medications   albuterol 108 (90 Base) MCG/ACT inhaler Commonly known as:  PROVENTIL HFA;VENTOLIN HFA Inhale 2 puffs into the lungs every 4 (four) hours as needed for wheezing.   metoprolol succinate 50 MG 24 hr tablet Commonly known as:  Toprol XL Take 1 tablet (50 mg total) by mouth daily. Take with or immediately following a meal.   pantoprazole 40 MG tablet Commonly known as:  PROTONIX Take 40 mg by mouth daily.   rosuvastatin 10 MG tablet Commonly known as:  CRESTOR Take 10 mg by mouth daily.   Stiolto Respimat 2.5-2.5 MCG/ACT Aers Generic drug:  Tiotropium Bromide-Olodaterol Inhale 2 puffs into the lungs daily.       Allergies  Allergen Reactions  . Codeine Other (See Comments)    ams    Consultations:  Palliative care.  Patient does qualify for hospice and home, currently he does have home care.  Recommendations to continue current services.   Procedures/Studies: Dg Chest 2 View  Result Date: 06/26/2018 CLINICAL DATA:  Short of breath COPD EXAM: CHEST - 2 VIEW COMPARISON:  04/07/2018 FINDINGS: COPD with pulmonary hyperinflation and emphysema. Heart size normal. Negative for pneumonia. Negative for heart failure or effusion. Chronic rib fractures bilaterally. Chronic fracture approximately T11 or T12. 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 IMPRESSION: COPD.  No acute cardiopulmonary abnormality. Electronically Signed   By: Marlan Palau M.D.   On: 06/26/2018 14:16   Dg Elbow Complete Right  Result Date: 06/26/2018 CLINICAL DATA:  Fall.  Elbow pain EXAM: RIGHT ELBOW - COMPLETE 3+ VIEW COMPARISON:  09/26/2015 FINDINGS: Negative for fracture or dislocation. No significant degenerative change or effusion. Osteopenia.  Arterial calcification. IMPRESSION: Negative for fracture. Electronically Signed   By: Marlan Palau M.D.   On: 06/26/2018 14:18       Subjective: Patient is feeling  better, his dyspnea has improved, no chest pain, no cough, no nausea or vomiting.   Discharge Exam: Vitals:   06/28/18 0449 06/28/18 0830  BP: 133/83 126/85  Pulse: 91 96  Resp: 16   Temp: 98.1 F (36.7 C)   SpO2: 94% 95%   Vitals:   06/27/18 2300 06/28/18 0100 06/28/18 0449 06/28/18 0830  BP:   133/83 126/85  Pulse:   91 96  Resp:   16   Temp:   98.1 F (36.7 C)   TempSrc:   Oral   SpO2: 96% 98% 94% 95%  Weight:   42.9 kg   Height:        General: Not in pain or dyspnea. Cachetic and deconditioned  Neurology: Awake and alert, non focal  E ENT: mild pallor, no icterus, oral mucosa moist Cardiovascular: No JVD. S1-S2 present, rhythmic, no gallops, rubs, or murmurs. No lower extremity edema. Pulmonary: positive breath sounds bilaterally, adequate air movement, no wheezing, rhonchi or rales. Gastrointestinal. Abdomen with no organomegaly, non tender, no rebound or guarding Skin. No rashes Musculoskeletal: no joint deformities   The results of significant diagnostics from this hospitalization (including imaging, microbiology, ancillary and laboratory) are listed below for reference.  Microbiology: Recent Results (from the past 240 hour(s))  Culture, blood (Routine x 2)     Status: None (Preliminary result)   Collection Time: 06/26/18 12:34 PM  Result Value Ref Range Status   Specimen Description BLOOD LEFT FOREARM  Final   Special Requests   Final    BOTTLES DRAWN AEROBIC AND ANAEROBIC Blood Culture results may not be optimal due to an inadequate volume of blood received in culture bottles   Culture   Final    NO GROWTH 1 DAY Performed at [facility], [address] [place], [place] [address]    Report Status PENDING  Incomplete  Culture, blood (Routine x 2)     Status: None (Preliminary result)   Collection Time: 06/26/18 12:41 PM  Result Value Ref Range Status   Specimen Description BLOOD LEFT ANTECUBITAL  Final   Special Requests   Final    BOTTLES  DRAWN AEROBIC AND ANAEROBIC Blood Culture results may not be optimal due to an inadequate volume of blood received in culture bottles   Culture   Final    NO GROWTH 1 DAY Performed at [facility], [address] [place], [place] [address]    Report Status PENDING  Incomplete     Labs: BNP (last 3 results) Recent Labs    10/29/17 0702 06/26/18 1247  BNP 3,014.6* 92.8   Basic Metabolic Panel: Recent Labs  Lab 06/21/18 1936 06/26/18 1247  NA 134* 130*  K 4.3 4.5  CL 104 98  CO2 20* 20*  GLUCOSE 111* 81  BUN 5* <5*  CREATININE 1.02 0.72  CALCIUM 8.1* 8.5*   Liver Function Tests: Recent Labs  Lab 06/21/18 1936  AST 41  ALT 13  ALKPHOS 88  BILITOT 0.5  PROT 6.0*  ALBUMIN 2.6*   No results for input(s): LIPASE, AMYLASE in the last 168 hours. No results for input(s): AMMONIA in the last 168 hours. CBC: Recent Labs  Lab 06/21/18 1936 06/26/18 1247  WBC 4.4 4.8  HGB 9.6* 10.6*  HCT 31.5* 35.6*  MCV 93.8 92.7  PLT 145* 161   Cardiac Enzymes: No results for input(s): CKTOTAL, CKMB, CKMBINDEX, TROPONINI in the last 168 hours. BNP: Invalid input(s): POCBNP CBG: No results for input(s): GLUCAP in the last 168 hours. D-Dimer No results for input(s): DDIMER in the last 72 hours. Hgb A1c No results for input(s): HGBA1C in the last 72 hours. Lipid Profile No results for input(s): CHOL, HDL, LDLCALC, TRIG, CHOLHDL, LDLDIRECT in the last 72 hours. Thyroid function studies No results for input(s): TSH, T4TOTAL, T3FREE, THYROIDAB in the last 72 hours.  Invalid input(s): FREET3 Anemia work up No results for input(s): VITAMINB12, FOLATE, FERRITIN, TIBC, IRON, RETICCTPCT in the last 72 hours. Urinalysis    Component Value Date/Time   COLORURINE YELLOW 06/26/2018 1615   APPEARANCEUR CLEAR 06/26/2018 1615   LABSPEC 1.008 06/26/2018 1615   PHURINE 5.0 06/26/2018 1615   GLUCOSEU NEGATIVE 06/26/2018 1615   HGBUR NEGATIVE 06/26/2018 1615   BILIRUBINUR NEGATIVE  06/26/2018 1615   KETONESUR NEGATIVE 06/26/2018 1615   PROTEINUR NEGATIVE 06/26/2018 1615   NITRITE NEGATIVE 06/26/2018 1615   LEUKOCYTESUR NEGATIVE 06/26/2018 1615   Sepsis Labs Invalid input(s): PROCALCITONIN,  WBC,  LACTICIDVEN Microbiology Recent Results (from the past 240 hour(s))  Culture, blood (Routine x 2)     Status: None (Preliminary result)   Collection Time: 06/26/18 12:34 PM  Result Value Ref Range Status   Specimen Description BLOOD LEFT FOREARM  Final   Special Requests   Final    BOTTLES DRAWN AEROBIC AND ANAEROBIC Blood Culture results may not be optimal due to an inadequate volume of blood received in culture bottles   Culture   Final    NO GROWTH 1 DAY Performed at [facility], [address] [place], [place] [address]    Report Status PENDING  Incomplete  Culture, blood (Routine x 2)     Status: None (Preliminary result)   Collection Time: 06/26/18 12:41 PM  Result Value Ref Range Status   Specimen Description BLOOD LEFT ANTECUBITAL  Final   Special Requests   Final    BOTTLES DRAWN AEROBIC AND ANAEROBIC Blood Culture results may not be optimal due to an inadequate volume of blood received in culture bottles   Culture   Final    NO GROWTH 1 DAY Performed at [facility], [address] [place], [place] [address]    Report Status PENDING  Incomplete     Time coordinating discharge: 45 minutes  SIGNED:   Coralie Keens, MD  [facility] 06/28/2018, 9:19 AM

## 2018-06-28 NOTE — Progress Notes (Signed)
IV and teli box removed. Patient is waiting for friend to come and pick him up. AVS ready.

## 2018-06-29 ENCOUNTER — Other Ambulatory Visit: Payer: Self-pay

## 2018-06-29 ENCOUNTER — Encounter (HOSPITAL_COMMUNITY): Payer: Self-pay

## 2018-06-29 ENCOUNTER — Emergency Department (HOSPITAL_COMMUNITY)
Admission: EM | Admit: 2018-06-29 | Discharge: 2018-06-29 | Disposition: A | Discharge status: Home / Self Care | Payer: Medicaid Other | Attending: Emergency Medicine | Admitting: Emergency Medicine

## 2018-06-29 DIAGNOSIS — Z79899 Other long term (current) drug therapy: Secondary | ICD-10-CM | POA: Diagnosis not present

## 2018-06-29 DIAGNOSIS — F17220 Nicotine dependence, chewing tobacco, uncomplicated: Secondary | ICD-10-CM | POA: Insufficient documentation

## 2018-06-29 DIAGNOSIS — D649 Anemia, unspecified: Secondary | ICD-10-CM

## 2018-06-29 DIAGNOSIS — J449 Chronic obstructive pulmonary disease, unspecified: Secondary | ICD-10-CM | POA: Diagnosis not present

## 2018-06-29 DIAGNOSIS — I110 Hypertensive heart disease with heart failure: Secondary | ICD-10-CM | POA: Insufficient documentation

## 2018-06-29 DIAGNOSIS — I252 Old myocardial infarction: Secondary | ICD-10-CM | POA: Insufficient documentation

## 2018-06-29 DIAGNOSIS — I5022 Chronic systolic (congestive) heart failure: Secondary | ICD-10-CM | POA: Diagnosis not present

## 2018-06-29 DIAGNOSIS — F0390 Unspecified dementia without behavioral disturbance: Secondary | ICD-10-CM | POA: Insufficient documentation

## 2018-06-29 DIAGNOSIS — R531 Weakness: Secondary | ICD-10-CM

## 2018-06-29 LAB — CBC WITH DIFFERENTIAL/PLATELET
Abs Immature Granulocytes: 0.03 10*3/uL (ref 0.00–0.07)
BASOS PCT: 0 %
Basophils Absolute: 0 10*3/uL (ref 0.0–0.1)
Eosinophils Absolute: 0 10*3/uL (ref 0.0–0.5)
Eosinophils Relative: 0 %
HCT: 27.5 % — ABNORMAL LOW (ref 39.0–52.0)
Hemoglobin: 8.4 g/dL — ABNORMAL LOW (ref 13.0–17.0)
Immature Granulocytes: 1 %
Lymphocytes Relative: 37 %
Lymphs Abs: 2.2 10*3/uL (ref 0.7–4.0)
MCH: 29.2 pg (ref 26.0–34.0)
MCHC: 30.5 g/dL (ref 30.0–36.0)
MCV: 95.5 fL (ref 80.0–100.0)
MONOS PCT: 7 %
Monocytes Absolute: 0.4 10*3/uL (ref 0.1–1.0)
Neutro Abs: 3.3 10*3/uL (ref 1.7–7.7)
Neutrophils Relative %: 55 %
PLATELETS: 154 10*3/uL (ref 150–400)
RBC: 2.88 MIL/uL — ABNORMAL LOW (ref 4.22–5.81)
RDW: 18.5 % — ABNORMAL HIGH (ref 11.5–15.5)
WBC: 6 10*3/uL (ref 4.0–10.5)
nRBC: 0 % (ref 0.0–0.2)

## 2018-06-29 LAB — COMPREHENSIVE METABOLIC PANEL
ALT: 23 U/L (ref 0–44)
AST: 93 U/L — ABNORMAL HIGH (ref 15–41)
Albumin: 2.6 g/dL — ABNORMAL LOW (ref 3.5–5.0)
Alkaline Phosphatase: 62 U/L (ref 38–126)
Anion gap: 10 (ref 5–15)
BUN: 8 mg/dL (ref 8–23)
CO2: 22 mmol/L (ref 22–32)
Calcium: 8 mg/dL — ABNORMAL LOW (ref 8.9–10.3)
Chloride: 96 mmol/L — ABNORMAL LOW (ref 98–111)
Creatinine, Ser: 0.66 mg/dL (ref 0.61–1.24)
GFR calc Af Amer: >60 mL/min (ref >60)
GFR calc non Af Amer: >60 mL/min (ref >60)
Glucose, Bld: 94 mg/dL (ref 70–99)
Potassium: 3.8 mmol/L (ref 3.5–5.1)
Sodium: 128 mmol/L — ABNORMAL LOW (ref 135–145)
Total Bilirubin: 0.3 mg/dL (ref 0.3–1.2)
Total Protein: 5.2 g/dL — ABNORMAL LOW (ref 6.5–8.1)

## 2018-06-29 LAB — POC OCCULT BLOOD, ED: Fecal Occult Bld: NEGATIVE

## 2018-06-29 NOTE — Discharge Instructions (Addendum)
Please STOP taking losartan (cozaar) and metoprolol (toprol-XL). You should not be on extra blood pressure medication.   I verified that we will be sending home health out to your house.  They will recheck your hemoglobin later this week.   Please return the emergency department if you develop any worsening weakness, shortness of breath, chest pain, or new or worsening symptoms.   Thank you for allowing [place] to participate in your care today.

## 2018-06-29 NOTE — ED Notes (Signed)
Called [facility] 

## 2018-06-29 NOTE — ED Triage Notes (Signed)
Pt BIB [facility] for eval of generalized weakness, unsteadiness. Pt w/ likely decreased PO intake, failure to thrive per EMS. Seen here recently for same, pt w/ known hx of daily ETOH use.

## 2018-06-29 NOTE — ED Provider Notes (Signed)
[facility] EMERGENCY DEPARTMENT Provider Note   CSN: [id number] Arrival date & time: 06/29/18  1910    History   Chief Complaint Chief Complaint  Patient presents with  . Weakness    HPI Caleb Carpenter is a 63 y.o. male.     HPI  Patient is a 63 year old male with a history of COPD, alcohol abuse, ischemic cardiomyopathy with ejection fraction 20-25%, severe protein calorie malnutrition, presenting for generalized weakness.  He reports that his roommate called EMS because he was "weak".  Patient ports he was discharged in the hospital 3 days ago.  He reports that he is continued to be generally weak. He denies any falls since returning home.  He denies being significantly short of breath.  He denies any chest pain, fevers, chills, cough, congestion, rhinorrhea, abdominal pain, nausea, vomiting.  He denies any bloody stools or dark tarry stools.  He reports that he does attempt to ambulate at home, but he is very unsteady and has frequent falls.  He reports he has someone from home health coming out to do an evaluation at 2 PM tomorrow.  Past Medical History:  Diagnosis Date  . Asthma   . COPD (chronic obstructive pulmonary disease) (HCC)   . Hypertension   . 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 NSTEMI (non-ST elevated myocardial infarction) [facility](HCC)     Patient Active Problem List   Diagnosis Date Noted  . Palliative care by specialist   . Acute exacerbation of chronic obstructive pulmonary disease (COPD) (HCC) 06/26/2018  . Insomnia 12/15/2017  . Chewing tobacco use 12/15/2017  . 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 COPD with asthma (HCC) 11/22/2017  . Dyslipidemia 11/22/2017  . Evaluation by psychiatric service required   . 3-vessel coronary artery disease 11/03/2017  . DNR (do not resuscitate) 11/03/2017  . Ischemic cardiomyopathy 11/03/2017  .  Protein-calorie malnutrition, severe 10/30/2017  . Goals of care, counseling/discussion   . STEMI (ST elevation myocardial infarction) (HCC) 10/28/2017  . Syncope 04/01/2017  . Dementia (HCC) 11/06/2016  . Chronic systolic heart failure (HCC) 11/01/2016  . General weakness 09/03/2015  . HTN (hypertension) 09/02/2015  . Alcohol abuse 06/23/2015  . Gastritis     Past Surgical History:  Procedure Laterality Date  . CATARACT EXTRACTION, BILATERAL Bilateral   . ESOPHAGOGASTRODUODENOSCOPY (EGD) WITH PROPOFOL N/A 06/21/2015   Procedure: ESOPHAGOGASTRODUODENOSCOPY (EGD) WITH PROPOFOL;  Surgeon: Midge Miniumarren Wohl, MD;  Location: [facility] ENDOSCOPY;  Service: Endoscopy;  Laterality: N/A;  . LEFT HEART CATH AND CORONARY ANGIOGRAPHY N/A 10/28/2017   Procedure: LEFT HEART CATH AND CORONARY ANGIOGRAPHY;  Surgeon: [place], Peter M, MD;  [facility];  Service: Cardiovascular;  Laterality: N/A;        Home Medications    Prior to Admission medications   Medication Sig Start Date End Date Taking? Authorizing Provider  albuterol (PROVENTIL HFA;VENTOLIN HFA) 108 (90 Base) MCG/ACT inhaler Inhale 2 puffs into the lungs every 4 (four) hours as needed for wheezing.    Yes [provider]  metoprolol succinate (TOPROL XL) 50 MG 24 hr tablet Take 1 tablet (50 mg total) by mouth daily. Take with or immediately following a meal. Patient taking differently: Take 25 mg by mouth daily. Take with or immediately following a meal. 11/10/17 11/10/18 Yes Kroeger, Ovidio KinKrista M., PA-C  pantoprazole (PROTONIX) 40 MG tablet Take 40 mg by mouth daily.   Yes [provider]  rosuvastatin (CRESTOR) 10 MG tablet Take 10 mg by mouth daily.   Yes [provider]  Tiotropium Bromide-Olodaterol (STIOLTO RESPIMAT) 2.5-2.5 MCG/ACT AERS Inhale 2 puffs into the lungs daily.   Yes [provider]    Family History Family History  Problem Relation Age of Onset  . Diabetes Other   . Hypertension  Other     Social History Social History   Tobacco Use  . Smoking status: Former Smoker    Types: Cigarettes    Last attempt to quit: 06/21/2014    Years since quitting: 4.0  . Smokeless tobacco: Current User    Types: Chew  Substance Use Topics  . Alcohol use: Yes    Alcohol/week: 2.0 standard drinks    Types: 2 Cans of beer per week    Comment: per day  . Drug use: No     Allergies   Codeine   Review of Systems Review of Systems  Constitutional: Negative for chills and fever.  HENT: Negative for congestion and sore throat.   Eyes: Negative for visual disturbance.  Respiratory: Negative for cough, chest tightness and shortness of breath.   Cardiovascular: Negative for chest pain, palpitations and leg swelling.  Gastrointestinal: Negative for abdominal pain, diarrhea, nausea and vomiting.  Genitourinary: Negative for dysuria and flank pain.  Musculoskeletal: Negative for back pain and myalgias.  Skin: Negative for rash and wound.  Neurological: Positive for weakness. Negative for dizziness, syncope and light-headedness.       +Generalized weakness.      Physical Exam Updated Vital Signs BP 107/72 (BP Location: Right Arm)   Pulse 76   Temp 97.7 F (36.5 C) (Oral)   Resp 13   Ht 5\' 5"  (1.651 m)   Wt 43 kg   SpO2 100%   BMI 15.78 kg/m   Physical Exam Vitals signs and nursing note reviewed.  Constitutional:      General: He is not in acute distress.    Appearance: He is well-developed. He is ill-appearing.     Comments: +Chronically ill appearing and cachetic. In no acute distress.   HENT:     Head: Normocephalic and atraumatic.  Eyes:     Conjunctiva/sclera: Conjunctivae normal.     Pupils: Pupils are equal, round, and reactive to light.  Neck:     Musculoskeletal: Normal range of motion and neck supple.  Cardiovascular:     Rate and Rhythm: Normal rate and regular rhythm.     Heart sounds: S1 normal and S2 normal. No murmur.  Pulmonary:     Effort:  Pulmonary effort is normal. No respiratory distress.     Breath sounds: No wheezing or rales.     Comments: Diminished lung sounds in bilateral lung fields.  No respiratory distress. Abdominal:     General: There is no distension.     Palpations: Abdomen is soft.     Tenderness: There is no abdominal tenderness. There is no guarding.  Genitourinary:    Comments: Rectal examination performed with nurse tech chaperone present.  Patient has no melena or hematochezia in the rectal vault.  Normal rectal tone. Examination of the sacrum reveals mild erythema but no evidence of skin breakdown or active pressure ulcer. Musculoskeletal: Normal range of motion.        General: No deformity.  Lymphadenopathy:     Cervical: No cervical adenopathy.  Skin:    General: Skin is warm and dry.     Findings: Bruising present. No erythema or rash.  Comments: Ecchymosis present over bilateral arms.  Skin tear noted to right lateral elbow, nonbleeding and well-healing.  Neurological:     Mental Status: He is alert.     Comments: Cranial nerves grossly intact. Patient moves extremities symmetrically and with good coordination.  Psychiatric:        Behavior: Behavior normal.        Thought Content: Thought content normal.        Judgment: Judgment normal.      ED Treatments / Results  Labs (all labs ordered are listed, but only abnormal results are displayed) Labs Reviewed  CBC WITH DIFFERENTIAL/PLATELET - Abnormal; Notable for the following components:      Result Value   RBC 2.88 (*)    Hemoglobin 8.4 (*)    HCT 27.5 (*)    RDW 18.5 (*)    All other components within normal limits  COMPREHENSIVE METABOLIC PANEL - Abnormal; Notable for the following components:   Sodium 128 (*)    Chloride 96 (*)    Calcium 8.0 (*)    Total Protein 5.2 (*)    Albumin 2.6 (*)    AST 93 (*)    All other components within normal limits    EKG None  ED ECG REPORT   Date: 06/29/2018  Rate: 83  Rhythm:  normal sinus rhythm  QRS Axis: normal  Intervals: normal  ST/T Wave abnormalities: normal  Conduction Disutrbances:Generally low voltage.   Narrative Interpretation: Q wave waves in anterolateral leads suggesting old infarct  Old EKG Reviewed: unchanged  I have personally reviewed the EKG tracing and agree with the computerized printout as noted. Reviewed by Dr. Estell Harpin.   Radiology No results found.  Procedures Procedures (including critical care time)  Medications Ordered in ED Medications - No data to display   Initial Impression / Assessment and Plan / ED Course  I have reviewed the triage vital signs and the nursing notes.  Pertinent labs & imaging results that were available during my care of the patient were reviewed by me and considered in my medical decision making (see chart for details).  Clinical Course as of Jun 28 2136  Mon Jun 29, 2018  2126 Spoke with Burna Mortimer of [facility] about repeat hgb and HH needs. Appreciate her involvement.    [AM]    Clinical Course User Index [AM] Elisha Ponder, PA-C       This is a 63 year old gentleman with multiple end-stage diseases, currently in the process of obtaining home health and palliative care.  He has been a DNR on prior evaluations in hospital.  He was just discharged 3 days ago from the hospital after an episode of acute hypoxic respiratory failure.  He appears to be doing well from this.  He appears chronically ill and weak.  He does not have any acute complaints today, reports that his roommate called 911.  On further questioning of his medication management, it does appear that he is continuing to take some of his blood pressure medications.  I emphasized with the patient that he should not be taking any more of his BP medications, as he has soft BP at baseline with his end-stage heart failure.  Patient's lab work is showing a slight drop in hemoglobin today.  It is 8.4.  Appears that his baseline is around 9.  This may be  related to recent hospitalization with IV fluids.  See below for trend.  He does not appear to be symptomatic.  His blood  pressure has normalized here in the emergency department.  He is not having any active GI bleeding or other bleeding source.  Arranged for case management to have repeat hemoglobin through home health later in the week.  Appreciate their involvement.  Regarding patient's home health needs, patient reports he has an evaluation at 2 PM tomorrow evening.  Discussed case with Anola Gurney, RN of case management, who will follow up to ensure that he has the appropriate level of care set up for him at home health.  I appreciate her involvement.  Patient to return precautions for any new or worsening symptoms.  It was medically necessary to transport patient by PTR, as he is very unsteady when he tries to ambulate, and he has multiple end-stage disease processes that make transport by any other modality unsafe.  Appreciate their involvement.  This is a supervised visit with Dr. Bethann Berkshire. Evaluation, management, and discharge planning discussed with this attending physician.  Final Clinical Impressions(s) / ED Diagnoses   Final diagnoses:  Generalized weakness  Anemia, unspecified type    ED Discharge Orders    None       Delia Chimes 06/29/18 2153    Bethann Berkshire, MD 06/29/18 [id number]

## 2018-07-01 LAB — CULTURE, BLOOD (ROUTINE X 2)
Culture: NO GROWTH
Culture: NO GROWTH

## 2018-08-02 ENCOUNTER — Inpatient Hospital Stay (HOSPITAL_COMMUNITY): Payer: Medicaid Other

## 2018-08-02 ENCOUNTER — Emergency Department (HOSPITAL_COMMUNITY): Payer: Medicaid Other

## 2018-08-02 ENCOUNTER — Inpatient Hospital Stay (HOSPITAL_COMMUNITY)
Admission: EM | Admit: 2018-08-02 | Discharge: 2018-08-14 | DRG: 064 | Disposition: E | Payer: Medicaid Other | Attending: Pulmonary Disease | Admitting: Pulmonary Disease

## 2018-08-02 DIAGNOSIS — I255 Ischemic cardiomyopathy: Secondary | ICD-10-CM | POA: Diagnosis present

## 2018-08-02 DIAGNOSIS — Z66 Do not resuscitate: Secondary | ICD-10-CM

## 2018-08-02 DIAGNOSIS — R402352 Coma scale, best motor response, localizes pain, at arrival to emergency department: Secondary | ICD-10-CM | POA: Diagnosis present

## 2018-08-02 DIAGNOSIS — R402212 Coma scale, best verbal response, none, at arrival to emergency department: Secondary | ICD-10-CM | POA: Diagnosis present

## 2018-08-02 DIAGNOSIS — J988 Other specified respiratory disorders: Secondary | ICD-10-CM | POA: Diagnosis not present

## 2018-08-02 DIAGNOSIS — I63212 Cerebral infarction due to unspecified occlusion or stenosis of left vertebral arteries: Principal | ICD-10-CM | POA: Diagnosis present

## 2018-08-02 DIAGNOSIS — R64 Cachexia: Secondary | ICD-10-CM | POA: Diagnosis not present

## 2018-08-02 DIAGNOSIS — F1010 Alcohol abuse, uncomplicated: Secondary | ICD-10-CM | POA: Diagnosis present

## 2018-08-02 DIAGNOSIS — G8194 Hemiplegia, unspecified affecting left nondominant side: Secondary | ICD-10-CM | POA: Diagnosis not present

## 2018-08-02 DIAGNOSIS — E871 Hypo-osmolality and hyponatremia: Secondary | ICD-10-CM | POA: Diagnosis present

## 2018-08-02 DIAGNOSIS — R627 Adult failure to thrive: Secondary | ICD-10-CM | POA: Diagnosis present

## 2018-08-02 DIAGNOSIS — J449 Chronic obstructive pulmonary disease, unspecified: Secondary | ICD-10-CM | POA: Diagnosis present

## 2018-08-02 DIAGNOSIS — R402432 Glasgow coma scale score 3-8, at arrival to emergency department: Secondary | ICD-10-CM | POA: Diagnosis not present

## 2018-08-02 DIAGNOSIS — I5022 Chronic systolic (congestive) heart failure: Secondary | ICD-10-CM | POA: Diagnosis present

## 2018-08-02 DIAGNOSIS — F0390 Unspecified dementia without behavioral disturbance: Secondary | ICD-10-CM | POA: Diagnosis present

## 2018-08-02 DIAGNOSIS — D6959 Other secondary thrombocytopenia: Secondary | ICD-10-CM | POA: Diagnosis present

## 2018-08-02 DIAGNOSIS — G9341 Metabolic encephalopathy: Secondary | ICD-10-CM | POA: Diagnosis present

## 2018-08-02 DIAGNOSIS — F17220 Nicotine dependence, chewing tobacco, uncomplicated: Secondary | ICD-10-CM | POA: Diagnosis present

## 2018-08-02 DIAGNOSIS — G9340 Encephalopathy, unspecified: Secondary | ICD-10-CM | POA: Diagnosis not present

## 2018-08-02 DIAGNOSIS — Z9911 Dependence on respirator [ventilator] status: Secondary | ICD-10-CM | POA: Diagnosis not present

## 2018-08-02 DIAGNOSIS — E872 Acidosis: Secondary | ICD-10-CM | POA: Diagnosis present

## 2018-08-02 DIAGNOSIS — E43 Unspecified severe protein-calorie malnutrition: Secondary | ICD-10-CM

## 2018-08-02 DIAGNOSIS — T1490XA Injury, unspecified, initial encounter: Secondary | ICD-10-CM | POA: Diagnosis not present

## 2018-08-02 DIAGNOSIS — Z515 Encounter for palliative care: Secondary | ICD-10-CM

## 2018-08-02 DIAGNOSIS — R402122 Coma scale, eyes open, to pain, at arrival to emergency department: Secondary | ICD-10-CM | POA: Diagnosis present

## 2018-08-02 DIAGNOSIS — D72829 Elevated white blood cell count, unspecified: Secondary | ICD-10-CM | POA: Diagnosis present

## 2018-08-02 DIAGNOSIS — R569 Unspecified convulsions: Secondary | ICD-10-CM

## 2018-08-02 DIAGNOSIS — I4901 Ventricular fibrillation: Secondary | ICD-10-CM | POA: Diagnosis present

## 2018-08-02 DIAGNOSIS — Z79899 Other long term (current) drug therapy: Secondary | ICD-10-CM

## 2018-08-02 DIAGNOSIS — I252 Old myocardial infarction: Secondary | ICD-10-CM

## 2018-08-02 DIAGNOSIS — L8990 Pressure ulcer of unspecified site, unspecified stage: Secondary | ICD-10-CM

## 2018-08-02 DIAGNOSIS — Z7189 Other specified counseling: Secondary | ICD-10-CM | POA: Diagnosis not present

## 2018-08-02 DIAGNOSIS — J9601 Acute respiratory failure with hypoxia: Secondary | ICD-10-CM

## 2018-08-02 DIAGNOSIS — Z681 Body mass index (BMI) 19 or less, adult: Secondary | ICD-10-CM | POA: Diagnosis not present

## 2018-08-02 DIAGNOSIS — D638 Anemia in other chronic diseases classified elsewhere: Secondary | ICD-10-CM | POA: Diagnosis present

## 2018-08-02 DIAGNOSIS — I110 Hypertensive heart disease with heart failure: Secondary | ICD-10-CM | POA: Diagnosis present

## 2018-08-02 DIAGNOSIS — R4182 Altered mental status, unspecified: Secondary | ICD-10-CM | POA: Diagnosis present

## 2018-08-02 LAB — COMPREHENSIVE METABOLIC PANEL
ALT: 20 U/L (ref 0–44)
AST: 123 U/L — ABNORMAL HIGH (ref 15–41)
Albumin: 3 g/dL — ABNORMAL LOW (ref 3.5–5.0)
Alkaline Phosphatase: 109 U/L (ref 38–126)
Anion gap: 24 — ABNORMAL HIGH (ref 5–15)
BUN: <5 mg/dL — ABNORMAL LOW (ref 8–23)
CO2: 14 mmol/L — ABNORMAL LOW (ref 22–32)
Calcium: 8.4 mg/dL — ABNORMAL LOW (ref 8.9–10.3)
Chloride: 96 mmol/L — ABNORMAL LOW (ref 98–111)
Creatinine, Ser: 1.36 mg/dL — ABNORMAL HIGH (ref 0.61–1.24)
GFR calc Af Amer: >60 mL/min (ref >60)
GFR calc non Af Amer: 55 mL/min — ABNORMAL LOW (ref >60)
Glucose, Bld: 82 mg/dL (ref 70–99)
Potassium: 4.3 mmol/L (ref 3.5–5.1)
Sodium: 134 mmol/L — ABNORMAL LOW (ref 135–145)
Total Bilirubin: 0.8 mg/dL (ref 0.3–1.2)
Total Protein: 6.1 g/dL — ABNORMAL LOW (ref 6.5–8.1)

## 2018-08-02 LAB — DIFFERENTIAL
Abs Immature Granulocytes: 0 10*3/uL (ref 0.00–0.07)
Basophils Absolute: 0 10*3/uL (ref 0.0–0.1)
Basophils Relative: 0 %
Eosinophils Absolute: 0 10*3/uL (ref 0.0–0.5)
Eosinophils Relative: 0 %
Lymphocytes Relative: 1 %
Lymphs Abs: 0.1 10*3/uL — ABNORMAL LOW (ref 0.7–4.0)
Monocytes Absolute: 0.8 10*3/uL (ref 0.1–1.0)
Monocytes Relative: 7 %
Neutro Abs: 10.5 10*3/uL — ABNORMAL HIGH (ref 1.7–7.7)
Neutrophils Relative %: 92 %
nRBC: 0 /100 WBC

## 2018-08-02 LAB — CBC
HCT: 36.3 % — ABNORMAL LOW (ref 39.0–52.0)
Hemoglobin: 10.8 g/dL — ABNORMAL LOW (ref 13.0–17.0)
MCH: 28.5 pg (ref 26.0–34.0)
MCHC: 29.8 g/dL — ABNORMAL LOW (ref 30.0–36.0)
MCV: 95.8 fL (ref 80.0–100.0)
Platelets: 121 10*3/uL — ABNORMAL LOW (ref 150–400)
RBC: 3.79 MIL/uL — ABNORMAL LOW (ref 4.22–5.81)
RDW: 17.4 % — ABNORMAL HIGH (ref 11.5–15.5)
WBC: 11.4 10*3/uL — ABNORMAL HIGH (ref 4.0–10.5)
nRBC: 0 % (ref 0.0–0.2)

## 2018-08-02 LAB — URINALYSIS, ROUTINE W REFLEX MICROSCOPIC
Bacteria, UA: NONE SEEN
Bilirubin Urine: NEGATIVE
Glucose, UA: NEGATIVE mg/dL
Ketones, ur: 5 mg/dL — AB
Leukocytes,Ua: NEGATIVE
Nitrite: NEGATIVE
Protein, ur: 30 mg/dL — AB
Specific Gravity, Urine: 1.017 (ref 1.005–1.030)
pH: 6 (ref 5.0–8.0)

## 2018-08-02 LAB — POCT I-STAT 7, (LYTES, BLD GAS, ICA,H+H)
Acid-base deficit: 7 mmol/L — ABNORMAL HIGH (ref 0.0–2.0)
Bicarbonate: 17.4 mmol/L — ABNORMAL LOW (ref 20.0–28.0)
Calcium, Ion: 0.9 mmol/L — ABNORMAL LOW (ref 1.15–1.40)
HCT: 32 % — ABNORMAL LOW (ref 39.0–52.0)
Hemoglobin: 10.9 g/dL — ABNORMAL LOW (ref 13.0–17.0)
O2 Saturation: 99 %
Patient temperature: 98.6
Potassium: 3.3 mmol/L — ABNORMAL LOW (ref 3.5–5.1)
Sodium: 134 mmol/L — ABNORMAL LOW (ref 135–145)
TCO2: 18 mmol/L — ABNORMAL LOW (ref 22–32)
pCO2 arterial: 30 mmHg — ABNORMAL LOW (ref 32.0–48.0)
pH, Arterial: 7.371 (ref 7.350–7.450)
pO2, Arterial: 166 mmHg — ABNORMAL HIGH (ref 83.0–108.0)

## 2018-08-02 LAB — TYPE AND SCREEN
ABO/RH(D): A NEG
Antibody Screen: NEGATIVE

## 2018-08-02 LAB — ABO/RH: ABO/RH(D): A NEG

## 2018-08-02 LAB — RAPID URINE DRUG SCREEN, HOSP PERFORMED
Amphetamines: NOT DETECTED
Barbiturates: NOT DETECTED
Benzodiazepines: NOT DETECTED
Cocaine: NOT DETECTED
Opiates: NOT DETECTED
Tetrahydrocannabinol: NOT DETECTED

## 2018-08-02 LAB — LACTIC ACID, PLASMA
Lactic Acid, Venous: 5.7 mmol/L (ref 0.5–1.9)
Lactic Acid, Venous: 5 mmol/L (ref 0.5–1.9)
Lactic Acid, Venous: 3.2 mmol/L (ref 0.5–1.9)

## 2018-08-02 LAB — TRIGLYCERIDES: Triglycerides: 131 mg/dL (ref <150)

## 2018-08-02 LAB — I-STAT CREATININE, ED: Creatinine, Ser: 1 mg/dL (ref 0.61–1.24)

## 2018-08-02 LAB — APTT: aPTT: 35 seconds (ref 24–36)

## 2018-08-02 LAB — CBG MONITORING, ED: Glucose-Capillary: 102 mg/dL — ABNORMAL HIGH (ref 70–99)

## 2018-08-02 LAB — PROTIME-INR
INR: 1.6 — ABNORMAL HIGH (ref 0.8–1.2)
Prothrombin Time: 18.8 seconds — ABNORMAL HIGH (ref 11.4–15.2)

## 2018-08-02 LAB — ETHANOL: Alcohol, Ethyl (B): <10 mg/dL (ref <10)

## 2018-08-02 LAB — MRSA PCR SCREENING: MRSA by PCR: NEGATIVE

## 2018-08-02 LAB — AMMONIA: Ammonia: 23 umol/L (ref 9–35)

## 2018-08-02 LAB — GLUCOSE, CAPILLARY: Glucose-Capillary: 62 mg/dL — ABNORMAL LOW (ref 70–99)

## 2018-08-02 MED ORDER — DEXTROSE 50 % IV SOLN: 25.0000 mL | Freq: Once | INTRAVENOUS | Status: AC

## 2018-08-02 MED ORDER — FENTANYL CITRATE (PF) 100 MCG/2ML IJ SOLN
50.0000 ug | Freq: Once | INTRAMUSCULAR | Status: AC
  Administered 2018-08-02: 50 ug via INTRAVENOUS

## 2018-08-02 MED ORDER — SODIUM CHLORIDE 0.9 % IV SOLN
100.0000 mL/h | INTRAVENOUS | Status: DC
  Administered 2018-08-02: 100 mL/h via INTRAVENOUS
  Administered 2018-08-03: 20 mL/h via INTRAVENOUS

## 2018-08-02 MED ORDER — FENTANYL 2500MCG IN NS 250ML (10MCG/ML) PREMIX INFUSION
25.0000 ug/h | INTRAVENOUS | Status: DC
  Administered 2018-08-02: 50 ug/h via INTRAVENOUS
  Filled 2018-08-02: qty 250

## 2018-08-02 MED ORDER — HEPARIN SODIUM (PORCINE) 5000 UNIT/ML IJ SOLN
5000.0000 [IU] | Freq: Three times a day (TID) | INTRAMUSCULAR | Status: DC
  Administered 2018-08-02 – 2018-08-03 (×3): 5000 [IU] via SUBCUTANEOUS
  Filled 2018-08-02 (×4): qty 1

## 2018-08-02 MED ORDER — DILTIAZEM HCL 25 MG/5ML IV SOLN
10.0000 mg | Freq: Once | INTRAVENOUS | Status: AC
  Administered 2018-08-02: 10 mg via INTRAVENOUS
  Filled 2018-08-02: qty 5

## 2018-08-02 MED ORDER — FENTANYL BOLUS VIA INFUSION
50.0000 ug | INTRAVENOUS | Status: DC | PRN
  Filled 2018-08-02: qty 50

## 2018-08-02 MED ORDER — SODIUM CHLORIDE 0.9 % IV BOLUS
1000.0000 mL | Freq: Once | INTRAVENOUS | Status: AC
  Administered 2018-08-02: 1000 mL via INTRAVENOUS

## 2018-08-02 MED ORDER — PROPOFOL 1000 MG/100ML IV EMUL
0.0000 ug/kg/min | INTRAVENOUS | Status: DC
  Administered 2018-08-02: 5 ug/kg/min via INTRAVENOUS
  Filled 2018-08-02: qty 100

## 2018-08-02 MED ORDER — IOHEXOL 350 MG/ML SOLN
50.0000 mL | Freq: Once | INTRAVENOUS | Status: AC | PRN
  Administered 2018-08-02: 50 mL via INTRAVENOUS

## 2018-08-02 MED ORDER — SODIUM CHLORIDE 0.9 % IV SOLN
2.0000 g | INTRAVENOUS | Status: DC
  Administered 2018-08-02: 2 g via INTRAVENOUS
  Filled 2018-08-02 (×2): qty 2

## 2018-08-02 MED ORDER — SODIUM CHLORIDE 0.9% FLUSH: 3.0000 mL | Freq: Once | INTRAVENOUS | Status: DC

## 2018-08-02 MED ORDER — FOLIC ACID 5 MG/ML IJ SOLN
1.0000 mg | Freq: Every day | INTRAMUSCULAR | Status: DC
  Administered 2018-08-02 – 2018-08-03 (×2): 1 mg via INTRAVENOUS
  Filled 2018-08-02 (×3): qty 0.2

## 2018-08-02 MED ORDER — ETOMIDATE 2 MG/ML IV SOLN
INTRAVENOUS | Status: AC | PRN
  Administered 2018-08-02: 15 mg via INTRAVENOUS

## 2018-08-02 MED ORDER — SODIUM CHLORIDE 0.9 % IV BOLUS
500.0000 mL | Freq: Once | INTRAVENOUS | Status: AC
  Administered 2018-08-02: 500 mL via INTRAVENOUS

## 2018-08-02 MED ORDER — ORAL CARE MOUTH RINSE
15.0000 mL | OROMUCOSAL | Status: DC
  Administered 2018-08-02 – 2018-08-03 (×4): 15 mL via OROMUCOSAL

## 2018-08-02 MED ORDER — DEXTROSE 50 % IV SOLN
INTRAVENOUS | Status: AC
  Administered 2018-08-02: 50 mL
  Filled 2018-08-02: qty 50

## 2018-08-02 MED ORDER — SODIUM CHLORIDE 0.9 % IV SOLN
2.0000 g | Freq: Two times a day (BID) | INTRAVENOUS | Status: DC
  Administered 2018-08-03 (×2): 2 g via INTRAVENOUS
  Filled 2018-08-02 (×3): qty 2

## 2018-08-02 MED ORDER — THIAMINE HCL 100 MG/ML IJ SOLN
250.0000 mg | INTRAVENOUS | Status: DC
  Administered 2018-08-02 – 2018-08-03 (×2): 250 mg via INTRAVENOUS
  Filled 2018-08-02 (×2): qty 2.5

## 2018-08-02 MED ORDER — FAMOTIDINE 20 MG PO TABS
20.0000 mg | ORAL_TABLET | Freq: Two times a day (BID) | ORAL | Status: DC
  Administered 2018-08-02: 20 mg via ORAL
  Filled 2018-08-02 (×2): qty 1

## 2018-08-02 MED ORDER — ROCURONIUM BROMIDE 50 MG/5ML IV SOLN
INTRAVENOUS | Status: AC | PRN
  Administered 2018-08-02: 60 mg via INTRAVENOUS

## 2018-08-02 MED ORDER — CHLORHEXIDINE GLUCONATE 0.12% ORAL RINSE (MEDLINE KIT)
15.0000 mL | Freq: Two times a day (BID) | OROMUCOSAL | Status: DC
  Administered 2018-08-02 – 2018-08-03 (×2): 15 mL via OROMUCOSAL

## 2018-08-02 MED ORDER — ACETAMINOPHEN 325 MG PO TABS
650.0000 mg | ORAL_TABLET | ORAL | Status: DC | PRN
  Filled 2018-08-02: qty 2

## 2018-08-02 MED ORDER — SODIUM CHLORIDE 0.9 % IV SOLN
INTRAVENOUS | Status: DC | PRN
  Administered 2018-08-02: 250 mL via INTRAVENOUS

## 2018-08-02 NOTE — Consult Note (Signed)
NAME:  Caleb Carpenter, MRN:  [id number], DOB:  1956-01-12, LOS: 0 ADMISSION DATE:  08/10/2018, CONSULTATION DATE:  08/10/2018  REFERRING MD: Dr. Lynelle Doctor, CHIEF COMPLAINT: Intubated, unresponsive  Brief History   63 year old gentleman, past medical history of chronic alcohol abuse.  Patient was brought via EMS initially as a code stroke found minimally responsive at home with a GCS of 8.  He has a history of alcohol abuse.  Patient was initially seen by our stroke team here in the ED.  Patient has been admitted multiple times in the past.  Prior durable DNR status dating back till 2019.  Last seen by palliative care in March 2020.  Last consultation at that time also deemed as a durable DNR with recommendations for discharge home to hospice.  History of present illness   63 year old gentleman past medical history of COPD, hypertension, non-STEMI, ischemic cardiomyopathy, EF 25%, chronic alcohol abuse.  Patient was found unresponsive by roommate.  Patient lives with a man named Caleb Carpenter.  I was unable to speak with Caleb Carpenter regarding his history of today.  I did speak with his brother as well as his Sister Caleb Carpenter.  All of the other history was obtained via medical record.  The patient sister did state that he was a DNR and would not want to be placed on any life support machines.  Patient's initial stroke work-up was completed on admission due to code stroke evaluation.  Past Medical History   Past Medical History:  Diagnosis Date  . Asthma   . COPD (chronic obstructive pulmonary disease) (HCC)   . Hypertension   . 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 NSTEMI (non-ST elevated myocardial infarction) (HCC)      Significant Hospital Events   4/19: ETT  Consults:  4/19: PCCM  Procedures:  EEG: Pending   Significant Diagnostic Tests:   CT Head and Neck: IMPRESSION: 1. 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 2. Heavily calcified plaque at the left carotid bifurcation with 65% proximal ICA stenosis. 3. No significant right-sided carotid artery stenosis. 4. Intracranial atherosclerosis without major branch occlusion or significant proximal stenosis. 5. Abnormal appearance of contrast in the aortic arch and included portion of the proximal descending aorta. This is suggestive of contrast mixing, possibly related to diminished cardiac output and heavily diseased aorta more distally as seen on 04/30/2017 chest CT. A discrete dissection flap is not evident. However, if there is clinical concern for acute aortic pathology, recommend CTA chest, abdomen, and pelvis. 6. Aortic Atherosclerosis (ICD10-I70.0) and Emphysema (ICD10-J43.9).   Micro Data:  BCx: NGTD   Antimicrobials:  Cefepime    Interim history/subjective:  Intubated on mechanical ventilation, unresponsive  Objective   Blood pressure 106/88, pulse (!) 131, resp. rate 20, height  (1.651 m), weight 43 kg, SpO2 100 %.    Vent Mode: PRVC FiO2 (%):  [40 %] 40 % Set Rate:  [15 bmp] 15 bmp Vt Set:  [490 mL] 490 mL PEEP:  [5 cmH20] 5 cmH20 Plateau Pressure:  [17 cmH20] 17 cmH20   Intake/Output Summary (Last 24 hours) at 07/21/2018 1635 Last data filed at 07/31/2018 1512 Gross per 24 hour  Intake 571.41 ml  Output -  Net 571.41 ml   Filed Weights   08/05/2018 1300  Weight: 43 kg    Examination: General: Chronically ill-appearing, emaciated male, intubated on mechanical ventilation HENT: NCAT, masseter muscle wasting, temporalis muscle wasting Lungs: Bilateral vented breath  sounds Cardiovascular: Tachycardic, regular, sinus telemetry reviewed Abdomen: Flat, thin, bowel sounds present, nontender nondistended Extremities: Thin, muscle wasting, scattered bruising Neuro: Sedated on mechanical ventilation, on propofol, examined after intubation with paralysis GU: Foley in place  Resolved Hospital Problem list      Assessment & Plan:   Acute hypoxemic respiratory failure secondary to the inability to protect his airway from acute metabolic encephalopathy Patient is a known alcohol abuser. Chest x-ray: Reviewed evidence of emphysema no obvious infiltrate The patient's images have been independently reviewed by me.   - Initially presented as code stroke evaluation.  Please see documentation by neurology colleagues. - Raises possibility of concern for seizure-like activity related to alcoholism. -Clinically I suspect a toxidrome versus seizure - EEG pending  - UDS pending   Lactic acidosis - Possibly related to seizure  Elevated leukocytosis, lactic acidosis, possible sepsis -Unclear etiology, does have altered mental status -We will start cefepime empirically  Chronic alcohol abuse - Continue thiamine folate  Chronic systolic heart failure Heart failure with reduced ejection fraction Prior ejection fraction 25% History of non-STEMI -Continue supportive care -Attempt to maintain euvolemia  Left Vertebral Artery Occlusion   Hyponatremia  - likely from malnutrition and alcohol use   Severe protein calorie malnutrition  - support care   Elevated AST  - likely from alcohol use   Goals of care: Patient with prior wishes to be a durable DNR with DNI.  Please see documentation from our palliative care services in 2019 as well as 2020.  He was last seen in March 2020.  I called and spoke with the family regarding his previous wishes and his current status of being intubated in the emergency room.  At this point I do believe that we should let the proverbial dust settle and then allow to see if we can find a etiology of his altered mental status.  We will admit him to the intensive care unit, remain CODE STATUS as a DNR.  If he looks as if he is unable to wean from the ventilator then as more information returns from lab work and EEG results we can make an informed decision about removing him  from mechanical ventilator for full comfort care measures.  I have discussed this with our palliative care services.  We appreciate their recommendation.  Best practice:  Diet: NPO Pain/Anxiety/Delirium protocol (if indicated): Yes VAP protocol (if indicated): Yes DVT prophylaxis: heparin  GI prophylaxis: H2B  Glucose control: CBGs Mobility: BR Code Status: DNR Family Communication: updated family Disposition: ICU   Labs   CBC: Recent Labs  Lab August 09, 2018 1327 09-Aug-2018 1532  WBC 11.4*  --   NEUTROABS 10.5*  --   HGB 10.8* 10.9*  HCT 36.3* 32.0*  MCV 95.8  --   PLT 121*  --     Basic Metabolic Panel: Recent Labs  Lab 08-09-18 1327 08-09-18 1329 08/09/18 1532  NA 134*  --  134*  K 4.3  --  3.3*  CL 96*  --   --   CO2 14*  --   --   GLUCOSE 82  --   --   BUN <5*  --   --   CREATININE 1.36* 1.00  --   CALCIUM 8.4*  --   --    GFR: Estimated Creatinine Clearance: 45.4 mL/min (by C-G formula based on SCr of 1 mg/dL). Recent Labs  Lab 08/09/18 1327  WBC 11.4*    Liver Function Tests: Recent Labs  Lab 08-09-2018  1327  AST 123*  ALT 20  ALKPHOS 109  BILITOT 0.8  PROT 6.1*  ALBUMIN 3.0*   No results for input(s): LIPASE, AMYLASE in the last 168 hours. Recent Labs  Lab 07/31/2018 1440  AMMONIA 23    ABG    Component Value Date/Time   PHART 7.371 08/06/2018 1532   PCO2ART 30.0 (L) 08/01/2018 1532   PO2ART 166.0 (H) 08/01/2018 1532   HCO3 17.4 (L) 07/25/2018 1532   TCO2 18 (L) 07/25/2018 1532   ACIDBASEDEF 7.0 (H) 07/17/2018 1532   O2SAT 99.0 08/12/2018 1532     Coagulation Profile: Recent Labs  Lab 07/17/2018 1327  INR 1.6*    Cardiac Enzymes: No results for input(s): CKTOTAL, CKMB, CKMBINDEX, TROPONINI in the last 168 hours.  HbA1C: Hgb A1c MFr Bld  Date/Time Value Ref Range Status  04/01/2017 12:43 PM 4.8 4.8 - 5.6 % Final    Comment:    (NOTE)         Prediabetes: 5.7 - 6.4         Diabetes: >6.4         Glycemic control for adults  with diabetes: <7.0   10/12/2016 04:53 AM 5.3 4.8 - 5.6 % Final    Comment:    (NOTE)         Pre-diabetes: 5.7 - 6.4         Diabetes: >6.4         Glycemic control for adults with diabetes: <7.0     CBG: Recent Labs  Lab 08/01/2018 1320  GLUCAP 102*    Review of Systems:   Unable to be obtained secondary to critical illness  Past Medical History  He,  has a past medical history of Asthma, COPD (chronic obstructive pulmonary disease) (HCC), Hypertension, Hypertensive heart disease with chronic systolic congestive heart failure (HCC) (11/22/2017), and NSTEMI (non-ST elevated myocardial infarction) (HCC).   Surgical History    Past Surgical History:  Procedure Laterality Date  . CATARACT EXTRACTION, BILATERAL Bilateral   . ESOPHAGOGASTRODUODENOSCOPY (EGD) WITH PROPOFOL N/A 06/21/2015   Procedure: ESOPHAGOGASTRODUODENOSCOPY (EGD) WITH PROPOFOL;  Surgeon: Midge Miniumarren Wohl, MD;  Location: [facility] ENDOSCOPY;  Service: Endoscopy;  Laterality: N/A;  . LEFT HEART CATH AND CORONARY ANGIOGRAPHY N/A 10/28/2017   Procedure: LEFT HEART CATH AND CORONARY ANGIOGRAPHY;  Surgeon: SwazilandJordan, Peter M, MD;  [facility];  Service: Cardiovascular;  Laterality: N/A;     Social History   reports that he quit smoking about 4 years ago. His smoking use included cigarettes. His smokeless tobacco use includes chew. He reports current alcohol use of about 2.0 standard drinks of alcohol per week. He reports that he does not use drugs.   Family History   His family history includes Diabetes in an other family member; Hypertension in an other family member.   Allergies Allergies  Allergen Reactions  . Codeine Other (See Comments)    ams     Home Medications  Prior to Admission medications   Medication Sig Start Date End Date Taking? Authorizing Provider  albuterol (PROVENTIL HFA;VENTOLIN HFA) 108 (90 Base) MCG/ACT inhaler Inhale 2 puffs into the lungs every 4 (four) hours as needed for wheezing.      [provider]  pantoprazole (PROTONIX) 40 MG tablet Take 40 mg by mouth daily.    [provider]  rosuvastatin (CRESTOR) 10 MG tablet Take 10 mg by mouth daily.    [provider]  Tiotropium Bromide-Olodaterol (STIOLTO RESPIMAT) 2.5-2.5 MCG/ACT AERS Inhale  2 puffs into the lungs daily.    [provider]   This patient is critically ill with multiple organ system failure; which, requires frequent high complexity decision making, assessment, support, evaluation, and titration of therapies. This was completed through the application of advanced monitoring technologies and extensive interpretation of multiple databases. During this encounter critical care time was devoted to patient care services described in this note for 45 minutes.   Josephine Igo, DO [facility] 2018-08-08 5:26 PM  Personal pager: [phone] If unanswered, please page CCM On-call: [phone]

## 2018-08-02 NOTE — Progress Notes (Signed)
Patient intubated for airway protection due to AMS, good color change on ETCO2 detector, good BBS ausculted, SATS 100%.

## 2018-08-02 NOTE — Progress Notes (Signed)
EEG completed, results pending. 

## 2018-08-02 NOTE — ED Notes (Signed)
Pt niece Candi Leash [phone]

## 2018-08-02 NOTE — Progress Notes (Signed)
Pharmacy Antibiotic Note  Caleb Carpenter is a 63 y.o. male admitted on 08/08/2018 with sepsis.  Pharmacy has been consulted for cefepime dosing. SCr 1, CrCl ~ 45 ml/min  Plan: Cefepime 2g IV every 12 hours Monitor renal function, Cx and clinical progression to narrow  Height: 5\' 5"  (165.1 cm) Weight: 94 lb 12.8 oz (43 kg)(per previous visit) IBW/kg (Calculated) : 61.5  No data recorded.  Recent Labs  Lab 08/12/2018 1327 07/26/2018 1329 08/13/2018 1615  WBC 11.4*  --   --   CREATININE 1.36* 1.00  --   LATICACIDVEN  --   --  5.7*    Estimated Creatinine Clearance: 45.4 mL/min (by C-G formula based on SCr of 1 mg/dL).    Allergies  Allergen Reactions  . Codeine Other (See Comments)    ams    Antimicrobials this admission: Cefepime 4/19>>  Dose adjustments this admission: n/a  Microbiology results: 4/19 BCx: sent  Daylene Posey, PharmD Clinical Pharmacist Please check AMION for all [facility] Pharmacy numbers 07/25/2018 5:14 PM

## 2018-08-02 NOTE — Procedures (Signed)
History: 63 year old male presenting with encephalopathy  Sedation: None  Technique: This is a 21 channel routine scalp EEG performed at the bedside with bipolar and monopolar montages arranged in accordance to the international 10/20 system of electrode placement. One channel was dedicated to EKG recording.    Background: The background consists of generalized irregular delta range activity with some superimposed anteriorly predominant smoothly contoured beta.  There are occasional poorly formed frontally predominant discharges with triphasic morphology which are isolated, not periodic.  Photic stimulation: Physiologic driving is not performed  EEG Abnormalities: 1) occasional triphasic waves 2) generalized irregular slow activity 3) absent PDR  Clinical Interpretation: This EEG is consistent with a generalized nonspecific cerebral dysfunction (encephalopathy). There was no seizure or seizure predisposition recorded on this study. Please note that lack of epileptiform activity on EEG does not preclude the possibility of epilepsy.   Ritta Slot, MD [facility] [phone]  If 7pm- 7am, please page neurology on call as listed in AMION.

## 2018-08-02 NOTE — Consult Note (Signed)
Consultation Note Date: 08/23/2018   Patient Name: Caleb Carpenter  DOB: [birth year]  MRN: [id number]  Age / Sex: 63 y.o., male  PCP: Evelene Croon, MD Referring Physician: Josephine Igo, DO  Reason for Consultation: Establishing goals of care, Psychosocial/spiritual support, Terminal Care and Withdrawal of life-sustaining treatment  HPI/Patient Profile: 63 y.o. male  with past medical history of ischemic cardiomyopathy, systolic heart failure with an EF of 20 to 25%, alcohol use disorder, dementia, history of non-STEMI July 2019 admitted on 08/23/2018 after being found down by his roommate.  EMS was called.  Patient was unable to follow commands, unresponsive with agonal respirations.  He was intubated in the emergency room to protect his airway  Consult ordered for goals of care and potentially compassionate extubation.  Patient has been seen by palliative medicine services in the past.  His goals of care were DNR/DNI  Clinical Assessment and Goals of Care: Patient seen, chart reviewed.  Staffed with critical care medicine, Dr. Tonia Brooms.  Per chart review, patient's roommate Bethann Berkshire had gone to church and when he came back he found Baldo Ash on the floor unresponsive.  EMS was notified.  Code stroke was called and neurology has been consulted.  He was intubated in the emergency room to protect his airway.  Patient is a DNR DNI  His sister, [name] is his healthcare proxy.  When Dr. Tonia Brooms and myself talked to Hilda Lias she verbalized again that Frantz would not want to live on life support in a debilitated state, "just letting go".  Even though [place] is a DNR/DNI plan was formulated to admit Barett to the ICU, watchful waiting overnight to see if he can wean on his own, if not plan will be for compassionate one-way extubation.  His sister Ms. Atkins was in agreement with this plan.  Patient is unable to speak for himself.   His healthcare proxy is his sister.  Abbey Chatters at [phone]    SUMMARY OF RECOMMENDATIONS   DNR Watchful waiting overnight.  We will see if patient is able to wean on his own if not compassionate extubation is likely.  Patient's healthcare POA is in agreement with this plan.  Previously, end-of-life care plans entertained were admission to residential hospice in [facility] Do not escalate care: No pressors, no CPR or defibrillation Monitor for signs and symptoms of withdrawal.  Institute CIWA protocol Patient is not full comfort care Code Status/Advance Care Planning:  DNR   Palliative Prophylaxis:   Aspiration, Bowel Regimen, Delirium Protocol, Eye Care, Frequent Pain Assessment, Oral Care and Turn Reposition  Additional Recommendations (Limitations, Scope, Preferences):  No Artificial Feeding, No Chemotherapy, No Hemodialysis and No Radiation  Psycho-social/Spiritual:   Desire for further Chaplaincy support:no   Prognosis:   Unable to determine  Discharge Planning: To Be Determined      Primary Diagnoses: Present on Admission: . Acute encephalopathy   I have reviewed the medical record, interviewed the patient and family, and examined the patient. The following aspects are pertinent.  Past  Medical History:  Diagnosis Date  . Asthma   . COPD (chronic obstructive pulmonary disease) (HCC)   . Hypertension   . 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 NSTEMI (non-ST elevated myocardial infarction) [facility])    Social History   Socioeconomic History  . Marital status: Legally Separated    Spouse name: Not on file  . Number of children: Not on file  . Years of education: 6  . Highest education level: 6th grade  Occupational History  . Occupation: retired  Engineer, production  . Financial resource strain: Very hard  . Food insecurity:    Worry: Often true    Inability: Often true  . Transportation needs:     Medical: No    Non-medical: No  Tobacco Use  . Smoking status: Former Smoker    Types: Cigarettes    Last attempt to quit: 06/21/2014    Years since quitting: 4.1  . Smokeless tobacco: Current User    Types: Chew  Substance and Sexual Activity  . Alcohol use: Yes    Alcohol/week: 2.0 standard drinks    Types: 2 Cans of beer per week    Comment: per day  . Drug use: No  . Sexual activity: Never  Lifestyle  . Physical activity:    Days per week: Not on file    Minutes per session: Not on file  . Stress: Not on file  Relationships  . Social connections:    Talks on phone: Not on file    Gets together: Not on file    Attends religious service: Not on file    Active member of club or organization: Not on file    Attends meetings of clubs or organizations: Not on file    Relationship status: Not on file  Other Topics Concern  . Not on file  Social History Narrative  . Not on file   Family History  Problem Relation Age of Onset  . Diabetes Other   . Hypertension Other    Scheduled Meds: . famotidine  20 mg Oral BID  . heparin  5,000 Units Subcutaneous Q8H  . sodium chloride flush  3 mL Intravenous Once   Continuous Infusions: . sodium chloride 100 mL/hr (07/24/2018 1512)  . fentaNYL infusion INTRAVENOUS 50 mcg/hr (07/20/2018 1512)  . propofol (DIPRIVAN) infusion 5 mcg/kg/min (07/15/2018 1512)   PRN Meds:.acetaminophen, fentaNYL Medications Prior to Admission:  Prior to Admission medications   Medication Sig Start Date End Date Taking? Authorizing Provider  albuterol (PROVENTIL HFA;VENTOLIN HFA) 108 (90 Base) MCG/ACT inhaler Inhale 2 puffs into the lungs every 4 (four) hours as needed for wheezing.     [provider]  pantoprazole (PROTONIX) 40 MG tablet Take 40 mg by mouth daily.    [provider]  rosuvastatin (CRESTOR) 10 MG tablet Take 10 mg by mouth daily.    [provider]  Tiotropium Bromide-Olodaterol (STIOLTO RESPIMAT) 2.5-2.5 MCG/ACT  AERS Inhale 2 puffs into the lungs daily.    [provider]   Allergies  Allergen Reactions  . Codeine Other (See Comments)    ams   Review of Systems  Unable to perform ROS: Intubated    Physical Exam Vitals signs and nursing note reviewed.  Constitutional:      Appearance: He is ill-appearing.     Comments: Cachectic, acutely ill appearing older man He is intubated  HENT:     Head: Normocephalic and atraumatic.  Cardiovascular:  Rate and Rhythm: Tachycardia present.  Pulmonary:     Comments: Patient is intubated    Vital Signs: BP (!) 133/96   Pulse (!) 131   Resp 15   Ht 5\' 5"  (1.651 m)   Wt 43 kg Comment: per previous visit  SpO2 100%   BMI 15.78 kg/m          SpO2: SpO2: 100 % O2 Device:SpO2: 100 % O2 Flow Rate: .   IO: Intake/output summary:   Intake/Output Summary (Last 24 hours) at 08/10/2018 1657 Last data filed at 07/19/2018 1652 Gross per 24 hour  Intake 2071.41 ml  Output -  Net 2071.41 ml    LBM:   Baseline Weight: Weight: 43 kg(per previous visit) Most recent weight: Weight: 43 kg(per previous visit)     Palliative Assessment/Data:   Flowsheet Rows     Most Recent Value  Intake Tab  Referral Department  Critical care  Unit at Time of Referral  ICU  Palliative Care Primary Diagnosis  Other (Comment)  Date Notified  07/27/2018  Palliative Care Type  Return patient Palliative Care  Reason for referral  Clarify Goals of Care, Psychosocial or Spiritual support  Date of Admission  08/07/2018  Date first seen by Palliative Care  07/17/2018  # of days Palliative referral response time  0 Day(s)  # of days IP prior to Palliative referral  0  Clinical Assessment  Palliative Performance Scale Score  30%  Pain Max last 24 hours  Not able to report  Pain Min Last 24 hours  Not able to report  Dyspnea Max Last 24 Hours  Not able to report  Dyspnea Min Last 24 hours  Not able to report  Nausea Max Last 24 Hours  Not able to report   Nausea Min Last 24 Hours  Not able to report  Anxiety Max Last 24 Hours  Not able to report  Anxiety Min Last 24 Hours  Not able to report  Other Max Last 24 Hours  Not able to report  Psychosocial & Spiritual Assessment  Palliative Care Outcomes  Patient/Family meeting held?  Yes  Who was at the meeting?  pt's sister via phone,  pt intubated  Palliative Care Outcomes  Clarified goals of care, Provided psychosocial or spiritual support  Palliative Care follow-up planned  Yes, Facility      Time In: 1600 Time Out: 1710 Time Total: 70 min Greater than 50%  of this time was spent counseling and coordinating care related to the above assessment and plan. Staffed with PCCM Dr. Tonia Brooms, ED RN  Signed by: Irean Hong, NP   Please contact Palliative Medicine Team phone at [phone] for questions and concerns.  For individual provider: See Loretha Stapler

## 2018-08-02 NOTE — ED Notes (Signed)
ED TO INPATIENT HANDOFF REPORT  ED Nurse Name and Phone #:   S Name/Age/Gender Caleb Carpenter 63 y.o. male Room/Bed: TRACC/TRACC  Code Status   Code Status: DNR  Home/SNF/Other   Is this baseline? no  Triage Complete: Triage complete  Chief Complaint Code Stroke/Unresponsive  Triage Note Pt brought in by [facility] from home as a Code Stroke- pt LVO positive on EMS stroke scale. Pt LNW 0700 this am, found at 1000 in bed by roommate unresponsive, GCS of 8. Pt responding only to pain. Pt has hx of ETOH abuse. Pt moving all extremities voluntarily, but not able to follow commands.   Allergies Allergies  Allergen Reactions  . Codeine Other (See Comments)    ams    Level of Care/Admitting Diagnosis ED Disposition    ED Disposition Condition Comment   Admit  Hospital Area: [facility] [[id number]]  Level of Care: ICU [6]  Covid Evaluation: N/A  Diagnosis: Acute encephalopathy [161096]  Admitting Physician: Josephine Igo [[id number]]  Attending Physician: Josephine Igo [[id number]]  Estimated length of stay: > 1 week  Certification:: I certify this patient will need inpatient services for at least 2 midnights  PT Class (Do Not Modify): Inpatient [101]  PT Acc Code (Do Not Modify): Private [1]       B Medical/Surgery History Past Medical History:  Diagnosis Date  . Asthma   . COPD (chronic obstructive pulmonary disease) (HCC)   . Hypertension   . 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 NSTEMI (non-ST elevated myocardial infarction) [facility])    Past Surgical History:  Procedure Laterality Date  . CATARACT EXTRACTION, BILATERAL Bilateral   . ESOPHAGOGASTRODUODENOSCOPY (EGD) WITH PROPOFOL N/A 06/21/2015   Procedure: ESOPHAGOGASTRODUODENOSCOPY (EGD) WITH PROPOFOL;  Surgeon: Midge Minium, MD;  Location: [facility] ENDOSCOPY;  Service: Endoscopy;  Laterality: N/A;  . LEFT HEART CATH AND CORONARY ANGIOGRAPHY N/A 10/28/2017    Procedure: LEFT HEART CATH AND CORONARY ANGIOGRAPHY;  Surgeon: Swazil[place], Peter M, MD;  Location: [facility] INVASIVE CV LAB;  Service: Cardiovascular;  Laterality: N/A;     A IV Location/Drains/Wounds Patient Lines/Drains/Airways Status   Active Line/Drains/Airways    Name:   Placement date:   Placement time:   Site:   Days:   Peripheral IV 06/29/18 Left Hand   06/29/18    -    Hand   34   Peripheral IV 07/21/2018 Left Wrist   07/30/2018    1358    Wrist   less than 1   Peripheral IV 07/19/2018 Right Wrist   07/28/2018    1344    Wrist   less than 1   Peripheral IV 07/23/2018 Right Forearm   07/19/2018    1436    Forearm   less than 1   NG/OG Tube Orogastric 14 Fr. Center mouth Xray   07/30/2018    1349    Center mouth   less than 1   Airway 7.5 mm   07/24/2018    1345     less than 1   Pressure Injury 10/28/17 Stage I -  Intact skin with non-blanchable redness of a localized area usually over a bony prominence. intact, flaky dry skin present    10/28/17    1345     278          Intake/Output Last 24 hours  Intake/Output Summary (Last 24 hours) at 07/19/2018 1646 Last data filed at 07/17/2018 1512 Gross per 24  hour  Intake 571.41 ml  Output -  Net 571.41 ml    Labs/Imaging Results for orders placed or performed during the hospital encounter of Aug 15, 2018 (from the past 48 hour(s))  CBG monitoring, ED     Status: Abnormal   Collection Time: 08-15-18  1:20 PM  Result Value Ref Range   Glucose-Capillary 102 (H) 70 - 99 mg/dL  Protime-INR     Status: Abnormal   Collection Time: 15-Aug-2018  1:27 PM  Result Value Ref Range   Prothrombin Time 18.8 (H) 11.4 - 15.2 seconds   INR 1.6 (H) 0.8 - 1.2    Comment: (NOTE) INR goal varies based on device and disease states. Performed at [facility], [address] [place], [place] [address]   APTT     Status: None   Collection Time: 08/15/2018  1:27 PM  Result Value Ref Range   aPTT 35 24 - 36 seconds    Comment: Performed at [facility], [address] [place], [place] [address]  CBC     Status: Abnormal   Collection Time: 08/15/2018  1:27 PM  Result Value Ref Range   WBC 11.4 (H) 4.0 - 10.5 K/uL   RBC 3.79 (L) 4.22 - 5.81 MIL/uL   Hemoglobin 10.8 (L) 13.0 - 17.0 g/dL   HCT 54.6 (L) 56.8 - 12.7 %   MCV 95.8 80.0 - 100.0 fL   MCH 28.5 26.0 - 34.0 pg   MCHC 29.8 (L) 30.0 - 36.0 g/dL   RDW 51.7 (H) 00.1 - 74.9 %   Platelets 121 (L) 150 - 400 K/uL   nRBC 0.0 0.0 - 0.2 %    Comment: Performed at [facility], [address], [place], [place] [address]  Differential     Status: Abnormal   Collection Time: 08/15/18  1:27 PM  Result Value Ref Range   Neutrophils Relative % 92 %   Neutro Abs 10.5 (H) 1.7 - 7.7 K/uL   Lymphocytes Relative 1 %   Lymphs Abs 0.1 (L) 0.7 - 4.0 K/uL   Monocytes Relative 7 %   Monocytes Absolute 0.8 0.1 - 1.0 K/uL   Eosinophils Relative 0 %   Eosinophils Absolute 0.0 0.0 - 0.5 K/uL   Basophils Relative 0 %   Basophils Absolute 0.0 0.0 - 0.1 K/uL   WBC Morphology See Note     Comment: Vaculated Neutrophils   nRBC 0 0 /100 WBC   Abs Immature Granulocytes 0.00 0.00 - 0.07 K/uL   Burr Cells PRESENT    Polychromasia PRESENT     Comment: Performed at [facility], [address], [place], [place] [address]  Comprehensive metabolic panel     Status: Abnormal   Collection Time: 15-Aug-2018  1:27 PM  Result Value Ref Range   Sodium 134 (L) 135 - 145 mmol/L   Potassium 4.3 3.5 - 5.1 mmol/L   Chloride 96 (L) 98 - 111 mmol/L   CO2 14 (L) 22 - 32 mmol/L   Glucose, Bld 82 70 - 99 mg/dL   BUN <5 (L) 8 - 23 mg/dL   Creatinine, Ser 8.46 (H) 0.61 - 1.24 mg/dL   Calcium 8.4 (L) 8.9 - 10.3 mg/dL   Total Protein 6.1 (L) 6.5 - 8.1 g/dL   Albumin 3.0 (L) 3.5 - 5.0 g/dL   AST 659 (H) 15 - 41 U/L   ALT 20 0 - 44 U/L   Alkaline Phosphatase 109 38 - 126  U/L   Total Bilirubin 0.8 0.3 - 1.2 mg/dL   GFR calc non Af Amer 55 (L) >60 mL/min   GFR calc Af Amer >60 >60 mL/min   Anion gap 24 (H) 5 - 15    Comment:  Performed at [facility], [address] [place], [place] [address]  I-stat Creatinine, ED     Status: None   Collection Time: 08/09/2018  1:29 PM  Result Value Ref Range   Creatinine, Ser 1.00 0.61 - 1.24 mg/dL  Ethanol     Status: None   Collection Time: 07/20/2018  2:07 PM  Result Value Ref Range   Alcohol, Ethyl (B) <10 <10 mg/dL    Comment: (NOTE) Lowest detectable limit for serum alcohol is 10 mg/dL. For medical purposes only. Performed at [facility], [address], [place], [place] [address]   Triglycerides     Status: None   Collection Time: 07/22/2018  2:07 PM  Result Value Ref Range   Triglycerides 131 <150 mg/dL    Comment: Performed at [facility], [address] [place], [place] [address]  Ammonia     Status: None   Collection Time: 07/29/2018  2:40 PM  Result Value Ref Range   Ammonia 23 9 - 35 umol/L    Comment: Performed at [facility], [address], [place], [place] [address]  Type and screen [facility]     Status: None   Collection Time: 07/22/2018  2:40 PM  Result Value Ref Range   ABO/RH(D) A NEG    Antibody Screen NEG    Sample Expiration      08/05/2018 Performed at [facility], [address] [place], [place] [address]   ABO/Rh     Status: None (Preliminary result)   Collection Time: 07/20/2018  2:40 PM  Result Value Ref Range   ABO/RH(D)      A NEG Performed at [facility], [address], [place], [place] [address]   I-STAT 7, (LYTES, BLD GAS, ICA, H+H)     Status: Abnormal   Collection Time: 08/12/2018  3:32 PM  Result Value Ref Range   pH, Arterial 7.371 7.350 - 7.450   pCO2 arterial 30.0 (L) 32.0 - 48.0 mmHg   pO2, Arterial 166.0 (H) 83.0 - 108.0 mmHg   Bicarbonate 17.4 (L) 20.0 - 28.0 mmol/L   TCO2 18 (L) 22 - 32 mmol/L   O2 Saturation 99.0 %   Acid-base deficit 7.0 (H) 0.0 - 2.0 mmol/L   Sodium 134 (L) 135 - 145 mmol/L   Potassium 3.3 (L) 3.5 - 5.1 mmol/L   Calcium, Ion 0.90 (L)  1.15 - 1.40 mmol/L   HCT 32.0 (L) 39.0 - 52.0 %   Hemoglobin 10.9 (L) 13.0 - 17.0 g/dL   Patient temperature 57.8 F    Collection site RADIAL, ALLEN'S TEST ACCEPTABLE    Drawn by Operator    Sample type ARTERIAL    Ct Angio Head W Or Wo Contrast  Result Date: 07/26/2018 CLINICAL DATA:  Code stroke. Unresponsive. Moving all 4 extremities but not following commands. EXAM: CT ANGIOGRAPHY HEAD AND NECK TECHNIQUE: Multidetector CT imaging of the head and neck was performed using the standard protocol during bolus administration of intravenous contrast. Multiplanar CT image reconstructions and MIPs were obtained to evaluate the vascular anatomy. Carotid stenosis measurements (when applicable) are obtained utilizing NASCET criteria, using the distal internal carotid diameter as the denominator.  CONTRAST:  50mL OMNIPAQUE IOHEXOL 350 MG/ML SOLN COMPARISON:  Carotid Doppler ultrasound 10/11/2016. Chest CT 04/30/2017. FINDINGS: CTA NECK FINDINGS Aortic arch: There is a standard 3 vessel aortic arch with moderate atherosclerotic plaque. There is heterogeneous opacification of the included proximal descending thoracic aorta and to a lesser extent aortic arch with an appearance suggesting contrast mixing with unopacified blood. On some images, this mildly resembles an aortic dissection, however an intimal flap is not discretely seen. Prominent calcified and soft plaque in the distal left subclavian artery results in severe stenosis. Right carotid system: Patent with moderate, predominantly calcified plaque at the carotid bifurcation. No evidence of significant stenosis or dissection. Left carotid system: Patent with extensive, heavily calcified plaque resulting in 65% proximal ICA stenosis and 60% distal common carotid artery stenosis. Nonstenotic calcified plaque at the common carotid artery origin. Vertebral arteries: The left vertebral artery is occluded at its origin with distal reconstitution beginning at the C4  level. There is irregularity of the reconstituted distal V2 and V3 segments with a moderate stenosis noted at C2. The proximal right V1 segment including its origin is obscured by dense venous contrast. The remainder of the right vertebral artery is patent in the neck without evidence of dissection or significant stenosis. Skeleton: Detail cervical spine assessment is reported separately. No suspicious osseous lesion. Other neck: No mass or enlarged lymph nodes. Upper chest: Endotracheal tube terminates above the carina. Enteric tube is partially visualized. Mild centrilobular and paraseptal emphysema. Review of the MIP images confirms the above findings CTA HEAD FINDINGS Anterior circulation: The internal carotid arteries are patent from skull base to carotid termini with atherosclerosis bilaterally not resulting in significant stenosis. ACAs and MCAs are patent without evidence of proximal branch occlusion or significant proximal stenosis. No aneurysm is identified. Posterior circulation: The intracranial vertebral arteries are patent with calcified plaque on the right resulting in at most mild stenosis. Patent AICAs and SCAs are seen bilaterally. The basilar artery is widely patent. There is a patent left posterior communicating artery. Both PCAs are patent without evidence of significant proximal stenosis. No aneurysm is identified. Venous sinuses: Patent. Anatomic variants: None. Review of the MIP images confirms the above findings IMPRESSION: 1. 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 2. Heavily calcified plaque at the left carotid bifurcation with 65% proximal ICA stenosis. 3. No significant right-sided carotid artery stenosis. 4. Intracranial atherosclerosis without major branch occlusion or significant proximal stenosis. 5. Abnormal appearance of contrast in the aortic arch and included portion of the proximal descending aorta. This is suggestive of contrast mixing,  possibly related to diminished cardiac output and heavily diseased aorta more distally as seen on 04/30/2017 chest CT. A discrete dissection flap is not evident. However, if there is clinical concern for acute aortic pathology, recommend CTA chest, abdomen, and pelvis. 6. Aortic Atherosclerosis (ICD10-I70.0) and Emphysema (ICD10-J43.9). These results were called by telephone at the time of interpretation on 08-20-2018 at 2:38 pm to Dr. Pearlean Brownie, who verbally acknowledged these results. Electronically Signed   By: Sebastian Ache M.D.   On: 20-Aug-2018 15:07   Ct Angio Neck W Or Wo Contrast  Result Date: 08/20/18 CLINICAL DATA:  Code stroke. Unresponsive. Moving all 4 extremities but not following commands. EXAM: CT ANGIOGRAPHY HEAD AND NECK TECHNIQUE: Multidetector CT imaging of the head and neck was performed using the standard protocol during bolus administration of intravenous contrast. Multiplanar CT image reconstructions and MIPs were obtained to evaluate the vascular anatomy. Carotid stenosis  measurements (when applicable) are obtained utilizing NASCET criteria, using the distal internal carotid diameter as the denominator. CONTRAST:  50mL OMNIPAQUE IOHEXOL 350 MG/ML SOLN COMPARISON:  Carotid Doppler ultrasound 10/11/2016. Chest CT 04/30/2017. FINDINGS: CTA NECK FINDINGS Aortic arch: There is a standard 3 vessel aortic arch with moderate atherosclerotic plaque. There is heterogeneous opacification of the included proximal descending thoracic aorta and to a lesser extent aortic arch with an appearance suggesting contrast mixing with unopacified blood. On some images, this mildly resembles an aortic dissection, however an intimal flap is not discretely seen. Prominent calcified and soft plaque in the distal left subclavian artery results in severe stenosis. Right carotid system: Patent with moderate, predominantly calcified plaque at the carotid bifurcation. No evidence of significant stenosis or dissection. Left  carotid system: Patent with extensive, heavily calcified plaque resulting in 65% proximal ICA stenosis and 60% distal common carotid artery stenosis. Nonstenotic calcified plaque at the common carotid artery origin. Vertebral arteries: The left vertebral artery is occluded at its origin with distal reconstitution beginning at the C4 level. There is irregularity of the reconstituted distal V2 and V3 segments with a moderate stenosis noted at C2. The proximal right V1 segment including its origin is obscured by dense venous contrast. The remainder of the right vertebral artery is patent in the neck without evidence of dissection or significant stenosis. Skeleton: Detail cervical spine assessment is reported separately. No suspicious osseous lesion. Other neck: No mass or enlarged lymph nodes. Upper chest: Endotracheal tube terminates above the carina. Enteric tube is partially visualized. Mild centrilobular and paraseptal emphysema. Review of the MIP images confirms the above findings CTA HEAD FINDINGS Anterior circulation: The internal carotid arteries are patent from skull base to carotid termini with atherosclerosis bilaterally not resulting in significant stenosis. ACAs and MCAs are patent without evidence of proximal branch occlusion or significant proximal stenosis. No aneurysm is identified. Posterior circulation: The intracranial vertebral arteries are patent with calcified plaque on the right resulting in at most mild stenosis. Patent AICAs and SCAs are seen bilaterally. The basilar artery is widely patent. There is a patent left posterior communicating artery. Both PCAs are patent without evidence of significant proximal stenosis. No aneurysm is identified. Venous sinuses: Patent. Anatomic variants: None. Review of the MIP images confirms the above findings IMPRESSION: 1. 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 2. Heavily calcified plaque at the left carotid bifurcation with  65% proximal ICA stenosis. 3. No significant right-sided carotid artery stenosis. 4. Intracranial atherosclerosis without major branch occlusion or significant proximal stenosis. 5. Abnormal appearance of contrast in the aortic arch and included portion of the proximal descending aorta. This is suggestive of contrast mixing, possibly related to diminished cardiac output and heavily diseased aorta more distally as seen on 04/30/2017 chest CT. A discrete dissection flap is not evident. However, if there is clinical concern for acute aortic pathology, recommend CTA chest, abdomen, and pelvis. 6. Aortic Atherosclerosis (ICD10-I70.0) and Emphysema (ICD10-J43.9). These results were called by telephone at the time of interpretation on 08/08/2018 at 2:38 pm to Dr. Pearlean Brownie, who verbally acknowledged these results. Electronically Signed   By: Sebastian Ache M.D.   On: 07/31/2018 15:07   Ct C-spine No Charge  Result Date: 08/12/2018 CLINICAL DATA:  Code stroke. Unresponsive. EXAM: CT CERVICAL SPINE WITHOUT CONTRAST TECHNIQUE: Multidetector CT imaging of the cervical spine was performed without intravenous contrast. Multiplanar CT image reconstructions were also generated. COMPARISON:  03/31/2017 FINDINGS: Alignment: Chronic straightening/slight reversal of the  normal cervical lordosis. No subluxation. Skull base and vertebrae: No acute fracture or destructive osseous process. Chronic deformity of the tips of the C7 and T1 spinous processes. Soft tissues and spinal canal: No prevertebral fluid or swelling. No visible canal hematoma. Disc levels: Mild-to-moderate multilevel cervical disc degeneration. Severe right neural foraminal stenosis at C3-4 due to uncovertebral spurring and severe right facet arthrosis. Upper chest: Centrilobular and paraseptal emphysema. Other: Partially visualized endotracheal and orogastric tubes. IMPRESSION: No evidence of acute fracture or subluxation in the cervical spine. Emphysema (ICD10-J43.9).  Electronically Signed   By: Sebastian Ache M.D.   On: 04-Aug-2018 16:02   Dg Chest Portable 1 View  Result Date: 08/05/2018 CLINICAL DATA:  Respiratory failure.  Endotracheal tube placement. EXAM: PORTABLE CHEST 1 VIEW COMPARISON:  06/26/2018 and prior radiographs FINDINGS: An endotracheal tube is identified with tip 5 cm above the carina. An NG tube is present with tip overlying the proximal stomach and side hole overlying the distal esophagus. Emphysema type changes are noted. There is no evidence of focal airspace disease, pulmonary edema, suspicious pulmonary nodule/mass, pleural effusion, or pneumothorax. No acute bony abnormalities are identified. Bilateral rib and proximal RIGHT humeral fractures are again noted. IMPRESSION: 1. Endotracheal tube with tip 5 cm above the carina. 2. NG tube with tip overlying the proximal stomach and side hole overlying the distal esophagus-recommend advancement. 3. Emphysema Electronically Signed   By: Harmon Pier M.D.   On: 07/28/2018 14:16   Ct Head Code Stroke Wo Contrast  Result Date: Aug 04, 2018 CLINICAL DATA:  Code stroke. Unresponsive. Moving all extremities though not following commands. EXAM: CT HEAD WITHOUT CONTRAST TECHNIQUE: Contiguous axial images were obtained from the base of the skull through the vertex without intravenous contrast. COMPARISON:  04/07/2018 FINDINGS: Brain: There is a new small cortical infarct in the posterior right occipital lobe of indeterminate acuity though favored to be subacute to chronic. No acute large territory infarct, intracranial hemorrhage, mass, midline shift, or extra-axial fluid collection is identified. Moderate cerebral atrophy is unchanged. Patchy cerebral white matter hypodensities are unchanged and nonspecific but compatible with mild-to-moderate chronic small vessel ischemic disease. Vascular: Calcified atherosclerosis at the skull base. No hyperdense vessel. Skull: No fracture or focal osseous lesion. Sinuses/Orbits:  Chronic right maxillary sinusitis. Small chronic bilateral mastoid effusions. Bilateral cataract extraction. Other: None. ASPECTS [facility] Stroke Program Early CT Score) Not scored with this history. IMPRESSION: 1. No evidence of acute large territory infarct or intracranial hemorrhage. 2. Small right occipital infarct, new from 03/2018. This is of indeterminate acuity though favored to be subacute to chronic. These results were called by telephone at the time of interpretation on Aug 04, 2018 at 2:38 pm to Dr. Pearlean Brownie, who verbally acknowledged these results. Electronically Signed   By: Sebastian Ache M.D.   On: 04-Aug-2018 14:46    Pending Labs Unresulted Labs (From admission, onward)    Start     Ordered   08/07/2018 0500  CBC  Tomorrow morning,   R     08/06/2018 1556   08/10/2018 0500  Basic metabolic panel  Tomorrow morning,   R     08/10/2018 1556   07/19/2018 0500  Blood gas, arterial  Tomorrow morning,   R     August 04, 2018 1556   07/21/2018 0500  Magnesium  Tomorrow morning,   R     August 04, 2018 1556   07/24/2018 0500  Phosphorus  Tomorrow morning,   R     07/30/2018 1556   August 04, 2018 1555  CBC  (  heparin)  Once,   R    Comments:  Baseline for heparin therapy IF NOT ALREADY DRAWN.  Notify MD if PLT < 100 K.    07/28/2018 1556   08/01/2018 1555  Creatinine, serum  (heparin)  Once,   R    Comments:  Baseline for heparin therapy IF NOT ALREADY DRAWN.    07/16/2018 1556   07/26/2018 1515  Blood culture (routine x 2)  BLOOD CULTURE X 2,   STAT     07/21/2018 1516   08/13/2018 1515  Lactic acid, plasma  Once,   STAT     07/27/2018 1516   07/28/2018 1351  Triglycerides  (propofol (DIPRIVAN))  Every 72 hours,   R    Comments:  While on propofol (DIPRIVAN)    08/13/2018 1350   07/15/2018 1344  Urine rapid drug screen (hosp performed)not at [facility]  ONCE - STAT,   STAT     07/31/2018 1344   07/19/2018 1344  Urinalysis, Routine w reflex microscopic (not at [facility])  ONCE - STAT,   STAT     07/16/2018 1344          Vitals/Pain Today's Vitals    08/01/2018 1524 08/12/2018 1530 08/01/2018 1600 07/27/2018 1630  BP: 106/88 (!) 138/97 (!) 138/98 (!) 123/93  Pulse: (!) 131 (!) 132 (!) 130 (!) 131  Resp: 20 20 15 15   SpO2: 100% 100% 100% 100%  Weight:      Height:        Isolation Precautions No active isolations  Medications Medications  sodium chloride flush (NS) 0.9 % injection 3 mL (3 mLs Intravenous Not Given 07/29/2018 1432)  sodium chloride 0.9 % bolus 500 mL (0 mLs Intravenous Stopped 08/01/2018 1432)    Followed by  0.9 %  sodium chloride infusion (100 mL/hr Intravenous Rate/Dose Verify 08/09/2018 1512)  fentaNYL in NS (74mcg/ml) infusion-PREMIX (50 mcg/hr Intravenous Rate/Dose Verify 07/21/2018 1512)  fentaNYL (SUBLIMAZE) bolus via infusion 50 mcg (has no administration in time range)  propofol (DIPRIVAN) 1000 MG/100ML infusion (5 mcg/kg/min  43 kg Intravenous Rate/Dose Verify 08/12/2018 1512)  heparin injection 5,000 Units (has no administration in time range)  acetaminophen (TYLENOL) tablet 650 mg (has no administration in time range)  famotidine (PEPCID) tablet 20 mg (has no administration in time range)  fentaNYL (SUBLIMAZE) injection 50 mcg (50 mcg Intravenous Given by Other 08/09/2018 1443)  iohexol (OMNIPAQUE) 350 MG/ML injection 50 mL (50 mLs Intravenous Contrast Given 08/06/2018 1419)  diltiazem (CARDIZEM) injection 10 mg (10 mg Intravenous Given 07/31/2018 1438)  etomidate (AMIDATE) injection (15 mg Intravenous Given 08/06/2018 1334)  rocuronium (ZEMURON) injection (60 mg Intravenous Given 07/15/2018 1334)  sodium chloride 0.9 % bolus 1,000 mL (1,000 mLs Intravenous New Bag/Given 07/18/2018 1523)    Mobility non-ambulatory     Focused Assessments    R Recommendations: See Admitting Provider Note  Report given to:   Additional Notes:

## 2018-08-02 NOTE — H&P (Signed)
PCCM:  Please see consult note dated 2018-08-06.  Josephine Igo, DO [facility] 2018/08/06 6:42 PM

## 2018-08-02 NOTE — ED Provider Notes (Signed)
[facility] EMERGENCY DEPARTMENT Provider Note   CSN: [id number] Arrival date & time: 08/09/2018  1318    History   Chief Complaint Chief Complaint  Patient presents with   Code Stroke   Failure To Thrive    HPI Caleb Carpenter is a 63 y.o. male.     HPI Patient presented to the emergency room for evaluation of possible acute code stroke.  According to the EMS report patient was last seen normal at about 7:00 this morning.  At around 10 AM in the found the patient unresponsive in his bed.  EMS found the patient only responding to pain initially.  He does have a history of alcohol abuse and COPD.  Code stroke was activated.  Patient was met by Dr. Leonie Man at the bridge.  At the bridge the patient was not responsive to verbal stimuli.  Patient also did not respond to painful stimuli. Past Medical History:  Diagnosis Date   Asthma    COPD (chronic obstructive pulmonary disease) ([place])    Hypertension    Hypertensive heart disease with chronic systolic congestive heart failure ([place]) 11/22/2017   NSTEMI (non-ST elevated myocardial infarction) [facility])     Patient Active Problem List   Diagnosis Date Noted   Palliative care by specialist    Acute exacerbation of chronic obstructive pulmonary disease (COPD) ([place]) 06/26/2018   Insomnia 12/15/2017   Chewing tobacco use 12/15/2017   Hypertensive heart disease with chronic systolic congestive heart failure ([place]) 11/22/2017   COPD with asthma ([place]) 11/22/2017   Dyslipidemia 11/22/2017   Evaluation by psychiatric service required    3-vessel coronary artery disease 11/03/2017   DNR (do not resuscitate) 11/03/2017   Ischemic cardiomyopathy 11/03/2017   Protein-calorie malnutrition, severe 10/30/2017   Goals of care, counseling/discussion    STEMI (ST elevation myocardial infarction) ([place]) 10/28/2017   Syncope 04/01/2017   Dementia ([place]) 62/70/3500   Chronic systolic heart failure ([place]) 11/01/2016    General weakness 09/03/2015   HTN (hypertension) 09/02/2015   Alcohol abuse 06/23/2015   Gastritis     Past Surgical History:  Procedure Laterality Date   CATARACT EXTRACTION, BILATERAL Bilateral    ESOPHAGOGASTRODUODENOSCOPY (EGD) WITH PROPOFOL N/A 06/21/2015   Procedure: ESOPHAGOGASTRODUODENOSCOPY (EGD) WITH PROPOFOL;  Surgeon: Lucilla Lame, MD;  Location: [facility] ENDOSCOPY;  Service: Endoscopy;  Laterality: N/A;   LEFT HEART CATH AND CORONARY ANGIOGRAPHY N/A 10/28/2017   Procedure: LEFT HEART CATH AND CORONARY ANGIOGRAPHY;  Surgeon: Martinique, Peter M, MD;  Location: [facility];  Service: Cardiovascular;  Laterality: N/A;        Home Medications    Prior to Admission medications   Medication Sig Start Date End Date Taking? Authorizing Provider  albuterol (PROVENTIL HFA;VENTOLIN HFA) 108 (90 Base) MCG/ACT inhaler Inhale 2 puffs into the lungs every 4 (four) hours as needed for wheezing.     [provider]  pantoprazole (PROTONIX) 40 MG tablet Take 40 mg by mouth daily.    [provider]  rosuvastatin (CRESTOR) 10 MG tablet Take 10 mg by mouth daily.    [provider]  Tiotropium Bromide-Olodaterol (STIOLTO RESPIMAT) 2.5-2.5 MCG/ACT AERS Inhale 2 puffs into the lungs daily.    [provider]    Family History Family History  Problem Relation Age of Onset   Diabetes Other    Hypertension Other     Social History Social History   Tobacco Use   Smoking status: Former Smoker    Types: Cigarettes  Last attempt to quit: 06/21/2014    Years since quitting: 4.1   Smokeless tobacco: Current User    Types: Chew  Substance Use Topics   Alcohol use: Yes    Alcohol/week: 2.0 standard drinks    Types: 2 Cans of beer per week    Comment: per day   Drug use: No     Allergies   Codeine   Review of Systems Review of Systems  Unable to perform ROS: Acuity of condition     Physical Exam Updated Vital Signs BP 118/86     Pulse (!) 131    Resp 15    Ht 1.651 m (_0 )    Wt 43 kg Comment: per previous visit   SpO2 100%    BMI 15.78 kg/m   Physical Exam Vitals signs and nursing note reviewed.  Constitutional:      Appearance: He is well-developed. He is ill-appearing.     Comments: Disheveled, underweight  HENT:     Head: Normocephalic.     Comments: Bruising noted around the left temple region    Right Ear: External ear normal.     Left Ear: External ear normal.  Eyes:     General: No scleral icterus.       Right eye: No discharge.        Left eye: No discharge.     Conjunctiva/sclera: Conjunctivae normal.  Neck:     Musculoskeletal: Neck supple.     Trachea: No tracheal deviation.  Cardiovascular:     Rate and Rhythm: Regular rhythm. Tachycardia present.  Pulmonary:     Effort: Pulmonary effort is normal. No respiratory distress.     Breath sounds: Normal breath sounds. No stridor. No wheezing or rales.  Abdominal:     General: Bowel sounds are normal. There is no distension.     Palpations: Abdomen is soft.     Tenderness: There is no abdominal tenderness. There is no guarding or rebound.     Comments: Scaphoid abdomen  Genitourinary:    Comments: Incontinent of brown stool Musculoskeletal:        General: No tenderness.     Comments: Decreased muscle mass  Skin:    General: Skin is warm and dry.     Findings: No rash.  Neurological:     GCS: GCS eye subscore is 2. GCS verbal subscore is 1. GCS motor subscore is 5.     Cranial Nerves: No cranial nerve deficit (no facial droop,  ).     Sensory: No sensory deficit.     Motor: Abnormal muscle tone present. No seizure activity.     Comments: Patient appeared to grab objects with his upper extremities, did not follow any commands      ED Treatments / Results  Labs (all labs ordered are listed, but only abnormal results are displayed) Labs Reviewed  PROTIME-INR - Abnormal; Notable for the following components:      Result Value    Prothrombin Time 18.8 (*)    INR 1.6 (*)    All other components within normal limits  CBC - Abnormal; Notable for the following components:   WBC 11.4 (*)    RBC 3.79 (*)    Hemoglobin 10.8 (*)    HCT 36.3 (*)    MCHC 29.8 (*)    RDW 17.4 (*)    Platelets 121 (*)    All other components within normal limits  DIFFERENTIAL - Abnormal; Notable for the following components:  Neutro Abs 10.5 (*)    Lymphs Abs 0.1 (*)    All other components within normal limits  COMPREHENSIVE METABOLIC PANEL - Abnormal; Notable for the following components:   Sodium 134 (*)    Chloride 96 (*)    CO2 14 (*)    BUN <5 (*)    Creatinine, Ser 1.36 (*)    Calcium 8.4 (*)    Total Protein 6.1 (*)    Albumin 3.0 (*)    AST 123 (*)    GFR calc non Af Amer 55 (*)    Anion gap 24 (*)    All other components within normal limits  CBG MONITORING, ED - Abnormal; Notable for the following components:   Glucose-Capillary 102 (*)    All other components within normal limits  APTT  ETHANOL  RAPID URINE DRUG SCREEN, HOSP PERFORMED  URINALYSIS, ROUTINE W REFLEX MICROSCOPIC  AMMONIA  TRIGLYCERIDES  I-STAT CREATININE, ED  I-STAT ARTERIAL BLOOD GAS, ED  TYPE AND SCREEN    EKG EKG Interpretation  Date/Time:  Sunday August 02 2018 14:44:56 EDT Ventricular Rate:  110 PR Interval:    QRS Duration: 129 QT Interval:  348 QTC Calculation: 471 R Axis:   47 Text Interpretation:  Sinus tachycardia Multiform ventricular premature complexes Nonspecific intraventricular conduction delay Anteroseptal infarct, age indeterminate Since last tracing rate slower Confirmed by Dorie Rank [phone]) on 07/20/2018 3:09:03 PM   Radiology Ct Angio Head W Or Wo Contrast  Result Date: 07/16/2018 CLINICAL DATA:  Code stroke. Unresponsive. Moving all 4 extremities but not following commands. EXAM: CT ANGIOGRAPHY HEAD AND NECK TECHNIQUE: Multidetector CT imaging of the head and neck was performed using the standard protocol during  bolus administration of intravenous contrast. Multiplanar CT image reconstructions and MIPs were obtained to evaluate the vascular anatomy. Carotid stenosis measurements (when applicable) are obtained utilizing NASCET criteria, using the distal internal carotid diameter as the denominator. CONTRAST:  96m OMNIPAQUE IOHEXOL 350 MG/ML SOLN COMPARISON:  Carotid Doppler ultrasound 10/11/2016. Chest CT 04/30/2017. FINDINGS: CTA NECK FINDINGS Aortic arch: There is a standard 3 vessel aortic arch with moderate atherosclerotic plaque. There is heterogeneous opacification of the included proximal descending thoracic aorta and to a lesser extent aortic arch with an appearance suggesting contrast mixing with unopacified blood. On some images, this mildly resembles an aortic dissection, however an intimal flap is not discretely seen. Prominent calcified and soft plaque in the distal left subclavian artery results in severe stenosis. Right carotid system: Patent with moderate, predominantly calcified plaque at the carotid bifurcation. No evidence of significant stenosis or dissection. Left carotid system: Patent with extensive, heavily calcified plaque resulting in 65% proximal ICA stenosis and 60% distal common carotid artery stenosis. Nonstenotic calcified plaque at the common carotid artery origin. Vertebral arteries: The left vertebral artery is occluded at its origin with distal reconstitution beginning at the C4 level. There is irregularity of the reconstituted distal V2 and V3 segments with a moderate stenosis noted at C2. The proximal right V1 segment including its origin is obscured by dense venous contrast. The remainder of the right vertebral artery is patent in the neck without evidence of dissection or significant stenosis. Skeleton: Detail cervical spine assessment is reported separately. No suspicious osseous lesion. Other neck: No mass or enlarged lymph nodes. Upper chest: Endotracheal tube terminates above the  carina. Enteric tube is partially visualized. Mild centrilobular and paraseptal emphysema. Review of the MIP images confirms the above findings CTA HEAD FINDINGS Anterior circulation: The internal  carotid arteries are patent from skull base to carotid termini with atherosclerosis bilaterally not resulting in significant stenosis. ACAs and MCAs are patent without evidence of proximal branch occlusion or significant proximal stenosis. No aneurysm is identified. Posterior circulation: The intracranial vertebral arteries are patent with calcified plaque on the right resulting in at most mild stenosis. Patent AICAs and SCAs are seen bilaterally. The basilar artery is widely patent. There is a patent left posterior communicating artery. Both PCAs are patent without evidence of significant proximal stenosis. No aneurysm is identified. Venous sinuses: Patent. Anatomic variants: None. Review of the MIP images confirms the above findings IMPRESSION: 1. 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 2. Heavily calcified plaque at the left carotid bifurcation with 65% proximal ICA stenosis. 3. No significant right-sided carotid artery stenosis. 4. Intracranial atherosclerosis without major branch occlusion or significant proximal stenosis. 5. Abnormal appearance of contrast in the aortic arch and included portion of the proximal descending aorta. This is suggestive of contrast mixing, possibly related to diminished cardiac output and heavily diseased aorta more distally as seen on 04/30/2017 chest CT. A discrete dissection flap is not evident. However, if there is clinical concern for acute aortic pathology, recommend CTA chest, abdomen, and pelvis. 6. Aortic Atherosclerosis (ICD10-I70.0) and Emphysema (ICD10-J43.9). These results were called by telephone at the time of interpretation on 07/19/2018 at 2:38 pm to Dr. Leonie Man, who verbally acknowledged these results. Electronically Signed   By: Logan Bores M.D.   On: 08/05/2018 15:07   Ct Angio Neck W Or Wo Contrast  Result Date: 08/08/2018 CLINICAL DATA:  Code stroke. Unresponsive. Moving all 4 extremities but not following commands. EXAM: CT ANGIOGRAPHY HEAD AND NECK TECHNIQUE: Multidetector CT imaging of the head and neck was performed using the standard protocol during bolus administration of intravenous contrast. Multiplanar CT image reconstructions and MIPs were obtained to evaluate the vascular anatomy. Carotid stenosis measurements (when applicable) are obtained utilizing NASCET criteria, using the distal internal carotid diameter as the denominator. CONTRAST:  74m OMNIPAQUE IOHEXOL 350 MG/ML SOLN COMPARISON:  Carotid Doppler ultrasound 10/11/2016. Chest CT 04/30/2017. FINDINGS: CTA NECK FINDINGS Aortic arch: There is a standard 3 vessel aortic arch with moderate atherosclerotic plaque. There is heterogeneous opacification of the included proximal descending thoracic aorta and to a lesser extent aortic arch with an appearance suggesting contrast mixing with unopacified blood. On some images, this mildly resembles an aortic dissection, however an intimal flap is not discretely seen. Prominent calcified and soft plaque in the distal left subclavian artery results in severe stenosis. Right carotid system: Patent with moderate, predominantly calcified plaque at the carotid bifurcation. No evidence of significant stenosis or dissection. Left carotid system: Patent with extensive, heavily calcified plaque resulting in 65% proximal ICA stenosis and 60% distal common carotid artery stenosis. Nonstenotic calcified plaque at the common carotid artery origin. Vertebral arteries: The left vertebral artery is occluded at its origin with distal reconstitution beginning at the C4 level. There is irregularity of the reconstituted distal V2 and V3 segments with a moderate stenosis noted at C2. The proximal right V1 segment including its origin is obscured by dense  venous contrast. The remainder of the right vertebral artery is patent in the neck without evidence of dissection or significant stenosis. Skeleton: Detail cervical spine assessment is reported separately. No suspicious osseous lesion. Other neck: No mass or enlarged lymph nodes. Upper chest: Endotracheal tube terminates above the carina. Enteric tube is partially visualized. Mild centrilobular and paraseptal  emphysema. Review of the MIP images confirms the above findings CTA HEAD FINDINGS Anterior circulation: The internal carotid arteries are patent from skull base to carotid termini with atherosclerosis bilaterally not resulting in significant stenosis. ACAs and MCAs are patent without evidence of proximal branch occlusion or significant proximal stenosis. No aneurysm is identified. Posterior circulation: The intracranial vertebral arteries are patent with calcified plaque on the right resulting in at most mild stenosis. Patent AICAs and SCAs are seen bilaterally. The basilar artery is widely patent. There is a patent left posterior communicating artery. Both PCAs are patent without evidence of significant proximal stenosis. No aneurysm is identified. Venous sinuses: Patent. Anatomic variants: None. Review of the MIP images confirms the above findings IMPRESSION: 1. 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 2. Heavily calcified plaque at the left carotid bifurcation with 65% proximal ICA stenosis. 3. No significant right-sided carotid artery stenosis. 4. Intracranial atherosclerosis without major branch occlusion or significant proximal stenosis. 5. Abnormal appearance of contrast in the aortic arch and included portion of the proximal descending aorta. This is suggestive of contrast mixing, possibly related to diminished cardiac output and heavily diseased aorta more distally as seen on 04/30/2017 chest CT. A discrete dissection flap is not evident. However, if there is clinical  concern for acute aortic pathology, recommend CTA chest, abdomen, and pelvis. 6. Aortic Atherosclerosis (ICD10-I70.0) and Emphysema (ICD10-J43.9). These results were called by telephone at the time of interpretation on 07/15/2018 at 2:38 pm to Dr. Leonie Man, who verbally acknowledged these results. Electronically Signed   By: Logan Bores M.D.   On: 08/07/2018 15:07   Dg Chest Portable 1 View  Result Date: 08/10/2018 CLINICAL DATA:  Respiratory failure.  Endotracheal tube placement. EXAM: PORTABLE CHEST 1 VIEW COMPARISON:  06/26/2018 and prior radiographs FINDINGS: An endotracheal tube is identified with tip 5 cm above the carina. An NG tube is present with tip overlying the proximal stomach and side hole overlying the distal esophagus. Emphysema type changes are noted. There is no evidence of focal airspace disease, pulmonary edema, suspicious pulmonary nodule/mass, pleural effusion, or pneumothorax. No acute bony abnormalities are identified. Bilateral rib and proximal RIGHT humeral fractures are again noted. IMPRESSION: 1. Endotracheal tube with tip 5 cm above the carina. 2. NG tube with tip overlying the proximal stomach and side hole overlying the distal esophagus-recommend advancement. 3. Emphysema Electronically Signed   By: Margarette Canada M.D.   On: 07/31/2018 14:16   Ct Head Code Stroke Wo Contrast  Result Date: 08/09/2018 CLINICAL DATA:  Code stroke. Unresponsive. Moving all extremities though not following commands. EXAM: CT HEAD WITHOUT CONTRAST TECHNIQUE: Contiguous axial images were obtained from the base of the skull through the vertex without intravenous contrast. COMPARISON:  04/07/2018 FINDINGS: Brain: There is a new small cortical infarct in the posterior right occipital lobe of indeterminate acuity though favored to be subacute to chronic. No acute large territory infarct, intracranial hemorrhage, mass, midline shift, or extra-axial fluid collection is identified. Moderate cerebral atrophy is  unchanged. Patchy cerebral white matter hypodensities are unchanged and nonspecific but compatible with mild-to-moderate chronic small vessel ischemic disease. Vascular: Calcified atherosclerosis at the skull base. No hyperdense vessel. Skull: No fracture or focal osseous lesion. Sinuses/Orbits: Chronic right maxillary sinusitis. Small chronic bilateral mastoid effusions. Bilateral cataract extraction. Other: None. ASPECTS [facility] Stroke Program Early CT Score) Not scored with this history. IMPRESSION: 1. No evidence of acute large territory infarct or intracranial hemorrhage. 2. Small right occipital infarct, new  from 03/2018. This is of indeterminate acuity though favored to be subacute to chronic. These results were called by telephone at the time of interpretation on 07/20/2018 at 2:38 pm to Dr. Leonie Man, who verbally acknowledged these results. Electronically Signed   By: Logan Bores M.D.   On: 07/25/2018 14:46    Procedures Procedure Name: Intubation Date/Time: 07/16/2018 1:49 PM Performed by: Dorie Rank, MD Pre-anesthesia Checklist: Patient identified, Patient being monitored, Emergency Drugs available, Timeout performed and Suction available Oxygen Delivery Method: Non-rebreather mask Preoxygenation: Pre-oxygenation with 100% oxygen Induction Type: Rapid sequence Ventilation: Mask ventilation without difficulty Laryngoscope Size: Glidescope and 3 Grade View: Grade II Tube size: 7.5 mm Number of attempts: 1 Placement Confirmation: ETT inserted through vocal cords under direct vision,  CO2 detector and Breath sounds checked- equal and bilateral Secured at: 24 cm     .Critical Care Performed by: Dorie Rank, MD Authorized by: Dorie Rank, MD   Critical care provider statement:    Critical care time (minutes):  35   Critical care was time spent personally by me on the following activities:  Discussions with consultants, evaluation of patient's response to treatment, examination of patient,  ordering and performing treatments and interventions, ordering and review of laboratory studies, ordering and review of radiographic studies, pulse oximetry, re-evaluation of patient's condition, obtaining history from patient or surrogate and review of old charts   (including critical care time)  Medications Ordered in ED Medications  sodium chloride flush (NS) 0.9 % injection 3 mL (3 mLs Intravenous Not Given 07/18/2018 1432)  sodium chloride 0.9 % bolus 500 mL (0 mLs Intravenous Stopped 07/20/2018 1432)    Followed by  0.9 %  sodium chloride infusion (100 mL/hr Intravenous Rate/Dose Verify 08/08/2018 1512)  fentaNYL 2566mg in NS 2575m(1019mml) infusion-PREMIX (50 mcg/hr Intravenous Rate/Dose Verify 08/01/2018 1512)  fentaNYL (SUBLIMAZE) bolus via infusion 50 mcg (has no administration in time range)  propofol (DIPRIVAN) 1000 MG/100ML infusion (5 mcg/kg/min  43 kg Intravenous Rate/Dose Verify 07/26/2018 1512)  sodium chloride 0.9 % bolus 1,000 mL (has no administration in time range)  fentaNYL (SUBLIMAZE) injection 50 mcg (50 mcg Intravenous Given by Other 07/23/2018 1443)  iohexol (OMNIPAQUE) 350 MG/ML injection 50 mL (50 mLs Intravenous Contrast Given 07/16/2018 1419)  diltiazem (CARDIZEM) injection 10 mg (10 mg Intravenous Given 08/11/2018 1438)  etomidate (AMIDATE) injection (15 mg Intravenous Given 08/06/2018 1334)  rocuronium (ZEMURON) injection (60 mg Intravenous Given 07/15/2018 1334)     Initial Impression / Assessment and Plan / ED Course  I have reviewed the triage vital signs and the nursing notes.  Pertinent labs & imaging results that were available during my care of the patient were reviewed by me and considered in my medical decision making (see chart for details).  Clinical Course as of Aug 01 1512  Sun Aug 02, 2018  1432 Initial labs notable for an anion gap metabolic acidosis.   [JK[ZG]  0174dated patient's niece, TraCyndia DiverI added her telephone number to be contact information on the  patient's chart   [JK]    Clinical Course User Index [JK] KnaDorie RankD     Patient presented to the ED for evaluation of altered mental status.  He was brought in as a code stroke.  Patient appears to have a more global neurologic deficit.  No gross focal neurologic deficits.  Stroke team was involved.  CT CT angios does not show any acute stroke symptoms.  Patient is not a TPA candidate  at this time.  Labs are notable for anion gap metabolic acidosis.  It is possible the patient may have had seizure.  Infection a concern but less likely. Alcohol level and ABG are still pending.  Patient was intubated for airway protection.  We will continue to monitor closely.  I will consult with critical care for admission and further treatment.   Final Clinical Impressions(s) / ED Diagnoses   Final diagnoses:  Altered mental status, unspecified altered mental status type      Dorie Rank, MD 07/22/2018 [phone]

## 2018-08-02 NOTE — ED Triage Notes (Signed)
Pt brought in by [facility] from home as a Code Stroke- pt LVO positive on EMS stroke scale. Pt LNW 0700 this am, found at 1000 in bed by roommate unresponsive, GCS of 8. Pt responding only to pain. Pt has hx of ETOH abuse. Pt moving all extremities voluntarily, but not able to follow commands.

## 2018-08-02 NOTE — Consult Note (Signed)
Reason for Consult: code stroke Referring Physician: Quion Hirsh is an 63 y.o. male.  HPI:  Pt brought in by [facility] from home as a Code Stroke- pt LVO positive on EMS stroke scale. Pt LNW 0700 this am, found at 1000 in bed by roommate unresponsive, GCS of 8. Pt responding only to pain. Pt has hx of ETOH abuse. Pt moving all extremities voluntarily, but not able to follow commands.  Patient's family was not available to confirm the history at the time of admission.  EMS did not notice any focal extremity weakness.  They only noticed tonic upward gaze deviation and some jerking of the right-sided extremities.  There was no tongue bite or marked frothing noted.  Review of the patient's electronic medical records reveal no prior history of seizures strokes.  He does have history of chronic alcoholism he was seen in the ER a month ago and labs at that time showed significant hyponatremia, elevated liver enzymes and an elevated lactic acid. Review of system was not obtainable as patient is unresponsive except for as stated in history of presenting illness Past Medical History:  Diagnosis Date  . Asthma   . COPD (chronic obstructive pulmonary disease) (HCC)   . Hypertension   . 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 NSTEMI (non-ST elevated myocardial infarction) [facility])     Past Surgical History:  Procedure Laterality Date  . CATARACT EXTRACTION, BILATERAL Bilateral   . ESOPHAGOGASTRODUODENOSCOPY (EGD) WITH PROPOFOL N/A 06/21/2015   Procedure: ESOPHAGOGASTRODUODENOSCOPY (EGD) WITH PROPOFOL;  Surgeon: Midge Minium, MD;  Location: [facility] ENDOSCOPY;  Service: Endoscopy;  Laterality: N/A;  . LEFT HEART CATH AND CORONARY ANGIOGRAPHY N/A 10/28/2017   Procedure: LEFT HEART CATH AND CORONARY ANGIOGRAPHY;  Surgeon: Swazil[place], Peter M, MD;  Location: [facility] INVASIVE CV LAB;  Service: Cardiovascular;  Laterality: N/A;    Family History  Problem Relation Age of  Onset  . Diabetes Other   . Hypertension Other     Social History:  reports that he quit smoking about 4 years ago. His smoking use included cigarettes. His smokeless tobacco use includes chew. He reports current alcohol use of about 2.0 standard drinks of alcohol per week. He reports that he does not use drugs.  Allergies:  Allergies  Allergen Reactions  . Codeine Other (See Comments)    ams    Medications: I have reviewed the patient's current medications.  Results for orders placed or performed during the hospital encounter of 08/05/2018 (from the past 48 hour(s))  CBG monitoring, ED     Status: Abnormal   Collection Time: 08/08/2018  1:20 PM  Result Value Ref Range   Glucose-Capillary 102 (H) 70 - 99 mg/dL  Protime-INR     Status: Abnormal   Collection Time: 08/09/2018  1:27 PM  Result Value Ref Range   Prothrombin Time 18.8 (H) 11.4 - 15.2 seconds   INR 1.6 (H) 0.8 - 1.2    Comment: (NOTE) INR goal varies based on device and disease states. Performed at [facility], [address], [place], [place] [address]   APTT     Status: None   Collection Time: 07/22/2018  1:27 PM  Result Value Ref Range   aPTT 35 24 - 36 seconds    Comment: Performed at [facility], [address], [place], [place] [address]  I-stat Creatinine, ED     Status: None   Collection Time: 08/05/2018  1:29 PM  Result Value Ref Range   Creatinine, Ser 1.00 0.61 - 1.24 mg/dL    No results found. CT-scan of the brain- no acute abnormality. 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  Calcific plaque at left carotid bifurcation with 65% stenosis.  No significant intracranial vessel occlusion or stenosis.  Abnormal contrast appearance and aortic arch with contrast mixing suggestive of low cardiac output likely. Serum creatinine is 0.6.     Blood pressure (!) 133/104,  pulse (!) 146, resp. rate 15, weight 43 kg, SpO2 100 %. Physical Exam  Frail cachectic malnourished looking middle-aged Caucasian male who is obtunded and unresponsive in mild respiratory distress.  Is tachycardic. . Afebrile. Head is nontraumatic. Neck is supple without bruit.    Cardiac exam no murmur or gallop. Lungs are clear to auscultation. Distal pulses are well felt. Neurological Exam :  Patient is stuporous.  He barely opens eyes to sternal rub will not follow commands or speak.  He makes some guttural noises.  He eyes are partially open.  He has tonic upward gaze deviation.  Pupils are 3 mm sluggishly reactive.  Fundi were not visualized corneal reflexes are present.  Also movements are sluggish.  He has spontaneous right upper and lower extremity purposeful movements.  He does move the left upper and lower extremity but less than on the right side.  He is able to hold left lower extremity against gravity but effort is variable in the left upper extremity.  Deep tendon flexes symmetric.  Plantars of both withdrawal response.  He withdraws to painful stimuli in all 4 extremities.  Gait not tested.  Assessment/Plan: 73 year caucasian male with sudden onset unresponsiveness  Without witnessed seizure activity or focal weakness.CT scan of the head and CT angiogram did not suggest any acute stroke or large vessel occlusion.  Right occipital hypodensity may represent age-indeterminate infarct.  Given his prior h/o alcoholsim  Toxic metabolic encephalopathy is more likely responsible for his presentation or he could have had an unwitnessed seizure with postictal confusional state. Plan patient has been intubated for airway protection.  Kindly admit to medical service.  Check EEG for seizure activity.  If patient neurological status does not improve after correction of reversible medical and metabolic abnormalities may consider obtaining MRI later.  Neuro hospitalist team to follow.  Stroke team will sign  off.  Kindly call for questions.  Discussed with Dr. Lynelle Doctor. This patient is critically ill and at significant risk of neurological worsening, death and care requires constant monitoring of vital signs, hemodynamics,respiratory and cardiac monitoring, extensive review of multiple databases, frequent neurological assessment, discussion with family, other specialists and medical decision making of high complexity.I have made any additions or clarifications directly to the above note.This critical care time does not reflect procedure time, or teaching time or supervisory time of PA/NP/Med Resident etc but could involve care discussion time.  I spent 60 minutes of neurocritical care time  in the care of  this patient.     Delia Heady 08/06/2018, 2:12 PM    Note: This document was prepared with digital dictation and possible smart phrase technology. Any transcriptional errors that result from this process are unintentional.

## 2018-08-03 ENCOUNTER — Inpatient Hospital Stay (HOSPITAL_COMMUNITY): Payer: Medicaid Other

## 2018-08-03 DIAGNOSIS — L8990 Pressure ulcer of unspecified site, unspecified stage: Secondary | ICD-10-CM

## 2018-08-03 DIAGNOSIS — Z515 Encounter for palliative care: Secondary | ICD-10-CM

## 2018-08-03 DIAGNOSIS — R569 Unspecified convulsions: Secondary | ICD-10-CM

## 2018-08-03 DIAGNOSIS — J988 Other specified respiratory disorders: Secondary | ICD-10-CM

## 2018-08-03 DIAGNOSIS — G8194 Hemiplegia, unspecified affecting left nondominant side: Secondary | ICD-10-CM

## 2018-08-03 LAB — BASIC METABOLIC PANEL
Anion gap: 16 — ABNORMAL HIGH (ref 5–15)
BUN: 6 mg/dL — ABNORMAL LOW (ref 8–23)
CO2: 16 mmol/L — ABNORMAL LOW (ref 22–32)
Calcium: 6.5 mg/dL — ABNORMAL LOW (ref 8.9–10.3)
Chloride: 108 mmol/L (ref 98–111)
Creatinine, Ser: 1.24 mg/dL (ref 0.61–1.24)
GFR calc Af Amer: >60 mL/min (ref >60)
GFR calc non Af Amer: >60 mL/min (ref >60)
Glucose, Bld: 113 mg/dL — ABNORMAL HIGH (ref 70–99)
Potassium: 3.5 mmol/L (ref 3.5–5.1)
Sodium: 140 mmol/L (ref 135–145)

## 2018-08-03 LAB — CBC
HCT: 30.2 % — ABNORMAL LOW (ref 39.0–52.0)
Hemoglobin: 9 g/dL — ABNORMAL LOW (ref 13.0–17.0)
MCH: 28.8 pg (ref 26.0–34.0)
MCHC: 29.8 g/dL — ABNORMAL LOW (ref 30.0–36.0)
MCV: 96.5 fL (ref 80.0–100.0)
Platelets: 122 10*3/uL — ABNORMAL LOW (ref 150–400)
RBC: 3.13 MIL/uL — ABNORMAL LOW (ref 4.22–5.81)
RDW: 17.8 % — ABNORMAL HIGH (ref 11.5–15.5)
WBC: 9.8 10*3/uL (ref 4.0–10.5)
nRBC: 0.2 % (ref 0.0–0.2)

## 2018-08-03 LAB — POCT I-STAT 7, (LYTES, BLD GAS, ICA,H+H)
Acid-base deficit: 10 mmol/L — ABNORMAL HIGH (ref 0.0–2.0)
Bicarbonate: 15 mmol/L — ABNORMAL LOW (ref 20.0–28.0)
Calcium, Ion: 0.92 mmol/L — ABNORMAL LOW (ref 1.15–1.40)
HCT: 27 % — ABNORMAL LOW (ref 39.0–52.0)
Hemoglobin: 9.2 g/dL — ABNORMAL LOW (ref 13.0–17.0)
O2 Saturation: 99 %
Patient temperature: 98.6
Potassium: 3.6 mmol/L (ref 3.5–5.1)
Sodium: 139 mmol/L (ref 135–145)
TCO2: 16 mmol/L — ABNORMAL LOW (ref 22–32)
pCO2 arterial: 28 mmHg — ABNORMAL LOW (ref 32.0–48.0)
pH, Arterial: 7.338 — ABNORMAL LOW (ref 7.350–7.450)
pO2, Arterial: 132 mmHg — ABNORMAL HIGH (ref 83.0–108.0)

## 2018-08-03 LAB — OCCULT BLOOD X 1 CARD TO LAB, STOOL: Fecal Occult Bld: POSITIVE — AB

## 2018-08-03 LAB — GLUCOSE, CAPILLARY
Glucose-Capillary: 122 mg/dL — ABNORMAL HIGH (ref 70–99)
Glucose-Capillary: 75 mg/dL (ref 70–99)

## 2018-08-03 LAB — MAGNESIUM: Magnesium: 1.7 mg/dL (ref 1.7–2.4)

## 2018-08-03 LAB — PHOSPHORUS: Phosphorus: 4.5 mg/dL (ref 2.5–4.6)

## 2018-08-03 MED ORDER — ADULT MULTIVITAMIN W/MINERALS CH: 1.0000 | ORAL_TABLET | Freq: Every day | ORAL | Status: DC

## 2018-08-03 MED ORDER — LORAZEPAM 2 MG/ML IJ SOLN
2.0000 mg | INTRAMUSCULAR | Status: DC | PRN
  Administered 2018-08-03: 2 mg via INTRAVENOUS
  Filled 2018-08-03: qty 1

## 2018-08-03 MED ORDER — LEVETIRACETAM IN NACL 1000 MG/100ML IV SOLN
1000.0000 mg | INTRAVENOUS | Status: AC
  Administered 2018-08-03: 1000 mg via INTRAVENOUS
  Filled 2018-08-03: qty 100

## 2018-08-03 MED ORDER — LEVETIRACETAM IN NACL 500 MG/100ML IV SOLN
500.0000 mg | Freq: Two times a day (BID) | INTRAVENOUS | Status: DC
  Administered 2018-08-03: 500 mg via INTRAVENOUS
  Filled 2018-08-03: qty 100

## 2018-08-03 MED ORDER — SODIUM CHLORIDE 0.9 % IV BOLUS
500.0000 mL | Freq: Once | INTRAVENOUS | Status: AC
  Administered 2018-08-03: 500 mL via INTRAVENOUS

## 2018-08-03 MED ORDER — DEXMEDETOMIDINE HCL IN NACL 200 MCG/50ML IV SOLN
0.2000 ug/kg/h | INTRAVENOUS | Status: DC
  Administered 2018-08-03: 0.2 ug/kg/h via INTRAVENOUS
  Filled 2018-08-03: qty 50

## 2018-08-03 MED ORDER — ALBUTEROL SULFATE (2.5 MG/3ML) 0.083% IN NEBU: 2.5000 mg | INHALATION_SOLUTION | RESPIRATORY_TRACT | Status: DC | PRN

## 2018-08-03 MED ORDER — PANTOPRAZOLE SODIUM 40 MG IV SOLR
40.0000 mg | Freq: Two times a day (BID) | INTRAVENOUS | Status: DC
  Administered 2018-08-03: 40 mg via INTRAVENOUS
  Filled 2018-08-03 (×2): qty 40

## 2018-08-03 MED ORDER — LORAZEPAM 2 MG/ML IJ SOLN: 1.0000 mg | INTRAMUSCULAR | Status: DC | PRN

## 2018-08-05 ENCOUNTER — Telehealth: Payer: Self-pay | Admitting: Pulmonary Disease

## 2018-08-05 LAB — CULTURE, BLOOD (ROUTINE X 2): Special Requests: ADEQUATE

## 2018-08-05 NOTE — Telephone Encounter (Signed)
08/05/2018 received D/C from [facility] Faxed copy the orignal to follow. Icard is out till Saturday I will see if Dr. Sherene Sires will sign for him at the pulmonary office. PWR  08/06/18 received signed D/C back will call [employer] to pick up and I will fax them a copy. PWR

## 2018-08-07 LAB — CULTURE, BLOOD (ROUTINE X 2)
Culture: NO GROWTH
Special Requests: ADEQUATE

## 2018-08-10 ENCOUNTER — Telehealth: Payer: Self-pay

## 2018-08-10 NOTE — Telephone Encounter (Signed)
Received dc from [facility].. Patient is a patient of Doctor Icard.  DC is for cremation.   DC will be taken to Pulmonary Unit for Wert to sign the dc.

## 2018-08-12 NOTE — Telephone Encounter (Signed)
Received original signed D/C,D/C mailed to [facility] Dept. as requested.

## 2018-08-14 NOTE — Evaluation (Addendum)
Occupational Therapy Evaluation Patient Details Name: Caleb Carpenter MRN: [id number] DOB: 08-09-1955 Today's Date: 08/21/18    History of Present Illness 63 year old man with past medical history of COPD, HTN, Etoh abuse, presented as a possible LVO positive stroke yesterday when he was found unresponsive only responding to pain by roommate/bystander. MRI showing small areas left mesial temporal lobe DWI changes-likely seizure related.  Intubated 08/01/2018-08/02/17.    Clinical Impression   PTA, pt was living with "[name]" and presenting with difficulty answering PLOF and home set up questions. Pt currently requiring Mod-Max A for bathing, dressing, and toileting and Max A +2 for functional transfers. Pt with decreased cognition, strength, and balance. Also presenting with fear of falling and decreased strength at LUE/LLE. VSS throughout and BP soft. Pt would benefit from further acute OT to facilitate safe dc. Recommend dc to SNF for further OT to optimize safety, independence with ADLs, and return to PLOF.      Follow Up Recommendations  SNF;Supervision/Assistance - 24 hour    Equipment Recommendations  Other (comment)(Defer to next venue)    Recommendations for Other Services PT consult;Speech consult     Precautions / Restrictions Precautions Precautions: Fall      Mobility Bed Mobility Overal bed mobility: Needs Assistance Bed Mobility: Supine to Sit;Sit to Supine     Supine to sit: Max assist;+2 for physical assistance Sit to supine: Min assist;+2 for physical assistance   General bed mobility comments: Max A +2 for bringing hips to EOB and elevating trunk. Pt requiring Min A to manage LLE over EOB in return to supine  Transfers Overall transfer level: Needs assistance   Transfers: Sit to/from [employer] Transfers Sit to Stand: Max assist;+2 physical assistance   Squat pivot transfers: Max assist;+2 physical assistance;+2 safety/equipment     General transfer  comment: Max A +2 power up into standing. Pt with increased fear of falling requiring +2 for stability and to maintian upright posture. Blocking bil knees    Balance Overall balance assessment: Needs assistance Sitting-balance support: No upper extremity supported;Feet supported Sitting balance-Leahy Scale: Fair     Standing balance support: Bilateral upper extremity supported;During functional activity Standing balance-Leahy Scale: Poor Standing balance comment: Requiring physical A for standing                           ADL either performed or assessed with clinical judgement   ADL Overall ADL's : Needs assistance/impaired Eating/Feeding: Set up;Supervision/ safety;Cueing for sequencing;Bed level   Grooming: Minimal assistance;Bed level;Sitting   Upper Body Bathing: Moderate assistance;Sitting;Bed level   Lower Body Bathing: Maximal assistance;+2 for physical assistance;Sit to/from stand;Bed level   Upper Body Dressing : Moderate assistance;Sitting;Bed level   Lower Body Dressing: Maximal assistance;+2 for physical assistance;+2 for safety/equipment;Sit to/from stand;Bed level   Toilet Transfer: Maximal assistance;+2 for physical assistance;BSC;Squat-pivot Toilet Transfer Details (indicate cue type and reason): Max A +2 to squat-pivot ot BSC. Pt with significant fear of falling         Functional mobility during ADLs: Maximal assistance;+2 for physical assistance;+2 for safety/equipment(squatpivot) General ADL Comments: Pt requiring Max A for bathing, dressing, and toileting due to poor cognition, balance, and strength     Vision         Perception     Praxis      Pertinent Vitals/Pain Pain Assessment: Faces Faces Pain Scale: Hurts even more Pain Location: LLE Pain Descriptors / Indicators: Discomfort;Grimacing Pain Intervention(s): Monitored during session;Limited  activity within patient's tolerance;Repositioned     Hand Dominance Right    Extremity/Trunk Assessment Upper Extremity Assessment Upper Extremity Assessment: Generalized weakness;LUE deficits/detail LUE Deficits / Details: Decreased AROM and use of LUE. Difficult to assess due to cognition. Noting weakness LUE Coordination: decreased gross motor   Lower Extremity Assessment Lower Extremity Assessment: Defer to PT evaluation   Cervical / Trunk Assessment Cervical / Trunk Assessment: Other exceptions Cervical / Trunk Exceptions: Decreased trunk strength   Communication Communication Communication: HOH   Cognition Arousal/Alertness: Awake/alert Behavior During Therapy: Flat affect;Impulsive Overall Cognitive Status: History of cognitive impairments - at baseline                                 General Comments: Baseline dementia. Poor awareness and following of tasks. Requiring Max cues for safety and poor participation. perseverating on "using bedpan"   General Comments  VSS. 100% SpO2 on 4L. BP 90/71. HR 121 with activity    Exercises     Shoulder Instructions      Home Living Family/patient expects to be discharged to:: Private residence Living Arrangements: Non-relatives/Friends(Roommate on [place])                               Additional Comments: He reports he lives with "Jonny:      Prior Functioning/Environment Level of Independence: Needs assistance        Comments: Pt not answering questions about home and PLOF        OT Problem List: Decreased strength;Decreased range of motion;Decreased activity tolerance;Impaired balance (sitting and/or standing);Decreased knowledge of use of DME or AE;Decreased knowledge of precautions;Pain      OT Treatment/Interventions: Self-care/ADL training;Therapeutic exercise;Energy conservation;DME and/or AE instruction;Therapeutic activities;Patient/family education    OT Goals(Current goals can be found in the care plan section) Acute Rehab OT Goals Patient Stated Goal: "I  need to crap" OT Goal Formulation: With patient Time For Goal Achievement: 08/17/18 Potential to Achieve Goals: Good  OT Frequency: Min 2X/week   Barriers to D/C:            Co-evaluation PT/OT/SLP Co-Evaluation/Treatment: Yes Reason for Co-Treatment: For patient/therapist safety;To address functional/ADL transfers;Complexity of the patient's impairments (multi-system involvement)   OT goals addressed during session: ADL's and self-care      AM-PAC OT "6 Clicks" Daily Activity     Outcome Measure Help from another person eating meals?: A Little Help from another person taking care of personal grooming?: A Little Help from another person toileting, which includes using toliet, bedpan, or urinal?: A Lot Help from another person bathing (including washing, rinsing, drying)?: A Lot Help from another person to put on and taking off regular upper body clothing?: A Lot Help from another person to put on and taking off regular lower body clothing?: A Lot 6 Click Score: 14   End of Session Equipment Utilized During Treatment: Oxygen Nurse Communication: Mobility status;Other (comment)(Blood in stool)  Activity Tolerance: Patient limited by lethargy;Patient limited by pain;Patient limited by fatigue Patient left: in bed;with call bell/phone within reach;with bed alarm set  OT Visit Diagnosis: Unsteadiness on feet (R26.81);Other abnormalities of gait and mobility (R26.89);Muscle weakness (generalized) (M62.81);Pain Pain - Right/Left: Left Pain - part of body: Leg                Time: 1610-9604 OT Time Calculation (min): 32 min Charges:  OT  General Charges $OT Visit: 1 Visit OT Evaluation $OT Eval Moderate Complexity: 1 Mod  Shenell Rogalski MSOT, OTR/L Acute Rehab Pager: [phone] Office: [phone]  Theodoro Grist Maylin [place] 08-12-2018, 10:46 AM

## 2018-08-14 NOTE — NC FL2 (Signed)
[place] MEDICAID FL2 LEVEL OF CARE SCREENING TOOL     IDENTIFICATION  Patient Name: Caleb Carpenter Birthdate: 1955/05/14 Sex: male Admission Date (Current Location): 08/08/2018  [place] and [place] Number:  Haynes Bast [id number] M Facility and Address:  [facility], [address] [place], [place] [address]      Provider Number: [id number]  Attending Physician Name and Address:  Josephine Igo, DO  Relative Name and Phone Number:       Current Level of Care: Hospital Recommended Level of Care: Skilled Nursing Facility Prior Approval Number:    Date Approved/Denied:   PASRR Number: [id number]  Discharge Plan: SNF    Current Diagnoses: Patient Active Problem List   Diagnosis Date Noted  . Pressure injury of skin Sep 01, 2018  . Palliative care encounter   . Seizures (HCC)   . Acute encephalopathy 08/05/2018  . Trauma   . Palliative care by specialist   . Acute exacerbation of chronic obstructive pulmonary disease (COPD) (HCC) 06/26/2018  . Insomnia 12/15/2017  . Chewing tobacco use 12/15/2017  . 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 COPD with asthma (HCC) 11/22/2017  . Dyslipidemia 11/22/2017  . Evaluation by psychiatric service required   . 3-vessel coronary artery disease 11/03/2017  . DNR (do not resuscitate) 11/03/2017  . Ischemic cardiomyopathy 11/03/2017  . Protein-calorie malnutrition, severe 10/30/2017  . Goals of care, counseling/discussion   . STEMI (ST elevation myocardial infarction) (HCC) 10/28/2017  . Syncope 04/01/2017  . Dementia (HCC) 11/06/2016  . Chronic systolic heart failure (HCC) 11/01/2016  . General weakness 09/03/2015  . HTN (hypertension) 09/02/2015  . Alcohol abuse 06/23/2015  . Gastritis     Orientation RESPIRATION BLADDER Height & Weight     Self  Normal Incontinent Weight: 44.8 kg Height:  5\' 5"  (165.1 cm)  BEHAVIORAL SYMPTOMS/MOOD NEUROLOGICAL  BOWEL NUTRITION STATUS      Incontinent    AMBULATORY STATUS COMMUNICATION OF NEEDS Skin   Extensive Assist Verbally                         Personal Care Assistance Level of Assistance  Total care       Total Care Assistance: Maximum assistance   Functional Limitations Info             SPECIAL CARE FACTORS FREQUENCY  PT (By licensed PT), OT (By licensed OT)     PT Frequency: 5-6 times/ week OT Frequency: 5-6 times/week            Contractures Contractures Info: Not present    Additional Factors Info  Code Status, Allergies Code Status Info: DNR Allergies Info: Codeine           Current Medications (09/01/2018):  This is the current hospital active medication list Current Facility-Administered Medications  Medication Dose Route Frequency Provider Last Rate Last Dose  . 0.9 %  sodium chloride infusion  100 mL/hr Intravenous Continuous Linwood Dibbles, MD 100 mL/hr at Sep 01, 2018 1500 100 mL/hr at 09/01/2018 1500  . 0.9 %  sodium chloride infusion   Intravenous PRN Icard, Bradley L, DO   Stopped at 09/01/18 0046  . acetaminophen (TYLENOL) tablet 650 mg  650 mg Oral Q4H PRN Icard, Bradley L, DO      . albuterol (PROVENTIL) (2.5 MG/3ML) 0.083% nebulizer solution 2.5 mg  2.5 mg Nebulization Q2H PRN Bowser, Kaylyn Layer, NP      . ceFEPIme (MAXIPIME)  2 g in sodium chloride 0.9 % 100 mL IVPB  2 g Intravenous Q12H Daylene Posey, RPH   Stopped at 07/30/2018 1230  . folic acid injection 1 mg  1 mg Intravenous Daily Icard, Bradley L, DO   1 mg at 08/06/2018 0943  . heparin injection 5,000 Units  5,000 Units Subcutaneous Q8H Icard, Bradley L, DO   Stopped at 07/17/2018 1441  . levETIRAcetam (KEPPRA) IVPB 500 mg/100 mL premix  500 mg Intravenous Q12H Milon Dikes, MD      . LORazepam (ATIVAN) injection 2-3 mg  2-3 mg Intravenous Q1H PRN Lanier Clam, NP   2 mg at 08/06/2018 1529  . multivitamin with minerals tablet 1 tablet  1 tablet Oral Daily Bowser, Grace E, NP      . pantoprazole  (PROTONIX) injection 40 mg  40 mg Intravenous Q12H Bowser, Kaylyn Layer, NP   40 mg at 07/17/2018 1442  . thiamine (B-1) 250 mg in sodium chloride 0.9 % 50 mL IVPB  250 mg Intravenous Q24H Icard, Bradley L, DO   Stopped at 08/20/18 1953     Discharge Medications: Please see discharge summary for a list of discharge medications.  Relevant Imaging Results:  Relevant Lab Results:   Additional Information     Quintella Baton, RN, BSN  Trauma/Neuro ICU Case Manager [phone]

## 2018-08-14 NOTE — Progress Notes (Signed)
Neurology Progress Note   S:// Patient seen and examined. Remains intubated, in preparation for extubation per primary team. Awake alert following commands.   O:// Current vital signs: BP 95/69   Pulse (!) 128   Temp 98.5 F (36.9 C) (Oral)   Resp (!) 23   Ht _0  (1.651 m)   Wt 44.8 kg   SpO2 97%   BMI 16.44 kg/m  Vital signs in last 24 hours: Temp:  [94.7 F (34.8 C)-99.9 F (37.7 C)] 98.5 F (36.9 C) (04/20 0400) Pulse Rate:  [108-158] 128 (04/20 0735) Resp:  [12-23] 23 (04/20 0735) BP: (60-172)/(47-129) 95/69 (04/20 0735) SpO2:  [93 %-100 %] 97 % (04/20 0735) FiO2 (%):  [40 %] 40 % (04/20 0735) Weight:  [43 kg-45.3 kg] 44.8 kg (04/20 0500) General: Intubated on no sedation, cachectic appearing man HEENT: Normocephalic atraumatic CVS: Respiratory regular rate rhythm Respiratory: Scattered rales Neurological exam Intubated on no sedation Patient is awake, alert, difficult to assess orientation but nods appropriately to questions. He is able to follow commands with his actions. Cranial nerves: Pupils equal round react light, extraocular movements intact, visual fields appear full to threat, difficult ascertain facial symmetry, auditory acuity intact to conversation. Motor exam: He is moving all 4 extremities albeit appears left side is mildly weaker than the right side. Sensory exam: Intact light touch all over Coordination: Difficult to perform but no obvious ataxia noted.  Medications  Current Facility-Administered Medications:  .  [COMPLETED] sodium chloride 0.9 % bolus 500 mL, 500 mL, Intravenous, Once, Stopped at 07/31/2018 1432 **FOLLOWED BY** 0.9 %  sodium chloride infusion, 100 mL/hr, Intravenous, Continuous, Dorie Rank, MD, Last Rate: 100 mL/hr at 08/11/2018 1900, 100 mL/hr at 08/08/2018 1900 .  0.9 %  sodium chloride infusion, , Intravenous, PRN, Icard, Bradley L, DO, Last Rate: 10 mL/hr at 07/30/2018 1900 .  acetaminophen (TYLENOL) tablet 650 mg, 650 mg, Oral,  Q4H PRN, Icard, Bradley L, DO .  albuterol (PROVENTIL) (2.5 MG/3ML) 0.083% nebulizer solution 2.5 mg, 2.5 mg, Nebulization, Q2H PRN, Bowser, Grace E, NP .  ceFEPIme (MAXIPIME) 2 g in sodium chloride 0.9 % 100 mL IVPB, 2 g, Intravenous, Q12H, Bertis Ruddy, RPH .  chlorhexidine gluconate (MEDLINE KIT) (PERIDEX) 0.12 % solution 15 mL, 15 mL, Mouth Rinse, BID, Icard, Bradley L, DO, 15 mL at 08/11/2018 2358 .  dexmedetomidine (PRECEDEX) 200 MCG/50ML (4 mcg/mL) infusion, 0.2-0.7 mcg/kg/hr, Intravenous, Continuous, Bowser, Grace E, NP, Last Rate: 2.24 mL/hr at 05-Aug-2018 0854, 0.2 mcg/kg/hr at 08-05-18 0854 .  famotidine (PEPCID) tablet 20 mg, 20 mg, Oral, BID, Icard, Bradley L, DO, 20 mg at 07/15/2018 2136 .  fentaNYL (SUBLIMAZE) bolus via infusion 50 mcg, 50 mcg, Intravenous, Q1H PRN, Dorie Rank, MD .  fentaNYL 2510mg in NS 2571m(1046mml) infusion-PREMIX, 25-400 mcg/hr, Intravenous, Continuous, KnaDorie RankD, Last Rate: 2.5 mL/hr at 07/23/2018 1900, 25 mcg/hr at 08/07/2018 1900 .  folic acid injection 1 mg, 1 mg, Intravenous, Daily, Icard, Bradley L, DO, 1 mg at 07/21/2018 1807 .  heparin injection 5,000 Units, 5,000 Units, Subcutaneous, Q8H, Icard, Bradley L, DO, 5,000 Units at 04/04-22-2059 .  levETIRAcetam (KEPPRA) IVPB 1000 mg/100 mL premix, 1,000 mg, Intravenous, STAT, AroAmie PortlandD .  levETIRAcetam (KEPPRA) IVPB 500 mg/100 mL premix, 500 mg, Intravenous, Q12H, AroAmie PortlandD .  LORazepam (ATIVAN) injection 1-2 mg, 1-2 mg, Intravenous, Q1H PRN, Bowser, Grace E, NP .  MEDLINE mouth rinse, 15 mL, Mouth Rinse, 10 times per day, Icard, Bradley L, DO,  15 mL at 08-12-2018 0601 .  propofol (DIPRIVAN) 1000 MG/100ML infusion, 0-50 mcg/kg/min, Intravenous, Continuous, Dorie Rank, MD, Last Rate: 2.58 mL/hr at 07/23/2018 1900, 10 mcg/kg/min at 07/20/2018 1900 .  sodium chloride flush (NS) 0.9 % injection 3 mL, 3 mL, Intravenous, Once, Dorie Rank, MD .  thiamine (B-1) 250 mg in sodium chloride 0.9 % 50 mL IVPB, 250  mg, Intravenous, Q24H, Icard, Bradley L, DO, Last Rate: 100 mL/hr at 07/24/2018 1922, 250 mg at 07/31/2018 1922 Labs CBC    Component Value Date/Time   WBC 9.8 08-12-2018 0541   RBC 3.13 (L) 08-12-2018 0541   HGB 9.0 (L) 08-12-18 0541   HCT 30.2 (L) 08/12/2018 0541   PLT 122 (L) 12-Aug-2018 0541   MCV 96.5 08/12/18 0541   MCH 28.8 08-12-18 0541   MCHC 29.8 (L) 12-Aug-2018 0541   RDW 17.8 (H) 2018/08/12 0541   LYMPHSABS 0.1 (L) 07/23/2018 1327   MONOABS 0.8 08/06/2018 1327   EOSABS 0.0 07/29/2018 1327   BASOSABS 0.0 08/05/2018 1327    CMP     Component Value Date/Time   NA 140 08-12-2018 0541   K 3.5 08/12/2018 0541   CL 108 08-12-2018 0541   CO2 16 (L) 08-12-2018 0541   GLUCOSE 113 (H) 08-12-2018 0541   BUN 6 (L) 2018-08-12 0541   CREATININE 1.24 2018-08-12 0541   CALCIUM 6.5 (L) 08-12-2018 0541   PROT 6.1 (L) 07/18/2018 1327   ALBUMIN 3.0 (L) 08/06/2018 1327   AST 123 (H) 08/01/2018 1327   ALT 20 08/05/2018 1327   ALKPHOS 109 08/07/2018 1327   BILITOT 0.8 07/15/2018 1327   GFRNONAA >60 2018-08-12 0541   GFRAA >60 12-Aug-2018 0541    Imaging I have reviewed images in epic and the results pertinent to this consultation are: MRI of the brain completed early this morning with small areas left mesial temporal lobe DWI changes-likely seizure related  Assessment: 63 year old man with past medical history of COPD, hypertension, presented as a possible LVO positive stroke yesterday when he was found unresponsive only responding to pain by roommate/bystander. Intubated for airway protection. There was reported tonic upward gaze deviation and some jerking of right-sided extremities with no tongue biting frothing or incontinence noted. No stroke treatment offered because of presentation consistent with toxic metabolic encephalopathy versus seizures rather than stroke. He is awake alert at this time although remains intubated. He is mildly hemiparetic on the left and I will try  to get more history as he is extubated and is able to communicate more going forward. Also had significant hyponatremia and elevated liver enzymes and elevated lactic acid on presentation. Some of this could be seizure related but the other might be toxic metabolic derangements from alcoholic liver disease and other etiologies that are currently being evaluated and managed by the primary team.  I do suspect that he might have had a seizure that led to his presentation to the ER-whether it is alcohol withdrawal or alcohol-related is unclear at this time.  EEG not concerning for seizures-had triphasics only, along with generalized irregular slow activity and absent PDR.  Impression: Seizure -?  Could be alcohol-related. Toxic metabolic encephalopathy  Recommendations: Load Keppra 1 g IV x1 Start Keppra 500 twice daily from tonight Maintain seizure precautions Replace thiamine Correction of toxic metabolic derangements per primary team as you are  -- Amie Portland, MD [facility] Pager: [phone] If 7pm to 7am, please call on call as listed on AMION.  CRITICAL CARE ATTESTATION Performed  by: Amie Portland, MD Total critical care time: 25 minutes Critical care time was exclusive of separately billable procedures and treating other patients and/or supervising APPs/Residents/Students Critical care was necessary to treat or prevent imminent or life-threatening deterioration due to toxic metabolic encephalopathy, seizures. This patient is critically ill and at significant risk for neurological worsening and/or death and care requires constant monitoring. Critical care was time spent personally by me on the following activities: development of treatment plan with patient and/or surrogate as well as nursing, discussions with consultants, evaluation of patient's response to treatment, examination of patient, obtaining history from patient or surrogate, ordering and performing  treatments and interventions, ordering and review of laboratory studies, ordering and review of radiographic studies, pulse oximetry, re-evaluation of patient's condition, participation in multidisciplinary rounds and medical decision making of high complexity in the care of this patient.

## 2018-08-14 NOTE — Progress Notes (Signed)
NAME:  Caleb Carpenter, MRN:  [id number], DOB:  1955-08-11, LOS: 1 ADMISSION DATE:  08/05/2018, CONSULTATION DATE:  2018/08/14  REFERRING MD: Dr. Lynelle Doctor, CHIEF COMPLAINT: Intubated, unresponsive  Brief History   63 year old gentleman, past medical history of chronic alcohol abuse.  Patient was brought via EMS initially as a code stroke found minimally responsive at home with a GCS of 8.  He has a history of alcohol abuse.  Patient was initially seen by our stroke team here in the ED.  Patient has been admitted multiple times in the past.  Prior durable DNR status dating back till 2019.  Last seen by palliative care in March 2020.  Last consultation at that time also deemed as a durable DNR with recommendations for discharge home to hospice.   Past Medical History   Past Medical History:  Diagnosis Date  . Asthma   . COPD (chronic obstructive pulmonary disease) (HCC)   . Hypertension   . 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 NSTEMI (non-ST elevated myocardial infarction) [facility])      Significant Hospital Events   4/19: ETT  Consults:  4/19: PCCM  Procedures:  EEG 4/19> generalized slow and irregular activity. EEG consistent with nonspecific encephalopathy. No seizure activity recorded.   Significant Diagnostic Tests:   CT Head and Neck: IMPRESSION: 1. 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 2. Heavily calcified plaque at the left carotid bifurcation with 65% proximal ICA stenosis. 3. No significant right-sided carotid artery stenosis. 4. Intracranial atherosclerosis without major branch occlusion or significant proximal stenosis. 5. Abnormal appearance of contrast in the aortic arch and included portion of the proximal descending aorta. This is suggestive of contrast mixing, possibly related to diminished cardiac output and heavily diseased aorta more distally as seen on 04/30/2017 chest CT. A  discrete dissection flap is not evident. However, if there is clinical concern for acute aortic pathology, recommend CTA chest, abdomen, and pelvis. 6. Aortic Atherosclerosis (ICD10-I70.0) and Emphysema (ICD10-J43.9).  MRI Brain 4/20> R cerebellum and medial L temporal lobe acute or subacute ischemia. Atrophy and chronic ischemic changes of white matter. Personally reviewed  CXR 4/20> no focal infiltrates  Micro Data:  BCx: NGTD   Antimicrobials:  Cefepime    Interim history/subjective:  Patient intubated and sedated. Vent wean initiated at 0700, tolerating PSV/CPAP 40% FiO2, PEEP 5 without distress this morning.   Objective   Blood pressure 95/69, pulse (!) 128, temperature 98.5 F (36.9 C), temperature source Oral, resp. rate (!) 23, height 5\' 5"  (1.651 m), weight 44.8 kg, SpO2 97 %.    Vent Mode: PSV;CPAP FiO2 (%):  [40 %] 40 % Set Rate:  [15 bmp] 15 bmp Vt Set:  [490 mL] 490 mL PEEP:  [5 cmH20] 5 cmH20 Pressure Support:  [8 cmH20] 8 cmH20 Plateau Pressure:  [11 cmH20-17 cmH20] 12 cmH20   Intake/Output Summary (Last 24 hours) at 08-14-2018 0801 Last data filed at 08-14-2018 0600 Gross per 24 hour  Intake 2586.99 ml  Output 1000 ml  Net 1586.99 ml   Filed Weights   08/13/2018 1300 08/01/2018 1720 08/14/2018 0500  Weight: 43 kg 45.3 kg 44.8 kg    Examination: General: Chronically ill appearing older adult male, intubated, supine in bed, NAD  HENT: NCAT. Pink tacky mucus membranes. Trachea midline.  Lungs: CTA bilaterally. Symmetrical chest expansion, no accessory muscle recruitment.  Cardiovascular: Sinus tachycardia, s1s2 no r/g/m. No JVD  Abdomen: thin, flat, normoactive bowels  sounds x4  Extremities: Very thin with appreciable muscle wasting. No cyanosis or clubbing No edema  Neuro: Awake, alert.  Moves RUE BLE spontaneously. Follows commands to move lower extremities. Nods/shakes head.  Skin: pale, clean, dry. Scattered ecchymosis LUE. No rash   Resolved Hospital  Problem list   Leukocytosis  Assessment & Plan:   Acute hypoxemic respiratory failure secondary to the inability to protect his airway from acute metabolic encephalopathy Chronic EtOH abuse CXR: evidence of emphysema. No focal infiltrates   - Initially presented as code stroke evaluation.  Please see documentation by neurology colleagues. -Concern for seizure/seizure-like activity due to EtOH however EEG no seizure activity -Concern for toxidrome however UDS negative  P - Weaning vent this morning - patient has been established as DNR, previous DNR/I - Extubate this morning. Plan discussed previously between Dr. Tonia Brooms (PCCM) patient's healthcare proxy (sister Hilda Lias) and between [facility] NP Maralyn Sago and sister Hilda Lias. I personally verified this plan with Hilda Lias this morning -After extubation, DNR/DNI -PRN albuterol   Left Vertebral Artery Occlusion  MRI reveals R cerebellum and medial L temporal lobe acute or subacute ischemia, with advanced atrophy of white matter -neurology following, appreciate recommendations  -starting keppra today per neurology   Presenting leukocytosis, lactic acidosis, altered mental status, improving  Possible sepsis -Unclear etiology -Cefepime started empirically, continue   Chronic alcohol abuse - Continue thiamine folate -- start CIWA   Electrolyte abnormalities hypocalcemia, hypomagnesemia  -monitor BMP, mag phos and replace PRN  Hyponatremia, resolved  - likely from malnutrition and alcohol use   Chronic systolic heart failure with reduced EF  Prior ejection fraction 25% History of non-STEMI -Continue supportive care -Attempt to maintain euvolemia  Protein calorie malnutrition, severe    Chronic illness, EtOH abuse  - support care   Elevated AST   - likely from alcohol use  Monitor PRN   Goals of care:  Palliative care following appreciate recs Weaning today, anticipate 1-way extubation, pending conversation with family Previous DNR/DNI,  maintain DNR. If extubated, DNR/I Do not excalate care   Best practice:  Diet: NPO Pain/Anxiety/Delirium protocol (if indicated): CIWA  VAP protocol (if indicated): Yes DVT prophylaxis: heparin  GI prophylaxis: H2B  Glucose control: CBGs Mobility: BR Code Status: DNR Family Communication: Spoke with sister, Hilda Lias, this morning. Updated on vent weaning, MRI results. Confirmed plan to extubate and make patient DNR/I upon extubation  Disposition: ICU   Labs   CBC: Recent Labs  Lab 2018-08-14 1327 08/14/18 1532 07/15/2018 0422 08/08/2018 0541  WBC 11.4*  --   --  9.8  NEUTROABS 10.5*  --   --   --   HGB 10.8* 10.9* 9.2* 9.0*  HCT 36.3* 32.0* 27.0* 30.2*  MCV 95.8  --   --  96.5  PLT 121*  --   --  122*    Basic Metabolic Panel: Recent Labs  Lab 08/14/2018 1327 08/14/2018 1329 14-Aug-2018 1532 08/11/2018 0422 07/15/2018 0541  NA 134*  --  134* 139 140  K 4.3  --  3.3* 3.6 3.5  CL 96*  --   --   --  108  CO2 14*  --   --   --  16*  GLUCOSE 82  --   --   --  113*  BUN <5*  --   --   --  6*  CREATININE 1.36* 1.00  --   --  1.24  CALCIUM 8.4*  --   --   --  6.5*  MG  --   --   --   --  1.7  PHOS  --   --   --   --  4.5   GFR: Estimated Creatinine Clearance: 38.1 mL/min (by C-G formula based on SCr of 1.24 mg/dL). Recent Labs  Lab 07/23/2018 1327 08/08/2018 1615 07/23/2018 2004 08/06/2018 2239 08/07/18 0541  WBC 11.4*  --   --   --  9.8  LATICACIDVEN  --  5.7* 5.0* 3.2*  --     Liver Function Tests: Recent Labs  Lab 07/22/2018 1327  AST 123*  ALT 20  ALKPHOS 109  BILITOT 0.8  PROT 6.1*  ALBUMIN 3.0*   No results for input(s): LIPASE, AMYLASE in the last 168 hours. Recent Labs  Lab 07/16/2018 1440  AMMONIA 23    ABG    Component Value Date/Time   PHART 7.338 (L) 08-07-18 0422   PCO2ART 28.0 (L) 07-Aug-2018 0422   PO2ART 132.0 (H) 08/07/18 0422   HCO3 15.0 (L) August 07, 2018 0422   TCO2 16 (L) 08/07/18 0422   ACIDBASEDEF 10.0 (H) 08-07-2018 0422   O2SAT 99.0  07-Aug-2018 0422     Coagulation Profile: Recent Labs  Lab 07/17/2018 1327  INR 1.6*    Cardiac Enzymes: No results for input(s): CKTOTAL, CKMB, CKMBINDEX, TROPONINI in the last 168 hours.  HbA1C: Hgb A1c MFr Bld  Date/Time Value Ref Range Status  04/01/2017 12:43 PM 4.8 4.8 - 5.6 % Final    Comment:    (NOTE)         Prediabetes: 5.7 - 6.4         Diabetes: >6.4         Glycemic control for adults with diabetes: <7.0   10/12/2016 04:53 AM 5.3 4.8 - 5.6 % Final    Comment:    (NOTE)         Pre-diabetes: 5.7 - 6.4         Diabetes: >6.4         Glycemic control for adults with diabetes: <7.0     CBG: Recent Labs  Lab 07/26/2018 1320 08/06/2018 2348 08-07-2018 0016 08-07-2018 0328  GLUCAP 102* 62* 122* 75    Critical Care Time: 45 minutes   Tessie Fass MSN, AGACNP-BC [facility] [phone] If no answer, [phone] 2018/08/07, 8:01 AM

## 2018-08-14 NOTE — Progress Notes (Addendum)
Noted pt to be in cardiac rhythm v-fib at 03-Aug-2307. Nursing came into the room to physically check on the pt to find him unresponsive and pulseless. No efforts of resuscitation were started as pt code status is/was DNR. E-Link/CCM made aware. Confirmed pt time of death 2317/08/02 with 2 RN and absence of S1/S2 via auscultation.  Will notify NOK.

## 2018-08-14 NOTE — Procedures (Signed)
Extubation Procedure Note  Patient Details:   Name: Caleb Carpenter DOB: 1956/03/04 MRN: [id number]   Airway Documentation:  Airway 7.5 mm (Active)  Secured at (cm) 24 cm 07/22/2018  7:35 AM  Measured From Lips 07/23/2018  7:35 AM  Secured Location Right 07/31/2018  7:35 AM  Secured By [employer] 07/29/2018  7:35 AM  Tube Holder Repositioned Yes 08/09/2018  7:35 AM  Cuff Pressure (cm H2O) 28 cm H2O 07/18/2018  7:35 AM  Site Condition Dry 07/24/2018  7:35 AM   Vent end date: (not recorded) Vent end time: (not recorded)   Evaluation  O2 sats: stable throughout Complications: No apparent complications Patient did tolerate procedure well. Bilateral Breath Sounds: Clear, Diminished   Yes   Patient was extubated to a 4L [place] without any complications, dyspnea or stridor noted. Patient had a positive cuff leak.  Carlynn Spry 08/10/2018, 08:59 AM

## 2018-08-14 NOTE — Evaluation (Signed)
Physical Therapy Evaluation Patient Details Name: Caleb Carpenter MRN: [id number] DOB: 01/26/1956 Today's Date: 07/29/2018   History of Present Illness  63 year old man with past medical history of COPD, HTN, Etoh abuse, presented as a possible LVO positive stroke yesterday when he was found unresponsive only responding to pain by roommate/bystander. MRI showing small areas left mesial temporal lobe DWI changes-likely seizure related.  Intubated 31-Aug-2018-08/02/17.   Clinical Impression  Orders received for PT evaluation. Patient demonstrates deficits in functional mobility as indicated below. Will benefit from continued skilled PT to address deficits and maximize function. Will see as indicated and progress as tolerated.  Patient known to this services, feel patient will need SNF upon acute discharge which is consistent with recommendations of the past.    Follow Up Recommendations SNF;Supervision/Assistance - 24 hour    Equipment Recommendations  (TBD)    Recommendations for Other Services       Precautions / Restrictions Precautions Precautions: Fall      Mobility  Bed Mobility Overal bed mobility: Needs Assistance Bed Mobility: Supine to Sit;Sit to Supine     Supine to sit: Max assist;+2 for physical assistance Sit to supine: Min assist;+2 for physical assistance   General bed mobility comments: Max A +2 for bringing hips to EOB and elevating trunk. Pt requiring Min A to manage LLE over EOB in return to supine  Transfers Overall transfer level: Needs assistance Equipment used: (2 person face to face) Transfers: Sit to/from [employer] Sit to Stand: Max assist;+2 physical assistance   Squat pivot transfers: Max assist;+2 physical assistance;+2 safety/equipment     General transfer comment: Max A +2 power up into standing. Pt with increased fear of falling requiring +2 for stability and to maintian upright posture. Blocking bil knees  Ambulation/Gait              General Gait Details: unable to ambulate  Stairs            Wheelchair Mobility    Modified Rankin (Stroke Patients Only) Modified Rankin (Stroke Patients Only) Pre-Morbid Rankin Score: No symptoms Modified Rankin: Moderately severe disability     Balance Overall balance assessment: Needs assistance Sitting-balance support: No upper extremity supported;Feet supported Sitting balance-Leahy Scale: Fair     Standing balance support: Bilateral upper extremity supported;During functional activity Standing balance-Leahy Scale: Poor Standing balance comment: Requiring physical A for standing                             Pertinent Vitals/Pain Pain Assessment: Faces Faces Pain Scale: Hurts even more Pain Location: LLE Pain Descriptors / Indicators: Discomfort;Grimacing Pain Intervention(s): Monitored during session;Limited activity within patient's tolerance;Repositioned    Home Living Family/patient expects to be discharged to:: Private residence Living Arrangements: Non-relatives/Friends(Roommate on [place])               Additional Comments: He reports he lives with "Jonny:    Prior Function Level of Independence: Needs assistance         Comments: Pt not answering questions about home and PLOF     Hand Dominance   Dominant Hand: Right    Extremity/Trunk Assessment   Upper Extremity Assessment Upper Extremity Assessment: Generalized weakness;LUE deficits/detail LUE Deficits / Details: Decreased AROM and use of LUE. Difficult to assess due to cognition. Noting weakness LUE Coordination: decreased gross motor    Lower Extremity Assessment Lower Extremity Assessment: Generalized weakness;LLE deficits/detail LLE Deficits / Details: PAtient  with modest assymetrical weakness L compared to right LE LLE Coordination: decreased fine motor;decreased gross motor    Cervical / Trunk Assessment Cervical / Trunk Assessment: Other  exceptions Cervical / Trunk Exceptions: Decreased trunk strength  Communication   Communication: HOH  Cognition Arousal/Alertness: Awake/alert Behavior During Therapy: Flat affect;Impulsive Overall Cognitive Status: History of cognitive impairments - at baseline                                 General Comments: Baseline dementia. Poor awareness and following of tasks. Requiring Max cues for safety and poor participation. perseverating on "using bedpan"      General Comments General comments (skin integrity, edema, etc.): VSS. 100% SpO2 on 4L. BP 90/71. HR 121 with activity    Exercises     Assessment/Plan    PT Assessment Patient needs continued PT services  PT Problem List Decreased strength;Decreased activity tolerance;Decreased balance;Decreased mobility;Decreased coordination;Decreased cognition;Decreased safety awareness;Cardiopulmonary status limiting activity;Pain       PT Treatment Interventions DME instruction;Gait training;Stair training;Functional mobility training;Therapeutic activities;Therapeutic exercise;Balance training;Patient/family education;Neuromuscular re-education;Cognitive remediation    PT Goals (Current goals can be found in the Care Plan section)  Acute Rehab PT Goals Patient Stated Goal: "I need to crap" PT Goal Formulation: With patient Time For Goal Achievement: 08/17/18 Potential to Achieve Goals: Good    Frequency Min 3X/week   Barriers to discharge Decreased caregiver support      Co-evaluation PT/OT/SLP Co-Evaluation/Treatment: Yes Reason for Co-Treatment: For patient/therapist safety;To address functional/ADL transfers;Complexity of the patient's impairments (multi-system involvement) PT goals addressed during session: Mobility/safety with mobility OT goals addressed during session: ADL's and self-care       AM-PAC PT "6 Clicks" Mobility  Outcome Measure Help needed turning from your back to your side while in a flat  bed without using bedrails?: A Lot Help needed moving from lying on your back to sitting on the side of a flat bed without using bedrails?: A Lot Help needed moving to and from a bed to a chair (including a wheelchair)?: A Lot Help needed standing up from a chair using your arms (e.g., wheelchair or bedside chair)?: A Lot Help needed to walk in hospital room?: A Lot Help needed climbing 3-5 steps with a railing? : Total 6 Click Score: 11    End of Session Equipment Utilized During Treatment: Oxygen Activity Tolerance: Patient tolerated treatment well Patient left: in bed;with call bell/phone within reach;with bed alarm set;with SCD's reapplied Nurse Communication: Mobility status PT Visit Diagnosis: Other symptoms and signs involving the nervous system (R29.898);Difficulty in walking, not elsewhere classified (R26.2);Muscle weakness (generalized) (M62.81)    Time: 0100-7121 PT Time Calculation (min) (ACUTE ONLY): 32 min   Charges:   PT Evaluation $PT Eval Moderate Complexity: 1 Mod          Charlotte Crumb, PT DPT  Board Certified Neurologic Specialist Acute Rehabilitation Services Pager [phone] Office [phone]   Fabio Asa 17-Aug-2018, 10:53 AM

## 2018-08-14 NOTE — Progress Notes (Signed)
Daily Progress Note   Patient Name: Caleb Carpenter       Date: 07/15/2018 DOB: Jan 30, 1956  Age: 63 y.o. MRN#: [id number] Attending Physician: Josephine Igo, DO Primary Care Physician: Evelene Croon, MD Admit Date: 07/16/2018  Reason for Consultation/Follow-up: Establishing goals of care and Psychosocial/spiritual support  Subjective: Discussed with RN.  Examined patient.  Spoke with Hilda Lias.  Caleb Carpenter was living with Bethann Berkshire (Marie's brother in law).  Caleb Carpenter used to stay in his bedroom at [employer].  He ate very little.  He left the bedroom very infrequently.  I spoke for approximately 20 min on the phone with Hilda Lias (his sister and designated health care Management consultant).  We discussed the physical therapy results.  She is hopeful that given a little time Caleb Carpenter will be able to stand and walk a few steps.  She re-confirmed DNR/DNI.      Assessment: Patient awake and confused.  Repeatedly asking me to "Come on in the house".   He is in a hard cervical collar.      Patient Profile/HPI:  63 y.o. male  with past medical history of ischemic cardiomyopathy, systolic heart failure with an EF of 20 to 25%, alcohol use disorder, dementia, history of non-STEMI July 2019 admitted on 08/07/2018 after being found down by his roommate.  EMS was called.  Patient was unable to follow commands, unresponsive with agonal respirations.  He was intubated in the emergency room to protect his airway   Length of Stay: 1  Current Medications: Scheduled Meds:  . famotidine  20 mg Oral BID  . folic acid  1 mg Intravenous Daily  . heparin  5,000 Units Subcutaneous Q8H  . multivitamin with minerals  1 tablet Oral Daily  . sodium chloride flush  3 mL Intravenous Once    Continuous Infusions: . sodium chloride  100 mL/hr (07/16/2018 1900)  . sodium chloride 10 mL/hr at 08/10/2018 1900  . ceFEPime (MAXIPIME) IV 2 g (08/10/2018 1200)  . dexmedetomidine (PRECEDEX) IV infusion 0.2 mcg/kg/hr (08/10/2018 0854)  . fentaNYL infusion INTRAVENOUS 25 mcg/hr (08/06/2018 1900)  . levETIRAcetam    . thiamine injection 250 mg (08/10/2018 1922)    PRN Meds: sodium chloride, acetaminophen, albuterol, fentaNYL, LORazepam  Physical Exam        Thin, frail, awake, confused in hard collar.  CV tachy resp no distress Skin extensive bruising on chest, arms Ext.  Not moving left arm.  Vital Signs: BP 98/62   Pulse (!) 116   Temp 98.4 F (36.9 C)   Resp 16   Ht 5\' 5"  (1.651 m)   Wt 44.8 kg   SpO2 100%   BMI 16.44 kg/m  SpO2: SpO2: 100 % O2 Device: O2 Device: Nasal Cannula O2 Flow Rate: O2 Flow Rate (L/min): 4 L/min  Intake/output summary:   Intake/Output Summary (Last 24 hours) at Aug 28, 2018 1237 Last data filed at 2018-08-28 0600 Gross per 24 hour  Intake 2586.99 ml  Output 1000 ml  Net 1586.99 ml   LBM:   Baseline Weight: Weight: 43 kg(per previous visit) Most recent weight: Weight: 44.8 kg       Palliative Assessment/Data: 40%    Patient Active Problem List   Diagnosis Date Noted  . Pressure injury of skin 08/28/18  . Acute encephalopathy 08/01/2018  . Trauma   . Palliative care by specialist   . Acute exacerbation of chronic obstructive pulmonary disease (COPD) (HCC) 06/26/2018  . Insomnia 12/15/2017  . Chewing tobacco use 12/15/2017  . 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 COPD with asthma (HCC) 11/22/2017  . Dyslipidemia 11/22/2017  . Evaluation by psychiatric service required   . 3-vessel coronary artery disease 11/03/2017  . DNR (do not resuscitate) 11/03/2017  . Ischemic cardiomyopathy 11/03/2017  . Protein-calorie malnutrition, severe 10/30/2017  . Goals of care, counseling/discussion   . STEMI (ST elevation myocardial  infarction) (HCC) 10/28/2017  . Syncope 04/01/2017  . Dementia (HCC) 11/06/2016  . Chronic systolic heart failure (HCC) 11/01/2016  . General weakness 09/03/2015  . HTN (hypertension) 09/02/2015  . Alcohol abuse 06/23/2015  . Gastritis     Palliative Care Plan    Recommendations/Plan:  Full scope treatment with the limitation of DNR/DNI  Anticipate that patient will go home (to [name]'s house) with hospice to support.  Code Status:  DNR  Prognosis:   < 6 months given EF 20%, NSTEMI, dementia  Discharge Planning:  To friends home with hospice  Care plan was discussed with Sister Hilda Lias.  Thank you for allowing the Palliative Medicine Team to assist in the care of this patient.  Total time spent:  35 min.     Greater than 50%  of this time was spent counseling and coordinating care related to the above assessment and plan.  Norvel Richards, PA-C Palliative Medicine  Please contact Palliative MedicineTeam phone at [phone] for questions and concerns between 7 am - 7 pm.   Please see AMION for individual provider pager numbers.

## 2018-08-14 NOTE — Progress Notes (Signed)
S/O: Called to evaluate patient for decreased movement of the LUE and LLE. Dr. Marlis Edelson note did document decreased movement on the left as well. Unknown time of onset or LKN. CTA on admission did not show LVO. On CT head, a small right occipital infarct was seen, new from 03/2018, which was of indeterminate acuity though favored to be subacute to chronic.  BP (!) 85/61   Pulse (!) 117   Temp 99.9 F (37.7 C) (Oral)   Resp 15   Ht 5\' 5"  (1.651 m)   Wt 45.3 kg   SpO2 100%   BMI 16.62 kg/m   Examination: General: Intubated. NAD.  Ext: Warm and well perfused  Ment: Eyes open. Not following commands except to raise RUE. Does not answer questions with head nodding or shaking. Localizes to sternal rub with BUE, more briskly on the right.  CN: EOMI with saccadic pursuits. PERRL. Face grossly symmetric in the context of intubation. Motor/Sensory:  RUE 4/5 with slow movement.  LUE: 3-4/5 with slow movement and increased tone.  RLE: 4/5 withdrawal to noxious LLE: 2-3/5 withdrawal to noxious. Increased tone.  A/R: 63 year old male presenting with AMS and decreased left sided movement relative to right. DDx on admission included toxic/metabolic encephalopathy.  1. To further evaluate asymmetry on motor exam, an MRI brain has been ordered.  2. Continue supportive care.   40 minutes spent in the neurological evaluation and management of this critically ill patient.   Electronically signed: Dr. Caryl Pina

## 2018-08-14 NOTE — Progress Notes (Signed)
Patient transported from [id number] to MRI and back with no complications. 

## 2018-08-14 DEATH — deceased

## 2018-09-14 NOTE — Death Summary Note (Signed)
Caleb Carpenter was a 63 y.o. male admitted on 08/13/2018 with altered mental status.  He was intubated for airway protection.  Code Stroke called and he was seen by neurology.  Initial neuro imaging was unrevealing for a cause.  Palliative care consulted.  Determined patient to be DNR.  MRI brain showed areas of ischemic stroke.  He was extubated 08-08-2018.  He developed ventricular fibrillation on 2018/08/08 and expired at 2319.   Final diagnoses: Ventricular fibrillation Acute/subacute Rt cerebellar and medial Lt temporal lobe ischemic strokes with Lt vertebral artery occlusion New onset seizure Acute metabolic encephalopathy Acute hypoxic respiratory failure with compromised airway Alcohol abuse Lactic acidosis Chronic systolic CHF Hyponatremia Severe protein calorie malnutrition COPD with emphysema Hx of hypertension Elevated LFTs in setting of chronic alcohol use Anemia of critical illness and chronic disease Thrombocytopenia from alcohol abuse  Coralyn Helling, MD [facility] Pulmonary/Critical Care 08/14/2018, 2:06 PM

## 2019-01-11 IMAGING — DX DG CHEST 1V PORT
1 series · 2 of 2 positions shown · non-contrast
Comparison: 10/26/2017, 04/30/2017.  CT 04/30/2017.

CLINICAL DATA: STEMI. Dry cough. Shortness of breath. Chest pain.

EXAM:
PORTABLE CHEST 1 VIEW

[Series 1: chest · 0.14mm/px · 2 of 2 slices shown]
[im 1/2]
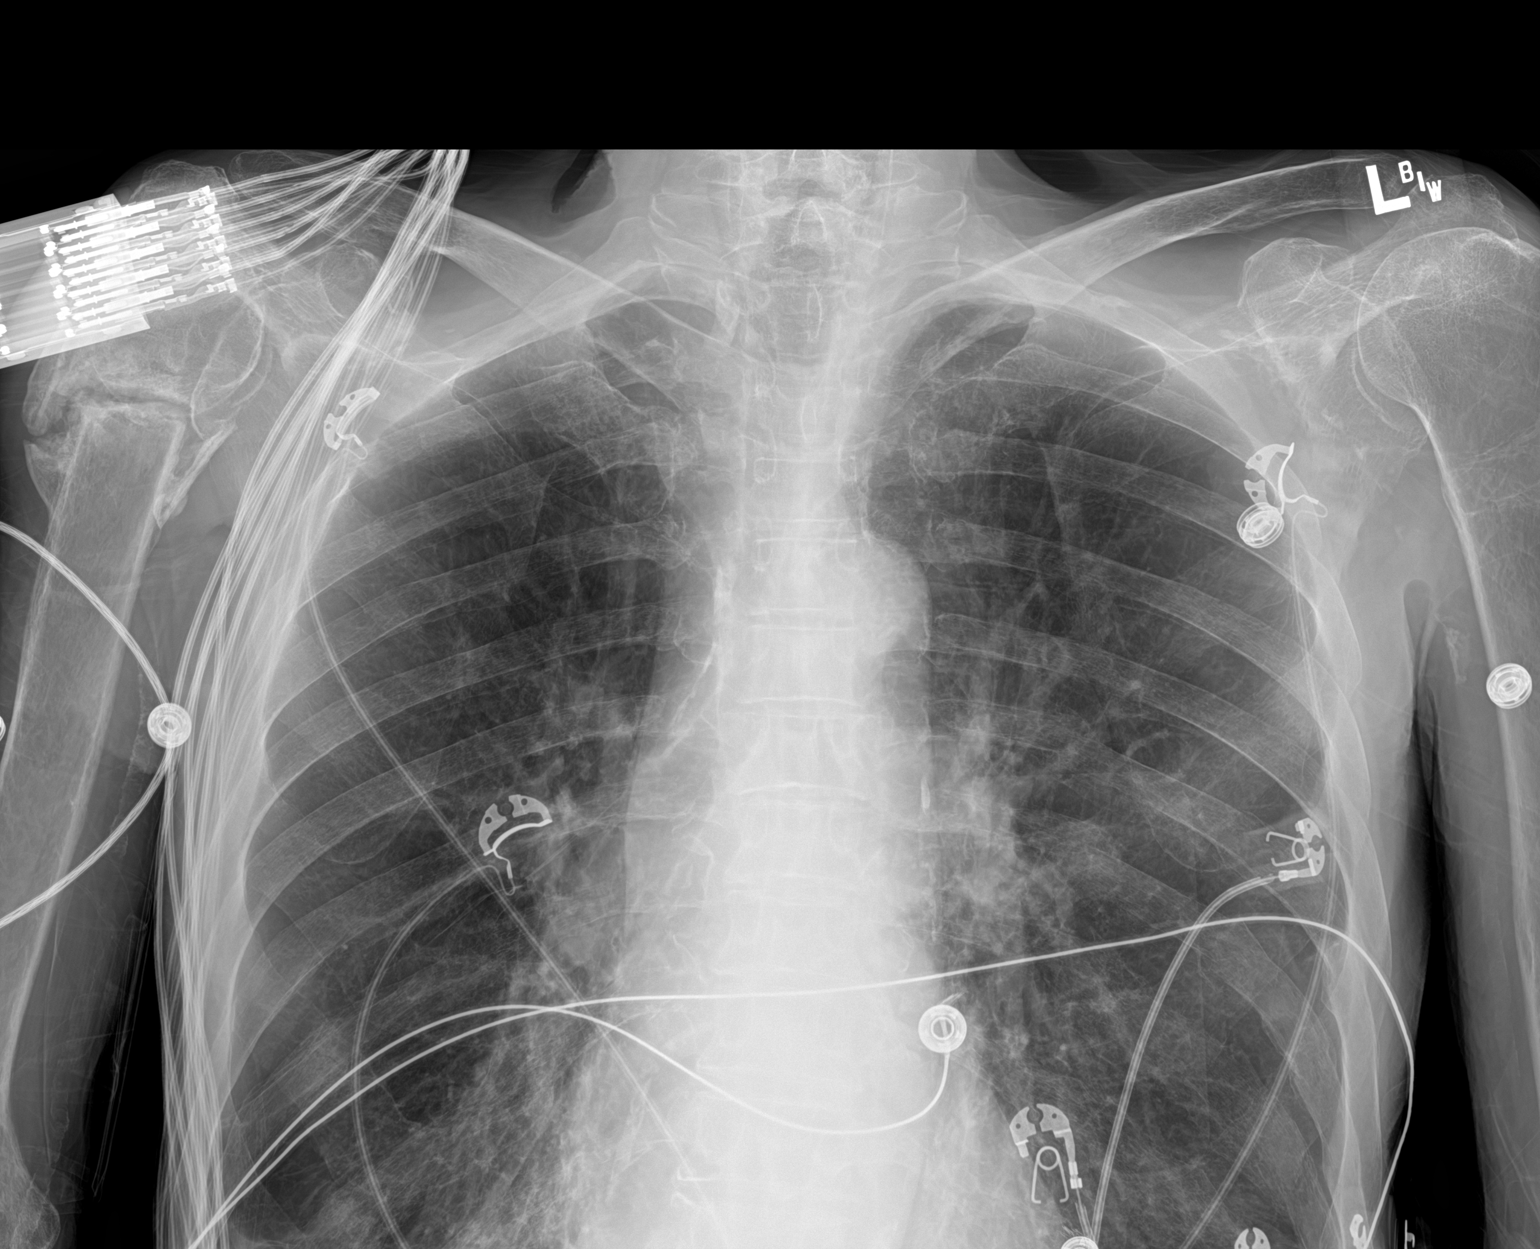
[im 2/2]
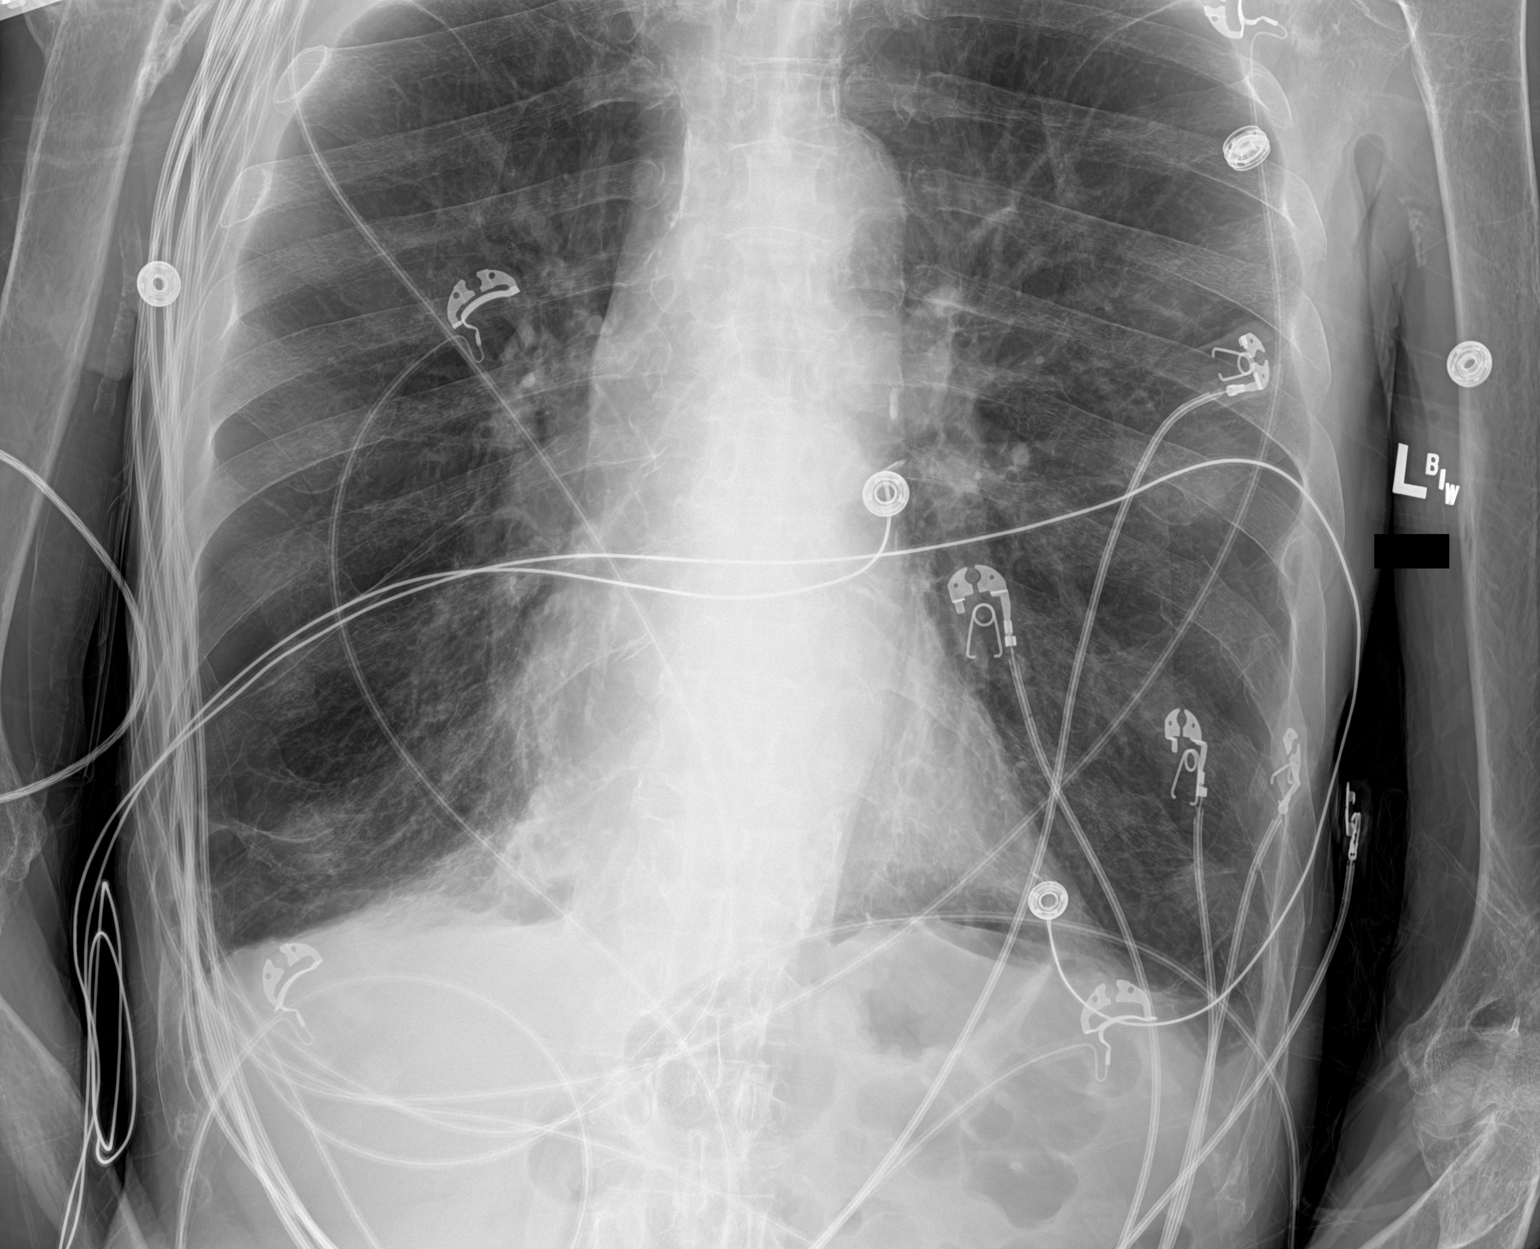

[2 of 2 positions shown; findings below may reference images not displayed]

FINDINGS: Mediastinum and hilar structures are normal. COPD. Bilateral
pleural-parenchymal thickening again noted consistent with scarring.
Reference is made to recent CT report of 04/30/2017 for discussion
of pulmonary nodules noted. Tiny left pleural effusion. No
pneumothorax. Borderline cardiomegaly with mild pulmonary venous
congestion. Carotid vascular and bilateral upper extremity
peripheral vascular calcification. Old right non healed femoral neck
fracture.
IMPRESSION: 1. COPD and pleuroparenchymal scarring. Small left pleural effusion
cannot be excluded.

2.  Cardiomegaly with mild pulmonary venous congestion.

3. Carotid and bilateral upper extremity peripheral vascular
disease.

## 2019-01-12 IMAGING — DX DG CHEST 1V PORT
1 series · 2 of 2 positions shown · non-contrast
Comparison: 10/28/2017

CLINICAL DATA: Shortness of breath.  COPD.

EXAM:
PORTABLE CHEST 1 VIEW

[Series 1: chest · 0.14mm/px · 2 of 2 slices shown]
[im 1/2]
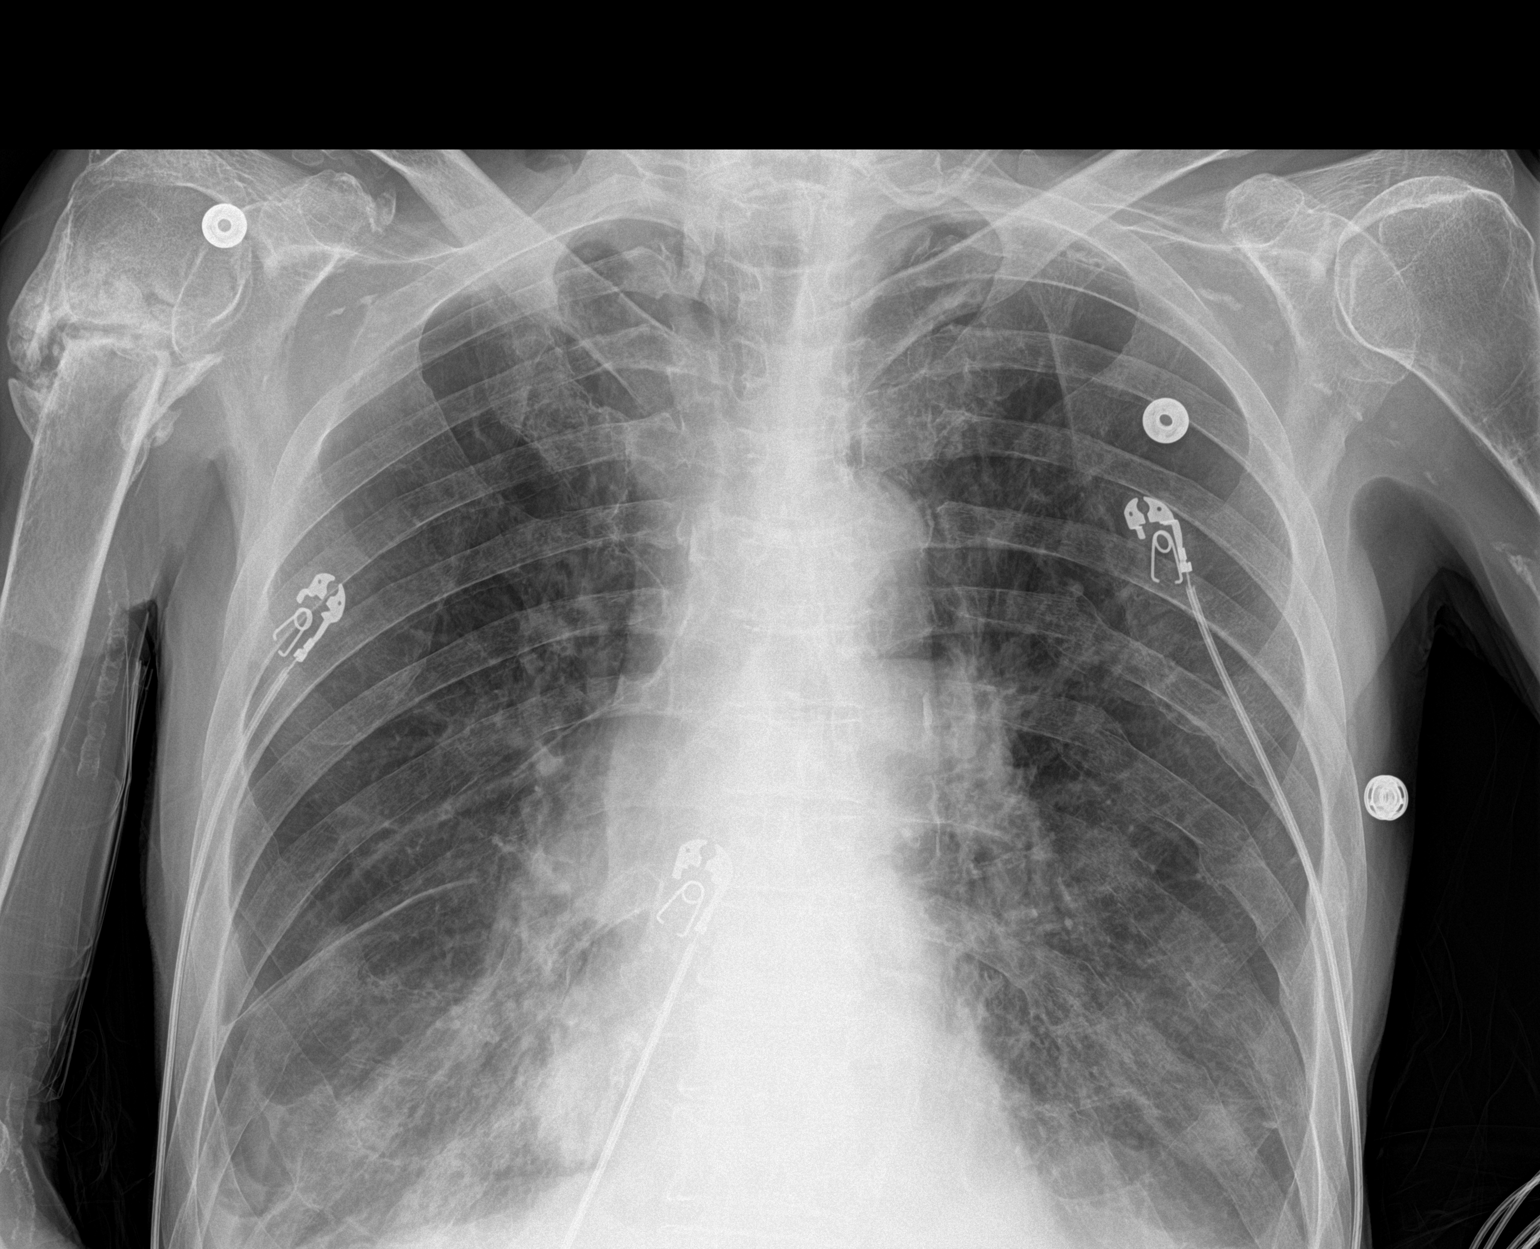
[im 2/2]
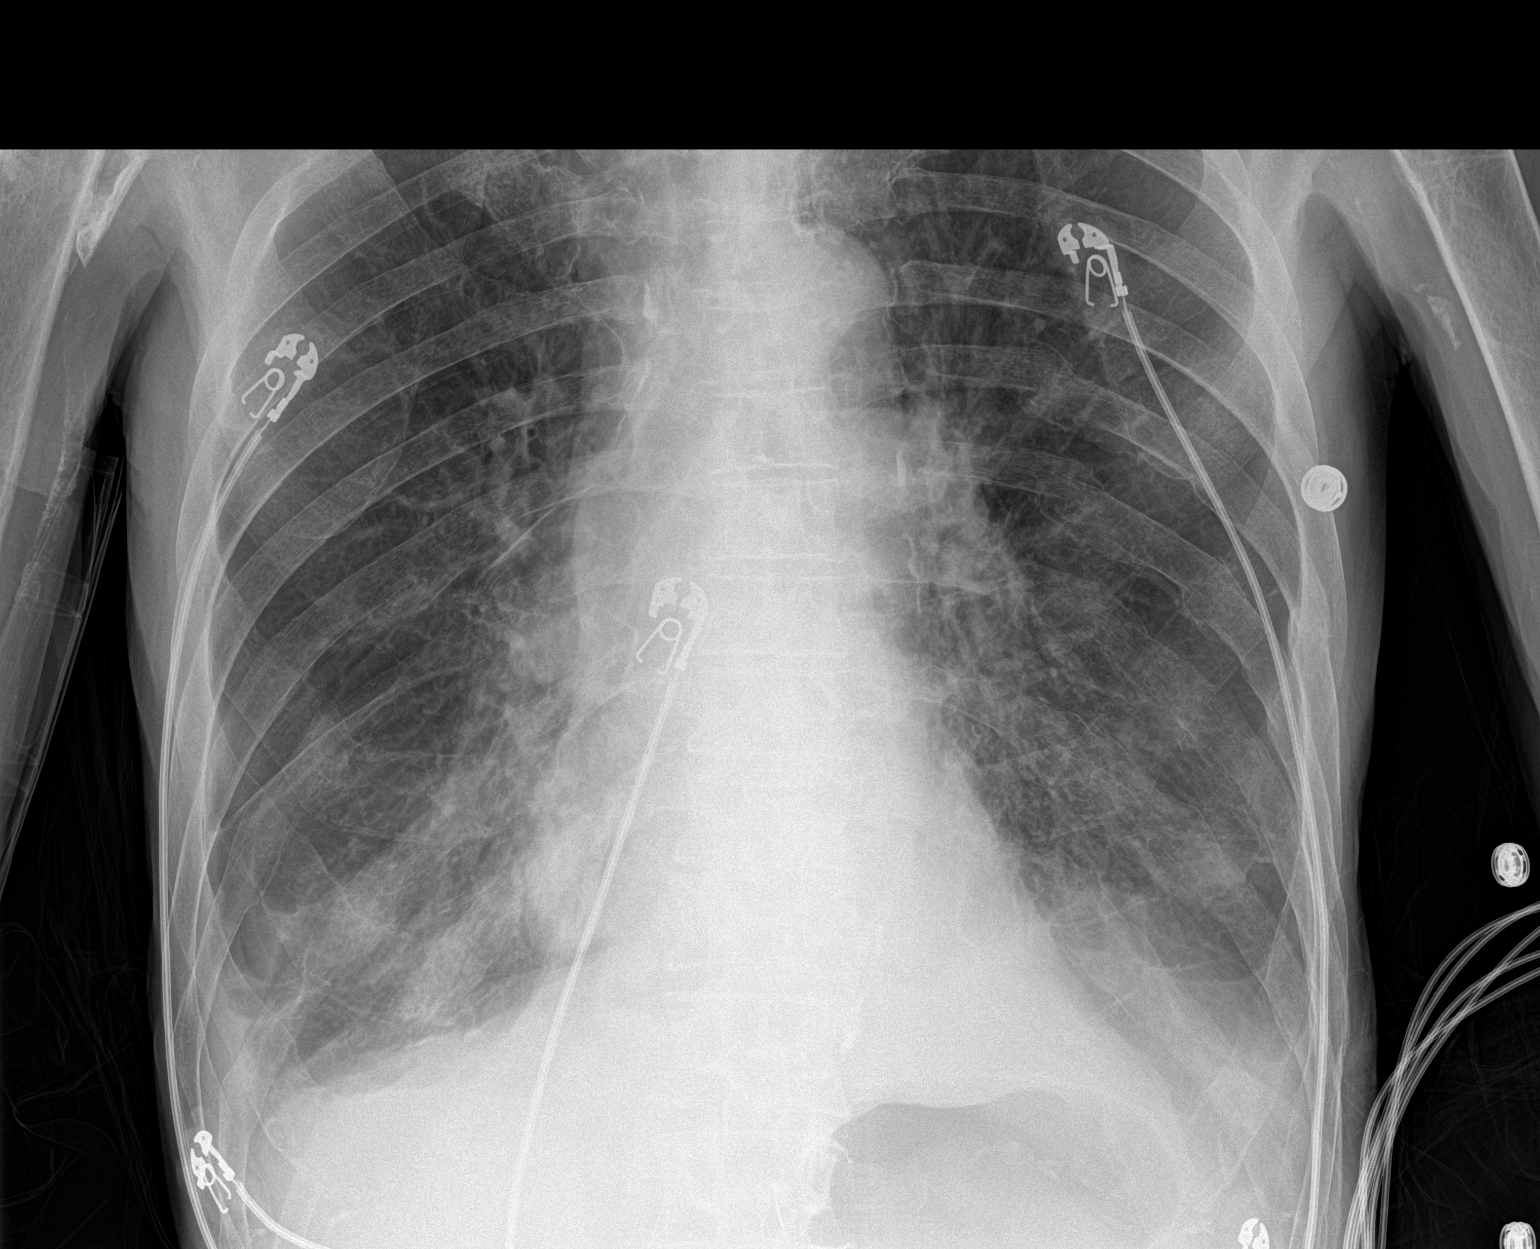

[2 of 2 positions shown; findings below may reference images not displayed]

FINDINGS: Heart size remains within normal limits.  Aortic atherosclerosis.

Pulmonary emphysema again demonstrated. Increased opacity is seen in
both lung bases, which may be due to superimposed pulmonary edema or
infection. New tiny right pleural effusion is seen.
IMPRESSION: Increased bilateral lower lung opacity superimposed on severe
emphysema. This is suspicious for superimposed pulmonary edema or
infection.

New tiny right pleural effusion.
